# Patient Record
Sex: Male | Born: 1980
Health system: Southern US, Community
[De-identification: ages and names within clinical notes are randomized; demographics above are authoritative.]

## PROBLEM LIST (undated history)

## (undated) DIAGNOSIS — M1990 Unspecified osteoarthritis, unspecified site: Secondary | ICD-10-CM

## (undated) DIAGNOSIS — R918 Other nonspecific abnormal finding of lung field: Secondary | ICD-10-CM

## (undated) DIAGNOSIS — I776 Arteritis, unspecified: Secondary | ICD-10-CM

## (undated) DIAGNOSIS — J329 Chronic sinusitis, unspecified: Secondary | ICD-10-CM

## (undated) DIAGNOSIS — T7840XA Allergy, unspecified, initial encounter: Secondary | ICD-10-CM

## (undated) DIAGNOSIS — M791 Myalgia, unspecified site: Secondary | ICD-10-CM

## (undated) DIAGNOSIS — J36 Peritonsillar abscess: Secondary | ICD-10-CM

## (undated) DIAGNOSIS — N411 Chronic prostatitis: Secondary | ICD-10-CM

## (undated) HISTORY — DX: Allergy, unspecified, initial encounter: T78.40XA

## (undated) HISTORY — PX: OTHER SURGICAL HISTORY: SHX169

## (undated) HISTORY — DX: Arteritis, unspecified: I77.6

## (undated) HISTORY — DX: Chronic sinusitis, unspecified: J32.9

## (undated) HISTORY — DX: Unspecified osteoarthritis, unspecified site: M19.90

## (undated) HISTORY — DX: Chronic prostatitis: N41.1

## (undated) HISTORY — DX: Myalgia, unspecified site: M79.10

## (undated) HISTORY — DX: Peritonsillar abscess: J36

## (undated) HISTORY — DX: Other nonspecific abnormal finding of lung field: R91.8

## (undated) HISTORY — PX: TYMPANOPLASTY: SHX33

---

## 2009-09-25 ENCOUNTER — Ambulatory Visit: Payer: Self-pay | Admitting: Family Medicine

## 2009-09-25 DIAGNOSIS — J329 Chronic sinusitis, unspecified: Secondary | ICD-10-CM | POA: Insufficient documentation

## 2009-10-21 HISTORY — PX: BRONCHOSCOPY: SUR163

## 2009-12-18 ENCOUNTER — Ambulatory Visit: Payer: Self-pay | Admitting: Family Medicine

## 2009-12-18 DIAGNOSIS — J018 Other acute sinusitis: Secondary | ICD-10-CM

## 2009-12-27 ENCOUNTER — Telehealth: Payer: Self-pay | Admitting: Family Medicine

## 2010-01-05 ENCOUNTER — Encounter: Payer: Self-pay | Admitting: Internal Medicine

## 2010-01-22 ENCOUNTER — Ambulatory Visit: Payer: Self-pay | Admitting: Family Medicine

## 2010-01-25 LAB — CONVERTED CEMR LAB: IgE (Immunoglobulin E), Serum: 75.5 [IU]/mL

## 2010-02-12 ENCOUNTER — Telehealth: Payer: Self-pay | Admitting: Family Medicine

## 2010-02-15 ENCOUNTER — Telehealth: Payer: Self-pay | Admitting: Family Medicine

## 2010-03-01 ENCOUNTER — Ambulatory Visit: Payer: Self-pay | Admitting: Family Medicine

## 2010-03-01 DIAGNOSIS — J9801 Acute bronchospasm: Secondary | ICD-10-CM

## 2010-04-09 ENCOUNTER — Ambulatory Visit: Payer: Self-pay | Admitting: Internal Medicine

## 2010-04-09 ENCOUNTER — Telehealth (INDEPENDENT_AMBULATORY_CARE_PROVIDER_SITE_OTHER): Payer: Self-pay | Admitting: *Deleted

## 2010-04-09 DIAGNOSIS — J45909 Unspecified asthma, uncomplicated: Secondary | ICD-10-CM

## 2010-04-11 ENCOUNTER — Encounter: Payer: Self-pay | Admitting: Internal Medicine

## 2010-04-15 DIAGNOSIS — H6980 Other specified disorders of Eustachian tube, unspecified ear: Secondary | ICD-10-CM

## 2010-04-16 LAB — CONVERTED CEMR LAB
Basophils Absolute: 0 10*3/uL (ref 0.0–0.1)
Basophils Relative: 0.5 % (ref 0.0–3.0)
Eosinophils Absolute: 0.2 10*3/uL (ref 0.0–0.7)
Eosinophils Relative: 3.6 % (ref 0.0–5.0)
Hemoglobin: 16.1 g/dL (ref 13.0–17.0)
Lymphocytes Relative: 22.6 % (ref 12.0–46.0)
MCHC: 35.1 g/dL (ref 30.0–36.0)
Monocytes Relative: 7.4 % (ref 3.0–12.0)
Neutro Abs: 4.1 10*3/uL (ref 1.4–7.7)
Neutrophils Relative %: 65.9 % (ref 43.0–77.0)
RBC: 5.01 M/uL (ref 4.22–5.81)
WBC: 6.3 10*3/uL (ref 4.5–10.5)

## 2010-04-26 ENCOUNTER — Telehealth (INDEPENDENT_AMBULATORY_CARE_PROVIDER_SITE_OTHER): Payer: Self-pay | Admitting: *Deleted

## 2010-05-09 ENCOUNTER — Ambulatory Visit: Payer: Self-pay | Admitting: Internal Medicine

## 2010-05-25 ENCOUNTER — Telehealth (INDEPENDENT_AMBULATORY_CARE_PROVIDER_SITE_OTHER): Payer: Self-pay | Admitting: *Deleted

## 2010-05-25 ENCOUNTER — Ambulatory Visit: Payer: Self-pay | Admitting: Internal Medicine

## 2010-05-29 ENCOUNTER — Telehealth: Payer: Self-pay | Admitting: Internal Medicine

## 2010-05-29 ENCOUNTER — Ambulatory Visit: Payer: Self-pay | Admitting: Cardiology

## 2010-05-29 DIAGNOSIS — R930 Abnormal findings on diagnostic imaging of skull and head, not elsewhere classified: Secondary | ICD-10-CM

## 2010-05-29 LAB — CONVERTED CEMR LAB
Basophils Relative: 0.6 % (ref 0.0–3.0)
Calcium: 8.9 mg/dL (ref 8.4–10.5)
Creatinine, Ser: 0.9 mg/dL (ref 0.4–1.5)
Eosinophils Relative: 3.5 % (ref 0.0–5.0)
GFR calc non Af Amer: 101.27 mL/min (ref >60)
HCT: 44.4 % (ref 39.0–52.0)
Lymphocytes Relative: 15.6 % (ref 12.0–46.0)
Lymphs Abs: 1.2 10*3/uL (ref 0.7–4.0)
MCV: 90.5 fL (ref 78.0–100.0)
Monocytes Absolute: 0.5 10*3/uL (ref 0.1–1.0)
Neutrophils Relative %: 73.6 % (ref 43.0–77.0)
Potassium: 4.1 meq/L (ref 3.5–5.1)

## 2010-05-30 ENCOUNTER — Ambulatory Visit: Payer: Self-pay | Admitting: Internal Medicine

## 2010-05-31 ENCOUNTER — Telehealth: Payer: Self-pay | Admitting: Internal Medicine

## 2010-06-01 ENCOUNTER — Ambulatory Visit: Admission: RE | Admit: 2010-06-01 | Discharge: 2010-06-01 | Payer: Self-pay | Admitting: Internal Medicine

## 2010-06-01 ENCOUNTER — Ambulatory Visit: Payer: Self-pay | Admitting: Internal Medicine

## 2010-06-01 ENCOUNTER — Telehealth: Payer: Self-pay | Admitting: Internal Medicine

## 2010-06-08 ENCOUNTER — Telehealth: Payer: Self-pay | Admitting: Internal Medicine

## 2010-06-08 ENCOUNTER — Ambulatory Visit: Payer: Self-pay | Admitting: Internal Medicine

## 2010-06-08 DIAGNOSIS — R509 Fever, unspecified: Secondary | ICD-10-CM | POA: Insufficient documentation

## 2010-06-11 ENCOUNTER — Telehealth (INDEPENDENT_AMBULATORY_CARE_PROVIDER_SITE_OTHER): Payer: Self-pay | Admitting: *Deleted

## 2010-06-11 LAB — CONVERTED CEMR LAB
Basophils Absolute: 0 10*3/uL (ref 0.0–0.1)
Basophils Relative: 0.1 % (ref 0.0–3.0)
Bilirubin Urine: NEGATIVE
CO2: 36 meq/L — ABNORMAL HIGH (ref 19–32)
Chloride: 93 meq/L — ABNORMAL LOW (ref 96–112)
Creatinine, Ser: 0.9 mg/dL (ref 0.4–1.5)
Eosinophils Relative: 0.8 % (ref 0.0–5.0)
Glucose, Bld: 113 mg/dL — ABNORMAL HIGH (ref 70–99)
Hemoglobin: 16.4 g/dL (ref 13.0–17.0)
Lymphocytes Relative: 9.4 % — ABNORMAL LOW (ref 12.0–46.0)
Monocytes Relative: 3.1 % (ref 3.0–12.0)
Neutro Abs: 10.6 10*3/uL — ABNORMAL HIGH (ref 1.4–7.7)
Nitrite: NEGATIVE
RBC: 5.28 M/uL (ref 4.22–5.81)
RDW: 12.2 % (ref 11.5–14.6)
Total Protein, Urine: NEGATIVE mg/dL
WBC: 12.3 10*3/uL — ABNORMAL HIGH (ref 4.5–10.5)
pH: 7 (ref 5.0–8.0)

## 2010-06-21 ENCOUNTER — Telehealth (INDEPENDENT_AMBULATORY_CARE_PROVIDER_SITE_OTHER): Payer: Self-pay | Admitting: *Deleted

## 2010-06-21 ENCOUNTER — Encounter: Payer: Self-pay | Admitting: Family Medicine

## 2010-06-27 ENCOUNTER — Ambulatory Visit: Payer: Self-pay | Admitting: Internal Medicine

## 2010-06-27 DIAGNOSIS — K219 Gastro-esophageal reflux disease without esophagitis: Secondary | ICD-10-CM

## 2010-06-28 ENCOUNTER — Telehealth: Payer: Self-pay | Admitting: Internal Medicine

## 2010-06-28 DIAGNOSIS — IMO0001 Reserved for inherently not codable concepts without codable children: Secondary | ICD-10-CM | POA: Insufficient documentation

## 2010-06-28 LAB — CONVERTED CEMR LAB
ALT: 79 units/L — ABNORMAL HIGH (ref 0–53)
AST: 39 units/L — ABNORMAL HIGH (ref 0–37)
Albumin: 4 g/dL (ref 3.5–5.2)
Calcium: 9 mg/dL (ref 8.4–10.5)
Eosinophils Relative: 2.8 % (ref 0.0–5.0)
GFR calc non Af Amer: 150.35 mL/min (ref >60)
HCT: 44.2 % (ref 39.0–52.0)
Hemoglobin: 15.4 g/dL (ref 13.0–17.0)
Lymphs Abs: 1.3 10*3/uL (ref 0.7–4.0)
Monocytes Relative: 9.1 % (ref 3.0–12.0)
Neutro Abs: 6.6 10*3/uL (ref 1.4–7.7)
RDW: 14.2 % (ref 11.5–14.6)
Sodium: 140 meq/L (ref 135–145)
Total CK: 24 units/L (ref 7–232)
Total Protein: 7.7 g/dL (ref 6.0–8.3)
WBC: 8.9 10*3/uL (ref 4.5–10.5)

## 2010-07-02 ENCOUNTER — Telehealth (INDEPENDENT_AMBULATORY_CARE_PROVIDER_SITE_OTHER): Payer: Self-pay | Admitting: *Deleted

## 2010-07-03 LAB — CONVERTED CEMR LAB: Rhuematoid fact SerPl-aCnc: <20 intl units/mL (ref 0–20)

## 2010-07-05 ENCOUNTER — Encounter: Payer: Self-pay | Admitting: Internal Medicine

## 2010-07-06 ENCOUNTER — Telehealth: Payer: Self-pay | Admitting: Internal Medicine

## 2010-07-06 DIAGNOSIS — M3130 Wegener's granulomatosis without renal involvement: Secondary | ICD-10-CM | POA: Insufficient documentation

## 2010-07-06 DIAGNOSIS — M301 Polyarteritis with lung involvement [Churg-Strauss]: Secondary | ICD-10-CM | POA: Insufficient documentation

## 2010-07-24 ENCOUNTER — Ambulatory Visit: Payer: Self-pay | Admitting: Internal Medicine

## 2010-08-01 ENCOUNTER — Encounter: Payer: Self-pay | Admitting: Internal Medicine

## 2010-09-03 ENCOUNTER — Encounter: Payer: Self-pay | Admitting: Family Medicine

## 2010-10-01 ENCOUNTER — Encounter: Payer: Self-pay | Admitting: Family Medicine

## 2010-11-20 NOTE — Assessment & Plan Note (Signed)
Summary: Pulmonary/ acute ext ov with hfa teaching @ 75%   Copy to:  Nani Gasser Primary Provider/Referring Provider:  Nani Gasser MD  CC:  Dr. Maple Hudson patient. Here to discuss biopsy results. The patient c/o SOB with exertion and at rest and a dry cough..  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 sinus infection in February rx with prolonged abx thick green mucus no better except with prednsione but never took more than 5 days and variably stable for up to a month not consistent with any maint med x for allegra d.   Underwent FOB 8/12/11no dx, rx with avelox x 7 day and pred x 6 and minimally better while on it.  June 09, 2010 cc  SOB with exertion and at rest and a dry cough. Not taking any meds as directed by Dr Maple Hudson  only using allegra d  which he takes daily instead of as needed.  Low grade fever elminated on abx and pred, no fever now.   Pt denies any significant sore throat, dysphagia, itching, sneezing,  shaking  chills, sweats, pleuritic or exertional cp, hempoptysis  orthopnea pnd or leg swelling. Pt also denies any obvious fluctuation in symptoms with weather or environmental change or other alleviating or aggravating factors.   No rash, diarrhea, arthralgias.  LOST ABOUT 10 LBs since onset   Pt denies any use of  rescue therapy,  denies waking up needing it or having early am exacerbations of coughing/wheezing/ or dyspnea    Current Medications (verified): 1)  Proair Hfa 108 (90 Base) Mcg/act Aers (Albuterol Sulfate) .... Use With Aerochamber 2 Puffs Q 4 Hrs Prn 2)  Omnaris 50 Mcg/act Susp (Ciclesonide) .... 2 Sprays in Each Nostril Daily 3)  Allegra-D 24 Hour 180-240 Mg Xr24h-Tab (Fexofenadine-Pseudoephedrine) .... Take 1  Tablet By Mouth Once A Day 4)  Astepro 0.15 % Soln (Azelastine Hcl) .... One Spray in Each Nostril Once A Day in The Evening As Needed 5)  Symbicort 160-4.5 Mcg/act Aero (Budesonide-Formoterol Fumarate) .... 2 Puffs and Rinse Mouth, Twice Daily 6)  Singulair 10 Mg Tabs (Montelukast Sodium) .[name].. 1 Daily  Allergies (verified): 1)  Sulfa  Past History:  Past Medical History: Chronic prostatis chronic sinusitis       - Eval [facility] 6/20111 rx x 1 month clindamycin Allergic Rhinitis Asthma     - HFA 75% June 09, 2010   Pulmonary infiltrates     - FOB  06/01/10 nl airways, minimal inflammatory changes on tbbx, neg afb and fungal smears  Family History: Reviewed history from 04/09/2010 and no changes required. GF with MI Father with hi chol, Hashimoto's thyroiditis Heart disease-PGF  Social History: Reviewed history from 09/25/2009 and no changes required. 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 Married to [place].  Never Smoked Alcohol use-yes Drug use-no Regular exercise-no No caffeine.   Vital Signs:  Patient profile:   30 year old male Height:      79 inches (200.66 cm) Weight:      155.50 pounds (70.68 kg) BMI:     17.58 O2 Sat:      99 % on Room air Temp:     98.1 degrees F (36.72 degrees C) oral Pulse rate:   88 /  minute BP sitting:   130 / 84  (left arm) Cuff size:   regular  Vitals Entered By: Michel Bickers CMA (June 08, 2010 4:13 PM)  O2 Sat at Rest %:  99 O2 Flow:  Room air CC: Dr. Maple Hudson patient. Here to discuss biopsy results. The patient c/o SOB with exertion and at rest and a dry cough. Comments Medications reviewed. Daytime phone verified. Michel Bickers CMA  June 08, 2010 4:15 PM   Physical Exam  Additional Exam:  General: A/Ox3; pleasant and cooperative, NAD, tall thin habitus wt 157 > 155 June 08, 2010  SKIN: no rash, lesions NODES: no lymphadenopathy HEENT:  severe bilateral nonspecific turbinate edema with lots of dried  mucoid secretions, no ulcerations NECK: Supple w/ fair ROM, JVD- none, normal carotid impulses w/o bruits Thyroid- normal to palpation CHEST: Persistent dry cough, bilateral end-inspiratory wheeze, no rhonci  HEART: RRR, no m/g/r heard ABDOMEN: Soft and nl; No bruit UXL:KGMW, nl pulses, no edema  NEURO: Grossly intact to observation    White Cell Count     [H]  12.3 K/uL                   4.5-10.5     Manual smear review agrees with instrument differential.   Red Cell Count            5.28 Mil/uL                 4.22-5.81   Hemoglobin                16.4 g/dL                   10.2-72.5   Hematocrit                48.5 %                      39.0-52.0   MCV                       91.8 fl                     78.0-100.0   MCHC                      33.8 g/dL                   36.6-44.0   RDW                       12.2 %                      11.5-14.6   Platelet Count            361.0 K/uL                  150.0-400.0   Neutrophil %         [H]                              43.0-77.0       RESULT: 86.6 Repeated and verified X2. %   Lymphocyte %         [L]  9.4 %  12.0-46.0   Monocyte %                3.1 %                       3.0-12.0   Eosinophils%              0.8 %                       0.0-5.0   Basophils %               0.1 %                       0.0-3.0   Neutrophill Absolute [H]  10.6 K/uL                   1.4-7.7   Lymphocyte Absolute       1.2 K/uL                    0.7-4.0   Monocyte Absolute         0.4 K/uL                    0.1-1.0  Eosinophils, Absolute                             0.1 K/uL                    0.0-0.7   Basophils Absolute        0.0 K/uL                    0.0-0.1  Tests: (2) BMP (METABOL)   Sodium                    137 mEq/L                   135-145   Potassium                 4.5 mEq/L                   3.5-5.1   Chloride             [L]  93 mEq/L                    96-112   Carbon Dioxide       [H]  36 mEq/L                     19-32   Glucose              [H]  113 mg/dL                   16-10   BUN                       22 mg/dL                    9-60   Creatinine                0.9 mg/dL                   4.5-4.0   Calcium  9.2 mg/dL                   2.9-56.2   GFR                       105.15 mL/min               >60  Tests: (3) Sed Rate (ESR)   Sed Rate                  10 mm/hr                    0-22  Tests: (4) UDip Only (UDIP)   Color                     LT. YELLOW       RANGE:  Yellow;Lt. Yellow   Clarity                   CLEAR                       Clear   Specific Gravity          1.020                       1.000 - 1.030   Urine Ph                  7.0                         5.0-8.0   Protein                   NEGATIVE                    Negative   Urine Glucose             NEGATIVE                    Negative   Ketones                   NEGATIVE                    Negative   Urine Bilirubin           NEGATIVE                    Negative   Blood                     NEGATIVE                    Negative   Urobilinogen              0.2                         0.0 - 1.0   Leukocyte Esterace        NEGATIVE                    Negative   Nitrite                   NEGATIVE  Negative   Impression & Recommendations:  Problem # 1:  ASTHMA (ICD-493.90) Not clear at this point that he actually has any airflow obstruction though says in past inhalers have helped he just didn't understand how and when to [place] them  I spent extra time with the patient today explaining optimal mdi  technique.  This improved from  50-75%  Each maintenance medication was reviewed in detail including most importantly the difference between maintenance prns and under what circumstances the prns are to be used.  In addition, these two groups (for which the patient should keep up with refills) were distinguished from a third group :  meds that are used only short term with the intent to complete  a course of therapy and then not refill them.  The med list was then fully reconciled and reorganized to reflect this important distinction.   Problem # 2:  CT, CHEST, ABNORMAL (ICD-793.1)  Churg straus syndrome comes to mind but he does not have sign eosinphilia, sarcoid also but neg tbbx makes this much less likely. Lymphoma may need to be considered here.  HSP also.  both would probably need open lung bx for conclusive dx.  Whatever this is seems steroid responsive so will double the course of prednisone until Dr Maple Hudson has a chance to review his w/u and regroup with him in terms of whether open lung bx needed.  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  To help them reached the target tissue, the patient should use Afrin two puffs every 12 hours applied one min before using the nasal steroids.  Afrin should be stopped after no more than 5 days.  If the symptoms worsen, Afrin can be restarted after 5 days off of therapy to prevent rebound congestion from overuse of Afrin.  I also emphasized that in no way are nasal steroids a concern in terms of "addiction".    Medications Added to Medication List This Visit: 1)  Aciphex 20 Mg Tbec (Rabeprazole sodium) .... .take  one 30-60 min before first meal of the day 2)  Dulera 200-5 Mcg/act Aero (Mometasone furo-formoterol fum) .... 2 puffs first thing  in am and 2 puffs again in pm about 12 hours later 3)  Singulair 10 Mg Tabs (Montelukast sodium) .... One each evening 4)  Pepcid Ac Maximum Strength 20 Mg Tabs (Famotidine) .... One at bedtime 5)  Allegra-d 24 Hour 180-240 Mg Xr24h-tab (Fexofenadine-pseudoephedrine) .... Take 1 tablet by mouth once a day as needed 6)  Prednisone 10 Mg Tabs (Prednisone) .... 4 each am x 3 days,  3 x 3 days, 2x3 days, and 1x3 days  Other Orders: T- * Misc. Laboratory test [phone]) TLB-CBC Platelet - w/Differential  (85025-CBCD) TLB-BMP (Basic Metabolic Panel-BMET) (80048-METABOL) TLB-Sedimentation Rate (ESR) (85652-ESR) TLB-Udip ONLY (81003-UDIP) Est. Patient Level V (24235)  Patient Instructions: 1)  Dulera 2 puffs first thing  in am and 2 puffs again in pm about 12 hours later and out thru nose  2)  Work on perfecting  inhaler technique:  relax and blow all the way out then take a nice smooth deep breath back in, triggering the inhaler at same time you start breathing in and hold a few seconds 3)  Singulair 10 mg one each pm  4)  Omnaris one twice daily with aftrin x 5 days 5)  Prednisone 10mg  4 each am x 3 days,  3 x 3  days, 2x3 days, and 1x3 days  6)  Please schedule a follow-up appointment in 2  weeks, sooner if needed  with a cxr on return in return  7)  GERD (REFLUX)  is a common cause of respiratory symptoms. It commonly presents without heartburn and can be treated with medication, but also with lifestyle changes including avoidance of late meals, excessive alcohol, smoking cessation, and avoid fatty foods, chocolate, peppermint, colas, red wine, and acidic juices such as orange juice. NO MINT OR MENTHOL PRODUCTS SO NO COUGH DROPS  8)  USE SUGARLESS CANDY INSTEAD (jolley ranchers)  9)  NO OIL BASED VITAMINS  10)    Think of your medications in 3 separate categories and keep them separate:  11)  a  The ones you take no matter what daily on a scheduled basis 12)  b  The ones you only take if needed for specific problems 13)  c  The ones you take for a short course and stop, like antibiotics and prednisone. 14)     Prescriptions: PREDNISONE 10 MG TABS (PREDNISONE) 4 each am x 3 days,  3 x 3 days, 2x3 days, and 1x3 days  #30 x 0   Entered and Authorized by:   Nyoka Cowden MD   Signed by:   Nyoka Cowden MD on 06/08/2010   Method used:   Electronically to        [facility]* (retail)       [address] [place], [place]  [address]       Ph: [phone]       Fax: [phone]    RxID:   [id number]   Appended Document: Pulmonary/ acute ext ov with hfa teaching @ 75% Pt has f/u ov with Dr Maple Hudson scheduled first week in September with cxr then - I will contact him 8/22 to see if he's better and if not need f/u week of 8/22 with cxr

## 2010-11-20 NOTE — Progress Notes (Signed)
Summary: returned call  Phone Note Outgoing Call Call back at Work Phone [phone]   Summary of Call: [facility] @ work number. ACE level is pending. Need to discuss next steps. Initial call taken by: Waymon Budge MD,  May 31, 2010 8:58 AM  Follow-up for Phone Call        please call pt back at his work number.  He returned call to Dr. Maple Hudson. Follow-up by: Eugene Gavia,  May 31, 2010 11:56 AM  Additional Follow-up for Phone Call Additional follow up Details #1::        I told him ACE level normal- against active sarcoid. Shared the opinions that came from looking at his CT with the group at Thoracic Oncology conference this morning recommending bronchoscopy for tbbx as next step. i carefully discussed risks as usually presented, up to and including failure to diagnose, bleed, PTX/ chest tube, life support/ intubation. He is not on aspirin. We will see if possible to schedule tomorrow, otw after i get back from vacation next week. Additional Follow-up by: Waymon Budge MD,  May 31, 2010 12:19 PM

## 2010-11-20 NOTE — Assessment & Plan Note (Signed)
Summary: SINUS ISSUE   Vital Signs:  Patient profile:   30 year old male Height:      78.1 inches Weight:      161 pounds O2 Sat:      98 % on Room air Temp:     97.8 degrees F oral Pulse rate:   64 / minute BP sitting:   108 / 64  (left arm) Cuff size:   regular  Vitals Entered By: Kathlene November (January 22, 2010 10:49 AM)  O2 Flow:  Room air CC: nasal and sinus congestion-    Primary Care Provider:  Nani Gasser MD  CC:  nasal and sinus congestion- .  History of Present Illness: nasal and sinus congestion- while traveling. Was given a NEB tx and a steroid shot.  Given a rx for Levaquin, Allegra, patanase adn methylprenisolone.  Felt like a heaviness in his breathing.  Had a Sinus CT that showed sinusitis bilat.  no HA.  Still has thick dark mucous adn inflammation.  Stopped using the flonase and didn't really notice a difference. Seems worse in the evening.  Finishd hte levaquin about 7 days again. Took a 14 day course.   Current Medications (verified): 1)  Ventolin Hfa Inhaler 8 Gram .... 2-4 Puff Inhaled Every 4-6 Hours As Needed. 2)  Patanase 0.6 % Soln (Olopatadine Hcl) .... Use One Spray Twice A Day As Needed 3)  Allegra 180 Mg Tabs (Fexofenadine Hcl) .... Take One Tablet By Mouth Once A Day  Allergies (verified): 1)  Sulfa  Comments:  Nurse/Medical Assistant: The patient's medications and allergies were reviewed with the patient and were updated in the Medication and Allergy Lists. Kathlene November (January 22, 2010 10:52 AM)  Physical Exam  General:  Well-developed,well-nourished,in no acute distress; alert,appropriate and cooperative throughout examination Head:  Normocephalic and atraumatic without obvious abnormalities. No apparent alopecia or balding. Eyes:  No corneal or conjunctival inflammation noted. EOMI. Perrla. Ears:  External ear exam shows no significant lesions or deformities.  Otoscopic examination reveals clear canals, tympanic membranes are intact  bilaterally without bulging, retraction, inflammation or discharge. Hearing is grossly normal bilaterally. Nose:  External nasal examination shows no deformity or inflammation.  Mouth:  Oral mucosa and oropharynx without lesions or exudates.  Teeth in good repair. Neck:  No deformities, masses, or tenderness noted. Lungs:  Normal respiratory effort, chest expands symmetrically. Lungs are clear to auscultation, no crackles or wheezes. Heart:  Normal rate and regular rhythm. S1 and S2 normal without gallop, murmur, click, rub or other extra sounds. Skin:  no rashes.   Cervical Nodes:  No lymphadenopathy noted Psych:  Cognition and judgment appear intact. Alert and cooperative with normal attention span and concentration. No apparent delusions, illusions, hallucinations   Impression & Recommendations:  Problem # 1:  ALLERGIC RHINITIS (ICD-477.9) Discussed trial of omnaris since he feels the fluticasone rolls down his throat even with forward head tilt.  Continue the allera and will add astepro. Samples given. If not better in 1 week then let me know and will refer to ENT. He would alos like allergy testing.  Discussed serum vs patch testing. He woulodlike to start with the serum testing.  He would also like food allergy testing.  The following medications were removed from the medication list:    Fluticasone Propionate 50 Mcg/act Susp (Fluticasone propionate) .[name]... 2 sprays in each  nostril. His updated medication list for this problem includes:    Omnaris 50 Mcg/act Susp (Ciclesonide) .Marland KitchenMarland KitchenMarland KitchenMarland Kitchen  2 sprays in each nostril daily    Allegra 180 Mg Tabs (Fexofenadine hcl) .[name]... Take one tablet by mouth once a day    Astepro 0.15 % Soln (Azelastine hcl) ..... One spray in each nostril once a day in the evening.  Orders: T- * Misc. Laboratory test [id number]) T- * Misc. Laboratory test [phone])  Problem # 2:  OTHER ACUTE SINUSITIS (ICD-461.8) Seems to be resolved but i think he his having residual allergy  sxs.  The following medications were removed from the medication list:    Fluticasone Propionate 50 Mcg/act Susp (Fluticasone propionate) .[name]... 2 sprays in each  nostril.    Augmentin 875-125 Mg Tabs (Amoxicillin-pot clavulanate) .[name]... Take 1 tablet by mouth two times a day for 14 days. His updated medication list for this problem includes:    Omnaris 50 Mcg/act Susp (Ciclesonide) .[name]... 2 sprays in each nostril daily    Astepro 0.15 % Soln (Azelastine hcl) ..... One spray in each nostril once a day in the evening.  Orders: T- * Misc. Laboratory test [phone])  Complete Medication List: 1)  Ventolin Hfa Inhaler 8 Gram  .... 2-4 puff inhaled every 4-6 hours as needed. 2)  Omnaris 50 Mcg/act Susp (Ciclesonide) .... 2 sprays in each nostril daily 3)  Allegra 180 Mg Tabs (Fexofenadine hcl) .... Take one tablet by mouth once a day 4)  Astepro 0.15 % Soln (Azelastine hcl) .... One spray in each nostril once a day in the evening.

## 2010-11-20 NOTE — Assessment & Plan Note (Signed)
Summary: Sinusitis   Vital Signs:  Patient profile:   30 year old male Height:      78.1 inches Weight:      161 pounds O2 Sat:      96 % on Room air Temp:     97.4 degrees F oral Pulse rate:   71 / minute BP sitting:   109 / 60  (left arm) Cuff size:   regular  Vitals Entered By: Kathlene November (December 18, 2009 10:44 AM)  O2 Flow:  Room air CC: sinus dainage, some pressure off and on- feels like sinus swelling   Primary Care Provider:  Nani Gasser MD  CC:  sinus dainage and some pressure off and on- feels like sinus swelling.  History of Present Illness: sinus dainage, some pressure off and on- feels like sinus swelling.  Similar to sxs back in December. STarted teh the flonase.  Maybe helped for a few days.  Still feels verycongested. Also tried zyrtec made his secretions like cement.  Eyes have been watering. zyrtec does help. some. Still having sinus HA.  Has dark mucous.  Will be traveling alot over the next months.  Did have a cold in January which he feels set him back.  Did use Affrrin for about a week but off now.   Current Medications (verified): 1)  Fluticasone Propionate 50 Mcg/act Susp (Fluticasone Propionate) .... 2 Sprays in Each  Nostril. 2)  Ventolin Hfa Inhaler 8 Gram .... 2-4 Puff Inhaled Every 4-6 Hours As Needed.  Allergies (verified): 1)  Sulfa  Comments:  Nurse/Medical Assistant: The patient's medications and allergies were reviewed with the patient and were updated in the Medication and Allergy Lists. Kathlene November (December 18, 2009 10:45 AM)  Past History:  Past Surgical History: Last updated: 09/25/2009 Tympanoplasty 2003, left ear.    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  Never Smoked Alcohol use-yes Drug use-no Regular exercise-no No caffeine.   Physical Exam  General:  Well-developed,well-nourished,in no acute distress; alert,appropriate and  cooperative throughout examination Head:  Normocephalic and atraumatic without obvious abnormalities. No apparent alopecia or balding. Eyes:  No corneal or conjunctival inflammation noted. EOMI. Perrla.  Ears:  External ear exam shows no significant lesions or deformities.  Right TM with large scar.  Nose:  External nasal examination shows no deformity or inflammation. Nasal mucosa are pink and moist without lesions. Yellow drianage.  Mouth:  Oral mucosa and oropharynx without lesions or exudates.  Teeth in good repair. Neck:  No deformities, masses, or tenderness noted. Lungs:  "squeak" at the end fo inspiration diffusely.   Heart:  Normal rate and regular rhythm. S1 and S2 normal without gallop, murmur, click, rub or other extra sounds. Skin:  no rashes.   Cervical Nodes:  No lymphadenopathy noted Psych:  Cognition and judgment appear intact. Alert and cooperative with normal attention span and concentration. No apparent delusions, illusions, hallucinations   Impression & Recommendations:  Problem # 1:  OTHER ACUTE SINUSITIS (ICD-461.8) Since really not much better since December will treat with ABX. If not better in 2 weeks then let me know. Contiue the fluticasone for now.   His updated medication list for this problem includes:    Fluticasone Propionate 50 Mcg/act Susp (Fluticasone propionate) .[name]... 2 sprays in each  nostril.    Augmentin 875-125 Mg Tabs (Amoxicillin-pot clavulanate) .[name]... Take 1 tablet by mouth two times a day for 14 days.  Instructed on treatment. Call if symptoms persist or worsen.   Complete Medication List: 1)  Fluticasone Propionate 50 Mcg/act Susp (Fluticasone propionate) .... 2 sprays in each  nostril. 2)  Ventolin Hfa Inhaler 8 Gram  .... 2-4 puff inhaled every 4-6 hours as needed. 3)  Augmentin 875-125 Mg Tabs (Amoxicillin-pot clavulanate) .... Take 1 tablet by mouth two times a day for 14 days. Prescriptions: AUGMENTIN 875-125 MG TABS (AMOXICILLIN-POT  CLAVULANATE) Take 1 tablet by mouth two times a day for 14 days.  #28 x 0   Entered and Authorized by:   Nani Gasser MD   Signed by:   Nani Gasser MD on 12/18/2009   Method used:   Electronically to        [facility]* (retail)       [address] [place], [place]  [address]       Ph: [phone]       Fax: [phone]   RxID:   [id number]

## 2010-11-20 NOTE — Progress Notes (Signed)
Summary: muscle pain  Phone Note Call from Patient   Caller: Patient Call For: YOUNG Summary of Call: Pain in muscles . He wants  to no if coming off of prednisone would have anything to do with it. [facility] [address] [place]    Initial call taken by: [name],  June 21, 2010 8:59 AM  Follow-up for Phone Call        Pt c/o having pain in his legs that he describes as a cramping sensation x 1 day. Pt is taking prednisone 10mg  taper and today will be his last day of the taper. He states he does not want anymore steroids at this time. He wants to know what you think could be causing the cramps. Pt has appt on 06-27-10 with Dr. Maple Hudson. Carron Curie CMA  June 21, 2010 10:31 AM ALLERGIES: SULFA   Additional Follow-up for Phone Call Additional follow up Details #1::        This is almost certainly from steroids.  Heat, gentle stretching and tylenol or advil are usually sufficient. It should gradually clear.  I will be happy to speak to him directly if needed. How is his chest feeling?[name] Additional Follow-up by: Waymon Budge MD,  June 21, 2010 12:02 PM    Additional Follow-up for Phone Call Additional follow up Details #2::    pt aware of dr Roxy Cedar response and is fine with this will let us know if symptoms do not clear and pt was also advised to keep appt on 06/27/10, as far as his chest symptoms that is much better and feels like it has cleared. Follow-up by: Philipp Deputy CMA,  June 21, 2010 2:14 PM

## 2010-11-20 NOTE — Miscellaneous (Signed)
Summary: Orders Update pft charges  Clinical Lists Changes  Orders: Added new Service order of Carbon Monoxide diffusing w/capacity (94720) - Signed Added new Service order of Lung Volumes (94240) - Signed Added new Service order of Spirometry (Pre & Post) (94060) - Signed 

## 2010-11-20 NOTE — Progress Notes (Signed)
Summary: pt doing better- will f/u w/ Dr Maple Hudson in Sept  ---- Converted from flag ---- ---- 06/10/2010 10:11 AM, Nyoka Cowden MD wrote: call him and see if feeling better if not needs ov with cxr today ------------------------------  Called and spoke with pt.  Pt states doing much better.  SOB resolved.  I advised that he keep planned followup with Dr Maple Hudson for 06/27/10 and to call for sooner appt if starts to worsen again.  Pt verbalized understading. Vernie Murders  June 11, 2010 9:35 AM

## 2010-11-20 NOTE — Progress Notes (Signed)
Summary: tightness in chest > rec ov  Phone Note Call from Patient   Caller: Patient Call For: young Summary of Call: tightness in chest would like to no if someone could review biopsy. would like prednisone called to pharmacy. [name] Initial call taken by: [name],  June 08, 2010 2:40 PM  Follow-up for Phone Call        Spoke with pt-SOB, wheezing, cough, ? inflammatory issues. Pt doesnt want to go through the weekend not knowing the results of the biopsy. Will have MW or PW call pt and assess what should be done next as CDY is out of the office until Monday. Pt request prednisone if possible.[name] CMA  June 08, 2010 3:16 PM

## 2010-11-20 NOTE — Progress Notes (Signed)
Summary: rx / pharm calling  Phone Note From Pharmacy   Caller: emily w/ [facility] in [place] Call For: young  Summary of Call: caller states that pt is waiting for rx for an abx. [facility] [address] [phone] Initial call taken by: Tivis Ringer, CNA,  June 01, 2010 4:06 PM  Follow-up for Phone Call        Pls advise if abx was supposed to be sent for pt, thanks! Vernie Murders  June 01, 2010 4:12 PM   Additional Follow-up for Phone Call Additional follow up Details #1::        Per CDY-give pt Avelox 400mg  #7 take 1 by mouth daily no refills.[name] CMA  June 01, 2010 4:31 PM     Additional Follow-up for Phone Call Additional follow up Details #2::    called and spoke with pharmacy--[facility] and they are aware of avelox 400mg    #7  1 by mouth daily until gone per CY Randell Loop CMA  June 01, 2010 4:35 PM   New/Updated Medications: AVELOX 400 MG TABS (MOXIFLOXACIN HCL) take one tablet by mouth daily until gone Prescriptions: AVELOX 400 MG TABS (MOXIFLOXACIN HCL) take one tablet by mouth daily until gone  #7 x 0   Entered by:   Randell Loop CMA   Authorized by:   Waymon Budge MD   Signed by:   Randell Loop CMA on 06/01/2010   Method used:   Electronically to        [facility]* (retail)       [address] [place], [place]  [address]       Ph: [phone]       Fax: [phone]   RxID:   [id number]

## 2010-11-20 NOTE — Assessment & Plan Note (Signed)
Summary: 3 weeks/apc   Copy to:  Jesse Wong Primary Provider/Referring Provider:  Nani Gasser MD  CC:  3 week follow up visit- "feelong better"; still having nasal congestion/drainage.Jesse Wong  History of Present Illness: May 25, 2010- Asthma/ chronic cough, allergic rhinitis Inhalers help only transiently. Mild fever- 100 at night, only a few sweats, no chills and no purulence, nodes or rash. Dry cough or scant clear mucus. Chest and throat tired/ sore from cough. No environmental factors.  More aware of reflux because he has been paying attention to it.  June 27, 2010- Asthma/ Infiltrates/ cough, rhinosinusitis Bronchoscopy had shown only nonspecific inflammation with all cultures and smears for organisms negative to date. He took prednisone and avelox. Dr Sherene Sires saw him while I was away and reviewed results, repeated a prednisone taper, but had no different insight based on my verbal discussions with him. I have reviewed status twice with images available at [facility] conference. Dr Lazarus Salines saw him on 06/21/10 after a course of clindamycin with substantial improvement since the pansinusitis noited in March on outside study. [name] tapered off of prednisone finally as onf 06/21/10 and reported myalgias then- suspected to be due to steroid withdrawal, but raising question of a myositis.  Walking at conference in July, legs and feet hurt. Prednisone relieved this. If  not on high dose Tylenol or Aleve he "would be going to ER for pain 7/10". Feels shakey and weak. Legs and back of arms. Denies rash, hot joints, glands fever. Chest feels pretty good. Feels inflammation conming back in nose.   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 Dr Dareen Piano is treating as a vasculitis, but reportedly specific markers other than ANCA are not elevated. He still has some nasal congestion and wonders about peristent seasonal  allergy. We discussed the concepts of allergy, asthma and the vasculitis. Explained that this immunotherapy should cover usual environmental allergy pattern.  He is beginning agan to feel myalgias.    Asthma History    Asthma Control Assessment:    Age range: 12+ years    Symptoms: 0-2 days/week    Nighttime Awakenings: 0-2/month    Interferes w/ normal activity: no limitations    SABA use (not for EIB): 0-2 days/week    Asthma Control Assessment: Well Controlled   Preventive Screening-Counseling & Management  Alcohol-Tobacco     Alcohol drinks/day: <1     Smoking Status: never  Current Medications (verified): 1)  Aciphex 20 Mg Tbec (Rabeprazole Sodium) .... .take  One 30-60 Min Before First Meal of The Day 2)  Omnaris 50 Mcg/act Susp (Ciclesonide) .... 2 Sprays in Each Nostril Daily 3)  Dulera 200-5 Mcg/act Aero (Mometasone Furo-Formoterol Fum) .... 2 Puffs First Thing  in Am and 2 Puffs Again in Pm About 12 Hours Later 4)  Singulair 10 Mg Tabs (Montelukast Sodium) .... One Each Evening 5)  Pepcid Ac Maximum Strength 20 Mg Tabs (Famotidine) .... One At Bedtime 6)  Proair Hfa 108 (90 Base) Mcg/act Aers (Albuterol Sulfate) .... Use With Aerochamber 2 Puffs Q 4 Hrs Prn 7)  Allegra-D 24 Hour 180-240 Mg Xr24h-Tab (Fexofenadine-Pseudoephedrine) .... Take 1 Tablet By Mouth Once A Day As Needed 8)  Astepro 0.15 % Soln (Azelastine Hcl) .... One Spray in Each Nostril Once A Day in The Evening As Needed 9)  Tramadol Hcl 50 Mg Tabs (Tramadol Hcl) .Jesse Wong.. 1-2 Four Times A Day As Needed Pain 10)  Oxycodone-Acetaminophen 5-325  Mg Tabs (Oxycodone-Acetaminophen) .Jesse Wong.. 1 or 2 Every 6 Hours If Needed For Severe Pain 11)  Azathioprine 50 Mg Tabs (Azathioprine) .... Take 2 By Mouth Once Daily 12)  Prednisone 20 Mg Tabs (Prednisone) .... Take 3 By Mouth Once Daily 13)  Dapsone 100 Mg Tabs (Dapsone) .... 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   Peritonsilar abscess drained after antibiotics 2007 Bronchoscopy -TBBX 2011- inflammation  Social History: Land at [employer].  PH. D . Married to [place].  Never Smoked Alcohol use-yes Drug use-no Regular exercise-no No caffeine.   Review of Systems      See HPI       The patient complains of shortness of breath with activity.  The patient denies shortness of breath at rest, productive cough, non-productive cough, coughing up blood, chest pain, irregular heartbeats, acid heartburn, indigestion, loss of appetite, weight change, abdominal pain, difficulty swallowing, sore throat, tooth/dental problems, headaches, sneezing, itching, ear ache, rash, change in color of mucus, and fever.         myalgias  Vital Signs:  Patient profile:   30 year old male Height:      79 inches Weight:      161.50 pounds BMI:     18.26 O2 Sat:      92 % on Room air Pulse rate:   95 / minute BP sitting:   118 / 78  (left arm) Cuff size:   regular  Vitals Entered By: [name] CMA (July 24, 2010 3:24 PM)  O2 Flow:  Room air CC: 3 week follow up visit- "feelong better"; still having nasal congestion/drainage.   Physical Exam  Additional Exam:  General: A/Ox3; pleasant and cooperative, NAD, tall thin habitus wt 157 > 155 June 08, 2010 > 153.4 June 27, 2010  SKIN: no rash, lesions NODES: no  lymphadenopathy HEENT:  Mucosa looks better, no conjunctival injection  NECK: Supple w/ fair ROM, JVD- none, normal carotid impulses w/o bruits Thyroid-  CHEST: Clear to P&A HEART: RRR, no m/g/r heard ABDOMEN: Soft and nl; No bruit GUY:QIHK, nl pulses, no edema .  NEURO: Grossly intact to observation      Impression & Recommendations:  Problem # 1:  ASTHMA (ICD-493.90) How much is asthma? We discussed initial presentation of this disease in the sinuses, with progression to involve the chest. Nodular infiltrates on images are not typical of asthma, even ABPA. I said he could continue with inhaled bronchodilators but I doubt that inhaled steroids will ad much to his systemic steroids.   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 I recommended that he discuss this with Dr Dareen Piano. I will have him try a different nasal antiinflammatory. It won't hurt and might help, since nasal congestion, epistaxis and crusting continue at a lower level,  - Nasalcrom  My vote would be to complete the course he is on now. If it flares again, then consider VATs lung biopsy before going back on immunosuppression. His updated medication list for this problem includes:    Omnaris 50 Mcg/act Susp (Ciclesonide) .Jesse Wong... 2 sprays in each nostril daily    Astepro 0.15 % Soln (Azelastine hcl) ..... 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  Medications Added to Medication List This Visit: 1)  Azathioprine 50 Mg Tabs (Azathioprine) .... Take 2 by mouth once daily 2)  Prednisone 20 Mg Tabs (Prednisone) .... Take 3 by mouth once daily 3)  Dapsone 100 Mg Tabs (Dapsone) .... Take 1 by mouth once daily  Other Orders: Est. Patient  Level III (40347)  Patient Instructions: 1)  Please schedule a follow-up appointment as needed. 2)  Consider trying otc nasal spray Nasalcrom/ cromolyn

## 2010-11-20 NOTE — Letter (Signed)
Summary: [facility]   Imported By: Maryln Gottron 09/24/2010 15:53:28  _____________________________________________________________________  External Attachment:    Type:   Image     Comment:   External Document

## 2010-11-20 NOTE — Progress Notes (Signed)
Summary: re: CT  Phone Note Call from Patient Call back at Home Phone [phone]   Caller: Patient Call For: young Summary of Call: pt called to leave info for [facility]. pt says that his last CT was done at [facility] [place] [phone] Initial call taken by: Tivis Ringer, CNA,  April 09, 2010 4:17 PM  Follow-up for Phone Call        Marcello Fennel- please get [place] a copy of that report and also a disk if available. thanks Follow-up by: Waymon Budge MD,  April 15, 2010 12:44 PM  Additional Follow-up for Phone Call Additional follow up Details #1::        Copy has been requested and faxed to our office; in CDY's look at.[name] CMA  May 03, 2010 11:34 AM

## 2010-11-20 NOTE — Progress Notes (Signed)
Summary: Still sick and in [place]  Phone Note Call from Patient Call back at [phone]   Caller: Patient Call For: Nani Gasser MD Summary of Call: [place]of [place] and said you wanted to know how antibiotic was working. Pt states hasn't seen any changes while on the antibiotic. Difficult to take a deep breathe. Does not have the rescue inhaler with him. Nasal and sinus congestion, coughing alot. Would like to know what he should do go to ED or see an urgent care- he is [place]. Initial call taken by: Kathlene November,  December 27, 2009 8:10 AM  Follow-up for Phone Call        Rec go to [facility].  Teh ABX has really great coverage for sinusitis so needs to be re-eval.  Follow-up by: Nani Gasser MD,  December 27, 2009 8:12 AM  Additional Follow-up for Phone Call Additional follow up Details #1::        Pt notified of above instructions. Additional Follow-up by: Kathlene November,  December 27, 2009 9:45 AM

## 2010-11-20 NOTE — Progress Notes (Signed)
Summary: returned call  Phone Note Call from Patient Call back at Home Phone [phone]   Caller: Patient Call For: young Summary of Call: pt returned call from [place].  Initial call taken by: Tivis Ringer, CNA,  May 29, 2010 3:06 PM  Follow-up for Phone Call        Pt aware of results and aware of labs to be done; will come to our office on Wednesday to have ACE level drawn; order in IDX.[name] CMA  May 29, 2010 4:49 PM

## 2010-11-20 NOTE — Progress Notes (Signed)
Summary: Phone Dr Fawn Kirk Granulomatosis  Phone Note Other Incoming   Summary of Call: Call from Dr Azzie Roup. When i first spoke to him the P-ANCA hadn't returned. 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 Results sent to Dr Dareen Piano. 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

## 2010-11-20 NOTE — Progress Notes (Signed)
Summary: sob  Phone Note Call from Patient Call back at Home Phone [phone]   Caller: Patient Call For: young Reason for Call: Talk to Nurse Summary of Call: trouble breathing x 1 week, airway irritated, trouble catching breath pretty much all the time.  cough, persistant and intence.  Would like to see if you can suggest something. Initial call taken by: Eugene Gavia,  May 25, 2010 9:02 AM  Follow-up for Phone Call        Spoke with pt.  He states that he has had persistant dry cough- worsening over the past wk.  He states that he is SOB also- esp with coughing.  Appt sched with Dr Maple Hudson for 3:30 pm. Follow-up by: Vernie Murders,  May 25, 2010 9:11 AM

## 2010-11-20 NOTE — Letter (Signed)
Summary: [facility]   Imported By: Sherian Rein 08/16/2010 08:22:14  _____________________________________________________________________  External Attachment:    Type:   Image     Comment:   External Document

## 2010-11-20 NOTE — Assessment & Plan Note (Signed)
Summary: allergy testing/apc   Vital Signs:  Patient profile:   30 year old male Height:      79 inches Weight:      153.25 pounds BMI:     17.33 O2 Sat:      99 % on Room air Pulse rate:   83 / minute BP sitting:   132 / 76  (right arm) Cuff size:   regular  Vitals Entered By: [name] CMA (June 27, 2010 3:41 PM)  O2 Flow:  Room air CC: Allergy Testing   Copy to:  Nani Gasser Primary Provider/Referring Provider:  Nani Gasser MD  CC:  Allergy Testing.  History of Present Illness: May 09, 2010- Asthma, allergic rhinosinusitis CT sinus- images reviewed- looks clear- Need report. Has has 1 more week of clindamicin for Dr Lazarus Salines.Still has bothersome nasal congestion despite Allegra-D. PFT.- mild restriction reflecting his tall, slender body build. Symbicort seems to help- lungs feel more open and stable with less cough and throat tightness. Uses Proair 1-2x daily for sustained cough. Tried Singulair- no help for rhinitis, but he isn't sure if it helped his dyspnea.[name] He feels episodes of cough. Dyspnea at times lying in bed.  May 25, 2010- Asthma/ chronic cough, allergic rhinitis Inhalers help only transiently. Mild fever- 100 at night, only a few sweats, no chills and no purulence, nodes or rash. Dry cough or scant clear mucus. Chest and throat tired/ sore from cough. No environmental factors.  More aware of reflux because he has been paying attention to it.  June 27, 2010- Asthma/ Infiltrates/ cough, rhinosinusitis Bronchoscopy had shown only nonspecific inflammation with all cultures and smears for organisms negative to date. He took prednisone and avelox. Dr Sherene Sires saw him while I was away and reviewed results, repeated a prednisone taper, but had no different insight based on my verbal discussions with him. I have reviewed status twice with images available at [facility] conference. Dr Lazarus Salines saw him on 06/21/10 after a course of clindamycin with  substantial improvement since the pansinusitis noited in March on outside study. Mr Hagood tapered off of prednisone finally as onf 06/21/10 and reported myalgias then- suspected to be due to steroid withdrawal, but raising question of a myositis.  Walking at conference in July, legs and feet hurt. Prednisone relieved this. If  not on high dose Tylenol or Aleve he "would be going to ER for pain 7/10". Feels shakey and weak. Legs and back of arms. Denies rash, hot joints, glands fever. Chest feels pretty good. Feels inflammation conming back in nose.         Preventive Screening-Counseling & Management  Alcohol-Tobacco     Alcohol drinks/day: <1     Smoking Status: never  Current Medications (verified): 1)  Aciphex 20 Mg Tbec (Rabeprazole Sodium) .... .take  One 30-60 Min Before First Meal of The Day 2)  Omnaris 50 Mcg/act Susp (Ciclesonide) .... 2 Sprays in Each Nostril Daily 3)  Dulera 200-5 Mcg/act Aero (Mometasone Furo-Formoterol Fum) .... 2 Puffs First Thing  in Am and 2 Puffs Again in Pm About 12 Hours Later 4)  Singulair 10 Mg Tabs (Montelukast Sodium) .... One Each Evening 5)  Pepcid Ac Maximum Strength 20 Mg Tabs (Famotidine) .... One At Bedtime 6)  Proair Hfa 108 (90 Base) Mcg/act Aers (Albuterol Sulfate) .... Use With Aerochamber 2 Puffs Q 4 Hrs Prn 7)  Allegra-D 24 Hour 180-240 Mg Xr24h-Tab (Fexofenadine-Pseudoephedrine) .... Take 1 Tablet By Mouth Once A Day As  Needed 8)  Astepro 0.15 % Soln (Azelastine Hcl) .... One Spray in Each Nostril Once A Day in The Evening As Needed  Allergies (verified): 1)  Sulfa  Past History:  Past Medical History: Peritonsilar abscess, hx Chronic prostatis chronic sinusitis       - pansinusitis 12/2009 CT       - Eval [facility] 6/20111 rx x 1 month clindamycin       - CT sinus almost clear- Dr Lazarus Salines 06/21/10       Allergic Rhinitis Asthma     - HFA 75% June 09, 2010   Pulmonary infiltrates     - CXR 6/11 progressive on CT, incl infil/  nodule     - FOB  06/01/10 nl airways, minimal inflammatory changes on tbbx, neg afb and fungal smears Myalgias- 03/2010  Past Surgical History: Tympanoplasty 2003, left ear.   Peritonsilar abscess drained after antibiotics 2007  Review of Systems      See HPI       The patient complains of weight loss, dyspnea on exertion, and muscle weakness.  The patient denies anorexia, fever, weight gain, vision loss, decreased hearing, hoarseness, chest pain, syncope, peripheral edema, prolonged cough, headaches, hemoptysis, abdominal pain, melena, severe indigestion/heartburn, suspicious skin lesions, transient blindness, abnormal bleeding, enlarged lymph nodes, and angioedema.    Physical Exam  Additional Exam:  General: A/Ox3; pleasant and cooperative, NAD, tall thin habitus wt 157 > 155 June 08, 2010 > 153.4 June 27, 2010  SKIN: no rash, lesions NODES: no lymphadenopathy HEENT:  Mucosa looks better, no conjunctival injection  NECK: Supple w/ fair ROM, JVD- none, normal carotid impulses w/o bruits Thyroid-  CHEST: Few bibaslar wet rhonchi, no wheeze or rub HEART: RRR, no m/g/r heard ABDOMEN: Soft and nl; No bruit EVO:JJKK, nl pulses, no edema . Tender to pressure NEURO: Grossly intact to observation      Impression & Recommendations:  Problem # 1:  CT, CHEST, ABNORMAL (ICD-793.1)  He began noting progressively painful myalgias in June or July, which is about when his CXR picture began to  progress. Sinus symproms were better after prednisone and antibiotics- probably the steroids. We both want him to stay off steroids if possible. We discussed possibility of an immune mediated disorder and will refer freely as needed. Discussed possible VATS lung bx. For now we will treat pain initially with tramadol and send lab test batteries looking for inflammation, vasculitis, myositis, end organ damage. He has done white water rafting, gone to petting zoos in the [place]. He denies HIV risk  activites.  Problem # 2:  GERD (ICD-530.81) He noted heartburn while on prednisone and was put on Aciphex plus Pepcid. He is out of Aciphex so I suggested he run out and quit aciphex while continuing pepcid for now. His updated medication list for this problem includes:    Aciphex 20 Mg Tbec (Rabeprazole sodium) ..... Marland Kitchentake  one 30-60 min before first meal of the day    Pepcid Ac Maximum Strength 20 Mg Tabs (Famotidine) ..... One at bedtime  Problem # 3:  ASTHMA (ICD-493.90) Wheeze has not been prominent and use of SABA is not an active cponcern now. That is pertinent also to odds that this might be a Churg-Straus vasculitis where asthma is usually important early.  Medications Added to Medication List This Visit: 1)  Tramadol Hcl 50 Mg Tabs (Tramadol hcl) .[name].. 1-2 four times a day as needed pain  Other Orders: Est. Patient Level III (93818) TLB-CBC  Platelet - w/Differential (85025-CBCD) TLB-Hepatic/Liver Function Pnl (80076-HEPATIC) TLB-BMP (Basic Metabolic Panel-BMET) (80048-METABOL) TLB-Sedimentation Rate (ESR) (85652-ESR) T-Antinuclear Antib (ANA) (32440-10272) TLB-CK Total Only(Creatine Kinase/CPK) (82550-CK) T- * Misc. Laboratory test [phone]) T- * Misc. Laboratory test [phone]) T-2 View CXR (71020TC)  Patient Instructions: 1)  Please schedule a follow-up appointment in 3 weeks. 2)  Lab 3)  A chest x-ray has been recommended.  Your imaging study may require preauthorization.  4)  Script for tramadol 5)  ......................................................................... 6)  CHEST - 2 VIEW (One week off prednisone- CDY) 7)    8)  Comparison: 06/01/2010 and CT chest 05/29/2010 9)    10)  Findings: Trachea is midline.  Heart size normal.  There has been 11)  near-complete resolution of bilateral air space disease.  Some 12)  residual opacity is seen in the right mid lung zone.  No pleural 13)  fluid. 14)    15)  IMPRESSION: 16)  Near complete resolution of bilateral  air space disease, with some 17)  residual opacity in the right midlung zone. 18)    19)  Read By:  Reyes Ivan.,  M.D.     20)  Released By:  Reyes Ivan.,  M.D. Prescriptions: TRAMADOL HCL 50 MG TABS (TRAMADOL HCL) 1-2 four times a day as needed pain  #75 x 2   Entered and Authorized by:   Waymon Budge MD   Signed by:   Waymon Budge MD on 06/27/2010   Method used:   Print then Give to Patient   RxID:   [id number]

## 2010-11-20 NOTE — Assessment & Plan Note (Signed)
Summary: rov after pft ///kp   Copy to:  Nani Gasser Primary Provider/Referring Provider:  Nani Gasser MD  CC:  follow up visit-review PFT and CT. "things are still the same since last OV"; using inhaler 2-3 times a day and having squeaking/vibration sounds in lungs at the end of the day; can tell a difference using Symbicort.[name]  History of Present Illness: History of Present Illness: April 09, 2010- 29 yo M referred cortesy of Dr Linford Arnold complaining of "lung spasms". Never smoker, working as a Engineer, manufacturing systems at [employer]. Told "maybe asthma" in college and given an inhaler he never used. Eczema at age 48.  New onset late November, 2010 of progressive nasal congestion, itching, sneezing, watery eyes and nose. He used Administrator, sports. He was doing a lot of flying for job training. Did not clearly improve while in [facility] for a month. While in [place], [place] he was SOB, heavy feeling in chest. Went to an urgent care where an albuterol inhaler helped. Was then told by ENT that on CT "sinuses were full" and "probably allergy". Treated with amoxacillin and levaquin. He was given a nasal neb and injection then.   He was given Omnaris( needs 1 puff bid) and Astepro by his primary office, along with a rescue inhaler. Allegra-D is " a big help". His nose and chest were well after a prednisone burst, which was repeated in mid-May.  Did well again until recent weeks,  when he is again needing to use his rescue inhaler for chest tightness about 2x/ day. Now reports thick nasal mucus, nasal congestion and postnasal drip, sneeze. Nasal mucus sometimes dark with ? blood. Denies headache. Neti pot irritates his nose.  Allergy profile 01/22/10 IgE 75.5 without specific elevations on broad or food panels. Lives [place] new carpeted apartment, [place], no mold. Has had cat x 3 years  May 09, 2010- Asthma, allergic rhinosinusitis CT sinus- images reviewed- looks clear- Need report. Has has 1 more week of  clindamicin for Dr Lazarus Salines.Still has bothersome nasal congestion despite Allegra-D. PFT.- mild restriction reflecting his tall, slender body build. Symbicort seems to help- lungs feel more open and stable with less cough and throat tightness. Uses Proair 1-2x daily for sustained cough. Tried Singulair- no help for rhinitis, but he isn't sure if it helped his dyspnea.[name] He feels episodes of cough. Dyspnea at times lying in bed.   Asthma History    Initial Asthma Severity Rating:    Age range: 12+ years    Symptoms: 0-2 days/week    Nighttime Awakenings: 0-2/month    Interferes w/ normal activity: no limitations    SABA use (not for EIB): daily    Asthma Severity Assessment: Moderate Persistent   Preventive Screening-Counseling & Management  Alcohol-Tobacco     Smoking Status: never  Current Medications (verified): 1)  Proair Hfa 108 (90 Base) Mcg/act Aers (Albuterol Sulfate) .... Use With Aerochamber 2 Puffs Q 4 Hrs Prn 2)  Omnaris 50 Mcg/act Susp (Ciclesonide) .... 2 Sprays in Each Nostril Daily 3)  Allegra-D 24 Hour 180-240 Mg Xr24h-Tab (Fexofenadine-Pseudoephedrine) .... Take 1 Tablet By Mouth Once A Day 4)  Astepro 0.15 % Soln (Azelastine Hcl) .... One Spray in Each Nostril Once A Day in The Evening As Needed 5)  Symbicort 160-4.5 Mcg/act Aero (Budesonide-Formoterol Fumarate) .... 2 Puffs and Rinse Mouth, Twice Daily  Allergies (verified): 1)  Sulfa  Past History:  Past Medical History: Last updated: 04/09/2010 Chronic prostatis chronic sinusitis Allergic Rhinitis Asthma  Past  Surgical History: Last updated: 09/25/2009 Tympanoplasty 2003, left ear.    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 Married to [place].  Never Smoked Alcohol use-yes Drug use-no Regular exercise-no No caffeine.    Risk Factors: Alcohol Use: <1 (09/25/2009) Exercise: no (09/25/2009)  Risk Factors: Smoking Status: never (05/09/2010)  Review of Systems      See HPI       The patient complains of shortness of breath with activity, shortness of breath at rest, non-productive cough, and nasal congestion/difficulty breathing through nose.  The patient denies productive cough, coughing up blood, chest pain, irregular heartbeats, acid heartburn, indigestion, loss of appetite, weight change, abdominal pain, difficulty swallowing, sore throat, tooth/dental problems, headaches, and sneezing.    Vital Signs:  Patient profile:   30 year old male Height:      79 inches Weight:      157 pounds BMI:     17.75 O2 Sat:      96 % on Room air Pulse rate:   92 / minute BP sitting:   120 / 80  (right arm) Cuff size:   regular  Vitals Entered By: [name] CMA (May 09, 2010 10:04 AM)  O2 Flow:  Room air CC: follow up visit-review PFT and CT. "things are still the same since last OV"; using inhaler 2-3 times a day and having squeaking/vibration sounds in lungs at the end of the day; can tell a difference using Symbicort.   Physical Exam  Additional Exam:  General: A/Ox3; pleasant and cooperative, NAD, tall thin habitus SKIN: no rash, lesions NODES: no lymphadenopathy HEENT: [place]/AT, EOM- WNL, Conjuctivae- clear, PERRLA, dark circles, TM- right TM scarred and retracted, Nose- mucus right nare, no polyps, sounds obviously stuffy. Throat- clear and wnl NECK: Supple w/ fair ROM, JVD- none, normal carotid impulses w/o bruits Thyroid- normal to palpation CHEST: no wheeze or acive cough today HEART: RRR, no m/g/r heard ABDOMEN: Soft and nl; ZOX:WRUE, nl pulses, no edema  NEURO: Grossly intact to observation      Impression & Recommendations:  Problem # 1:  ASTHMA (ICD-493.90) Although not apparent on PFT today, I think his symptomatic response to bronchodilators supports a dx of mild asthma. We will  continue the Symbvicort. I asked him to try Singulair again, this time specifically for his lungs.  Problem # 2:  ALLERGIC RHINITIS (ICD-477.9)  Chronic rhinitis. He will f/u with Dr Lazarus Salines, but nmeanwhile we will  bring him back for allergy skin test. His updated medication list for this problem includes:    Omnaris 50 Mcg/act Susp (Ciclesonide) .[name]... 2 sprays in each nostril daily    Astepro 0.15 % Soln (Azelastine hcl) ..... One spray in each nostril once a day in the evening as needed  Medications Added to Medication List This Visit: 1)  Singulair 10 Mg Tabs (Montelukast sodium) .[name].. 1 daily  Other Orders: Est. Patient Level IV (45409)  Patient Instructions: 1)  Schedule return as able for allergy skin tests.   Stop all antihistamines 3 days before skin testing, including cold and allergy meds, otc sleep and cough meds. This includes Astepro and Allegra-D. 2)  Try sample/ script Singulair 10 mg, 1 daily Prescriptions: SINGULAIR 10 MG TABS (MONTELUKAST SODIUM) 1 daily  #30 x prn   Entered and Authorized by:   Waymon Budge MD  Signed by:   Waymon Budge MD on 05/09/2010   Method used:   Print then Give to Patient   RxID:   [id number]

## 2010-11-20 NOTE — Progress Notes (Signed)
Summary: prescript  Phone Note Call from Patient   Caller: Patient Call For: young Summary of Call: pt had samples of symbicort would like prescript called to pharmacy  [facility] [name] Initial call taken by: [name],  April 26, 2010 8:28 AM  Follow-up for Phone Call        [facility] to give Symbicort 160/4.5 # 1 2 puffs two times a day and RINSE MOUTH AFTER USE prn  refills.[name] CMA  April 26, 2010 9:12 AM   RX sent. Left message that RX was sent on VM at work; if any questions or concerns call our office.[name] CMA  April 26, 2010 9:14 AM     Prescriptions: SYMBICORT 160-4.5 MCG/ACT AERO (BUDESONIDE-FORMOTEROL FUMARATE) 2 puffs and rinse mouth, twice daily  #1 x 11   Entered by:   [name] CMA   Authorized by:   Waymon Budge MD   Signed by:   [name] CMA on 04/26/2010   Method used:   Electronically to        [facility]* (retail)       [address] [place], [place]  [address]       Ph: [phone]       Fax: [phone]   RxID:   [id number]

## 2010-11-20 NOTE — Consult Note (Signed)
Summary: [facility]   Imported By: Sherian Rein 07/24/2010 15:20:21  _____________________________________________________________________  External Attachment:    Type:   Image     Comment:   External Document

## 2010-11-20 NOTE — Progress Notes (Signed)
Summary: Rx for nasal decongestant  Phone Note Call from Patient Call back at Home Phone [phone]   Summary of Call: Pt calls and says the samples of med you gave him nasal ddecongestant is working and would like a rx for them sent to his pharmacy Initial call taken by: Kathlene November,  February 15, 2010 2:21 PM    Prescriptions: OMNARIS 50 MCG/ACT SUSP (CICLESONIDE) 2 sprays in each nostril daily  #1 x 4   Entered and Authorized by:   Nani Gasser MD   Signed by:   Nani Gasser MD on 02/15/2010   Method used:   Electronically to        [facility]* (retail)       [address] [place], [place]  [address]       Ph: [phone]       Fax: [phone]   RxID:   [id number] ASTEPRO 0.15 % SOLN (AZELASTINE HCL) one spray in each nostril once a day in the evening.  #1 x 4   Entered and Authorized by:   Nani Gasser MD   Signed by:   Nani Gasser MD on 02/15/2010   Method used:   Electronically to        [facility]* (retail)       [address] [place], [place]  [address]       Ph: [phone]       Fax: [phone]   RxID:   [id number]

## 2010-11-20 NOTE — Assessment & Plan Note (Signed)
Summary: asthma    Vital Signs:  Patient profile:   30 year old male Height:      78.1 inches Weight:      160 pounds BMI:     18.51 O2 Sat:      98 % on Room air Temp:     98.5 degrees F oral Pulse rate:   64 / minute BP sitting:   125 / 78  (left arm)  Vitals Entered By: Payton Spark CMA (Mar 01, 2010 11:21 AM)  O2 Flow:  Room air  Serial Vital Signs/Assessments:  Comments: 11:26 AM Peak Flow [address] By: Payton Spark CMA   CC: Bad cold and cough. Feels like he struggles to breath at times.   Primary Care Provider:  Nani Gasser MD  CC:  Bad cold and cough. Feels like he struggles to breath at times..  History of Present Illness: 30  yo WM presents for some recurring 'sinus issues' on and off since Dec.  He tried nasal steroids and they have not helped.  He has had a cough also and was prescribed an inhaler that he is using 2 x a day.  He has a try cough.  He has chest tightness and SOB.  He denies a hx of asthma or allergies.  He has an appt with ENT next wk.  Reports having negative allergy testing.  Gets short term relief from Ventolin.  He has not had a fever or chills.  He has nasal congestion with bloody mucous.  He has been on and off antibiotics over the past 4 mos.    Current Medications (verified): 1)  Ventolin Hfa Inhaler 8 Gram .... 2-4 Puff Inhaled Every 4-6 Hours As Needed. 2)  Omnaris 50 Mcg/act Susp (Ciclesonide) .... 2 Sprays in Each Nostril Daily 3)  Allegra 180 Mg Tabs (Fexofenadine Hcl) .... Take One Tablet By Mouth Once A Day 4)  Astepro 0.15 % Soln (Azelastine Hcl) .... One Spray in Each Nostril Once A Day in The Evening.  Allergies (verified): 1)  Sulfa  Past History:  Past Medical History: Chronic prostatis chronic sinusitis  Past Surgical History: Reviewed history from 09/25/2009 and no changes required. Tympanoplasty 2003, left ear.    Social History: Reviewed history from 09/25/2009 and no changes  required. 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 Married to [place].  Never Smoked Alcohol use-yes Drug use-no Regular exercise-no No caffeine.   Review of Systems      See HPI  Physical Exam  General:  alert, well-developed, well-nourished, and well-hydrated.  thin, lanky male Head:  normocephalic and atraumatic.   Eyes:  conjunctiva clear Nose:  nasal congestion present Mouth:  good dentition and pharynx pink and moist.   Neck:  no masses.   Chest Wall:  no tenderness.   Lungs:  bibasilar exp wheezing with prolonged exp phase.  nonlabored. no rhonchi or crackles.   Heart:  Normal rate and regular rhythm. S1 and S2 normal without gallop, murmur, click, rub or other extra sounds. Pulses:  <2 sec cap RF 2+ radial pulses Extremities:  no E/C/C Skin:  color normal.   Cervical Nodes:  No lymphadenopathy noted   Impression & Recommendations:  Problem # 1:  ACUTE BRONCHOSPASM (ICD-519.11) Pt appears to be having a flare of asthma but has no documented hx of asthma or allergies.  Will treat him with Solumedrol 125 mg IM today and then start oral steroid tomorrow x 5 days.  he  is to stay on oral anti histamines + Omnaris and keep f/u with ENT next wk to discuss the chronic sinusitis issue.  I will set him up for PFTs with Dr Delford Field.  He is not a smoker and has not acute environmental changes as to reason why this has started.  I did change his rescue inhaler to a sample of ProAir with an aeochamber to use 4 x a day.   Orders: Pulmonary Referral (Pulmonary) Solumedrol up to 125mg  (V7846) Admin of Therapeutic Inj  intramuscular or subcutaneous (96372) Peak Flow Rate (94150)  Complete Medication List: 1)  Proair Hfa 108 (90 Base) Mcg/act Aers (Albuterol sulfate) .... Use with aerochamber 2 puffs q 4 hrs prn 2)  Omnaris 50 Mcg/act Susp (Ciclesonide) .... 2 sprays in each nostril daily 3)  Allegra 180 Mg Tabs (Fexofenadine hcl) .... Take one tablet by mouth once a day 4)   Astepro 0.15 % Soln (Azelastine hcl) .... One spray in each nostril once a day in the evening. 5)  Prednisone 20 Mg Tabs (Prednisone) .... 3 tabs by mouth once a day x 5 days  Patient Instructions: 1)  Steroid injection today. 2)  Start Prednisone tomorrow morning -- take 3 tabs once a day x 5 days. 3)  F/U with ENT next wk. 4)  Will set you up for PFTs with Dr Delford Field in the next 2-3 wks. 5)  Change rescue inhaler to ProAir.  Use with spacer 2 puffs 4 x a day, more if needed. 6)  Call if getting any worse, esp after finishing prednisone. Prescriptions: PREDNISONE 20 MG TABS (PREDNISONE) 3 tabs by mouth once a day x 5 days  #15 x 0   Entered and Authorized by:   Seymour Bars DO   Signed by:   Seymour Bars DO on 03/01/2010   Method used:   Electronically to        [facility]* (retail)       [address] [place], [place]  [address]       Ph: [phone]       Fax: [phone]   RxID:   [id number]    Medication Administration  Injection # 1:    Medication: Solumedrol up to 125mg     Diagnosis: ACUTE BRONCHOSPASM (ICD-519.11)    Route: IM    Site: RUOQ gluteus    Exp Date: 06/2012    Lot #: [id number]    Patient tolerated injection without complications    Given by: Payton Spark CMA (Mar 01, 2010 11:54 AM)  Orders Added: 1)  Pulmonary Referral [Pulmonary] 2)  Solumedrol up to 125mg  [J2930] 3)  Admin of Therapeutic Inj  intramuscular or subcutaneous [96372] 4)  Peak Flow Rate [94150] 5)  Est. Patient Level IV [33295]

## 2010-11-20 NOTE — Progress Notes (Signed)
Summary: need referral  Phone Note Call from Patient   Caller: Patient Call For: young Summary of Call: pain medication have not helped . he would like to be referred to dr Jimmy Footman ( rhuematologist) Initial call taken by: [name],  June 28, 2010 4:27 PM  Follow-up for Phone Call        Called and spoke with pt.  He states that the tramadol has only helped slightly with pain.  He states that he has discussed getting a rheum dr with [facility] before, and would like to go ahead and be referred to see Dr. Isac Caddy. Pls advise, thanks! Follow-up by: Vernie Murders,  June 28, 2010 4:36 PM  Additional Follow-up for Phone Call Additional follow up Details #1::        I called him. We had discussed Rheumatology referral and I will arrange it. Reviewed labs back so far. Muscles very painful, but CK not elevated, LFT's and sed rate only minimally up. Will script some percocet to try since he doesn't like the way tramadol is making him feel.  Additional Follow-up by: Waymon Budge MD,  June 28, 2010 5:52 PM  New Problems: MYALGIA (ICD-729.1)   New Problems: MYALGIA (ICD-729.1) New/Updated Medications: OXYCODONE-ACETAMINOPHEN 10-650 MG TABS (OXYCODONE-ACETAMINOPHEN) 1 every 4 hours if needed for pain Prescriptions: OXYCODONE-ACETAMINOPHEN 10-650 MG TABS (OXYCODONE-ACETAMINOPHEN) 1 every 4 hours if needed for pain  #20 x 0   Entered and Authorized by:   Waymon Budge MD   Signed by:   Waymon Budge MD on 06/28/2010   Method used:   Print then Give to Patient   RxID:   [id number]   Appended Document: Pain med Reconsidered pain Rx- He felt tramadol too strong. I will suggest Advil and give back-up Rx changing original script for Percocet to lowest strength and will not give him the 10-650 script.   Clinical Lists Changes  Medications: Changed medication from OXYCODONE-ACETAMINOPHEN 10-650 MG TABS (OXYCODONE-ACETAMINOPHEN) 1 every 4 hours if needed for pain to  OXYCODONE-ACETAMINOPHEN 5-325 MG TABS (OXYCODONE-ACETAMINOPHEN) 1 or 2 every 6 hours if needed for severe pain - Signed Rx of OXYCODONE-ACETAMINOPHEN 5-325 MG TABS (OXYCODONE-ACETAMINOPHEN) 1 or 2 every 6 hours if needed for severe pain;  #15 x 0;  Signed;  Entered by: Waymon Budge MD;  Authorized by: Waymon Budge MD;  Method used: Print then Give to Patient    Prescriptions: OXYCODONE-ACETAMINOPHEN 5-325 MG TABS (OXYCODONE-ACETAMINOPHEN) 1 or 2 every 6 hours if needed for severe pain  #15 x 0   Entered and Authorized by:   Waymon Budge MD   Signed by:   Waymon Budge MD on 06/29/2010   Method used:   Print then Give to Patient   RxID:   [id number]

## 2010-11-20 NOTE — Progress Notes (Signed)
Summary: ENT referral  Phone Note Call from Patient Call back at Home Phone [phone]   Caller: Patient Call For: Nani Gasser MD Summary of Call: Pt calls and states that the Astepro and Omnaris help some with sinuses but would like to go ahead and be referred to an ENT if possible Initial call taken by: Kathlene November,  February 12, 2010 10:07 AM  Follow-up for Phone Call        Will refer.  Follow-up by: Nani Gasser MD,  February 12, 2010 10:22 AM

## 2010-11-20 NOTE — Assessment & Plan Note (Signed)
Summary: BRONCHOSPASM/APC   Copy to:  Nani Gasser Primary Provider/Referring Provider:  Nani Gasser MD  CC:  Pulmonary Consult.  [name]  History of Present Illness: April 09, 2010- 29 yo M referred cortesy of Dr Linford Arnold complaining of "lung spasms". Never smoker, working as a Engineer, manufacturing systems at [employer]. Told "maybe asthma" in college and given an inhaler he never used. Eczema at age 30.  New onset late November, 2010 of progressive nasal congestion, itching, sneezing, watery eyes and nose. He used Administrator, sports. He was doing a lot of flying for job training. Did not clearly improve while in [facility] for a month. While in [place], [place] he was SOB, heavy feeling in chest. Went to an urgent care where an albuterol inhaler helped. Was then told by ENT that on CT "sinuses were full" and "probably allergy". Treated with amoxacillin and levaquin. He was given a nasal neb and injection then.   He was given Omnaris( needs 1 puff bid) and Astepro by his primary office, along with a rescue inhaler. Allegra-D is " a big help". His nose and chest were well after a prednisone burst, which was repeated in mid-May.  Did well again until recent weeks,  when he is again needing to use his rescue inhaler for chest tightness about 2x/ day. Now reports thick nasal mucus, nasal congestion and postnasal drip, sneeze. Nasal mucus sometimes dark with ? blood. Denies headache. Neti pot irritates his nose.  Allergy profile 01/22/10 IgE 75.5 without specific elevations on broad or food panels. Lives in new carpeted apartment, [place], no mold. Has had cat x 3 years   Asthma History    Asthma Control Assessment:    Age range: 31+ years    Symptoms: 0-2 days/week    Nighttime Awakenings: 0-2/month    Interferes w/ normal activity: no limitations    SABA use (not for EIB): >2 days/week    Asthma Control Assessment: Not Well Controlled   Preventive Screening-Counseling & Management  Alcohol-Tobacco  Smoking Status: never  Current Medications (verified): 1)  Proair Hfa 108 (90 Base) Mcg/act Aers (Albuterol Sulfate) .... Use With Aerochamber 2 Puffs Q 4 Hrs Prn 2)  Omnaris 50 Mcg/act Susp (Ciclesonide) .... 2 Sprays in Each Nostril Daily 3)  Allegra-D 24 Hour 180-240 Mg Xr24h-Tab (Fexofenadine-Pseudoephedrine) .... Take 1 Tablet By Mouth Once A Day 4)  Astepro 0.15 % Soln (Azelastine Hcl) .... One Spray in Each Nostril Once A Day in The Evening As Needed  Allergies (verified): 1)  Sulfa  Past History:  Past Surgical History: Last updated: 09/25/2009 Tympanoplasty 2003, left ear.    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 Married to [place].  Never Smoked Alcohol use-yes Drug use-no Regular exercise-no No caffeine.   Risk Factors: Alcohol Use: <1 (09/25/2009) Exercise: no (09/25/2009)  Risk Factors: Smoking Status: never (04/09/2010)  Past Medical History: Chronic prostatis chronic sinusitis Allergic Rhinitis Asthma  Family History: GF with MI Father with hi chol, Hashimoto's thyroiditis Heart disease-PGF  Review of Systems      See HPI       The patient complains of shortness of breath at rest, productive cough, non-productive cough, acid heartburn, weight change, nasal congestion/difficulty breathing through nose, sneezing, and change in color of mucus.  The patient denies shortness of breath with activity, coughing up blood, chest pain, irregular  heartbeats, indigestion, loss of appetite, abdominal pain, difficulty swallowing, sore throat, tooth/dental problems, headaches, itching, ear ache, anxiety, depression, hand/feet swelling, joint stiffness or pain, rash, and fever.    Vital Signs:  Patient profile:   30 year old male Height:      79 inches Weight:      160 pounds O2 Sat:       98 % on Room air Pulse rate:   71 / minute BP sitting:   102 / 70  (left arm) Cuff size:   regular  Vitals Entered By: Gweneth Dimitri RN (April 09, 2010 1:43 PM)  O2 Flow:  Room air CC: Pulmonary Consult.   Comments Medications reviewed with patient Daytime contact number verified with patient. Gweneth Dimitri RN  April 09, 2010 1:43 PM    Physical Exam  Additional Exam:  General: A/Ox3; pleasant and cooperative, NAD, tall thin habitus SKIN: no rash, lesions NODES: no lymphadenopathy HEENT: [place]/AT, EOM- WNL, Conjuctivae- clear, PERRLA, dark circles, TM- right TM scarred and retracted, Nose- mucus right nare, no polyps , Throat- clear and wnl NECK: Supple w/ fair ROM, JVD- none, normal carotid impulses w/o bruits Thyroid- normal to palpation CHEST: wheeze I&E onb right> left HEART: RRR, no m/g/r heard ABDOMEN: Soft and nl; nml bowel sounds; no organomegaly or masses noted ZOX:WRUE, nl pulses, no edema  NEURO: Grossly intact to observation      Impression & Recommendations:  Problem # 1:  RHINOSINUSITIS, ALLERGIC, 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 Hx suggests relation to barotrauma from air plane trips. Contributory role of allergy is not obvious from blood tests, which are not as sensitive as skin testing. We will see if we can get the CT sinus report done in May in [place], but may need to repeat, and [facility].He is instructed to keep cat out of bedroom, though that may not make much difference in an apartment. Continue Omnaris and as needed Astepro. He may need a more dilute solution for comfortable use of Neti pot. His updated medication list for this problem includes:    Omnaris 50 Mcg/act Susp (Ciclesonide) .[name]... 2 sprays in each nostril daily    Astepro 0.15 % Soln (Azelastine hcl) ..... One spray in each nostril once a day in the evening as needed  Problem # 2:  ASTHMA (ICD-493.90) There is a mild asthma pattern. 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 Other employees there have seemed to relate respiratory symptoms to that building in an inconsistent pattern. We will get PFT and CXR, also CBC for eosinophil and total WBC counts. Try regular use of Symbicort.  Problem # 3:  EUSTACHIAN TUBE DYSFUNCTION (ICD-381.81) Need for tympanoplasty as a child and discomfort after plane flights suggest some impaired pressure stabilization that may respond to the nasal steroid.  Medications Added to Medication List This Visit: 1)  Allegra-d 24 Hour 180-240 Mg Xr24h-tab (Fexofenadine-pseudoephedrine) .... Take 1 tablet by mouth once a day 2)  Astepro 0.15 % Soln (Azelastine hcl) .... One spray in each nostril once a day in the evening as needed 3)  Symbicort 160-4.5 Mcg/act Aero (Budesonide-formoterol fumarate) .... 2 puffs and rinse mouth, twice daily  Other Orders: Consultation Level IV (99244) TLB-CBC Platelet - w/Differential (85025-CBCD) T-2 View CXR (71020TC)  Patient Instructions: 1)  Please schedule a follow-up appointment in 1 month. 2)  If you can get [place] a copy of the radiology report of your CT sinuses, that would help.  3)  sample/ script Symbicort 160/4.5: 4)    2 puffs and rinse mouth, twice every day 5)  Suggest you at least use up your remaining Omnaris 6)      1-2 puffs each nostril once daily at bedtime. 7)  A chest x-ray has been recommended.  Your imaging study may require preauthorization.  8)  Schedule PFT 9)  Lab Prescriptions: SYMBICORT 160-4.5 MCG/ACT AERO (BUDESONIDE-FORMOTEROL FUMARATE) 2 puffs and rinse mouth, twice daily  #1 x prn   Entered and Authorized by:   Waymon Budge MD   Signed by:   Waymon Budge MD on 04/09/2010   Method used:   Historical   RxID:   [id number]

## 2010-11-20 NOTE — Consult Note (Signed)
Summary: [facility]  [facility]   Imported By: Lanelle Bal 07/03/2010 11:32:35  _____________________________________________________________________  External Attachment:    Type:   Image     Comment:   External Document

## 2010-11-20 NOTE — Assessment & Plan Note (Signed)
Summary: sob and cough//lmr   Copy to:  Nani Gasser Primary Teka Chanda/Referring Alaina Donati:  Nani Gasser MD  CC:  Accute visit-low grade fever in pm; tightness in airways; cough-mostly non productive; squeaking sounds in chest(no relief with ProAir HFA inhaler) x 1week and getting worse. ? Infection.  History of Present Illness: May 09, 2010- Asthma, allergic rhinosinusitis CT sinus- images reviewed- looks clear- Need report. Has has 1 more week of clindamicin for Dr Lazarus Salines.Still has bothersome nasal congestion despite Allegra-D. PFT.- mild restriction reflecting his tall, slender body build. Symbicort seems to help- lungs feel more open and stable with less cough and throat tightness. Uses Proair 1-2x daily for sustained cough. Tried Singulair- no help for rhinitis, but he isn't sure if it helped his dyspnea.[name] He feels episodes of cough. Dyspnea at times lying in bed.  May 25, 2010- Asthma/ chronic cough, allergic rhinitis Inhalers help only transiently. Mild fever- 100 at night, only a few sweats, no chills and no purulence, nodes or rash. Dry cough or scant clear mucus. Chest and throat tired/ sore from cough. No environmental factors.  More aware of reflux because he has been paying attention to it.    Asthma History    Asthma Control Assessment:    Age range: 12+ years    Symptoms: >2 days/week    Nighttime Awakenings: 0-2/month    Interferes w/ normal activity: no limitations    SABA use (not for EIB): 0-2 days/week    Asthma Control Assessment: Not Well Controlled   Preventive Screening-Counseling & Management  Alcohol-Tobacco     Alcohol drinks/day: <1     Smoking Status: never  Current Medications (verified): 1)  Proair Hfa 108 (90 Base) Mcg/act Aers (Albuterol Sulfate) .... Use With Aerochamber 2 Puffs Q 4 Hrs Prn 2)  Omnaris 50 Mcg/act Susp (Ciclesonide) .... 2 Sprays in Each Nostril Daily 3)  Allegra-D 24 Hour 180-240 Mg Xr24h-Tab  (Fexofenadine-Pseudoephedrine) .... Take 1 Tablet By Mouth Once A Day 4)  Astepro 0.15 % Soln (Azelastine Hcl) .... One Spray in Each Nostril Once A Day in The Evening As Needed 5)  Symbicort 160-4.5 Mcg/act Aero (Budesonide-Formoterol Fumarate) .... 2 Puffs and Rinse Mouth, Twice Daily 6)  Singulair 10 Mg Tabs (Montelukast Sodium) .[name].. 1 Daily  Allergies (verified): 1)  Sulfa  Past History:  Past Medical History: Last updated: 04/09/2010 Chronic prostatis chronic sinusitis Allergic Rhinitis Asthma  Past Surgical History: Last updated: 09/25/2009 Tympanoplasty 2003, left ear.    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  Never Smoked Alcohol use-yes Drug use-no Regular exercise-no No caffeine.   Risk Factors: Alcohol Use: <1 (05/25/2010) Exercise: no (09/25/2009)  Risk Factors: Smoking Status: never (05/25/2010)  Review of Systems      See HPI       The patient complains of shortness of breath with activity, non-productive cough, acid heartburn, and sore throat.  The patient denies productive cough, coughing up blood, chest pain, irregular heartbeats, indigestion, loss of appetite, weight change, abdominal pain, difficulty swallowing, headaches, nasal congestion/difficulty breathing through nose, sneezing, shortness of breath at rest, tooth/dental problems, and itching.    Vital Signs:  Patient profile:   30 year old male Height:      79 inches Weight:      157.25 pounds BMI:     17.78 O2  Sat:      98 % on Room air Temp:     98.2 degrees F oral Pulse rate:   87 / minute BP sitting:   120 / 80  (left arm) Cuff size:   regular  Vitals Entered By: [name] CMA (May 25, 2010 3:55 PM)  O2 Flow:  Room air CC: Accute visit-low grade fever in pm; tightness in airways;  cough-mostly non productive; squeaking sounds in chest(no relief with ProAir HFA inhaler) x 1week and getting worse. ? Infection   Physical Exam  Additional Exam:  General: A/Ox3; pleasant and cooperative, NAD, tall thin habitus SKIN: no rash, lesions NODES: no lymphadenopathy HEENT: [place]/AT, EOM- WNL, Conjuctivae- clear, PERRLA, dark circles, TM- right TM scarred and retracted, Nose- mucus right nare, no polyps, sounds obviously stuffy. Throat- clear and wnl NECK: Supple w/ fair ROM, JVD- none, normal carotid impulses w/o bruits Thyroid- normal to palpation CHEST: Persistent dry cough, bilateral end-inspiratory wheeze, no rhonci  HEART: RRR, no m/g/r heard ABDOMEN: Soft and nl; No bruit EAV:WUJW, nl pulses, no edema  NEURO: Grossly intact to observation      Impression & Recommendations:  Problem # 1:  ASTHMA (ICD-493.90) He says he feels well except for his airway, and exam favors asthma. I don't understand reported fever, unless he has a supeirimposed summer viral cold. I don't a murmur or bruit. Note past hx of sinusitis and of prostatitis. Not clear why asthma is worse unless it is an air quality problem otherwise. we will try prednisone if needed, and I want to check labs and CXR.  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 His updated medication list for this problem includes:    Omnaris 50 Mcg/act Susp (Ciclesonide) .[name]... 2 sprays in each nostril daily    Astepro 0.15 % Soln (Azelastine hcl) ..... One spray in each nostril once a day in the evening as needed  Medications Added to Medication List This Visit: 1)  Prednisone 10 Mg Tabs (Prednisone) .[name].. 1 tab four times daily x 2 days, 3 times daily x 2 days, 2 times daily x 2 days, 1 time daily x 2 days  Other Orders: Est. Patient Level III (11914) TLB-BMP (Basic Metabolic Panel-BMET) (80048-METABOL) TLB-CBC Platelet - w/Differential (85025-CBCD) T-D-Dimer  Fibrin Derivatives Quantitive (78295-62130) TLB-Sedimentation Rate (ESR) (85652-ESR) Radiology Referral (Radiology)  Patient Instructions: 1)  Please schedule a follow-up appointment in 1 month. 2)  Lab 3)  A Chest CT with Contrast has been recommended.  Your imaging study may require preauthorization.  4)  Sample Dulera 200-5, 2 puffs and rinse , twice daily 5)  Script for prednisone to use if [facility] doesn't clear you up in 4-5 days. 6)  script for Proair Prescriptions: PROAIR HFA 108 (90 BASE) MCG/ACT AERS (ALBUTEROL SULFATE) use with aerochamber 2 puffs q 4 hrs prn  #1 x prn   Entered and Authorized by:   Waymon Budge MD   Signed by:   Waymon Budge MD on 05/25/2010   Method used:   Print then Give to Patient   RxID:   [id number] PREDNISONE 10 MG TABS (PREDNISONE) 1 tab four times daily x 2 days, 3 times daily x 2 days, 2 times daily x 2 days, 1 time daily x 2 days  #20 x 0   Entered and Authorized by:   Waymon Budge MD   Signed by:   Waymon Budge MD on 05/25/2010  Method used:   Print then Give to Patient   RxID:   [id number]

## 2010-11-20 NOTE — Progress Notes (Signed)
Summary: referral  Phone Note Call from Patient   Caller: Patient Call For: young Summary of Call: calling to see if referral for him to see rhuematolagist have been done. Initial call taken by: [name],  July 02, 2010 4:17 PM  Follow-up for Phone Call        Called and spoke with pt.  He is wondering if referal was made to rheum.  I advised that Dr Maple Hudson sent order to [facility] and that [facility] sent order to for this on 06/29/10 and that the process is that there docs review pt records and call the pt for appt.  Pt verbalized understanding.  I gave him the number to call Dr Oleta Mouse in case he wants to. Follow-up by: Vernie Murders,  July 02, 2010 4:29 PM

## 2010-11-20 NOTE — Consult Note (Signed)
Summary: [facility]  [facility]   Imported By: Sherian Rein 04/19/2010 07:53:09  _____________________________________________________________________  External Attachment:    Type:   Image     Comment:   External Document

## 2010-11-22 NOTE — Letter (Signed)
Summary: [facility]   Imported By: Lanelle Bal 10/29/2010 12:32:36  _____________________________________________________________________  External Attachment:    Type:   Image     Comment:   External Document

## 2011-01-04 LAB — FUNGUS CULTURE W SMEAR

## 2011-01-04 LAB — CULTURE, RESPIRATORY W GRAM STAIN: Special Requests: ABNORMAL

## 2011-01-04 LAB — LEGIONELLA PROFILE(CULTURE+DFA/SMEAR): Legionella Antigen (DFA): NEGATIVE

## 2011-01-04 LAB — AFB CULTURE WITH SMEAR (NOT AT ARMC): Special Requests: ABNORMAL

## 2011-02-06 ENCOUNTER — Encounter (HOSPITAL_COMMUNITY): Payer: Self-pay

## 2011-02-08 ENCOUNTER — Encounter (HOSPITAL_COMMUNITY): Payer: 59 | Attending: Rheumatology

## 2011-02-08 DIAGNOSIS — M300 Polyarteritis nodosa: Secondary | ICD-10-CM | POA: Insufficient documentation

## 2011-02-15 ENCOUNTER — Encounter (HOSPITAL_COMMUNITY)
Admission: RE | Admit: 2011-02-15 | Discharge: 2011-02-15 | Disposition: A | Discharge status: Home / Self Care | Payer: 59 | Source: Ambulatory Visit | Attending: Rheumatology | Admitting: Rheumatology

## 2011-02-22 ENCOUNTER — Encounter (HOSPITAL_COMMUNITY): Payer: 59 | Attending: Rheumatology

## 2011-02-22 DIAGNOSIS — M300 Polyarteritis nodosa: Secondary | ICD-10-CM | POA: Insufficient documentation

## 2011-03-01 ENCOUNTER — Encounter (HOSPITAL_COMMUNITY): Payer: 59

## 2011-07-28 ENCOUNTER — Encounter: Payer: Self-pay | Admitting: Family Medicine

## 2011-07-31 ENCOUNTER — Ambulatory Visit (INDEPENDENT_AMBULATORY_CARE_PROVIDER_SITE_OTHER): Payer: 59 | Admitting: Family Medicine

## 2011-07-31 ENCOUNTER — Encounter: Payer: Self-pay | Admitting: Family Medicine

## 2011-07-31 VITALS — BP 124/75 | HR 60 | Wt 163.0 lb

## 2011-07-31 DIAGNOSIS — R768 Other specified abnormal immunological findings in serum: Secondary | ICD-10-CM

## 2011-07-31 DIAGNOSIS — L409 Psoriasis, unspecified: Secondary | ICD-10-CM

## 2011-07-31 DIAGNOSIS — L408 Other psoriasis: Secondary | ICD-10-CM

## 2011-07-31 DIAGNOSIS — J309 Allergic rhinitis, unspecified: Secondary | ICD-10-CM

## 2011-07-31 DIAGNOSIS — R894 Abnormal immunological findings in specimens from other organs, systems and tissues: Secondary | ICD-10-CM

## 2011-07-31 MED ORDER — FEXOFENADINE-PSEUDOEPHED ER 180-240 MG PO TB24: 1.0000 | ORAL_TABLET | Freq: Every day | ORAL | Status: DC

## 2011-07-31 NOTE — Progress Notes (Signed)
  Subjective:    Patient ID: Jesse Wong, male    DOB: 11-19-1980, 30 y.o.   MRN: [id number]  HPI After recurrent sinus infection.  Went to ENT who thought that it was just allergies. He then went to see her one week he is also an ENT in [place] he felt that he may have Wegener's granulomatosis.  Then sent to Pulmonology. Saw pulm nodules and then referred to rheum.  Hx of psoriasis but very mild. Now they think by have psoriatic arthritis.  Also has eczema on hand and feet.  He feels better on Allegra-D.  Also sys he feels better on steroids.  Has morning stiffness in his joint that last about an hour. Has very high + ANCA antibody. I also treated him with infliximab. He has been able to taper off of his steroids. Has been off steroids for 3 months.  He now would like to see her dermatologist. He is also considering allergy testing which was recommended initially. He in part is interested in allergy testing because he does still 40-50% better when he does take his Allegra-D daily. When he does not take it he has significant congestion cough chest pressure and tightness and extreme fatigue.  Review of Systems     Objective:   Physical Exam  Constitutional: He appears well-developed and well-nourished.       Very thin tall male  HENT:  Head: Normocephalic.  Cardiovascular: Normal rate, regular rhythm and normal heart sounds.   Pulmonary/Chest: Effort normal and breath sounds normal.  Skin: Skin is warm and dry.  Psychiatric: He has a normal mood and affect. His behavior is normal.          Assessment & Plan:  I explained to him that I really think he certainly could have a rheumatologic disorder in addition to allergic rhinitis. Therefore think it's not a bad idea to pursue the allergy testing. Since he has been off steroids for 3 months now be a good time to do that. But he would have to come off his Allegra-D for short period time before testing. We will also try to send a  prescription to the pharmacy for Allegra-D to see if his insurance will cover it so he doesn't have to pay so much out of pocket. I also will schedule him with reports dermatology. Will refer to [facility].   25 minutes spent face-to-face and discussion and counseling.

## 2011-07-31 NOTE — Patient Instructions (Signed)
We will schedule your derm referral and allergy referral.

## 2011-08-14 ENCOUNTER — Encounter: Payer: Self-pay | Admitting: Family Medicine

## 2011-12-21 ENCOUNTER — Emergency Department
Admission: EM | Admit: 2011-12-21 | Discharge: 2011-12-21 | Disposition: A | Discharge status: Home / Self Care | Payer: 59 | Source: Home / Self Care | Attending: Family Medicine | Admitting: Family Medicine

## 2011-12-21 DIAGNOSIS — J069 Acute upper respiratory infection, unspecified: Secondary | ICD-10-CM

## 2011-12-21 MED ORDER — BENZONATATE 200 MG PO CAPS: 200.0000 mg | ORAL_CAPSULE | Freq: Every day | ORAL | Status: AC

## 2011-12-21 MED ORDER — AZITHROMYCIN 250 MG PO TABS: ORAL_TABLET | ORAL | Status: AC

## 2011-12-21 NOTE — ED Notes (Signed)
Patient complains of moderate productive cough with yellow sputum x 3 days. He has also had fever, chills and night sweats. Patient had 2 injections of Humira 1 month ago for his joints.

## 2011-12-21 NOTE — Discharge Instructions (Signed)
Take plain Mucinex (guaifenesin) twice daily for cough and congestion.  May use Sudafed as needed.  Increase fluid intake, rest. May use Afrin nasal spray (or generic oxymetazoline) twice daily for about 5 days.  Also recommend using saline nasal spray several times daily and saline nasal irrigation (AYR is a common brand) Stop all antihistamines for now, and other non-prescription cough/cold preparations. Follow-up with family doctor if not improving 7 to 10 days.

## 2011-12-21 NOTE — ED Provider Notes (Signed)
History     CSN: [id number]  Arrival date & time 12/21/11  6962   First MD Initiated Contact with Patient 12/21/11 [phone]      Chief Complaint  Patient presents with  . Cough    x 3 days      HPI Comments: Patient complains of approximately 4 day history of gradually progressive URI symptoms beginning with a mild sore throat (now improved), followed by progressive nasal congestion.  A cough started about 3 days ago.  Complains of fatigue and initial myalgias.  Cough is now worse at night and generally non-productive during the day.  There has been no pleuritic pain, shortness of breath, or wheezes.  He has a past history of pneumonia and bronchitis, and chronic sinus congestion.  The history is provided by the patient.    Past Medical History  Diagnosis Date  . Tonsillar abscess   . Chronic prostatitis   . Sinusitis, chronic   . Allergy   . Asthma   . Pulmonary infiltrates   . Myalgia   . Vasculitis     Past Surgical History  Procedure Date  . Tympanoplasty   . Peritonsilar abscess   . Bronchoscopy 2011    TBBX ----inflammation    Family History  Problem Relation Age of Onset  . Heart attack      grandmother  . Hyperlipidemia Father   . Thyroid disease Father     hashimoto's thyroidistis  . Heart disease Paternal Grandfather     History  Substance Use Topics  . Smoking status: Never Smoker   . Smokeless tobacco: Not on file  . Alcohol Use: Yes      Review of Systems + sore throat + cough No pleuritic pain No wheezing + nasal congestion + post-nasal drainage No sinus pain/pressure No itchy/red eyes ? earache No hemoptysis No SOB + low grade fever, + chills No nausea No vomiting No abdominal pain No diarrhea No urinary symptoms No skin rashes + fatigue + myalgias + headache Used OTC meds without relief  Allergies  Sulfonamide derivatives  Home Medications   Current Outpatient Rx  Name Route Sig Dispense Refill  . ACETAMINOPHEN 325  MG PO TABS Oral Take 650 mg by mouth every 6 (six) hours as needed.    . ADALIMUMAB 40 MG/0.8ML [place] KIT Subcutaneous Inject 40 mg into the skin once. 2 injections last month    . OXYMETAZOLINE HCL 0.05 % NA SOLN Nasal Place 2 sprays into the nose 2 (two) times daily.    [name] PSEUDOEPHEDRINE HCL ER 120 MG PO TB12 Oral Take 120 mg by mouth daily as needed.    . AZITHROMYCIN 250 MG PO TABS  Take 2 tabs today; then begin one tab once daily for 4 more days. 6 each 0  . BENZONATATE 200 MG PO CAPS Oral Take 1 capsule (200 mg total) by mouth at bedtime. Take as needed for cough 12 capsule 0  . FEXOFENADINE-PSEUDOEPHED ER 180-240 MG PO TB24 Oral Take 1 tablet by mouth daily. 90 tablet 3  . NAPROXEN SODIUM 220 MG PO CAPS Oral Take by mouth.        BP 113/77  Pulse 82  Temp(Src) 99.1 F (37.3 C) (Oral)  Resp 17  Ht 6\' 7"  (2.007 m)  Wt 160 lb (72.576 kg)  BMI 18.02 kg/m2  SpO2 95%  Physical Exam Nursing notes and Vital Signs reviewed. Appearance:  Patient appears healthy, stated age, and in no acute distress Eyes:  Pupils are  equal, round, and reactive to light and accomodation.  Extraocular movement is intact.  Conjunctivae are not inflamed  Ears:  Canals normal.  Tympanic membranes normal.  Nose:  Mildly congested turbinates.  No sinus tenderness.   Pharynx:  Normal Neck:  Supple.  Slightly tender shotty anterior/posterior nodes are palpated bilaterally  Lungs:  Clear to auscultation.  Breath sounds are equal.  Heart:  Regular rate and rhythm without murmurs, rubs, or gallops.  Abdomen:  Nontender without masses or hepatosplenomegaly.  Bowel sounds are present.  No CVA or flank tenderness.  Extremities:  No edema.  No calf tenderness Skin:  No rash present.   ED Course  Procedures none      1. Acute upper respiratory infections of unspecified site       MDM  With history of pneumonia/bronchitis, will begin Z-pack, Tessalon at bedtime. Take plain Mucinex (guaifenesin) twice daily for  cough and congestion.  May use Sudafed as needed.  Increase fluid intake, rest. May use Afrin nasal spray (or generic oxymetazoline) twice daily for about 5 days.  Also recommend using saline nasal spray several times daily and saline nasal irrigation (AYR is a common brand) Stop all antihistamines for now, and other non-prescription cough/cold preparations. Follow-up with family doctor if not improving 7 to 10 days.         Donna Christen, MD 12/21/11 [phone]

## 2012-02-25 ENCOUNTER — Encounter: Payer: Self-pay | Admitting: Family Medicine

## 2012-02-25 ENCOUNTER — Ambulatory Visit (INDEPENDENT_AMBULATORY_CARE_PROVIDER_SITE_OTHER): Payer: 59 | Admitting: Family Medicine

## 2012-02-25 VITALS — BP 107/59 | HR 63 | Wt 164.0 lb

## 2012-02-25 DIAGNOSIS — R110 Nausea: Secondary | ICD-10-CM

## 2012-02-25 LAB — COMPLETE METABOLIC PANEL WITH GFR
CO2: 29 mEq/L (ref 19–32)
Calcium: 10.3 mg/dL (ref 8.4–10.5)
Creat: 0.93 mg/dL (ref 0.50–1.35)
GFR, Est African American: >89 mL/min
GFR, Est Non African American: >89 mL/min
Glucose, Bld: 77 mg/dL (ref 70–99)
Sodium: 140 mEq/L (ref 135–145)
Total Bilirubin: 0.7 mg/dL (ref 0.3–1.2)
Total Protein: 7.3 g/dL (ref 6.0–8.3)

## 2012-02-25 LAB — CBC
Hemoglobin: 16 g/dL (ref 13.0–17.0)
MCH: 31.6 pg (ref 26.0–34.0)
MCV: 91.3 fL (ref 78.0–100.0)
RBC: 5.07 MIL/uL (ref 4.22–5.81)

## 2012-02-25 MED ORDER — OMEPRAZOLE 40 MG PO CPDR: 40.0000 mg | DELAYED_RELEASE_CAPSULE | Freq: Every day | ORAL | Status: DC

## 2012-02-25 NOTE — Patient Instructions (Signed)
Diet for GERD or PUD Nutrition therapy can help ease the discomfort of gastroesophageal reflux disease (GERD) and peptic ulcer disease (PUD).  HOME CARE INSTRUCTIONS   Eat your meals slowly, in a relaxed setting.   Eat 5 to 6 small meals per day.   If a food causes distress, stop eating it for a period of time.  FOODS TO AVOID  Coffee, regular or decaffeinated.   Cola beverages, regular or low calorie.   Tea, regular or decaffeinated.   Pepper.   Cocoa.   High fat foods, including meats.   Butter, margarine, hydrogenated oil (trans fats).   Peppermint or spearmint (if you have GERD).   Fruits and vegetables if not tolerated.   Alcohol.   Nicotine (smoking or chewing). This is one of the most potent stimulants to acid production in the gastrointestinal tract.   Any food that seems to aggravate your condition.  If you have questions regarding your diet, ask your caregiver or a registered dietitian. TIPS  Lying flat may make symptoms worse. Keep the head of your bed raised 6 to 9 inches (15 to 23 cm) by using a foam wedge or blocks under the legs of the bed.   Do not lay down until 3 hours after eating a meal.   Daily physical activity may help reduce symptoms.  MAKE SURE YOU:   Understand these instructions.   Will watch your condition.   Will get help right away if you are not doing well or get worse.  Document Released: 10/07/2005 Document Revised: 09/26/2011 Document Reviewed: 08/23/2011 ExitCare Patient Information 2012 [employer]. 

## 2012-02-25 NOTE — Progress Notes (Signed)
  Subjective:    Patient ID: Jesse Wong, male    DOB: [birth year], 31 y.o.   MRN: [id number]  HPI 1 mo hx of nausea after eating.  No vomiting. No stomach pain.  Stool is darker but not blood in the stool.  Says feels disoriented when feels nauseated.  Hx of chronic steroids.  Very stressed at job s/p promotion. Take Aleve every day every day.  His wife is pregnant.  Nausea can last hours. Says it feels better once his stomach empties. Worse with sweet foods. No fever.  No vomiting. Still has GB.     Review of Systems     Objective:   Physical Exam  Constitutional: He is oriented to person, place, and time. He appears well-developed and well-nourished.  HENT:  Head: Normocephalic and atraumatic.  Mouth/Throat: Oropharynx is clear and moist.  Neck: Neck supple. No thyromegaly present.  Cardiovascular: Normal rate, regular rhythm and normal heart sounds.   Pulmonary/Chest: Effort normal and breath sounds normal.  Abdominal: Soft. Bowel sounds are normal. He exhibits no distension and no mass. There is no tenderness. There is no rebound and no guarding.  Lymphadenopathy:    He has no cervical adenopathy.  Neurological: He is alert and oriented to person, place, and time.  Skin: Skin is warm and dry.  Psychiatric: He has a normal mood and affect. His behavior is normal.          Assessment & Plan:  Nausea - most likely secondary to gastritis or possibly a gastric ulcer-because he has also had dark stools I would like to check a CBC today to make sure there is no sign of iron deficiency. We discussed backing off the Aleve for a while and trying Tylenol arthritis which she has never tried. We will go ahead and start a PPI. I gave him 10 days worth to start. One has a prescription to his pharmacy for omeprazole 40 mg. I discussed the typically treatment for 6-8 weeks and if at that point he is doing very well but we will decrease his dose to every other day. Though if he needs to  continue the Aleve daily for chronic pain relief from his joint pain and we may need to consider putting him on a PPI prophylactically. I also gave him a handout and reviewed foods to avoid that worsen peptic ulcer disease and reflux. It is not significantly better in one week to please call the office or refer him to gastroenterology for further evaluation. We'll also check his liver enzymes. Also consider checking H. pylori if he is not improved but right now I think I have a very good explanation for his symptoms, daily chronic NSAIDs.

## 2012-11-10 ENCOUNTER — Other Ambulatory Visit (HOSPITAL_COMMUNITY): Payer: Self-pay | Admitting: Rheumatology

## 2012-11-10 DIAGNOSIS — R911 Solitary pulmonary nodule: Secondary | ICD-10-CM

## 2012-11-12 ENCOUNTER — Ambulatory Visit (HOSPITAL_COMMUNITY)
Admission: RE | Admit: 2012-11-12 | Discharge: 2012-11-12 | Disposition: A | Discharge status: Home / Self Care | Payer: 59 | Source: Ambulatory Visit | Attending: Rheumatology | Admitting: Rheumatology

## 2012-11-12 DIAGNOSIS — R911 Solitary pulmonary nodule: Secondary | ICD-10-CM | POA: Insufficient documentation

## 2013-04-15 ENCOUNTER — Emergency Department (INDEPENDENT_AMBULATORY_CARE_PROVIDER_SITE_OTHER): Payer: 59

## 2013-04-15 ENCOUNTER — Encounter: Payer: Self-pay | Admitting: Emergency Medicine

## 2013-04-15 ENCOUNTER — Emergency Department (INDEPENDENT_AMBULATORY_CARE_PROVIDER_SITE_OTHER)
Admission: EM | Admit: 2013-04-15 | Discharge: 2013-04-15 | Disposition: A | Discharge status: Home / Self Care | Payer: 59 | Source: Home / Self Care | Attending: Family Medicine | Admitting: Family Medicine

## 2013-04-15 DIAGNOSIS — R059 Cough, unspecified: Secondary | ICD-10-CM

## 2013-04-15 DIAGNOSIS — R062 Wheezing: Secondary | ICD-10-CM

## 2013-04-15 DIAGNOSIS — J069 Acute upper respiratory infection, unspecified: Secondary | ICD-10-CM

## 2013-04-15 DIAGNOSIS — R05 Cough: Secondary | ICD-10-CM

## 2013-04-15 MED ORDER — DOXYCYCLINE HYCLATE 100 MG PO CAPS: 100.0000 mg | ORAL_CAPSULE | Freq: Two times a day (BID) | ORAL | Status: DC

## 2013-04-15 MED ORDER — BENZONATATE 200 MG PO CAPS: 200.0000 mg | ORAL_CAPSULE | Freq: Every day | ORAL | Status: DC

## 2013-04-15 NOTE — ED Provider Notes (Signed)
History    CSN: [id number] Arrival date & time 04/15/13  4098  None    Chief Complaint  Patient presents with  . Cough      HPI Comments: Patient complains of productive cough, SOB, fatigue, congestion, fever, and chills for 5 days, worse for 4 days.  Pt has a history of vasculitis and asthma.  He has felt worse over the past two days.    The history is provided by the patient.   Past Medical History  Diagnosis Date  . Tonsillar abscess   . Chronic prostatitis   . Sinusitis, chronic   . Allergy   . Asthma   . Pulmonary infiltrates   . Myalgia   . Vasculitis    Past Surgical History  Procedure Laterality Date  . Tympanoplasty    . Peritonsilar abscess    . Bronchoscopy  2011    TBBX ----inflammation   Family History  Problem Relation Age of Onset  . Heart attack      grandmother  . Hyperlipidemia Father   . Thyroid disease Father     hashimoto's thyroidistis  . Heart disease Paternal Grandfather    History  Substance Use Topics  . Smoking status: Never Smoker   . Smokeless tobacco: Not on file  . Alcohol Use: Yes    Review of Systems + sore throat + cough No pleuritic pain + wheezing + nasal congestion + post-nasal drainage ? sinus pain/pressure No itchy/red eyes No earache No hemoptysis + SOB + fever, + chills No nausea No vomiting No abdominal pain No diarrhea No urinary symptoms No skin rashes + fatigue + myalgias + headache Used OTC meds without relief  Allergies  Sulfonamide derivatives  Home Medications   Current Outpatient Rx  Name  Route  Sig  Dispense  Refill  . guaiFENesin (MUCINEX) 600 MG 12 hr tablet   Oral   Take 1,200 mg by mouth 2 (two) times daily.         . mometasone-formoterol (DULERA) 100-5 MCG/ACT AERO   Inhalation   Inhale 2 puffs into the lungs.         . benzonatate (TESSALON) 200 MG capsule   Oral   Take 1 capsule (200 mg total) by mouth at bedtime.   12 capsule   0   . doxycycline (VIBRAMYCIN)  100 MG capsule   Oral   Take 1 capsule (100 mg total) by mouth 2 (two) times daily.   20 capsule   0   . naproxen sodium (ANAPROX) 220 MG tablet   Oral   Take 220 mg by mouth daily.         [name] EXPIRED: omeprazole (PRILOSEC) 40 MG capsule   Oral   Take 1 capsule (40 mg total) by mouth daily.   30 capsule   2    BP 133/86  Pulse 80  Temp(Src) 98.4 F (36.9 C) (Oral)  Ht 6\' 3"  (1.905 m)  Wt 160 lb (72.576 kg)  BMI 20 kg/m2  SpO2 96% Physical Exam Nursing notes and Vital Signs reviewed. Appearance:  Patient appears healthy, stated age, and in no acute distress Eyes:  Pupils are equal, round, and reactive to light and accomodation.  Extraocular movement is intact.  Conjunctivae are not inflamed  Ears:  Canals normal.  Tympanic membranes normal.  Nose:  Mildly congested turbinates.  No sinus tenderness.   Pharynx:  Normal Neck:  Supple.  Slightly tender shotty posterior nodes are palpated bilaterally  Lungs:  Faint wheezes left base.   Breath sounds are equal.  Heart:  Regular rate and rhythm without murmurs, rubs, or gallops.  Abdomen:  Nontender without masses or hepatosplenomegaly.  Bowel sounds are present.  No CVA or flank tenderness.  Extremities:  No edema.  No calf tenderness Skin:  No rash present.   ED Course  Procedures    Dg Chest 2 View  04/15/2013   *RADIOLOGY REPORT*  Clinical Data: Upper respiratory infection.  Cough.  CHEST - 2 VIEW  Comparison: Chest x-ray 06/27/2010 and chest CT 11/12/2012.  Findings: The cardiac silhouette, mediastinal and hilar contours are within normal limits and stable.  The lungs demonstrate persistent hyperinflation.  Bronchitic type interstitial changes persist which could suggest reactive airways disease or bronchitis. Persistent apical scarring changes, unusual for age.  No pleural effusion or focal infiltrate.  The bony thorax is intact.  IMPRESSION:  1.  Hyperinflation and bronchitic changes. 2.  No focal infiltrates or effusions.  3.  Stable apical scarring changes.   Original Report Authenticated By: Rudie Meyer, M.D.   1. Acute upper respiratory infections of unspecified site; ? bronchitis     MDM  Begin doxycycline.  Prescription written for Benzonatate [facility]) to take at bedtime for night-time cough.  Take plain Mucinex (guaifenesin) twice daily for cough and congestion.  Increase fluid intake, rest. Continue Dulera, 2 inhalations twice daily. Stop all antihistamines for now, and other non-prescription cough/cold preparations.   Follow-up with family doctor if not improving 7 to 10 days.   Lattie Haw, MD 04/19/13 1247

## 2013-04-15 NOTE — ED Notes (Signed)
Productive cough, SOB, fatigue, congestion, fever, chills x 5 days, worse 4 days.  Pt has vasculitis and asthma and is use to cough, but not feeling bad

## 2013-08-13 ENCOUNTER — Ambulatory Visit (INDEPENDENT_AMBULATORY_CARE_PROVIDER_SITE_OTHER): Payer: 59 | Admitting: Family Medicine

## 2013-08-13 ENCOUNTER — Encounter: Payer: Self-pay | Admitting: Family Medicine

## 2013-08-13 VITALS — BP 121/77 | HR 101 | Wt 157.0 lb

## 2013-08-13 DIAGNOSIS — R110 Nausea: Secondary | ICD-10-CM

## 2013-08-13 DIAGNOSIS — M3130 Wegener's granulomatosis without renal involvement: Secondary | ICD-10-CM

## 2013-08-13 DIAGNOSIS — R1013 Epigastric pain: Secondary | ICD-10-CM

## 2013-08-13 DIAGNOSIS — H6980 Other specified disorders of Eustachian tube, unspecified ear: Secondary | ICD-10-CM

## 2013-08-13 DIAGNOSIS — H6983 Other specified disorders of Eustachian tube, bilateral: Secondary | ICD-10-CM

## 2013-08-13 MED ORDER — ALBUTEROL SULFATE HFA 108 (90 BASE) MCG/ACT IN AERS: 2.0000 | INHALATION_SPRAY | RESPIRATORY_TRACT | Status: DC | PRN

## 2013-08-13 NOTE — Progress Notes (Addendum)
Subjective:    Patient ID: Jesse Wong, male    DOB: [birth year], 32 y.o.   MRN: [id number]  HPI Has had more episodes of nausea and reflux over the last 2 months.  Says will occ wake up with pain and nausea in the epigastric area. Hasn't almost gone to the ED bc of it.  Says pain is like a cramping in the bowels.  No change in bowels. Says would feel better after an hour. Says has had nausea with eating, about 30-60 min.  Using pepcid [facility] for about a week-not sure if it's helping or not. He has not tried any other over-the-counter medications..  No fever, chills or sweats. No dysphagia . No blood in the stool or urine. No vomiting.  Eustachian tube dysfunction-he still having a lot of problems with eustachian tube dysfunction. He says even driving around town is usual for blood that he has a hard time getting in to pop. It's much worse when he flies on airplanes. He says he will try to get them to pop but cannot. He is not currently use any type of decongestant or nasal steroid spray. He does use a letter for his lungs. He does have a history of having 3 sets of tubes as a child.  He also has a history of Wegener's granulomatosis. Which has affected his lungs and his sinus cavities. He wonders if seeing an ear nose and throat specialist would be helpful.  Review of Systems  BP 121/77  Pulse 101  Wt 157 lb (71.215 kg)  BMI 19.62 kg/m2    No Active Allergies  Past Medical History  Diagnosis Date  . Tonsillar abscess   . Chronic prostatitis   . Sinusitis, chronic   . Allergy   . Asthma   . Pulmonary infiltrates   . Myalgia   . Vasculitis     Past Surgical History  Procedure Laterality Date  . Tympanoplasty    . Peritonsilar abscess    . Bronchoscopy  2011    TBBX ----inflammation    History   Social History  . Marital Status: Married    Spouse Name: N/A    Number of Children: N/A  . Years of Education: N/A   Occupational History  . [employer] [place]    Social History Main Topics  . Smoking status: Never Smoker   . Smokeless tobacco: Not on file  . Alcohol Use: Yes  . Drug Use: No  . 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   Other Topics Concern  . Not on file   Social History Narrative  . No narrative on file    Family History  Problem Relation Age of Onset  . Heart attack      grandmother  . Hyperlipidemia Father   . Thyroid disease Father     hashimoto's thyroidistis  . Heart disease Paternal Grandfather     Outpatient Encounter Prescriptions as of 08/13/2013  Medication Sig Dispense Refill  . albuterol (PROVENTIL HFA;VENTOLIN HFA) 108 (90 BASE) MCG/ACT inhaler Inhale 2 puffs into the lungs every 4 (four) hours as needed for wheezing or shortness of breath.  18 g  3  . mometasone-formoterol (DULERA) 100-5 MCG/ACT AERO Inhale 2 puffs into the lungs.      . [DISCONTINUED] albuterol (PROVENTIL HFA;VENTOLIN HFA) 108 (90 BASE) MCG/ACT inhaler Inhale 2 puffs into the lungs every 4 (four) hours as needed.  18 g  3  . [  DISCONTINUED] benzonatate (TESSALON) 200 MG capsule Take 1 capsule (200 mg total) by mouth at bedtime.  12 capsule  0  . [DISCONTINUED] doxycycline (VIBRAMYCIN) 100 MG capsule Take 1 capsule (100 mg total) by mouth 2 (two) times daily.  20 capsule  0  . [DISCONTINUED] guaiFENesin (MUCINEX) 600 MG 12 hr tablet Take 1,200 mg by mouth 2 (two) times daily.      . [DISCONTINUED] naproxen sodium (ANAPROX) 220 MG tablet Take 220 mg by mouth daily.      . [DISCONTINUED] omeprazole (PRILOSEC) 40 MG capsule Take 1 capsule (40 mg total) by mouth daily.  30 capsule  2   No facility-administered encounter medications on file as of 08/13/2013.          Objective:   Physical Exam  Constitutional: He is oriented to person, place, and time. He appears well-developed and well-nourished.  HENT:  Head: Normocephalic and atraumatic.  Right Ear: External ear normal.  Left  Ear: External ear normal.  Nose: Nose normal.  Mouth/Throat: Oropharynx is clear and moist.  TMs and canals are clear.   Eyes: Conjunctivae and EOM are normal. Pupils are equal, round, and reactive to light.  Neck: Neck supple. No thyromegaly present.  Cardiovascular: Normal rate and normal heart sounds.   Pulmonary/Chest: Effort normal and breath sounds normal.  Abdominal: Soft. Bowel sounds are normal. He exhibits no distension and no mass. There is no tenderness. There is no rebound and no guarding.  Lymphadenopathy:    He has no cervical adenopathy.  Neurological: He is alert and oriented to person, place, and time.  Skin: Skin is warm and dry.  Psychiatric: He has a normal mood and affect. His behavior is normal.          Assessment & Plan:  Epigastric pain, nausea-I would like to check liver enzymes as well as pancreatic enzymes. Consider gastritis versus a gastric ulcer. He has recently been taking an H2 blocker so I did not test for H. pylori today certainly this is a consideration. I did give him samples of dexilant to take over the weekend as weight are waiting to get blood work back. Also consider cholecystitis. We'll order an ultrasound to be done at [facility] per patient preference.  Eustachian tube dysfunction-recommend a trial of a nasal steroid spray. This certainly could be helpful but there no studies showing that any treatment is completely effective. Recommend referral to ENT. Ear exam is normal today which is reassuring.

## 2013-08-13 NOTE — Patient Instructions (Signed)
Bland diet.  Dexilant daily before breakfast.   We will call you with lab results.

## 2013-08-14 LAB — CBC WITH DIFFERENTIAL/PLATELET
Basophils Relative: 1 % (ref 0–1)
HCT: 47.7 % (ref 39.0–52.0)
Hemoglobin: 16.5 g/dL (ref 13.0–17.0)
Lymphs Abs: 1.6 10*3/uL (ref 0.7–4.0)
MCH: 30.8 pg (ref 26.0–34.0)
MCHC: 34.6 g/dL (ref 30.0–36.0)
Monocytes Absolute: 0.5 10*3/uL (ref 0.1–1.0)
Monocytes Relative: 8 % (ref 3–12)
Neutro Abs: 4.3 10*3/uL (ref 1.7–7.7)
Neutrophils Relative %: 65 % (ref 43–77)
RBC: 5.35 MIL/uL (ref 4.22–5.81)

## 2013-08-14 LAB — LIPASE: Lipase: 24 U/L (ref 0–75)

## 2013-08-14 LAB — COMPLETE METABOLIC PANEL WITH GFR
ALT: <8 U/L (ref 0–53)
AST: 7 U/L (ref 0–37)
Albumin: 4.6 g/dL (ref 3.5–5.2)
Alkaline Phosphatase: 64 U/L (ref 39–117)
Calcium: 9.4 mg/dL (ref 8.4–10.5)
Chloride: 100 mEq/L (ref 96–112)
Potassium: 3.7 mEq/L (ref 3.5–5.3)

## 2013-08-14 LAB — AMYLASE: Amylase: 36 U/L (ref 0–105)

## 2013-08-16 ENCOUNTER — Ambulatory Visit: Payer: Self-pay

## 2013-08-24 ENCOUNTER — Telehealth: Payer: Self-pay | Admitting: *Deleted

## 2013-08-24 MED ORDER — FLUTICASONE PROPIONATE 50 MCG/ACT NA SUSP: 2.0000 | Freq: Every day | NASAL | Status: DC

## 2013-08-24 MED ORDER — OMEPRAZOLE 40 MG PO CPDR: 40.0000 mg | DELAYED_RELEASE_CAPSULE | Freq: Every day | ORAL | Status: DC

## 2013-08-24 NOTE — Telephone Encounter (Signed)
I will send over a prescription for omeprazole. He has been on that before. It is now over-the-counter but we can certainly run it through the insurance to see if it might be cheaper than buying over-the-counter. I will also send over a prescription for generic Flonase. Time I've heart size for not sending that. I know he has been on on  Omnaris in the past but the generic Flonase should be a whole lot cheaper.

## 2013-08-24 NOTE — Telephone Encounter (Signed)
Pt states you were going to give him medications for GERD and a nasal spray. Please advise what I need to send to pharmacy.  Meyer Cory, LPN

## 2013-08-25 NOTE — Telephone Encounter (Signed)
Pt informed and is asking about the results for the [place]. States he had it done on Monday.  Meyer Cory, LPN

## 2013-08-25 NOTE — Telephone Encounter (Addendum)
Call patient: Ultrasound shows normal gallbladder wall thickness. No sign of inflammation. There is a area in the gallbladder that looks like it may be a polyp. Though it could be a stone. Please tell him start to do a lay on the report we had to actually call and get it,  they did not fax it to [place]. The polyp is fairly small so we have 2 options. One, if we feel that this is causing some of his discomfort with the gallbladder then we can opt to have the gallbladder removed. #2 if he is actually feeling a little bit better on the dexilant samples that I gave him then we can just repeat the ultrasound in one year to follow the polyp and make sure it is not growing.

## 2013-08-25 NOTE — Telephone Encounter (Signed)
LMOM for pt to return call for results. Meyer Cory, LPN

## 2013-08-25 NOTE — Telephone Encounter (Signed)
I have not seen results, over the Fax. They are not fully on EPIC so we will have to call to get the report.

## 2013-08-31 NOTE — Telephone Encounter (Signed)
LMOM for pt to return call.  Yasha Tibbett, LPN  

## 2013-09-02 MED ORDER — DEXLANSOPRAZOLE 60 MG PO CPDR: 60.0000 mg | DELAYED_RELEASE_CAPSULE | Freq: Every day | ORAL | Status: DC

## 2013-09-02 NOTE — Addendum Note (Signed)
Addended by: Deno Etienne on: 09/02/2013 03:07 PM   Modules accepted: Orders

## 2013-09-02 NOTE — Telephone Encounter (Signed)
Pt called back result and recommendations given he would like to continue on dexilant for now. rx sent to [facility] outpatient pharmacy.Loralee Pacas [place]

## 2013-12-31 ENCOUNTER — Other Ambulatory Visit: Payer: Self-pay | Admitting: Hematology and Oncology

## 2013-12-31 ENCOUNTER — Encounter (HOSPITAL_COMMUNITY)
Admission: RE | Admit: 2013-12-31 | Discharge: 2013-12-31 | Disposition: A | Discharge status: Home / Self Care | Payer: 59 | Source: Ambulatory Visit | Attending: Rheumatology | Admitting: Rheumatology

## 2013-12-31 ENCOUNTER — Ambulatory Visit (HOSPITAL_BASED_OUTPATIENT_CLINIC_OR_DEPARTMENT_OTHER): Payer: 59

## 2013-12-31 VITALS — BP 140/83 | HR 74 | Temp 97.0°F | Resp 18

## 2013-12-31 DIAGNOSIS — M3130 Wegener's granulomatosis without renal involvement: Secondary | ICD-10-CM

## 2013-12-31 MED ORDER — SODIUM CHLORIDE 0.9 % IV SOLN
Freq: Once | INTRAVENOUS | Status: AC
  Administered 2013-12-31: 16:00:00 via INTRAVENOUS

## 2013-12-31 MED ORDER — SODIUM CHLORIDE 0.9 % IV SOLN
1000.0000 mg | Freq: Once | INTRAVENOUS | Status: DC
  Administered 2013-12-31: 1000 mg via INTRAVENOUS
  Filled 2013-12-31: qty 8

## 2013-12-31 NOTE — Patient Instructions (Signed)
Methylprednisolone Solution for Injection What is this medicine? METHYLPREDNISOLONE (meth ill pred NISS oh lone) is a corticosteroid. It is commonly used to treat inflammation of the skin, joints, lungs, and other organs. Common conditions treated include asthma, allergies, and arthritis. It is also used for other conditions, such as blood disorders and diseases of the adrenal glands. This medicine may be used for other purposes; ask your health care provider or pharmacist if you have questions. COMMON BRAND NAME(S): A-Methapred, Solu-Medrol What should I tell my health care provider before I take this medicine? They need to know if you have any of these conditions: -cataracts or glaucoma -Cushing's syndrome -heart disease -high blood pressure -infection including tuberculosis -low calcium or potassium levels in the blood -recent surgery -seizures -stomach or intestinal disease, including colitis -threadworms -thyroid problems -an unusual or allergic reaction to methylprednisolone, corticosteroids, benzyl alcohol, other medicines, foods, dyes, or preservatives -pregnant or trying to get pregnant -breast-feeding How should I use this medicine? This medicine is for injection or infusion into a vein. It is also for injection into a muscle. It is given by a health care professional in a hospital or clinic setting. Talk to your pediatrician regarding the use of this medicine in children. While this drug may be prescribed for selected conditions, precautions do apply. Overdosage: If you think you have taken too much of this medicine contact a poison control center or emergency room at once. NOTE: This medicine is only for you. Do not share this medicine with others. What if I miss a dose? This does not apply. What may interact with this medicine? Do not take this medicine with any of the following medications: -mifepristone This medicine may also interact with the following  medications: -aspirin and aspirin-like medicines -cyclosporin -ketoconazole -phenobarbital -phenytoin -rifampin -tacrolimus -troleandomycin -vaccines -warfarin This list may not describe all possible interactions. Give your health care provider a list of all the medicines, herbs, non-prescription drugs, or dietary supplements you use. Also tell them if you smoke, drink alcohol, or use illegal drugs. Some items may interact with your medicine. What should I watch for while using this medicine? Visit your doctor or health care professional for regular checks on your progress. If you are taking this medicine for a long time, carry an identification card with your name and address, the type and dose of your medicine, and your doctor's name and address. The medicine may increase your risk of getting an infection. Stay away from people who are sick. Tell your doctor or health care professional if you are around anyone with measles or chickenpox. You may need to avoid some vaccines. Talk to your health care provider for more information. If you are going to have surgery, tell your doctor or health care professional that you have taken this medicine within the last twelve months. Ask your doctor or health care professional about your diet. You may need to lower the amount of salt you eat. The medicine can increase your blood sugar. If you are a diabetic check with your doctor if you need help adjusting the dose of your diabetic medicine. What side effects may I notice from receiving this medicine? Side effects that you should report to your doctor or health care professional as soon as possible: -allergic reactions like skin rash, itching or hives, swelling of the face, lips, or tongue -bloody or tarry stools -changes in vision -eye pain or bulging eyes -fever, sore throat, sneezing, cough, or other signs of infection, wounds that will   not heal -increased thirst -irregular heartbeat -muscle  cramps -pain in hips, back, ribs, arms, shoulders, or legs -swelling of the ankles, feet, hands -trouble passing urine or change in the amount of urine -unusual bleeding or bruising -unusually weak or tired -weight gain or weight loss Side effects that usually do not require medical attention (report to your doctor or health care professional if they continue or are bothersome): -changes in emotions or moods -constipation or diarrhea -headache -irritation at site where injected -nausea, vomiting -skin problems, acne, thin and shiny skin -trouble sleeping -unusual hair growth on the face or body This list may not describe all possible side effects. Call your doctor for medical advice about side effects. You may report side effects to FDA at 1-800-FDA-1088. Where should I keep my medicine? This drug is given in a hospital or clinic and will not be stored at home. NOTE: This sheet is a summary. It may not cover all possible information. If you have questions about this medicine, talk to your doctor, pharmacist, or health care provider.  2014, [employer]vier/[employer] Standard. (2012-07-07 11:37:16)  

## 2014-02-23 ENCOUNTER — Other Ambulatory Visit: Payer: Self-pay | Admitting: Internal Medicine

## 2014-02-23 NOTE — Telephone Encounter (Signed)
I called patient at [phone] and left a message for him to call me back; patient needs to make and keep OV with CY. [facility] last until he is seen.

## 2014-03-01 ENCOUNTER — Telehealth: Payer: Self-pay | Admitting: Internal Medicine

## 2014-03-01 MED ORDER — MOMETASONE FURO-FORMOTEROL FUM 100-5 MCG/ACT IN AERO: 2.0000 | INHALATION_SPRAY | Freq: Two times a day (BID) | RESPIRATORY_TRACT | Status: DC

## 2014-03-01 NOTE — Telephone Encounter (Signed)
Jesse BaconKatie C Roy Snuffer, CMA at 02/23/2014 3:42 PM    Status: Signed        I called patient at [phone] and left a message for him to call me back; patient needs to make and keep OV with CY. [facility] last until he is seen.

## 2014-03-01 NOTE — Telephone Encounter (Signed)
Called spoke with pt. Appt scheduled and refill sent

## 2014-03-01 NOTE — Telephone Encounter (Signed)
Pt states Katie called him regarding a prescription.  Katie, Can you advise on this?

## 2014-03-04 ENCOUNTER — Encounter: Payer: Self-pay | Admitting: Family Medicine

## 2014-03-04 ENCOUNTER — Ambulatory Visit (INDEPENDENT_AMBULATORY_CARE_PROVIDER_SITE_OTHER): Payer: 59 | Admitting: Family Medicine

## 2014-03-04 VITALS — BP 122/74 | HR 77 | Wt 164.0 lb

## 2014-03-04 DIAGNOSIS — L821 Other seborrheic keratosis: Secondary | ICD-10-CM

## 2014-03-04 DIAGNOSIS — H61899 Other specified disorders of external ear, unspecified ear: Secondary | ICD-10-CM

## 2014-03-04 DIAGNOSIS — M3130 Wegener's granulomatosis without renal involvement: Secondary | ICD-10-CM

## 2014-03-04 DIAGNOSIS — M25569 Pain in unspecified knee: Secondary | ICD-10-CM

## 2014-03-04 NOTE — Progress Notes (Signed)
Subjective:    Patient ID: Jesse Wong, male    DOB: Dec 23, 1980, 33 y.o.   MRN: [id number]  HPI Bump on the right ear x 2-3 months. Occ painful and tender. Never comes ot a head. He also had another bout on the same year that her on and off. He says sometimes it gets tender and will come off and then seems to come back. 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  He's also been having some pain from his knees down to the top of his feet. He says it almost feels like a tightness or pressure sensation. There is a spot that he can press on his right lower legs and actually since a shooting pain into the top of his foot in a similar location of the sensation that he experiences. No actual numbness. No injury to the back or trauma or fall et Karie Sodacetera. It is bilateral. No worsening or alleviating factors. He does work on his feet most of the day. He has spoken with his rheumatologist about this and they have put him on steroids recently. Has seemed to help his symptoms.   Review of Systems     Objective:   Physical Exam  Constitutional: He is oriented to person, place, and time. He appears well-developed and well-nourished.  HENT:  Head: Normocephalic and atraumatic.    Neurological: He is alert and oriented to person, place, and time.  Skin: Skin is warm and dry.  Psychiatric: He has a normal mood and affect. His behavior is normal.          Assessment & Plan:  Seborrheic keratosis-gave reassurance of the benign lesion. But since it's causing some irritation from time to time recommended cryotherapy for full treatment.  Nodule on the cartilage- gave reassurance. Unsure if this is related to his Wegener's granulomatosis. Surly could be a granuloma. It doesn't appear to be a sebaceous cyst. Recommend referral to dermatology for further evaluation.  Bilateral lower extremity leg pain-unclear etiology. It is interesting that he did respond to steroids as  far as his pain is concerned. This could be related to his Wegener's though it could also be muscle still are related as it tends to respond to start as well. Discussed importance of wearing good supportive shoes and maybe even considering compression stockings to see if this helps with his daily leg pain that he's been experiencing. Reassured her that this does not feel like is coming from his back because it started abruptly and is bilateral. It is also not consistent with typical progression of peripheral neuropathy.  Cryotherapy Procedure Note  Pre-operative Diagnosis: Seborrheic keratosis  Post-operative Diagnosis: Seborrheic keratosis  Locations: right ear    Indications: pain, irritation  Anesthesia: not required    Procedure Details  Patient informed of risks (permanent scarring, infection, light or dark discoloration, bleeding, infection, weakness, numbness and recurrence of the lesion) and benefits of the procedure and verbal informed consent obtained.  The areas are treated with liquid nitrogen therapy, frozen until ice ball extended 2 mm beyond lesion, allowed to thaw, and treated again. The patient tolerated procedure well.  The patient was instructed on post-op care, warned that there may be blister formation, redness and pain. Recommend OTC analgesia as needed for pain.  Condition: Stable  Complications: none.  Plan: 1. Instructed to keep the area dry and covered for 24-48h and clean thereafter. 2. Warning signs of infection were reviewed.   3. Recommended that the  patient use OTC acetaminophen as needed for pain.  4. Return prn

## 2014-03-22 ENCOUNTER — Telehealth: Payer: Self-pay | Admitting: *Deleted

## 2014-03-22 DIAGNOSIS — M3130 Wegener's granulomatosis without renal involvement: Secondary | ICD-10-CM

## 2014-03-22 NOTE — Telephone Encounter (Signed)
Pt transferred to scheduling to make an appt. Order placed for dermatology referral for Wegener's granulomatosis.Jesse Wong

## 2014-03-28 ENCOUNTER — Ambulatory Visit: Payer: 59 | Admitting: Family Medicine

## 2014-04-08 ENCOUNTER — Ambulatory Visit (INDEPENDENT_AMBULATORY_CARE_PROVIDER_SITE_OTHER): Payer: 59 | Admitting: Internal Medicine

## 2014-04-08 ENCOUNTER — Encounter: Payer: Self-pay | Admitting: Internal Medicine

## 2014-04-08 VITALS — BP 118/72 | HR 78 | Ht 79.0 in | Wt 166.6 lb

## 2014-04-08 DIAGNOSIS — I776 Arteritis, unspecified: Secondary | ICD-10-CM

## 2014-04-08 DIAGNOSIS — L301 Dyshidrosis [pompholyx]: Secondary | ICD-10-CM

## 2014-04-08 DIAGNOSIS — J309 Allergic rhinitis, unspecified: Secondary | ICD-10-CM

## 2014-04-08 DIAGNOSIS — M3130 Wegener's granulomatosis without renal involvement: Secondary | ICD-10-CM

## 2014-04-08 DIAGNOSIS — J45909 Unspecified asthma, uncomplicated: Secondary | ICD-10-CM

## 2014-04-08 DIAGNOSIS — J4530 Mild persistent asthma, uncomplicated: Secondary | ICD-10-CM

## 2014-04-08 DIAGNOSIS — M810 Age-related osteoporosis without current pathological fracture: Secondary | ICD-10-CM

## 2014-04-08 MED ORDER — MOMETASONE FURO-FORMOTEROL FUM 100-5 MCG/ACT IN AERO: INHALATION_SPRAY | RESPIRATORY_TRACT | Status: DC

## 2014-04-08 MED ORDER — ALBUTEROL SULFATE HFA 108 (90 BASE) MCG/ACT IN AERS: 2.0000 | INHALATION_SPRAY | RESPIRATORY_TRACT | Status: DC | PRN

## 2014-04-08 NOTE — Assessment & Plan Note (Signed)
Watching for evidence of pulmonary vasculitis. Plan-pulmonary function test for comparison with prior, especially watching lung volumes and diffusion capacity. Refill Dulera inviting use twice daily or perhaps one puff twice daily with instructions. Rescue inhaler if needed. Update PFT for comparison.

## 2014-04-08 NOTE — Assessment & Plan Note (Signed)
Neti pot can be effective. Flonase may help some. He reports decision to avoid surgery which might make it worse

## 2014-04-08 NOTE — Patient Instructions (Addendum)
Order- bone densitometry   Dx corticosteroid therapy, osteoporosis  Order schedule PFT  Dx vasculitis  [facility] and albuterol HFA rescue inhaler sent. You can experiment with using the [facility] one or two puffs, once or twice daily.

## 2014-04-08 NOTE — Progress Notes (Signed)
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 Presented 2011 with nodular infiltrates and progressive weakness/neuropathy. Bronchoscoped 2011- Neg.  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 Has been treated with Rituxan anti-B Lymphocyte immune modulator and intermittent solumedrol/ prednisone. He had originally presented with nodular infiltrates and rapidly progressive profound weakness which have been much improved. Last steroid therapy was in March.  Perennial sinus stuffiness treated with Flonase. Sudafed for plan trips . Neti pot productive of thick mucopurulent secretions and occasional blood. He had cleared with 1 month cleocin/ Dr Lazarus SalinesWolicki 2011. Morning cough productive yellow sputum. He is using Dulera 200, 2 puffs each morning and occasional rescue inhaler. Notes occasional wheezes he first lies down. Any viral illness goes to his chest and makes him rapidly quite congested with hacking cough. Occasional skin rash including scaling and small vesicles on his hands. He returns now seeking clarification of his pulmonary status, concerned about possibility of progressive vasculitis in his lung. PFT 05/09/10- Mild restriction, normal flows, normal DLCO, insignificant response to bronchodilator. FVC 4.91/73%, FEV1 4.19/83%, FEV1/FVC 0.85, FEF 25-75% 0.85, TLC 78%, DLCO 113%  Prior to Admission medications   Medication Sig Start Date End Date Taking? Authorizing Nachmen Mansel  albuterol (PROVENTIL HFA;VENTOLIN HFA) 108 (90 BASE) MCG/ACT inhaler Inhale 2 puffs into the lungs every 4 (four) hours as needed for wheezing or shortness of breath. 04/08/14  Yes Waymon Budgelinton D Young, MD  fluticasone (FLONASE) 50 MCG/ACT nasal spray Place 2 sprays into both nostrils daily. 08/24/13  Yes Agapito Gamesatherine D Metheney, MD  mometasone-formoterol [facility](DULERA) 100-5 MCG/ACT AERO 2 puffs then rinse mouth, twice daily 04/08/14  Yes Waymon Budgelinton D Young, MD   Naproxen Sodium (ALEVE PO) Take 1 tablet by mouth daily. Every other day   Yes Historical Neya Creegan, MD   Past Medical History  Diagnosis Date  . Tonsillar abscess   . Chronic prostatitis   . Sinusitis, chronic   . Allergy   . Asthma   . Pulmonary infiltrates   . Myalgia   . Vasculitis    Past Surgical History  Procedure Laterality Date  . Tympanoplasty    . Peritonsilar abscess    . Bronchoscopy  2011    TBBX ----inflammation   Family History  Problem Relation Age of Onset  . Heart attack      grandmother  . Hyperlipidemia Father   . Thyroid disease Father     hashimoto's thyroidistis  . Heart disease Paternal Grandfather    History   Social History  . Marital Status: Married    Spouse Name: N/A    Number of Children: N/A  . Years of Education: N/A   Occupational History  . [employer] Medicine [employer]   Social History Main Topics  . Smoking status: Never Smoker   . Smokeless tobacco: Not on file  . Alcohol Use: Yes  . Drug Use: No  . 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   Other Topics Concern  . Not on file   Social History Narrative  . No narrative on file   ROS-see HPI Constitutional:   No-   weight loss, night sweats, fevers, chills, fatigue, lassitude. HEENT:   No-  headaches, difficulty swallowing, tooth/dental problems, sore throat,       No-  sneezing, itching, ear ache, nasal congestion, post nasal drip,  CV:  No-   chest pain, orthopnea, PND,  swelling in lower extremities, anasarca,                                  dizziness, palpitations Resp: No-   shortness of breath with exertion or at rest.              No-   productive cough,  No non-productive cough,  No- coughing up of blood.              No-   change in color of mucus.  No- wheezing.   Skin: No-   rash or lesions. GI:  No-   heartburn, indigestion, abdominal pain, nausea, vomiting, diarrhea,                 change in bowel  habits, loss of appetite GU: No-   dysuria, change in color of urine, no urgency or frequency.  No- flank pain. MS:  No-   joint pain or swelling.  No- decreased range of motion.  No- back pain. Neuro-     nothing unusual Psych:  No- change in mood or affect. No depression or anxiety.  No memory loss.  OBJ- Physical Exam General- Alert, Oriented, Affect-appropriate, Distress- none acute, tall, thin Skin- +dyshydrotic eczema hands Lymphadenopathy- none Head- atraumatic            Eyes- Gross vision intact, PERRLA, conjunctivae and secretions clear            Ears- Hearing, canals-normal            Nose- +red/inflamed mucosa, no-Septal dev, mucus, polyps, erosion, perforation             Throat- Mallampati II , mucosa clear , drainage- none, tonsils- atrophic Neck- flexible , trachea midline, no stridor , thyroid nl, carotid no bruit Chest - symmetrical excursion , unlabored           Heart/CV- RRR , no murmur , no gallop  , no rub, nl s1 s2                           - JVD- none , edema- none, stasis changes- none, varices- none           Lung- clear to P&A, wheeze- none, cough- none , dullness-none, rub- none           Chest wall-  Abd- tender-no, distended-no, bowel sounds-present, HSM- no Br/ Gen/ Rectal- Not done, not indicated Extrem- cyanosis- none, clubbing, none, atrophy- none, strength- nl Neuro- grossly intact to observation

## 2014-04-08 NOTE — Assessment & Plan Note (Addendum)
Managed by Dr Dareen PianoAnderson/ Rheum- Rituxan, steroids Because of chronic cumulative steroid therapy I am ordering DEXA scan bone densitometry

## 2014-04-08 NOTE — Assessment & Plan Note (Signed)
Hands. Expect improvement when he is on steroids

## 2014-04-26 ENCOUNTER — Ambulatory Visit (INDEPENDENT_AMBULATORY_CARE_PROVIDER_SITE_OTHER)
Admission: RE | Admit: 2014-04-26 | Discharge: 2014-04-26 | Disposition: A | Discharge status: Home / Self Care | Payer: 59 | Source: Ambulatory Visit | Attending: Family Medicine | Admitting: Family Medicine

## 2014-04-26 DIAGNOSIS — M810 Age-related osteoporosis without current pathological fracture: Secondary | ICD-10-CM

## 2014-05-03 ENCOUNTER — Telehealth: Payer: Self-pay | Admitting: Family Medicine

## 2014-05-03 DIAGNOSIS — M810 Age-related osteoporosis without current pathological fracture: Secondary | ICD-10-CM

## 2014-05-03 NOTE — Telephone Encounter (Signed)
Please call patient. I was forwarded a copy of his bone density test results. We will need to do some additional blood work to look at vitamin D levels as well as testosterone levels. I will fax a lab slip downstairs. Please go at your convenience. I would then like to schedule a followup visit so that we can discuss treatment options.

## 2014-05-04 NOTE — Telephone Encounter (Signed)
LM on voicemail regarding bone density test and the labs that were ordered. I told him t hat he could call and speak with any one here regarding his tests but Dr. Linford ArnoldMetheney would like him to schedule an office follow up. Corliss SkainsJamie Keishawn Darsey, CMA

## 2014-05-05 ENCOUNTER — Telehealth: Payer: Self-pay | Admitting: Internal Medicine

## 2014-05-05 NOTE — Telephone Encounter (Signed)
LM x 1 to return call for results.   Notes Recorded by Waymon Budgelinton D Young, MD on 05/02/2014 at 8:46 AM Bone Density Test- The DEXA scan shows loss of bone calcium in the osteoporosis range. This means there is increased risk of fracture, and treatment is advised. This treatment should be managed by his primary physician. I will send Dr Linford ArnoldMetheney a copy of this message for her information. Please ask Mr Stacie GlazeSintay to contact her for recommendation.

## 2014-05-05 NOTE — Telephone Encounter (Signed)
Pt advised. Loi Rennaker, CMA  

## 2014-05-05 NOTE — Telephone Encounter (Signed)
Return call.Jesse Wong °

## 2014-05-12 NOTE — Progress Notes (Signed)
Quick Note:  Called and spoke to pt. Informed pt of results and recs per CY. Pt verbalized understanding and denied any other questions or concerns at this time. ______

## 2014-05-13 ENCOUNTER — Encounter: Payer: Self-pay | Admitting: Family Medicine

## 2014-05-13 ENCOUNTER — Ambulatory Visit (INDEPENDENT_AMBULATORY_CARE_PROVIDER_SITE_OTHER): Payer: 59 | Admitting: Family Medicine

## 2014-05-13 VITALS — BP 116/70 | HR 57 | Ht 79.0 in | Wt 160.0 lb

## 2014-05-13 DIAGNOSIS — M810 Age-related osteoporosis without current pathological fracture: Secondary | ICD-10-CM

## 2014-05-13 NOTE — Patient Instructions (Addendum)
Calcium 1200mg  per day for calcium.   Maintenance dose for vitamin 800 IU daily  We will call you if we need to up your vitamin D. Ibandronate monthly tablets What is this medicine? IBANDRONATE (i BAN droh nate) slows calcium loss from bones. It is used to treat osteoporosis in women past the age of menopause. This medicine may be used for other purposes; ask your health care provider or pharmacist if you have questions. COMMON BRAND NAME(S): Boniva What should I tell my health care provider before I take this medicine? They need to know if you have any of these conditions: -dental disease -esophageal, stomach, or intestine problems, like acid reflux or GERD -kidney disease -low blood calcium -low vitamin D -problems sitting or standing for 60 minutes -trouble swallowing -an unusual or allergic reaction to ibandronate, other medicines, foods, dyes, or preservatives -pregnant or trying to get pregnant -breast-feeding How should I use this medicine? You must take this medicine exactly as directed or you will lower the amount of medicine you absorb into your body or you may cause yourself harm. Take your dose by mouth first thing in the morning, after you are up for the day. Do not eat or drink anything before you take this medicine. Swallow the tablet with a full glass (6 to 8 ounces) of plain water. Do not take this medicine with any other drink. Do not chew or crush the tablet. After taking this medicine, do not eat breakfast, drink, or take any other medicines or vitamins for at least 1 hour. Stand or sit up for at least 1 hour after taking this medicine. Do not lie down. Take this medicine on the same day every month. Do not take your medicine more often than directed. Talk to your pediatrician regarding the use of this medicine in children. Special care may be needed. Overdosage: If you think you have taken too much of this medicine contact a poison control center or emergency room at  once. NOTE: This medicine is only for you. Do not share this medicine with others. What if I miss a dose? If you miss a dose and your next dose is more than 7 days away then take the missed dose on the next morning you remember. Then take your next dose on your regular day of the month. If your next dose is due within the next 7 days then skip the missed dose. Do not take 2 tablets within 1 week of each other. Do not take double or extra doses. What may interact with this medicine? -aluminum hydroxide -antacids -aspirin -calcium supplements -drugs for inflammation like ibuprofen, naproxen, and others -iron supplements -magnesium supplements -vitamins with minerals This list may not describe all possible interactions. Give your health care provider a list of all the medicines, herbs, non-prescription drugs, or dietary supplements you use. Also tell them if you smoke, drink alcohol, or use illegal drugs. Some items may interact with your medicine. What should I watch for while using this medicine? Visit your doctor or health care professional for regular check ups. It may be some time before you see the benefit from this medicine. Do not stop taking your medicine unless your doctor tells you to. Your doctor may order blood tests and other tests to see how you are doing. You should make sure that you get enough calcium and vitamin D while you are taking this medicine. Discuss the foods you eat and the vitamins you take with your health care professional. Some  people who take this medicine have severe bone, joint, and/or muscle pain. This medicine may also increase your risk for a broken thigh bone. Tell your doctor right away if you have pain in your upper leg or groin. Tell your doctor if you have any pain that does not go away or that gets worse. What side effects may I notice from receiving this medicine? Side effects that you should report to your doctor or health care professional as soon as  possible: -allergic reactions such as skin rash or itching, hives, swelling of the face, lips, throat or tongue -black or tarry stools -change in vision -chest pain -heartburn or stomach pain -jaw pain, especially after dental work -redness, blistering, peeling, or loosening of the skin, including inside the mouth -trouble or pain when swallowing Side effects that usually do not require medical attention (report to your doctor or health care professional if they continue or are bothersome): -bone, muscle or joint pain -changes in taste -diarrhea or constipation -headache -nausea or vomiting -stomach gas or fullness This list may not describe all possible side effects. Call your doctor for medical advice about side effects. You may report side effects to FDA at 1-800-FDA-1088. Where should I keep my medicine? Keep out of the reach of children. Store at room temperature between 15 and 30 degrees C (59 and 86 degrees F). Throw away any unused medication after the expiration date. NOTE: This sheet is a summary. It may not cover all possible information. If you have questions about this medicine, talk to your doctor, pharmacist, or health care provider.  2015, Elsevier/Gold Standard. (2011-04-05 09:03:09)

## 2014-05-13 NOTE — Progress Notes (Signed)
   Subjective:    Patient ID: Jesse KyleBenjamin J Wong, male    DOB: 19-Nov-1980, 33 y.o.   MRN: [id number]  HPI Here to discuss osteoporosis diagnosis. He recently had a bone density test ordered through his his pulmonologist. Unfortunately, his T score was -2.6 in the femur. He has been on chronic steroids oral and inhaled. He is also a thin white male. He really doesn't eat or drink much dairy products   Review of Systems     Objective:   Physical Exam  Constitutional: He is oriented to person, place, and time. He appears well-developed and well-nourished.  HENT:  Head: Normocephalic and atraumatic.  Neurological: He is alert and oriented to person, place, and time.  Skin: Skin is warm and dry.  Psychiatric: He has a normal mood and affect. His behavior is normal.          Assessment & Plan:  Osteoporosis, male-discussed that we need to evaluate for vitamin D deficiency as well as testosterone deficiency. The most likely his osteoporosis is secondary to chronic steroid use. I want to make sure we corrected the underlying deficiencies as well. In the meantime go ahead and start calcium 1200 mg daily with vitamin D 800 international units daily for maintenance. He really does not get adequate calcium in his daily diet. We also discussed bisphosphonates as treatment option. We discussed the risks and benefits an additional handout provided. I would like his blood work back before we start something.

## 2014-05-14 LAB — VITAMIN D 25 HYDROXY (VIT D DEFICIENCY, FRACTURES): Vit D, 25-Hydroxy: 30 ng/mL (ref 30–89)

## 2014-05-16 LAB — TESTOSTERONE, FREE, TOTAL, SHBG
SEX HORMONE BINDING: 53 nmol/L (ref 13–71)
Testosterone, Free: 130.4 pg/mL (ref 47.0–244.0)
Testosterone-% Free: 1.7 % (ref 1.6–2.9)
Testosterone: 782 ng/dL (ref 300–890)

## 2014-05-18 ENCOUNTER — Other Ambulatory Visit: Payer: Self-pay | Admitting: Family Medicine

## 2014-05-18 MED ORDER — VITAMIN D 1000 UNITS PO TABS: 1000.0000 [IU] | ORAL_TABLET | Freq: Every day | ORAL | Status: AC

## 2014-05-18 MED ORDER — ALENDRONATE SODIUM 70 MG PO TABS: 70.0000 mg | ORAL_TABLET | ORAL | Status: DC

## 2014-06-07 ENCOUNTER — Other Ambulatory Visit: Payer: Self-pay | Admitting: Family Medicine

## 2014-06-08 ENCOUNTER — Ambulatory Visit (INDEPENDENT_AMBULATORY_CARE_PROVIDER_SITE_OTHER): Payer: 59 | Admitting: Internal Medicine

## 2014-06-08 ENCOUNTER — Encounter: Payer: Self-pay | Admitting: Internal Medicine

## 2014-06-08 VITALS — BP 124/68 | HR 61 | Ht 77.0 in | Wt 162.0 lb

## 2014-06-08 DIAGNOSIS — J328 Other chronic sinusitis: Secondary | ICD-10-CM

## 2014-06-08 DIAGNOSIS — J4530 Mild persistent asthma, uncomplicated: Secondary | ICD-10-CM

## 2014-06-08 DIAGNOSIS — J45909 Unspecified asthma, uncomplicated: Secondary | ICD-10-CM

## 2014-06-08 DIAGNOSIS — J324 Chronic pansinusitis: Secondary | ICD-10-CM

## 2014-06-08 DIAGNOSIS — M3130 Wegener's granulomatosis without renal involvement: Secondary | ICD-10-CM

## 2014-06-08 DIAGNOSIS — I776 Arteritis, unspecified: Secondary | ICD-10-CM

## 2014-06-08 LAB — PULMONARY FUNCTION TEST
DL/VA % pred: 113 %
DL/VA: 5.7 ml/min/mmHg/L
DLCO UNC % PRED: 94 %
DLCO UNC: 39.31 ml/min/mmHg
FEF 25-75 POST: 4.54 L/s
FEF 25-75 PRE: 3.47 L/s
FEF2575-%Change-Post: 30 %
FEF2575-%PRED-POST: 91 %
FEF2575-%Pred-Pre: 70 %
FEV1-%Change-Post: 7 %
FEV1-%Pred-Post: 82 %
FEV1-%Pred-Pre: 77 %
FEV1-PRE: 4.1 L
FEV1-Post: 4.4 L
FEV1FVC-%Change-Post: 4 %
FEV1FVC-%PRED-PRE: 96 %
FEV6-%CHANGE-POST: 2 %
FEV6-%PRED-POST: 82 %
FEV6-%Pred-Pre: 80 %
FEV6-POST: 5.39 L
FEV6-Pre: 5.26 L
FEV6FVC-%CHANGE-POST: 0 %
FEV6FVC-%Pred-Post: 101 %
FEV6FVC-%Pred-Pre: 102 %
FVC-%CHANGE-POST: 2 %
FVC-%PRED-POST: 81 %
FVC-%PRED-PRE: 79 %
FVC-POST: 5.4 L
FVC-Pre: 5.26 L
PRE FEV1/FVC RATIO: 78 %
Post FEV1/FVC ratio: 81 %
Post FEV6/FVC ratio: 100 %
Pre FEV6/FVC Ratio: 100 %
RV % pred: 86 %
RV: 1.81 L
TLC % pred: 83 %
TLC: 6.94 L

## 2014-06-08 MED ORDER — BENZONATATE 200 MG PO CAPS: 200.0000 mg | ORAL_CAPSULE | Freq: Three times a day (TID) | ORAL | Status: DC | PRN

## 2014-06-08 NOTE — Progress Notes (Signed)
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 Presented 2011 with nodular infiltrates and progressive weakness/neuropathy. Bronchoscoped 2011- Neg.  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 Has been treated with Rituxan anti-B Lymphocyte immune modulator and intermittent solumedrol/ prednisone. He had originally presented with nodular infiltrates and rapidly progressive profound weakness which have been much improved. Last steroid therapy was in March.  Perennial sinus stuffiness treated with Flonase. Sudafed for plan trips . Neti pot productive of thick mucopurulent secretions and occasional blood. He had cleared with 1 month cleocin/ Dr Lazarus SalinesWolicki 2011. Morning cough productive yellow sputum. He is using Dulera 200, 2 puffs each morning and occasional rescue inhaler. Notes occasional wheezes he first lies down. Any viral illness goes to his chest and makes him rapidly quite congested with hacking cough. Occasional skin rash including scaling and small vesicles on his hands. He returns now seeking clarification of his pulmonary status, concerned about possibility of progressive vasculitis in his lung. PFT 05/09/10- Mild restriction, normal flows, normal DLCO, insignificant response to bronchodilator. 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 Presented 2011 with nodular infiltrates and progressive weakness/neuropathy. FOLLOWS FOR: review PFT.  Pt c/o increased throat irritation, prod cough with yellow/brown/white mucus, worse in mornings.   PFT 06/08/2014-normal lung volumes, minimal slowing and small airway flows with response to bronchodilator, normal diffusion Throat irritated from frequent professional lectures. Mild cough. Occasional wheeze. Dulera helps but does not  last 12 hours.rarely uses rescue inhaler. Postnasal drip.  ROS-see HPI Constitutional:   No-   weight loss, night sweats, fevers, chills, fatigue, lassitude. HEENT:   No-  headaches, difficulty swallowing, tooth/dental problems, sore throat,       No-  sneezing, itching, ear ache, nasal congestion, post nasal drip,  CV:  No-   chest pain, orthopnea, PND, swelling in lower extremities, anasarca,                                  dizziness, palpitations Resp: No-   shortness of breath with exertion or at rest.              No-   productive cough,  + non-productive cough,  No- coughing up of blood.              No-   change in color of mucus.  + wheezing.   Skin: No-   rash or lesions. GI:  No-   heartburn, indigestion, abdominal pain, nausea, vomiting,  GU:  MS:  No-   joint pain or swelling.   Neuro-     nothing unusual Psych:  No- change in mood or affect. No depression or anxiety.  No memory loss.  OBJ- Physical Exam General- Alert, Oriented, Affect-appropriate, Distress- none acute, tall, thin Skin- +dyshydrotic eczema hands Lymphadenopathy- none Head- atraumatic            Eyes- Gross vision intact, PERRLA, conjunctivae and secretions clear            Ears- Hearing, canals-normal            Nose- +red/inflamed mucosa, no-Septal dev, mucus, polyps, erosion, perforation             Throat- Mallampati II , mucosa clear , drainage- none, tonsils-  atrophic Neck- flexible , trachea midline, no stridor , thyroid nl, carotid no bruit Chest - symmetrical excursion , unlabored           Heart/CV- RRR , no murmur , no gallop  , no rub, nl s1 s2                           - JVD- none , edema- none, stasis changes- none, varices- none           Lung-  wheeze+mild, coughsign dry , dullness-none, rub- none           Chest wall-  Abd-  Br/ Gen/ Rectal- Not done, not indicated Extrem- cyanosis- none, clubbing, none, atrophy- none, strength- nl Neuro- grossly intact to observation

## 2014-06-08 NOTE — Progress Notes (Signed)
PFT done today. 

## 2014-06-08 NOTE — Patient Instructions (Signed)
Sample Dulera 200    2 puffs then rinse mouth, twice daily  Next try sample Breo Ellipta   1 puff, then rinse mouth, once daily  Compare these with you current Dulera 100  Script for benzonatate perles/ Tessalon for cough as needed  Sample Dymista nasal spray   1-2 puffs each nostril once daily at bedtime      Can later increase to twice daily if needed

## 2014-06-11 NOTE — Assessment & Plan Note (Signed)
Ongoing process requiring anti-inflammatory therapy

## 2014-06-11 NOTE — Assessment & Plan Note (Signed)
Aware of postnasal drip but is not having significant sinus inflammation symptoms Plan-try changing Flonase to Dymista nasal spray

## 2014-06-11 NOTE — Assessment & Plan Note (Signed)
On exam breath sounds are coarse and somewhat wheezy. I am pleased pulmonary function scores are as good as they are. Plan-he was using Dulera once a day and will increase to twice a day with 200 strength. Refill benzonatate. [facility]

## 2014-07-11 ENCOUNTER — Telehealth: Payer: Self-pay | Admitting: Internal Medicine

## 2014-07-11 MED ORDER — MOMETASONE FURO-FORMOTEROL FUM 200-5 MCG/ACT IN AERO: 2.0000 | INHALATION_SPRAY | Freq: Two times a day (BID) | RESPIRATORY_TRACT | Status: DC

## 2014-07-11 NOTE — Telephone Encounter (Signed)
Per 06/08/14 OV; Patient Instructions      Sample Dulera 200    2 puffs then rinse mouth, twice daily Next try sample Breo Ellipta   1 puff, then rinse mouth, once daily Compare these with you current Dulera 100  --   Called spoke with pt. He reports the dulera 200 mcg works better for him and would like this to be called in. Please advise CDY thanks

## 2014-07-11 NOTE — Telephone Encounter (Signed)
Ok to send Rx Dulera 200, 2 puffs then rinse mouth, twice daily

## 2014-07-11 NOTE — Telephone Encounter (Signed)
Called pt. Aware RX has been called in. Nothing further needed 

## 2014-08-30 ENCOUNTER — Ambulatory Visit (INDEPENDENT_AMBULATORY_CARE_PROVIDER_SITE_OTHER)
Admission: RE | Admit: 2014-08-30 | Discharge: 2014-08-30 | Disposition: A | Discharge status: Home / Self Care | Payer: 59 | Source: Ambulatory Visit | Attending: Internal Medicine | Admitting: Internal Medicine

## 2014-08-30 ENCOUNTER — Encounter: Payer: Self-pay | Admitting: Internal Medicine

## 2014-08-30 ENCOUNTER — Ambulatory Visit (INDEPENDENT_AMBULATORY_CARE_PROVIDER_SITE_OTHER): Payer: 59 | Admitting: Internal Medicine

## 2014-08-30 VITALS — BP 126/86 | HR 65 | Ht 79.0 in | Wt 162.5 lb

## 2014-08-30 DIAGNOSIS — J324 Chronic pansinusitis: Secondary | ICD-10-CM

## 2014-08-30 DIAGNOSIS — M3130 Wegener's granulomatosis without renal involvement: Secondary | ICD-10-CM

## 2014-08-30 DIAGNOSIS — J4540 Moderate persistent asthma, uncomplicated: Secondary | ICD-10-CM

## 2014-08-30 MED ORDER — ALBUTEROL SULFATE (2.5 MG/3ML) 0.083% IN NEBU: 2.5000 mg | INHALATION_SOLUTION | Freq: Four times a day (QID) | RESPIRATORY_TRACT | Status: DC | PRN

## 2014-08-30 MED ORDER — FLUTICASONE-SALMETEROL 500-50 MCG/DOSE IN AEPB: 1.0000 | INHALATION_SPRAY | Freq: Two times a day (BID) | RESPIRATORY_TRACT | Status: DC

## 2014-08-30 MED ORDER — COMPRESSOR/NEBULIZER MISC: Status: AC

## 2014-08-30 MED ORDER — FLUTICASONE FUROATE-VILANTEROL 200-25 MCG/INH IN AEPB: 1.0000 | INHALATION_SPRAY | Freq: Every day | RESPIRATORY_TRACT | Status: DC

## 2014-08-30 NOTE — Progress Notes (Signed)
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 Presented 2011 with nodular infiltrates and progressive weakness/neuropathy. Bronchoscoped 2011- Neg.  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 Has been treated with Rituxan anti-B Lymphocyte immune modulator and intermittent solumedrol/ prednisone. He had originally presented with nodular infiltrates and rapidly progressive profound weakness which have been much improved. Last steroid therapy was in March.  Perennial sinus stuffiness treated with Flonase. Sudafed for plan trips . Neti pot productive of thick mucopurulent secretions and occasional blood. He had cleared with 1 month cleocin/ Dr Lazarus SalinesWolicki 2011. Morning cough productive yellow sputum. He is using Dulera 200, 2 puffs each morning and occasional rescue inhaler. Notes occasional wheezes he first lies down. Any viral illness goes to his chest and makes him rapidly quite congested with hacking cough. Occasional skin rash including scaling and small vesicles on his hands. He returns now seeking clarification of his pulmonary status, concerned about possibility of progressive vasculitis in his lung. PFT 05/09/10- Mild restriction, normal flows, normal DLCO, insignificant response to bronchodilator. 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 Presented 2011 with nodular infiltrates and progressive weakness/neuropathy. FOLLOWS FOR: review PFT.  Pt c/o increased throat irritation, prod cough with yellow/brown/white mucus, worse in mornings.   PFT 06/08/2014-normal lung volumes, minimal slowing and small airway flows with response to bronchodilator, normal diffusion Throat irritated from frequent professional lectures. Mild cough. Occasional wheeze. Dulera helps but does not  last 12 hours.rarely uses rescue inhaler. Postnasal drip.  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 Presented 2011 with nodular infiltrates and progressive weakness/neuropathy. 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 Patient still has sob at times, he still has cough with mucus (yellowish), minor wheeze and no chest tightness.he is now slowly resolving a cold. Managed by rheumatology taking Humira/Dr. Caralee AtesAndrews instead of prednisone. Also continues Dulera 200 which works well. He feels he needs both.  Elwin SleightDulera is not covered by insurance .He thinks Elwin SleightDulera has worked better [employer] Symbicort. Denies need for antibiotic. Chronic nasal stuffiness without blood.  Has had flu vaccine.  ROS-see HPI Constitutional:   No-   weight loss, night sweats, fevers, chills, fatigue, lassitude. HEENT:   No-  headaches, difficulty swallowing, tooth/dental problems, sore throat,       No-  sneezing, itching, ear ache,+ nasal congestion, post nasal drip,  CV:  No-   chest pain, orthopnea, PND, swelling in lower extremities, anasarca,                                  dizziness, palpitations Resp: No-   shortness of breath with exertion or at rest.              No-   productive cough,  + non-productive cough,  No- coughing up of blood.              No-   change in color of mucus.  + wheezing.   Skin: No-   rash or lesions. GI:  No-   heartburn, indigestion, abdominal pain, nausea, vomiting,  GU:  MS:  No-   joint pain or swelling.   Neuro-     nothing unusual  Psych:  No- change in mood or affect. No depression or anxiety.  No memory loss.  OBJ- Physical Exam General- Alert, Oriented, Affect-appropriate, Distress- none acute, +all, thin Skin- +dyshydrotic eczema hands Lymphadenopathy- none Head- atraumatic            Eyes- Gross vision intact, PERRLA, conjunctivae and  secretions clear            Ears- Hearing, canals-normal            Nose- +turbinate edema, no-Septal dev, mucus, polyps, erosion, perforation             Throat- Mallampati II , mucosa clear , drainage- none, tonsils- atrophic Neck- flexible , trachea midline, no stridor , thyroid nl, carotid no bruit Chest - symmetrical excursion , unlabored           Heart/CV- RRR , no murmur , no gallop  , no rub, nl s1 s2                           - JVD- none , edema- none, stasis changes- none, varices- none           Lung-  wheeze+mild, coughsign dry , dullness-none, rub- none           Chest wall-  Abd-  Br/ Gen/ Rectal- Not done, not indicated Extrem- cyanosis- none, clubbing, none, atrophy- none, strength- nl Neuro- grossly intact to observation

## 2014-08-30 NOTE — Patient Instructions (Signed)
Order- CXR   Dx Wegener's Granulomatosis  Sample x 2 Breo Ellipta 200     1 puff then rinse mouth well, once daily  Sample x 2 Advair 500    1 puff then  Rinse mouth well, twice daily  Script for nebulizer compressor and for albuterol nebulizer solution. If your drug store can't provide these, please let us know

## 2014-08-31 NOTE — Assessment & Plan Note (Signed)
He feels he has significantly benefited by using Dymista. We discussed trying and it albuterol nebulizer. Because insurance isn't covering Dulera 200, and Symbicort didn't work, we are giving sample of debris of 200 and sample of Advair 500 to use up one and then the other. He will work with his formulary to determine best cost effectiveness for him

## 2014-08-31 NOTE — Assessment & Plan Note (Signed)
Persistent rhinitis now especially after a head cold, slowly improving. No visible polyps and noted purulent discharge.

## 2014-08-31 NOTE — Assessment & Plan Note (Signed)
Now on Humira. We talked about steroid side effects

## 2014-09-08 ENCOUNTER — Encounter: Payer: Self-pay | Admitting: Family Medicine

## 2014-09-08 DIAGNOSIS — L4059 Other psoriatic arthropathy: Secondary | ICD-10-CM | POA: Insufficient documentation

## 2014-11-14 ENCOUNTER — Telehealth: Payer: Self-pay | Admitting: Internal Medicine

## 2014-11-14 ENCOUNTER — Other Ambulatory Visit: Payer: Self-pay | Admitting: Family Medicine

## 2014-11-14 MED ORDER — MOMETASONE FURO-FORMOTEROL FUM 200-5 MCG/ACT IN AERO: 2.0000 | INHALATION_SPRAY | Freq: Two times a day (BID) | RESPIRATORY_TRACT | Status: DC

## 2014-11-14 NOTE — Telephone Encounter (Signed)
Ok Dulera 200   # 1, 2 puffs then rinse mouth, twice daily  Ref prn

## 2014-11-14 NOTE — Telephone Encounter (Signed)
Spoke with pt, he is aware of recs.  Med sent to pharmacy.  Nothing further needed.

## 2014-11-14 NOTE — Telephone Encounter (Signed)
Spoke with pt. At his last OV, CY gave him samples of Breo and Advair to try. Neither inhaler helped, they actually made his symptoms worse. He would like to go back on Dulera 200.  No Known Allergies  CY - please advise. Thanks.

## 2014-11-25 ENCOUNTER — Telehealth: Payer: Self-pay | Admitting: Internal Medicine

## 2014-11-25 NOTE — Telephone Encounter (Signed)
Received a fax from pharmacy that the dulera 200 will need PA done. Pt stated that he has tried breo and advair and these medications made his symptoms worse.  Waiting on fax to come to fax in triage.

## 2014-11-25 NOTE — Telephone Encounter (Signed)
Form have been filled out and placed on CY cart.  [facility] to follow up on forms being faxed.

## 2014-11-30 ENCOUNTER — Ambulatory Visit (INDEPENDENT_AMBULATORY_CARE_PROVIDER_SITE_OTHER): Payer: 59 | Admitting: Internal Medicine

## 2014-11-30 ENCOUNTER — Encounter: Payer: Self-pay | Admitting: Internal Medicine

## 2014-11-30 VITALS — BP 96/70 | HR 68 | Ht 79.0 in | Wt 160.4 lb

## 2014-11-30 DIAGNOSIS — J324 Chronic pansinusitis: Secondary | ICD-10-CM

## 2014-11-30 DIAGNOSIS — M3130 Wegener's granulomatosis without renal involvement: Secondary | ICD-10-CM

## 2014-11-30 NOTE — Assessment & Plan Note (Signed)
Nasal congestion evident today may be residual from his recent cold or more of a baseline pattern. If he inhales his steroid aerosols through his mouth but then exhales them through his nose, there might be some benefit to his nasal airway as well.

## 2014-11-30 NOTE — Assessment & Plan Note (Signed)
He feels steroids are the most active treatment. We discussed adding a separate steroid inhaler to his Dulera, hopefully to minimize systemic prednisone. Plan sample continue Dulera 200. Add Qvar 80 2 puffs twice daily with extra attention to mouth care.

## 2014-11-30 NOTE — Telephone Encounter (Signed)
lmtcb X1 to make pt aware.  Need to verify pharmacy that this needs to be sent to as there are 2 in his chart.

## 2014-11-30 NOTE — Telephone Encounter (Signed)
PA has been approved from 11-25-14 through 11-26-15. Thanks.

## 2014-11-30 NOTE — Progress Notes (Signed)
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 Presented 2011 with nodular infiltrates and progressive weakness/neuropathy. Bronchoscoped 2011- Neg.  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 Has been treated with Rituxan anti-B Lymphocyte immune modulator and intermittent solumedrol/ prednisone. He had originally presented with nodular infiltrates and rapidly progressive profound weakness which have been much improved. Last steroid therapy was in March.  Perennial sinus stuffiness treated with Flonase. Sudafed for plan trips . Neti pot productive of thick mucopurulent secretions and occasional blood. He had cleared with 1 month cleocin/ Dr Lazarus Salines 2011. Morning cough productive yellow sputum. He is using Dulera 200, 2 puffs each morning and occasional rescue inhaler. Notes occasional wheezes he first lies down. Any viral illness goes to his chest and makes him rapidly quite congested with hacking cough. Occasional skin rash including scaling and small vesicles on his hands. He returns now seeking clarification of his pulmonary status, concerned about possibility of progressive vasculitis in his lung. PFT 05/09/10- Mild restriction, normal flows, normal DLCO, insignificant response to bronchodilator. 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 Presented 2011 with nodular infiltrates and progressive weakness/neuropathy. FOLLOWS FOR: review PFT.  Pt c/o increased throat irritation, prod cough with yellow/brown/white mucus, worse in mornings.   PFT 06/08/2014-normal lung volumes, minimal slowing and small airway flows with response to bronchodilator, normal diffusion Throat irritated from frequent professional lectures. Mild cough. Occasional wheeze. Dulera helps but does not  last 12 hours.rarely uses rescue inhaler. Postnasal drip.  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 Presented 2011 with nodular infiltrates and progressive weakness/neuropathy. 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 Patient still has sob at times, he still has cough with mucus (yellowish), minor wheeze and no chest tightness.he is now slowly resolving a cold. Managed by rheumatology taking Humira/Dr. Caralee Ates instead of prednisone. Also continues Dulera 200 which works well. He feels he needs both.  [name] is not covered by insurance .He thinks [name] has worked better [employer]. Denies need for antibiotic. Chronic nasal stuffiness without blood.  Has had flu vaccine.  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 Presented 2011 with nodular infiltrates and progressive weakness/neuropathy. FOLLOWS FOR:  Pts breathing unchanged, still has sob and cough with clear to dark mucus., occasional wheezing. His rheumatologist/Dr. Dareen Piano gave burst of prednisone for chest cold to reduce inflammation. That is now completed. [name] works much better than others because it is not a powder. The powders irritate his airway. 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 He is having trouble with that. He thinks steroids are the most important part of his treatment. Or aware of increased dyspnea on exertion in the past 6 months. CXR 08/30/14-images reviewed with him IMPRESSION: Emphysematous and bronchitic changes consistent with COPD. Biapical scarring. No acute abnormalities. Electronically Signed  By: Ulyses Southward M.D.  On: 08/30/2014 13:37  ROS-see HPI Constitutional:   No-   weight loss, night sweats, fevers, chills, fatigue, lassitude. HEENT:   No-   headaches, difficulty swallowing, tooth/dental problems, sore throat,       No-  sneezing, itching, ear ache,+ nasal congestion, post nasal drip,  CV:  No-   chest pain,  orthopnea, PND, swelling in lower extremities, anasarca,                                  dizziness, palpitations Resp: +shortness of breath with exertion or at rest.              No-   productive cough,  + non-productive cough,  No- coughing up of blood.              No-   change in color of mucus.  + wheezing.   Skin: No-   rash or lesions. GI:  No-   heartburn, indigestion, abdominal pain, nausea, vomiting,  GU:  MS:  No-   joint pain or swelling.   Neuro-     nothing unusual Psych:  No- change in mood or affect. No depression or anxiety.  No memory loss.  OBJ- Physical Exam General- Alert, Oriented, Affect-appropriate, Distress- none acute, +all, thin Skin- no rash Lymphadenopathy- none Head- atraumatic            Eyes- Gross vision intact, PERRLA, conjunctivae and secretions clear            Ears- Hearing, canals-normal            Nose- +turbinate edema, no-Septal dev, mucus, polyps, erosion, perforation             Throat- Mallampati II , mucosa clear , drainage- none, tonsils- atrophic Neck- flexible , trachea midline, no stridor , thyroid nl, carotid no bruit Chest - symmetrical excursion , unlabored           Heart/CV- RRR , no murmur , no gallop  , no rub, nl s1 s2                           - JVD- none , edema- none, stasis changes- none, varices- none           Lung-  wheeze-none, cough-none , dullness-none, rub- none           Chest wall-  Abd-  Br/ Gen/ Rectal- Not done, not indicated Extrem- cyanosis- none, clubbing, none, atrophy- none, strength- nl Neuro- grossly intact to observation

## 2014-11-30 NOTE — Patient Instructions (Signed)
Continue Dulera 200  Add steroid inhaler samples Qvar 80   2 puffs, then rinse mouth, twice daily. You can use this right on top of the [facility], with your spacer tube

## 2015-02-13 ENCOUNTER — Telehealth: Payer: Self-pay | Admitting: Internal Medicine

## 2015-02-13 MED ORDER — BECLOMETHASONE DIPROPIONATE 80 MCG/ACT IN AERS: 2.0000 | INHALATION_SPRAY | Freq: Two times a day (BID) | RESPIRATORY_TRACT | Status: DC

## 2015-02-13 NOTE — Telephone Encounter (Signed)
Rx has been sent in. Pt is aware. Nothing further was needed. 

## 2015-04-12 ENCOUNTER — Telehealth: Payer: Self-pay | Admitting: Family

## 2015-04-12 DIAGNOSIS — R197 Diarrhea, unspecified: Secondary | ICD-10-CM

## 2015-04-12 NOTE — Progress Notes (Signed)
Based on what you shared with me it looks like you have a serious condition that should be evaluated in a face to face office visit.  You need to have stool samples tested for different causes of your diarrhea. Please follow up with your primary care provider.  If you are having a true medical emergency please call 911.  If you need an urgent face to face visit, [facility] has four urgent care centers for your convenience.  [facility]  [phone] Get Driving Directions Find a Provider at this Location  [address] [place], [place] [address] . 8 am to 8 pm Monday-Friday . 9 am to 7 pm Saturday-Sunday  . [facility] at [facility]  [phone] Get Driving Directions Find a Provider at this Location  [address], [address] [place], [place] [address] . 8 am to 8 pm Monday-Friday . 9 am to 6 pm Saturday . 11 am to 6 pm Sunday   . [facility] at [facility]  [phone] Get Driving Directions  [address] [place], [place] [address] . 8 am to 8 pm Monday-Friday . 9 am to 4 pm Saturday-Sunday   . [facility] (a walk in primary care provider)  [phone]  Get Driving Directions Find a Provider at this Location  [address] [place], [place] [address] . 8 am to 8:30 pm Monday-Thursday . 8 am to 6 pm Friday . 8 am to 4 pm Saturday-Sunday   Your e-visit answers were reviewed by a board certified advanced clinical practitioner to complete your personal care plan.  Depending on the condition, your plan could have included both over the counter or prescription medications.  You will get an e-mail in the next two days asking about your experience.  I hope that your e-visit has been valuable and will speed your recovery . Thank you for choosing an e-visit.

## 2015-04-27 ENCOUNTER — Ambulatory Visit: Payer: 59 | Admitting: Family Medicine

## 2015-05-15 ENCOUNTER — Other Ambulatory Visit: Payer: Self-pay | Admitting: Family Medicine

## 2015-05-23 ENCOUNTER — Ambulatory Visit: Payer: 59 | Admitting: Internal Medicine

## 2015-05-30 ENCOUNTER — Encounter: Payer: Self-pay | Admitting: Internal Medicine

## 2015-05-30 ENCOUNTER — Ambulatory Visit (INDEPENDENT_AMBULATORY_CARE_PROVIDER_SITE_OTHER): Payer: 59 | Admitting: Internal Medicine

## 2015-05-30 VITALS — BP 110/70 | HR 66 | Ht 79.0 in | Wt 165.2 lb

## 2015-05-30 DIAGNOSIS — J4530 Mild persistent asthma, uncomplicated: Secondary | ICD-10-CM

## 2015-05-30 DIAGNOSIS — M3130 Wegener's granulomatosis without renal involvement: Secondary | ICD-10-CM

## 2015-05-30 DIAGNOSIS — L4050 Arthropathic psoriasis, unspecified: Secondary | ICD-10-CM | POA: Diagnosis not present

## 2015-05-30 NOTE — Progress Notes (Signed)
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 Presented 2011 with nodular infiltrates and progressive weakness/neuropathy. Bronchoscoped 2011- Neg.  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 Has been treated with Rituxan anti-B Lymphocyte immune modulator and intermittent solumedrol/ prednisone. He had originally presented with nodular infiltrates and rapidly progressive profound weakness which have been much improved. Last steroid therapy was in March.  Perennial sinus stuffiness treated with Flonase. Sudafed for plan trips . Neti pot productive of thick mucopurulent secretions and occasional blood. He had cleared with 1 month cleocin/ Dr Lazarus Salines 2011. Morning cough productive yellow sputum. He is using Dulera 200, 2 puffs each morning and occasional rescue inhaler. Notes occasional wheezes he first lies down. Any viral illness goes to his chest and makes him rapidly quite congested with hacking cough. Occasional skin rash including scaling and small vesicles on his hands. He returns now seeking clarification of his pulmonary status, concerned about possibility of progressive vasculitis in his lung. PFT 05/09/10- Mild restriction, normal flows, normal DLCO, insignificant response to bronchodilator. 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 Presented 2011 with nodular infiltrates and progressive weakness/neuropathy. FOLLOWS FOR: review PFT.  Pt c/o increased throat irritation, prod cough with yellow/brown/white mucus, worse in mornings.   PFT 06/08/2014-normal lung volumes, minimal slowing and small airway flows with response to bronchodilator, normal diffusion Throat irritated from frequent professional lectures. Mild cough. Occasional wheeze. Dulera helps but does not  last 12 hours.rarely uses rescue inhaler. Postnasal drip.  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 Presented 2011 with nodular infiltrates and progressive weakness/neuropathy. 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 Patient still has sob at times, he still has cough with mucus (yellowish), minor wheeze and no chest tightness.he is now slowly resolving a cold. Managed by rheumatology taking Humira/Dr. Caralee Ates instead of prednisone. Also continues Dulera 200 which works well. He feels he needs both.  [name] is not covered by insurance .He thinks [name] has worked better [employer]. Denies need for antibiotic. Chronic nasal stuffiness without blood.  Has had flu vaccine.  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 Presented 2011 with nodular infiltrates and progressive weakness/neuropathy. FOLLOWS FOR:  Pts breathing unchanged, still has sob and cough with clear to dark mucus., occasional wheezing. His rheumatologist/Dr. Dareen Piano gave burst of prednisone for chest cold to reduce inflammation. That is now completed. [name] works much better than others because it is not a powder. The powders irritate his airway. 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 He is having trouble with that. He thinks steroids are the most important part of his treatment. Or aware of increased dyspnea on exertion in the past 6 months. CXR 08/30/14-images reviewed with him IMPRESSION: Emphysematous and bronchitic changes consistent with COPD. Biapical scarring. No acute abnormalities. Electronically Signed  By: Ulyses Southward M.D.  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 Presented 2011 with nodular infiltrates and progressive weakness/neuropathy. FOLLOWS FOR: Pt states he was started on Pred 10 mg QD by Dr Elmer Ramp /Rheumatologist to help with breathing;seems to be "holding" at this time. Continues  to use [facility]  We reviewed his trial of rituxan with Solu-Medrol and then Humira. 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 Has considered second [facility]ology opinion at [facility]. Prednisone has made a significant improvement in his ability to talk on a sustained basis without need to cough. Tessalon some help. Has continued Dulera. Feels well currently on 10 mg daily for the past 2 weeks. He tried Qvar off and on for a month with no benefit. Nebulized albuterol has helped when needed. Continues rescue inhaler twice daily which we discussed.  ROS-see HPI  Constitutional:   No-   weight loss, night sweats, fevers, chills, fatigue, lassitude. HEENT:   No-  headaches, difficulty swallowing, tooth/dental problems, sore throat,       No-  sneezing, itching, ear ache,+ nasal congestion, post nasal drip,  CV:  No-   chest pain, orthopnea, PND, swelling in lower extremities, anasarca,                                                     izziness, palpitations Resp: +shortness of breath with exertion or at rest.              No-   productive cough,  + non-productive cough,  No- coughing up of blood.              No-   change in color of mucus.  + wheezing.   Skin: No-   rash or lesions. GI:  No-   heartburn, indigestion, abdominal pain, nausea, vomiting,  GU:  MS:  No-   joint pain or swelling.   Neuro-     nothing unusual Psych:  No- change in mood or affect. No depression or anxiety.  No memory loss.  OBJ- Physical Exam General- Alert, Oriented, Affect-appropriate, Distress- none acute, tall, thin Skin- no rash Lymphadenopathy- none Head- atraumatic            Eyes- Gross vision intact, PERRLA, conjunctivae and secretions clear             Ears- Hearing, canals-normal            Nose- +turbinate edema, no-Septal dev, mucus, polyps, erosion, perforation             Throat- Mallampati II , mucosa clear , drainage- none, tonsils- atrophic Neck- flexible , trachea midline, no stridor , thyroid nl, carotid no bruit Chest - symmetrical excursion , unlabored           Heart/CV- RRR , no murmur , no gallop  , no rub, nl s1 s2                           - JVD- none , edema- none, stasis changes- none, varices- none           Lung-  wheeze-none, cough-none , dullness-none, rub- none           Chest wall-  Abd-  Br/ Gen/ Rectal- Not done, not indicated Extrem- cyanosis- none, clubbing, none, atrophy- none, strength- nl Neuro- grossly intact to observation

## 2015-05-30 NOTE — Patient Instructions (Signed)
Ok to try off Dulera to see what happens  Sample Anoro Ellipta   1 puff, once daily  To try if you feel you should be back on something

## 2015-05-31 DIAGNOSIS — L4050 Arthropathic psoriasis, unspecified: Secondary | ICD-10-CM | POA: Insufficient documentation

## 2015-05-31 NOTE — Assessment & Plan Note (Signed)
We compared side effects of prednisone with other immunosuppressive therapies being considered. 10 mg prednisone is relatively benign for short midterm use while options are assessed. Treatment decisions will be made between him and his rheumatologist.

## 2015-05-31 NOTE — Assessment & Plan Note (Signed)
Not clear how much benefit Dulera is adding while he is on prednisone. He is not coughing or wheezing much on 10 mg daily. We did talk about steroid sparing strategies and the use of inhalers without steroid while he is on systemic prednisone. Plan-he may not need any additional therapy now but we will give him sample of Anoro Ellipta which he can try if he finds he is needing something, before going back to [facility].

## 2015-06-16 ENCOUNTER — Ambulatory Visit (INDEPENDENT_AMBULATORY_CARE_PROVIDER_SITE_OTHER): Payer: 59 | Admitting: Family Medicine

## 2015-06-16 ENCOUNTER — Encounter: Payer: Self-pay | Admitting: Family Medicine

## 2015-06-16 VITALS — BP 119/71 | HR 54 | Temp 97.6°F | Wt 164.0 lb

## 2015-06-16 DIAGNOSIS — R197 Diarrhea, unspecified: Secondary | ICD-10-CM | POA: Diagnosis not present

## 2015-06-16 LAB — CBC WITH DIFFERENTIAL/PLATELET
Basophils Absolute: 0 10*3/uL (ref 0.0–0.1)
Basophils Relative: 0 % (ref 0–1)
EOS ABS: 0.2 10*3/uL (ref 0.0–0.7)
EOS PCT: 3 % (ref 0–5)
HCT: 45.5 % (ref 39.0–52.0)
Hemoglobin: 15.4 g/dL (ref 13.0–17.0)
Lymphocytes Relative: 13 % (ref 12–46)
Lymphs Abs: 1 10*3/uL (ref 0.7–4.0)
MCH: 31.6 pg (ref 26.0–34.0)
MCHC: 33.8 g/dL (ref 30.0–36.0)
MCV: 93.4 fL (ref 78.0–100.0)
MONO ABS: 0.5 10*3/uL (ref 0.1–1.0)
MONOS PCT: 6 % (ref 3–12)
MPV: 9.6 fL (ref 8.6–12.4)
Neutro Abs: 6.2 10*3/uL (ref 1.7–7.7)
Neutrophils Relative %: 78 % — ABNORMAL HIGH (ref 43–77)
Platelets: 273 10*3/uL (ref 150–400)
RBC: 4.87 MIL/uL (ref 4.22–5.81)
RDW: 13.5 % (ref 11.5–15.5)
WBC: 7.9 10*3/uL (ref 4.0–10.5)

## 2015-06-16 LAB — COMPLETE METABOLIC PANEL WITH GFR
ALT: 14 U/L (ref 9–46)
AST: 12 U/L (ref 10–40)
Albumin: 4 g/dL (ref 3.6–5.1)
Alkaline Phosphatase: 61 U/L (ref 40–115)
BILIRUBIN TOTAL: 0.9 mg/dL (ref 0.2–1.2)
BUN: 12 mg/dL (ref 7–25)
CO2: 30 mmol/L (ref 20–31)
CREATININE: 0.93 mg/dL (ref 0.60–1.35)
Calcium: 9.1 mg/dL (ref 8.6–10.3)
Chloride: 100 mmol/L (ref 98–110)
GLUCOSE: 82 mg/dL (ref 65–99)
Potassium: 4.1 mmol/L (ref 3.5–5.3)
Sodium: 140 mmol/L (ref 135–146)
TOTAL PROTEIN: 7.3 g/dL (ref 6.1–8.1)

## 2015-06-16 LAB — SEDIMENTATION RATE: Sed Rate: 4 mm/hr (ref 0–15)

## 2015-06-16 MED ORDER — DIPHENOXYLATE-ATROPINE 2.5-0.025 MG PO TABS: 1.0000 | ORAL_TABLET | Freq: Four times a day (QID) | ORAL | Status: DC | PRN

## 2015-06-16 NOTE — Progress Notes (Signed)
Subjective:    Patient ID: Jesse Wong, male    DOB: 1981/07/05, 34 y.o.   MRN: [id number]  HPI  34 year old male with a history of Wegener's granulomatosis  complains of watery diarrhea that occurs every few days for the last 3 months. His lapsed episode was last night. He denies any significant abdominal pain or nausea with it. No fevers or chills. He has not traveled outside of the country only regionally. He's been mostly using Imodium to control his symptoms. He denies any blood in the stool. He denies any dietary triggers.  No camping or fresh water exposure. No major odor to stool.   Went to UC and was given Xifaxin.  Didn't help much. Given Suprax. Helped temporarily and then returned. He denies any upper GI symptoms such as sore throat or problems swallowing.  Review of Systems  BP 119/71 mmHg  Pulse 54  Temp(Src) 97.6 F (36.4 C)  Wt 164 lb (74.39 kg)  SpO2 98%    No Known Allergies  Past Medical History  Diagnosis Date  . Tonsillar abscess   . Chronic prostatitis   . Sinusitis, chronic   . Allergy   . Asthma   . Pulmonary infiltrates   . Myalgia   . Vasculitis     Past Surgical History  Procedure Laterality Date  . Tympanoplasty    . Peritonsilar abscess    . Bronchoscopy  2011    TBBX ----inflammation    Social History   Social History  . Marital Status: Married    Spouse Name: N/A  . Number of Children: N/A  . Years of Education: N/A   Occupational History  . Nuclear Medicine    Social History Main Topics  . Smoking status: Never Smoker   . Smokeless tobacco: Never Used  . Alcohol Use: Yes  . Drug Use: No  . 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   Other Topics Concern  . Not on file   Social History Narrative    Family History  Problem Relation Age of Onset  . Heart attack      grandmother  . Hyperlipidemia Father   . Thyroid disease Father    hashimoto's thyroidistis  . Heart disease Paternal Grandfather     Outpatient Encounter Prescriptions as of 06/16/2015  Medication Sig  . albuterol (PROVENTIL HFA;VENTOLIN HFA) 108 (90 BASE) MCG/ACT inhaler Inhale 2 puffs into the lungs every 4 (four) hours as needed for wheezing or shortness of breath.  [name] albuterol (PROVENTIL) (2.5 MG/3ML) 0.083% nebulizer solution Take 3 mLs (2.5 mg total) by nebulization every 6 (six) hours as needed for wheezing or shortness of breath.  [name] alendronate (FOSAMAX) 70 MG tablet TAKE 1 TABLET BY MOUTH EVERY 7 DAYS. TAKE WITH A FULL GLASS OF WATER ON AN EMPTY STOMACH.  . beclomethasone (QVAR) 80 MCG/ACT inhaler Inhale 2 puffs into the lungs 2 (two) times daily.  . cholecalciferol (VITAMIN D) 1000 UNITS tablet Take 1 tablet (1,000 Units total) by mouth daily.  . diphenoxylate-atropine (LOMOTIL) 2.5-0.025 MG per tablet Take 1-2 tablets by mouth 4 (four) times daily as needed for diarrhea or loose stools.  . fluticasone (FLONASE) 50 MCG/ACT nasal spray INSTILL 2 SPRAY INTO EACH NOSTRIL DAILY.  . mometasone-formoterol (DULERA) 200-5 MCG/ACT AERO Inhale 2 puffs into the lungs 2 (two) times daily.  . Nebulizers (COMPRESSOR/NEBULIZER) MISC Use as direccted  . predniSONE (DELTASONE) 10 MG  tablet Take 10 mg by mouth daily with breakfast.  . [DISCONTINUED] benzonatate (TESSALON) 200 MG capsule Take 1 capsule (200 mg total) by mouth 3 (three) times daily as needed for cough.   No facility-administered encounter medications on file as of 06/16/2015.          Objective:   Physical Exam  Constitutional: He is oriented to person, place, and time. He appears well-developed and well-nourished.  HENT:  Head: Normocephalic and atraumatic.  Neck: Neck supple. No thyromegaly present.  Cardiovascular: Normal rate, regular rhythm and normal heart sounds.   Pulmonary/Chest: Effort normal and breath sounds normal.  Abdominal: Soft. Bowel sounds are normal. He exhibits no  distension and no mass. There is no tenderness. There is no rebound and no guarding.  Musculoskeletal: He exhibits no edema.  Lymphadenopathy:    He has no cervical adenopathy.  Neurological: He is alert and oriented to person, place, and time.  Skin: Skin is warm and dry.  Psychiatric: He has a normal mood and affect. His behavior is normal.        Assessment & Plan:  Diarrhea-unclear etiology. No fevers chills etc. to suggest a viral illness per se. At this point is been going on for 3 months. Consider evaluation for Salmonella Shigella etc. 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 difficile. I also like to check liver enzymes and a CBC. He has not had any blood in the stool which is reassuring. And he has been on prednisone recently. He is now down to 5 mg. If this were some type of autoimmune process somehow related to his vasculitis I would suspect that the prednisone would actually be helping him. For the short-arm I'll give him a prescription of Lomotil since the Imodium does not seem to be effective.

## 2015-06-17 LAB — ACUTE HEP PANEL AND HEP B SURFACE AB
HCV Ab: NEGATIVE
HEP B C IGM: NONREACTIVE
HEP B S AG: NEGATIVE
Hep A IgM: NONREACTIVE
Hep B S Ab: NEGATIVE

## 2015-06-23 LAB — CLOSTRIDIUM DIFFICILE BY PCR: Toxigenic C. Difficile by PCR: DETECTED — CR

## 2015-06-23 MED ORDER — METRONIDAZOLE 500 MG PO TABS: 500.0000 mg | ORAL_TABLET | Freq: Three times a day (TID) | ORAL | Status: DC

## 2015-06-23 NOTE — Addendum Note (Signed)
Addended by: Nani Gasser D on: 06/23/2015 03:42 PM   Modules accepted: Orders

## 2015-06-26 LAB — STOOL CULTURE

## 2015-07-03 ENCOUNTER — Telehealth: Payer: Self-pay | Admitting: *Deleted

## 2015-07-03 MED ORDER — VANCOMYCIN HCL 125 MG PO CAPS: 125.0000 mg | ORAL_CAPSULE | Freq: Four times a day (QID) | ORAL | Status: DC

## 2015-07-03 NOTE — Telephone Encounter (Signed)
We should change to vancomycin. New Rx sent to [facility].  Make sure to complete full 10 days.

## 2015-07-03 NOTE — Telephone Encounter (Signed)
Pt called and stated that the abx were helping up until day 7. He is still experiencing diarrhea.  Would like to know what he should do. Will fwd to pcp for advice.Loralee Pacas [place]

## 2015-07-05 NOTE — Telephone Encounter (Signed)
Pt advised.[name]  

## 2015-07-18 ENCOUNTER — Telehealth: Payer: Self-pay | Admitting: *Deleted

## 2015-07-18 DIAGNOSIS — R197 Diarrhea, unspecified: Secondary | ICD-10-CM

## 2015-07-18 NOTE — Telephone Encounter (Signed)
Pt called and lvm stating that he is still having issues with diarrhea. Spoke w/Dr. Linford Arnold and she wants him to be retested. Will order lab for stool cx and c-diff.Loralee Pacas Lynetta]

## 2015-07-19 ENCOUNTER — Other Ambulatory Visit: Payer: Self-pay | Admitting: Family Medicine

## 2015-07-19 ENCOUNTER — Other Ambulatory Visit: Payer: Self-pay

## 2015-07-19 DIAGNOSIS — R197 Diarrhea, unspecified: Secondary | ICD-10-CM

## 2015-07-20 LAB — CLOSTRIDIUM DIFFICILE BY PCR

## 2015-07-23 LAB — STOOL CULTURE

## 2015-09-29 ENCOUNTER — Encounter: Payer: Self-pay | Admitting: Family Medicine

## 2015-09-29 ENCOUNTER — Ambulatory Visit (INDEPENDENT_AMBULATORY_CARE_PROVIDER_SITE_OTHER): Payer: 59 | Admitting: Family Medicine

## 2015-09-29 VITALS — BP 124/67 | HR 67 | Wt 165.0 lb

## 2015-09-29 DIAGNOSIS — R197 Diarrhea, unspecified: Secondary | ICD-10-CM | POA: Diagnosis not present

## 2015-09-29 DIAGNOSIS — M3130 Wegener's granulomatosis without renal involvement: Secondary | ICD-10-CM

## 2015-09-29 NOTE — Progress Notes (Signed)
Subjective:    Patient ID: Jesse Wong, male    DOB: 22-Nov-1980, 34 y.o.   MRN: [id number]  HPI   Had diarrhea 3 months ago and had a positive c diff test. He was treated and got better quickly but as soon as he comes off antibiotics his sxs returns.     Sometimes is is watery mucous.  Had some food allergy testing that was neg about 3 years ago.  Says will stil have diarrhea once every 1-2 weeks . No pain but gets intense noise and gurgling in his stomach. No urgency . Last time tried to check his stool but the sample wasn't liqui enough to run back in Sept.   Granulomatosis with calling angitis/Wegener's-he is actually planning on going on rituximab. He did express interest in seeing a new rheumatologist as well today. His previous rheumatologist left the practice and he was assigned to someone new. He would like to consider Dr. Alben DeedsBeekman, [place] [facility].   Review of Systems  BP 124/67 mmHg  Pulse 67  Wt 165 lb (74.844 kg)  SpO2 99%    No Known Allergies  Past Medical History  Diagnosis Date  . Tonsillar abscess   . Chronic prostatitis   . Sinusitis, chronic   . Allergy   . Asthma   . Pulmonary infiltrates   . Myalgia   . Vasculitis [facility](HCC)     Past Surgical History  Procedure Laterality Date  . Tympanoplasty    . Peritonsilar abscess    . Bronchoscopy  2011    TBBX ----inflammation    Social History   Social History  . Marital Status: Married    Spouse Name: N/A  . Number of Children: N/A  . Years of Education: N/A   Occupational History  . [employer]   Social History Main Topics  . Smoking status: Never Smoker   . Smokeless tobacco: Never Used  . Alcohol Use: Yes  . Drug Use: No  . 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   Other Topics Concern  . Not on file   Social History Narrative    Family History  Problem Relation Age of Onset  . Heart  attack      grandmother  . Hyperlipidemia Father   . Thyroid disease Father     hashimoto's thyroidistis  . Heart disease Paternal Grandfather     Outpatient Encounter Prescriptions as of 09/29/2015  Medication Sig  . albuterol (PROVENTIL HFA;VENTOLIN HFA) 108 (90 BASE) MCG/ACT inhaler Inhale 2 puffs into the lungs every 4 (four) hours as needed for wheezing or shortness of breath.  [name]. albuterol (PROVENTIL) (2.5 MG/3ML) 0.083% nebulizer solution Take 3 mLs (2.5 mg total) by nebulization every 6 (six) hours as needed for wheezing or shortness of breath.  [name]. alendronate (FOSAMAX) 70 MG tablet TAKE 1 TABLET BY MOUTH EVERY 7 DAYS. TAKE WITH A FULL GLASS OF WATER ON AN EMPTY STOMACH.  . beclomethasone (QVAR) 80 MCG/ACT inhaler Inhale 2 puffs into the lungs 2 (two) times daily.  . cholecalciferol (VITAMIN D) 1000 UNITS tablet Take 1 tablet (1,000 Units total) by mouth daily.  . fluticasone (FLONASE) 50 MCG/ACT nasal spray INSTILL 2 SPRAY INTO EACH NOSTRIL DAILY.  . mometasone-formoterol (DULERA) 200-5 MCG/ACT AERO Inhale 2 puffs into the lungs 2 (two) times daily.  . Nebulizers (COMPRESSOR/NEBULIZER) MISC Use as direccted  . predniSONE (DELTASONE)  10 MG tablet Take 15 mg by mouth daily with breakfast.  . [DISCONTINUED] diphenoxylate-atropine (LOMOTIL) 2.5-0.025 MG per tablet Take 1-2 tablets by mouth 4 (four) times daily as needed for diarrhea or loose stools.  . [DISCONTINUED] metroNIDAZOLE (FLAGYL) 500 MG tablet Take 1 tablet (500 mg total) by mouth 3 (three) times daily. X 10 days.  . [DISCONTINUED] predniSONE (DELTASONE) 10 MG tablet Take 5 mg by mouth daily with breakfast.   . [DISCONTINUED] vancomycin (VANCOCIN) 125 MG capsule Take 1 capsule (125 mg total) by mouth 4 (four) times daily.   No facility-administered encounter medications on file as of 09/29/2015.          Objective:   Physical Exam  Constitutional: He is oriented to person, place, and time. He appears well-developed and  well-nourished.  HENT:  Head: Normocephalic and atraumatic.  Cardiovascular: Normal rate, regular rhythm and normal heart sounds.   Pulmonary/Chest: Effort normal and breath sounds normal.  Neurological: He is alert and oriented to person, place, and time.  Skin: Skin is warm and dry.  Psychiatric: He has a normal mood and affect. His behavior is normal.          Assessment & Plan:  Chronic intermittant Diarrhea - his symptoms have persisted with diarrhea. We had tried to retest his stool about 2 months ago just to make sure that the C. difficile infection was completely cleared. Unfortunately they said that the sample was not liquid enough. The patient reports that the sample that he returned was very liquidy and quite watery actually. Unfortunately they did not run the test. We discussed trying to get another sample especially when the stool is more watery. Just to confirm whether not the infection has been completely cleared. The other consideration is that he could still have an underlying autoimmune disorder such as Crohn's disease or ulcerative colitis especially with his history of Wegener's arteritis. Recommend full colonoscopy for further evaluation. He had food allergy testing done about 3 years ago which was completely negative.  Wegener's-will refer to new rheumatologist.

## 2015-10-04 ENCOUNTER — Other Ambulatory Visit: Payer: Self-pay | Admitting: Family Medicine

## 2015-10-05 LAB — C. DIFFICILE GDH AND TOXIN A/B
C. difficile GDH: NOT DETECTED
C. difficile Toxin A/B: NOT DETECTED

## 2015-10-06 ENCOUNTER — Other Ambulatory Visit: Payer: Self-pay | Admitting: Internal Medicine

## 2015-10-06 DIAGNOSIS — M317 Microscopic polyangiitis: Secondary | ICD-10-CM

## 2015-10-09 ENCOUNTER — Encounter: Payer: Self-pay | Admitting: Internal Medicine

## 2015-10-09 ENCOUNTER — Ambulatory Visit (INDEPENDENT_AMBULATORY_CARE_PROVIDER_SITE_OTHER): Payer: 59 | Admitting: Internal Medicine

## 2015-10-09 VITALS — BP 122/68 | HR 62 | Ht 78.75 in | Wt 169.0 lb

## 2015-10-09 DIAGNOSIS — R197 Diarrhea, unspecified: Secondary | ICD-10-CM

## 2015-10-09 DIAGNOSIS — D899 Disorder involving the immune mechanism, unspecified: Secondary | ICD-10-CM | POA: Diagnosis not present

## 2015-10-09 DIAGNOSIS — M3130 Wegener's granulomatosis without renal involvement: Secondary | ICD-10-CM

## 2015-10-09 DIAGNOSIS — Z8619 Personal history of other infectious and parasitic diseases: Secondary | ICD-10-CM

## 2015-10-09 DIAGNOSIS — D849 Immunodeficiency, unspecified: Secondary | ICD-10-CM

## 2015-10-09 DIAGNOSIS — Z8719 Personal history of other diseases of the digestive system: Secondary | ICD-10-CM

## 2015-10-09 NOTE — Patient Instructions (Signed)
  You have been scheduled for a colonoscopy. Please follow written instructions given to you at your visit today.  Please pick up your over the counter prep supplies at the pharmacy. If you use inhalers (even only as needed), please bring them with you on the day of your procedure. Your physician has requested that you go to www.[email or web address].com and enter the access code given to you at your visit today. This web site gives a general overview about your procedure. However, you should still follow specific instructions given to you by our office regarding your preparation for the procedure.   Please purchase Florastor and take one daily.   I appreciate the opportunity to care for you. Stan Headarl Gessner, MD, [facility]

## 2015-10-09 NOTE — Progress Notes (Signed)
  Referred by: Agapito GamesMetheney, Catherine D, *  Subjective:    Patient ID: Jesse KyleBenjamin J Kruckenberg, male    DOB: June 30, 1981, 34 y.o.   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 difficile colitis in September, he was treated with metronidazole and then vancomycin. By enlarge his symptoms resolve these had intermittent episodic watery stools with normal stools in between. He does have Wegener's granulomatosis, and takes chronic prednisone. History of using Humira but not now. History of using azathioprine but not now. He said that he had a respiratory illness in May and that's when he started to have problems with diarrhea, I don't think he was on antibiotics but he had a prednisone increase burst and taper.  Wt Readings from Last 3 Encounters:  10/09/15 169 lb (76.658 kg)  09/29/15 165 lb (74.844 kg)  06/16/15 164 lb (74.39 kg)   GI review of systems is otherwise negative at this time other than some bloating and gas. He is active, he works full time plus [place] and does a lot of work around the farm.  rheumatologist is contemplating putting him on rituximab treatment for Wegener's. Medications, allergies, past medical history, past surgical history, family history and social history are reviewed and updated in the EMR.  Review of Systems As per history of present illness. All other review of systems are negative at this time.    Objective:   Physical Exam @BP  122/68 mmHg  Pulse 62  Ht 6' 6.75" (2 m)  Wt 169 lb (76.658 kg)  BMI 19.16 kg/m2@  General:  Well-developed, well-nourished and in no acute distress Eyes:  anicteric. ENT:   Mouth and posterior pharynx free of lesions.   Lungs: Clear to auscultation bilaterally. Heart:  S1S2, no rubs, murmurs, gallops. Abdomen:  soft, non-tender, no hepatosplenomegaly, hernia, or mass and BS+.  Rectal: deferred Lymph:  no cervical  adenopathy. Extremities:   no edema, cyanosis or clubbing Neuro:  A&O x 3.  Psych:  appropriate mood and  Affect.   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 All questions answered. The patient agrees to proceed.   Try Florastor qd  I appreciate the opportunity to care for this patient. Nadara Modec;METHENEY,CATHERINE, MD Alben DeedsJames Beekman, MD

## 2015-10-20 ENCOUNTER — Ambulatory Visit
Admission: RE | Admit: 2015-10-20 | Discharge: 2015-10-20 | Disposition: A | Discharge status: Home / Self Care | Payer: 59 | Source: Ambulatory Visit | Attending: Internal Medicine | Admitting: Internal Medicine

## 2015-10-20 DIAGNOSIS — M317 Microscopic polyangiitis: Secondary | ICD-10-CM

## 2015-10-24 ENCOUNTER — Encounter: Payer: Self-pay | Admitting: *Deleted

## 2015-10-27 ENCOUNTER — Encounter: Payer: Self-pay | Admitting: Family Medicine

## 2015-11-02 ENCOUNTER — Telehealth: Payer: Self-pay

## 2015-11-02 NOTE — Telephone Encounter (Signed)
Jesse Wong would [facility] by. Could you schedule this for patient? Thank you.

## 2015-11-03 NOTE — Telephone Encounter (Signed)
Arline AspCindy is aware and is working on this for the patient

## 2015-11-03 NOTE — Telephone Encounter (Signed)
There is not an order or a referral for an Ultrasound so I am unsure of what he is asking about.

## 2015-11-07 ENCOUNTER — Other Ambulatory Visit (HOSPITAL_COMMUNITY): Payer: Self-pay | Admitting: Internal Medicine

## 2015-11-08 ENCOUNTER — Other Ambulatory Visit: Payer: Self-pay | Admitting: *Deleted

## 2015-11-08 DIAGNOSIS — N261 Atrophy of kidney (terminal): Secondary | ICD-10-CM

## 2015-11-10 ENCOUNTER — Encounter (HOSPITAL_COMMUNITY)
Admission: RE | Admit: 2015-11-10 | Discharge: 2015-11-10 | Disposition: A | Discharge status: Home / Self Care | Payer: 59 | Source: Ambulatory Visit | Attending: Internal Medicine | Admitting: Internal Medicine

## 2015-11-10 ENCOUNTER — Encounter (HOSPITAL_COMMUNITY): Payer: Self-pay

## 2015-11-10 DIAGNOSIS — M317 Microscopic polyangiitis: Secondary | ICD-10-CM | POA: Insufficient documentation

## 2015-11-10 MED ORDER — ACETAMINOPHEN 500 MG PO TABS
1000.0000 mg | ORAL_TABLET | ORAL | Status: DC
  Administered 2015-11-10: 1000 mg via ORAL
  Filled 2015-11-10: qty 2

## 2015-11-10 MED ORDER — LORATADINE 10 MG PO TABS
10.0000 mg | ORAL_TABLET | ORAL | Status: DC
  Administered 2015-11-10: 10 mg via ORAL
  Filled 2015-11-10: qty 1

## 2015-11-10 MED ORDER — SODIUM CHLORIDE 0.9 % IV SOLN
INTRAVENOUS | Status: DC
  Administered 2015-11-10: 08:00:00 via INTRAVENOUS

## 2015-11-10 MED ORDER — SODIUM CHLORIDE 0.9 % IV SOLN
750.0000 mg | INTRAVENOUS | Status: DC
  Administered 2015-11-10: 800 mg via INTRAVENOUS
  Filled 2015-11-10: qty 50

## 2015-11-10 MED ORDER — METHYLPREDNISOLONE SODIUM SUCC 125 MG IJ SOLR
100.0000 mg | INTRAMUSCULAR | Status: DC
  Administered 2015-11-10: 100 mg via INTRAVENOUS
  Filled 2015-11-10: qty 1.6

## 2015-11-10 NOTE — Progress Notes (Signed)
Pt. Denies any problems, requested  Rate to stay at /hr.

## 2015-11-10 NOTE — Progress Notes (Signed)
Rate continues at /hr, Pt. Denies any problems.

## 2015-11-10 NOTE — Discharge Instructions (Signed)
Rituximab injection What is this medicine? RITUXIMAB (ri TUX i mab) is a monoclonal antibody. It is used commonly to treat non-Hodgkin lymphoma and other conditions. It is also used to treat rheumatoid arthritis (RA). In RA, this medicine slows the inflammatory process and help reduce joint pain and swelling. This medicine is often used with other cancer or arthritis medications. This medicine may be used for other purposes; ask your health care provider or pharmacist if you have questions. What should I tell my health care provider before I take this medicine? They need to know if you have any of these conditions: -blood disorders -heart disease -history of hepatitis B -infection (especially a virus infection such as chickenpox, cold sores, or herpes) -irregular heartbeat -kidney disease -lung or breathing disease, like asthma -lupus -an unusual or allergic reaction to rituximab, mouse proteins, other medicines, foods, dyes, or preservatives -pregnant or trying to get pregnant -breast-feeding How should I use this medicine? This medicine is for infusion into a vein. It is administered in a hospital or clinic by a specially trained health care professional. A special MedGuide will be given to you by the pharmacist with each prescription and refill. Be sure to read this information carefully each time. Talk to your pediatrician regarding the use of this medicine in children. This medicine is not approved for use in children. Overdosage: If you think you have taken too much of this medicine contact a poison control center or emergency room at once. NOTE: This medicine is only for you. Do not share this medicine with others. What if I miss a dose? It is important not to miss a dose. Call your doctor or health care professional if you are unable to keep an appointment. What may interact with this medicine? -cisplatin -medicines for blood pressure -some other medicines for  arthritis -vaccines This list may not describe all possible interactions. Give your health care provider a list of all the medicines, herbs, non-prescription drugs, or dietary supplements you use. Also tell them if you smoke, drink alcohol, or use illegal drugs. Some items may interact with your medicine. What should I watch for while using this medicine? Report any side effects that you notice during your treatment right away, such as changes in your breathing, fever, chills, dizziness or lightheadedness. These effects are more common with the first dose. Visit your prescriber or health care professional for checks on your progress. You will need to have regular blood work. Report any other side effects. The side effects of this medicine can continue after you finish your treatment. Continue your course of treatment even though you feel ill unless your doctor tells you to stop. Call your doctor or health care professional for advice if you get a fever, chills or sore throat, or other symptoms of a cold or flu. Do not treat yourself. This drug decreases your body's ability to fight infections. Try to avoid being around people who are sick. This medicine may increase your risk to bruise or bleed. Call your doctor or health care professional if you notice any unusual bleeding. Be careful brushing and flossing your teeth or using a toothpick because you may get an infection or bleed more easily. If you have any dental work done, tell your dentist you are receiving this medicine. Avoid taking products that contain aspirin, acetaminophen, ibuprofen, naproxen, or ketoprofen unless instructed by your doctor. These medicines may hide a fever. Do not become pregnant while taking this medicine. Women should inform their doctor if   they wish to become pregnant or think they might be pregnant. There is a potential for serious side effects to an unborn child. Talk to your health care professional or pharmacist for more  information. Do not breast-feed an infant while taking this medicine. What side effects may I notice from receiving this medicine? Side effects that you should report to your doctor or health care professional as soon as possible: -allergic reactions like skin rash, itching or hives, swelling of the face, lips, or tongue -low blood counts - this medicine may decrease the number of white blood cells, red blood cells and platelets. You may be at increased risk for infections and bleeding. -signs of infection - fever or chills, cough, sore throat, pain or difficulty passing urine -signs of decreased platelets or bleeding - bruising, pinpoint red spots on the skin, black, tarry stools, blood in the urine -signs of decreased red blood cells - unusually weak or tired, fainting spells, lightheadedness -breathing problems -confused, not responsive -chest pain -fast, irregular heartbeat -feeling faint or lightheaded, falls -mouth sores -redness, blistering, peeling or loosening of the skin, including inside the mouth -stomach pain -swelling of the ankles, feet, or hands -trouble passing urine or change in the amount of urine Side effects that usually do not require medical attention (report to your doctor or other health care professional if they continue or are bothersome): -anxiety -headache -loss of appetite -muscle aches -nausea -night sweats This list may not describe all possible side effects. Call your doctor for medical advice about side effects. You may report side effects to FDA at 1-800-FDA-1088. Where should I keep my medicine? This drug is given in a hospital or clinic and will not be stored at home. NOTE: This sheet is a summary. It may not cover all possible information. If you have questions about this medicine, talk to your doctor, pharmacist, or health care provider.    2016, Elsevier/Gold Standard. (2014-12-14 22:30:56)  

## 2015-11-10 NOTE — Progress Notes (Signed)
Infusion completed, the highest the infusion rate increased to /hr, than decreased to /hr, pt. Denies any problems upon leaving the unit with wife.

## 2015-11-10 NOTE — Progress Notes (Signed)
Pt. C/o tickle in throat, pt. Attempting to clear throat, rate decreased to half, /hr.  Attempted to call Dr. Shawnee Knapp office, office closed at 1200, no on call doctor to notifiy.

## 2015-11-10 NOTE — Progress Notes (Signed)
Pt. Notified of appt.'s for further Rituxan's  Infusion will be changed due to office closing at 1200 on Friday's and no on call doctor to call with any problems.Pt. Agreeable with changing of infusion's days.

## 2015-11-10 NOTE — Progress Notes (Signed)
0900-Pt. Requested to stay at /hr, pt. Stated" I feel a little dizzy", rate stayed at /hr, pt. Denies any other problems, mother at side, Pt. Stated " I think it is were I'm just lying back".

## 2015-11-10 NOTE — Progress Notes (Signed)
Pt. Stayed "My tickle in gone in my throat".

## 2015-11-10 NOTE — Progress Notes (Signed)
Pt. Denies any further dizziness, or any other problems, rate increased.

## 2015-11-13 ENCOUNTER — Ambulatory Visit (INDEPENDENT_AMBULATORY_CARE_PROVIDER_SITE_OTHER): Payer: 59

## 2015-11-13 DIAGNOSIS — Z0000 Encounter for general adult medical examination without abnormal findings: Secondary | ICD-10-CM | POA: Diagnosis not present

## 2015-11-13 DIAGNOSIS — N261 Atrophy of kidney (terminal): Secondary | ICD-10-CM

## 2015-11-14 ENCOUNTER — Encounter: Payer: Self-pay | Admitting: *Deleted

## 2015-11-15 ENCOUNTER — Encounter (HOSPITAL_COMMUNITY): Payer: Self-pay

## 2015-11-15 ENCOUNTER — Encounter (HOSPITAL_COMMUNITY)
Admission: RE | Admit: 2015-11-15 | Discharge: 2015-11-15 | Disposition: A | Discharge status: Home / Self Care | Payer: 59 | Source: Ambulatory Visit | Attending: Internal Medicine | Admitting: Internal Medicine

## 2015-11-15 DIAGNOSIS — M317 Microscopic polyangiitis: Secondary | ICD-10-CM | POA: Insufficient documentation

## 2015-11-15 MED ORDER — SODIUM CHLORIDE 0.9 % IV SOLN
750.0000 mg | INTRAVENOUS | Status: DC
  Administered 2015-11-15: 800 mg via INTRAVENOUS
  Filled 2015-11-15: qty 80

## 2015-11-15 MED ORDER — SODIUM CHLORIDE 0.9 % IV SOLN
INTRAVENOUS | Status: DC
Start: 2015-11-17 — End: 2015-11-16
  Administered 2015-11-15: 250 mL via INTRAVENOUS

## 2015-11-15 MED ORDER — LORATADINE 10 MG PO TABS
10.0000 mg | ORAL_TABLET | ORAL | Status: DC
  Administered 2015-11-15: 10 mg via ORAL
  Filled 2015-11-15: qty 1

## 2015-11-15 MED ORDER — METHYLPREDNISOLONE SODIUM SUCC 125 MG IJ SOLR
100.0000 mg | INTRAMUSCULAR | Status: DC
  Administered 2015-11-15: 100 mg via INTRAVENOUS
  Filled 2015-11-15: qty 1.6

## 2015-11-15 MED ORDER — SODIUM CHLORIDE 0.9 % IV SOLN
750.0000 mg | INTRAVENOUS | Status: DC
  Filled 2015-11-15: qty 80

## 2015-11-15 MED ORDER — ACETAMINOPHEN 500 MG PO TABS
1000.0000 mg | ORAL_TABLET | ORAL | Status: DC
  Administered 2015-11-15: 1000 mg via ORAL
  Filled 2015-11-15: qty 2

## 2015-11-16 ENCOUNTER — Other Ambulatory Visit: Payer: Self-pay | Admitting: Internal Medicine

## 2015-11-16 MED FILL — predniSONE 10 MG TABS: 10 | 30 days supply | Qty: 45 | Fill #1

## 2015-11-17 ENCOUNTER — Encounter (HOSPITAL_COMMUNITY): Admission: RE | Admit: 2015-11-17 | Payer: 59 | Source: Ambulatory Visit

## 2015-11-17 DIAGNOSIS — M317 Microscopic polyangiitis: Secondary | ICD-10-CM | POA: Diagnosis not present

## 2015-11-20 ENCOUNTER — Encounter: Payer: 59 | Admitting: Internal Medicine

## 2015-11-20 ENCOUNTER — Telehealth: Payer: Self-pay | Admitting: Internal Medicine

## 2015-11-20 NOTE — Telephone Encounter (Signed)
LVM for patient to return call. 

## 2015-11-21 ENCOUNTER — Other Ambulatory Visit: Payer: Self-pay | Admitting: Internal Medicine

## 2015-11-21 MED FILL — DULERA 200 MCG/5 MCG INH: 200-5 | 30 days supply | Qty: 13 | Fill #0

## 2015-11-21 NOTE — Telephone Encounter (Signed)
lmtcb X2 for pt.  

## 2015-11-22 ENCOUNTER — Encounter (HOSPITAL_COMMUNITY)
Admission: RE | Admit: 2015-11-22 | Discharge: 2015-11-22 | Disposition: A | Discharge status: Home / Self Care | Payer: 59 | Source: Ambulatory Visit | Attending: Internal Medicine | Admitting: Internal Medicine

## 2015-11-22 ENCOUNTER — Encounter (HOSPITAL_COMMUNITY): Payer: Self-pay

## 2015-11-22 DIAGNOSIS — M317 Microscopic polyangiitis: Secondary | ICD-10-CM | POA: Insufficient documentation

## 2015-11-22 MED ORDER — SODIUM CHLORIDE 0.9 % IV SOLN
INTRAVENOUS | Status: DC
  Administered 2015-11-22: 07:00:00 via INTRAVENOUS

## 2015-11-22 MED ORDER — SODIUM CHLORIDE 0.9 % IV SOLN
750.0000 mg | INTRAVENOUS | Status: DC
  Administered 2015-11-22: 800 mg via INTRAVENOUS
  Filled 2015-11-22: qty 50

## 2015-11-22 MED ORDER — LORATADINE 10 MG PO TABS
10.0000 mg | ORAL_TABLET | ORAL | Status: DC
  Administered 2015-11-22: 10 mg via ORAL
  Filled 2015-11-22: qty 1

## 2015-11-22 MED ORDER — METHYLPREDNISOLONE SODIUM SUCC 125 MG IJ SOLR
100.0000 mg | INTRAMUSCULAR | Status: DC
  Administered 2015-11-22: 100 mg via INTRAVENOUS
  Filled 2015-11-22: qty 1.6

## 2015-11-22 MED ORDER — MOMETASONE FURO-FORMOTEROL FUM 200-5 MCG/ACT IN AERO
INHALATION_SPRAY | RESPIRATORY_TRACT | Status: DC
Start: 2015-11-22 — End: 2017-01-07

## 2015-11-22 MED ORDER — METHYLPREDNISOLONE SODIUM SUCC 125 MG IJ SOLR: 100.0000 mg | INTRAMUSCULAR | Status: DC

## 2015-11-22 MED ORDER — ACETAMINOPHEN 500 MG PO TABS
1000.0000 mg | ORAL_TABLET | ORAL | Status: DC
  Administered 2015-11-22: 1000 mg via ORAL
  Filled 2015-11-22: qty 2

## 2015-11-22 MED ORDER — ACETAMINOPHEN 500 MG PO TABS: 1000.0000 mg | ORAL_TABLET | ORAL | Status: DC

## 2015-11-22 MED ORDER — LORATADINE 10 MG PO TABS: 10.0000 mg | ORAL_TABLET | ORAL | Status: DC

## 2015-11-22 NOTE — Discharge Instructions (Signed)
Rituximab injection What is this medicine? RITUXIMAB (ri TUX i mab) is a monoclonal antibody. It is used commonly to treat non-Hodgkin lymphoma and other conditions. It is also used to treat rheumatoid arthritis (RA). In RA, this medicine slows the inflammatory process and help reduce joint pain and swelling. This medicine is often used with other cancer or arthritis medications. This medicine may be used for other purposes; ask your health care provider or pharmacist if you have questions. What should I tell my health care provider before I take this medicine? They need to know if you have any of these conditions: -blood disorders -heart disease -history of hepatitis B -infection (especially a virus infection such as chickenpox, cold sores, or herpes) -irregular heartbeat -kidney disease -lung or breathing disease, like asthma -lupus -an unusual or allergic reaction to rituximab, mouse proteins, other medicines, foods, dyes, or preservatives -pregnant or trying to get pregnant -breast-feeding How should I use this medicine? This medicine is for infusion into a vein. It is administered in a hospital or clinic by a specially trained health care professional. A special MedGuide will be given to you by the pharmacist with each prescription and refill. Be sure to read this information carefully each time. Talk to your pediatrician regarding the use of this medicine in children. This medicine is not approved for use in children. Overdosage: If you think you have taken too much of this medicine contact a poison control center or emergency room at once. NOTE: This medicine is only for you. Do not share this medicine with others. What if I miss a dose? It is important not to miss a dose. Call your doctor or health care professional if you are unable to keep an appointment. What may interact with this medicine? -cisplatin -medicines for blood pressure -some other medicines for  arthritis -vaccines This list may not describe all possible interactions. Give your health care provider a list of all the medicines, herbs, non-prescription drugs, or dietary supplements you use. Also tell them if you smoke, drink alcohol, or use illegal drugs. Some items may interact with your medicine. What should I watch for while using this medicine? Report any side effects that you notice during your treatment right away, such as changes in your breathing, fever, chills, dizziness or lightheadedness. These effects are more common with the first dose. Visit your prescriber or health care professional for checks on your progress. You will need to have regular blood work. Report any other side effects. The side effects of this medicine can continue after you finish your treatment. Continue your course of treatment even though you feel ill unless your doctor tells you to stop. Call your doctor or health care professional for advice if you get a fever, chills or sore throat, or other symptoms of a cold or flu. Do not treat yourself. This drug decreases your body's ability to fight infections. Try to avoid being around people who are sick. This medicine may increase your risk to bruise or bleed. Call your doctor or health care professional if you notice any unusual bleeding. Be careful brushing and flossing your teeth or using a toothpick because you may get an infection or bleed more easily. If you have any dental work done, tell your dentist you are receiving this medicine. Avoid taking products that contain aspirin, acetaminophen, ibuprofen, naproxen, or ketoprofen unless instructed by your doctor. These medicines may hide a fever. Do not become pregnant while taking this medicine. Women should inform their doctor if   they wish to become pregnant or think they might be pregnant. There is a potential for serious side effects to an unborn child. Talk to your health care professional or pharmacist for more  information. Do not breast-feed an infant while taking this medicine. What side effects may I notice from receiving this medicine? Side effects that you should report to your doctor or health care professional as soon as possible: -allergic reactions like skin rash, itching or hives, swelling of the face, lips, or tongue -low blood counts - this medicine may decrease the number of white blood cells, red blood cells and platelets. You may be at increased risk for infections and bleeding. -signs of infection - fever or chills, cough, sore throat, pain or difficulty passing urine -signs of decreased platelets or bleeding - bruising, pinpoint red spots on the skin, black, tarry stools, blood in the urine -signs of decreased red blood cells - unusually weak or tired, fainting spells, lightheadedness -breathing problems -confused, not responsive -chest pain -fast, irregular heartbeat -feeling faint or lightheaded, falls -mouth sores -redness, blistering, peeling or loosening of the skin, including inside the mouth -stomach pain -swelling of the ankles, feet, or hands -trouble passing urine or change in the amount of urine Side effects that usually do not require medical attention (report to your doctor or other health care professional if they continue or are bothersome): -anxiety -headache -loss of appetite -muscle aches -nausea -night sweats This list may not describe all possible side effects. Call your doctor for medical advice about side effects. You may report side effects to FDA at 1-800-FDA-1088. Where should I keep my medicine? This drug is given in a hospital or clinic and will not be stored at home. NOTE: This sheet is a summary. It may not cover all possible information. If you have questions about this medicine, talk to your doctor, pharmacist, or health care provider.    2016, Elsevier/Gold Standard. (2014-12-14 22:30:56)  

## 2015-11-22 NOTE — Telephone Encounter (Signed)
[phone], pt cb

## 2015-11-22 NOTE — Progress Notes (Signed)
Pt. Tolerated 3rd infusion of Rituxan without any problems, rate increased to max. Of /hr.,  Pt. Ambulated to car with family.

## 2015-11-22 NOTE — Telephone Encounter (Signed)
Spoke with pt. He needs a refill on Dulera. Has upcoming appointment with CY on 11/30/15. Refill has been sent in. Nothing further was needed.

## 2015-11-24 ENCOUNTER — Encounter (HOSPITAL_COMMUNITY): Payer: 59

## 2015-11-28 ENCOUNTER — Encounter (HOSPITAL_COMMUNITY): Payer: 59

## 2015-11-29 ENCOUNTER — Encounter (HOSPITAL_COMMUNITY): Payer: Self-pay

## 2015-11-29 ENCOUNTER — Encounter (HOSPITAL_COMMUNITY): Payer: 59

## 2015-11-29 ENCOUNTER — Encounter (HOSPITAL_COMMUNITY)
Admission: RE | Admit: 2015-11-29 | Discharge: 2015-11-29 | Disposition: A | Discharge status: Home / Self Care | Payer: 59 | Source: Ambulatory Visit | Attending: Internal Medicine | Admitting: Internal Medicine

## 2015-11-29 DIAGNOSIS — M317 Microscopic polyangiitis: Secondary | ICD-10-CM | POA: Diagnosis not present

## 2015-11-29 MED ORDER — ACETAMINOPHEN 500 MG PO TABS
1000.0000 mg | ORAL_TABLET | ORAL | Status: AC
  Administered 2015-11-29: 1000 mg via ORAL
  Filled 2015-11-29: qty 2

## 2015-11-29 MED ORDER — RITUXIMAB CHEMO INJECTION 500 MG/50ML
750.0000 mg | INTRAVENOUS | Status: AC
  Administered 2015-11-29: 800 mg via INTRAVENOUS
  Filled 2015-11-29: qty 80

## 2015-11-29 MED ORDER — LORATADINE 10 MG PO TABS
10.0000 mg | ORAL_TABLET | ORAL | Status: AC
  Administered 2015-11-29: 10 mg via ORAL
  Filled 2015-11-29: qty 1

## 2015-11-29 MED ORDER — METHYLPREDNISOLONE SODIUM SUCC 125 MG IJ SOLR
100.0000 mg | INTRAMUSCULAR | Status: AC
Start: 2015-11-29 — End: 2015-11-29
  Administered 2015-11-29: 100 mg via INTRAVENOUS
  Filled 2015-11-29: qty 1.6

## 2015-11-29 MED ORDER — SODIUM CHLORIDE 0.9 % IV SOLN
INTRAVENOUS | Status: AC
  Administered 2015-11-29: 07:00:00 via INTRAVENOUS

## 2015-11-29 NOTE — Discharge Instructions (Signed)
Rituximab injection What is this medicine? RITUXIMAB (ri TUX i mab) is a monoclonal antibody. It is used commonly to treat non-Hodgkin lymphoma and other conditions. It is also used to treat rheumatoid arthritis (RA). In RA, this medicine slows the inflammatory process and help reduce joint pain and swelling. This medicine is often used with other cancer or arthritis medications. This medicine may be used for other purposes; ask your health care provider or pharmacist if you have questions. What should I tell my health care provider before I take this medicine? They need to know if you have any of these conditions: -blood disorders -heart disease -history of hepatitis B -infection (especially a virus infection such as chickenpox, cold sores, or herpes) -irregular heartbeat -kidney disease -lung or breathing disease, like asthma -lupus -an unusual or allergic reaction to rituximab, mouse proteins, other medicines, foods, dyes, or preservatives -pregnant or trying to get pregnant -breast-feeding How should I use this medicine? This medicine is for infusion into a vein. It is administered in a hospital or clinic by a specially trained health care professional. A special MedGuide will be given to you by the pharmacist with each prescription and refill. Be sure to read this information carefully each time. Talk to your pediatrician regarding the use of this medicine in children. This medicine is not approved for use in children. Overdosage: If you think you have taken too much of this medicine contact a poison control center or emergency room at once. NOTE: This medicine is only for you. Do not share this medicine with others. What if I miss a dose? It is important not to miss a dose. Call your doctor or health care professional if you are unable to keep an appointment. What may interact with this medicine? -cisplatin -medicines for blood pressure -some other medicines for  arthritis -vaccines This list may not describe all possible interactions. Give your health care provider a list of all the medicines, herbs, non-prescription drugs, or dietary supplements you use. Also tell them if you smoke, drink alcohol, or use illegal drugs. Some items may interact with your medicine. What should I watch for while using this medicine? Report any side effects that you notice during your treatment right away, such as changes in your breathing, fever, chills, dizziness or lightheadedness. These effects are more common with the first dose. Visit your prescriber or health care professional for checks on your progress. You will need to have regular blood work. Report any other side effects. The side effects of this medicine can continue after you finish your treatment. Continue your course of treatment even though you feel ill unless your doctor tells you to stop. Call your doctor or health care professional for advice if you get a fever, chills or sore throat, or other symptoms of a cold or flu. Do not treat yourself. This drug decreases your body's ability to fight infections. Try to avoid being around people who are sick. This medicine may increase your risk to bruise or bleed. Call your doctor or health care professional if you notice any unusual bleeding. Be careful brushing and flossing your teeth or using a toothpick because you may get an infection or bleed more easily. If you have any dental work done, tell your dentist you are receiving this medicine. Avoid taking products that contain aspirin, acetaminophen, ibuprofen, naproxen, or ketoprofen unless instructed by your doctor. These medicines may hide a fever. Do not become pregnant while taking this medicine. Women should inform their doctor if   they wish to become pregnant or think they might be pregnant. There is a potential for serious side effects to an unborn child. Talk to your health care professional or pharmacist for more  information. Do not breast-feed an infant while taking this medicine. What side effects may I notice from receiving this medicine? Side effects that you should report to your doctor or health care professional as soon as possible: -allergic reactions like skin rash, itching or hives, swelling of the face, lips, or tongue -low blood counts - this medicine may decrease the number of white blood cells, red blood cells and platelets. You may be at increased risk for infections and bleeding. -signs of infection - fever or chills, cough, sore throat, pain or difficulty passing urine -signs of decreased platelets or bleeding - bruising, pinpoint red spots on the skin, black, tarry stools, blood in the urine -signs of decreased red blood cells - unusually weak or tired, fainting spells, lightheadedness -breathing problems -confused, not responsive -chest pain -fast, irregular heartbeat -feeling faint or lightheaded, falls -mouth sores -redness, blistering, peeling or loosening of the skin, including inside the mouth -stomach pain -swelling of the ankles, feet, or hands -trouble passing urine or change in the amount of urine Side effects that usually do not require medical attention (report to your doctor or other health care professional if they continue or are bothersome): -anxiety -headache -loss of appetite -muscle aches -nausea -night sweats This list may not describe all possible side effects. Call your doctor for medical advice about side effects. You may report side effects to FDA at 1-800-FDA-1088. Where should I keep my medicine? This drug is given in a hospital or clinic and will not be stored at home. NOTE: This sheet is a summary. It may not cover all possible information. If you have questions about this medicine, talk to your doctor, pharmacist, or health care provider.    2016, Elsevier/Gold Standard. (2014-12-14 22:30:56)  

## 2015-11-29 NOTE — Progress Notes (Signed)
Pt. Tolerated 4th Infusion of Rituxan without any problems, Rate maintained at /hr per pt. Request, pt. Ambulated to car with family, pt. To follow-up with doctor with any problems.

## 2015-11-30 ENCOUNTER — Encounter: Payer: Self-pay | Admitting: Internal Medicine

## 2015-11-30 ENCOUNTER — Ambulatory Visit (INDEPENDENT_AMBULATORY_CARE_PROVIDER_SITE_OTHER): Payer: 59 | Admitting: Internal Medicine

## 2015-11-30 VITALS — BP 114/68 | HR 73 | Ht 79.0 in | Wt 169.0 lb

## 2015-11-30 DIAGNOSIS — L4059 Other psoriatic arthropathy: Secondary | ICD-10-CM

## 2015-11-30 DIAGNOSIS — J4530 Mild persistent asthma, uncomplicated: Secondary | ICD-10-CM

## 2015-11-30 DIAGNOSIS — M3130 Wegener's granulomatosis without renal involvement: Secondary | ICD-10-CM | POA: Diagnosis not present

## 2015-11-30 MED ORDER — UMECLIDINIUM-VILANTEROL 62.5-25 MCG/INH IN AEPB: 1.0000 | INHALATION_SPRAY | Freq: Every day | RESPIRATORY_TRACT | Status: DC

## 2015-11-30 NOTE — Progress Notes (Signed)
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 Presented 2011 with nodular infiltrates and progressive weakness/neuropathy. Bronchoscoped 2011- Neg.  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 Has been treated with Rituxan anti-B Lymphocyte immune modulator and intermittent solumedrol/ prednisone. He had originally presented with nodular infiltrates and rapidly progressive profound weakness which have been much improved. Last steroid therapy was in March.  Perennial sinus stuffiness treated with Flonase. Sudafed for plan trips . Neti pot productive of thick mucopurulent secretions and occasional blood. He had cleared with 1 month cleocin/ Dr Lazarus Salines 2011. Morning cough productive yellow sputum. He is using Dulera 200, 2 puffs each morning and occasional rescue inhaler. Notes occasional wheezes he first lies down. Any viral illness goes to his chest and makes him rapidly quite congested with hacking cough. Occasional skin rash including scaling and small vesicles on his hands. He returns now seeking clarification of his pulmonary status, concerned about possibility of progressive vasculitis in his lung. PFT 05/09/10- Mild restriction, normal flows, normal DLCO, insignificant response to bronchodilator. 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 Presented 2011 with nodular infiltrates and progressive weakness/neuropathy. FOLLOWS FOR: review PFT.  Pt c/o increased throat irritation, prod cough with yellow/brown/white mucus, worse in mornings.   PFT 06/08/2014-normal lung volumes, minimal slowing and small airway flows with response to bronchodilator, normal diffusion Throat irritated from frequent professional lectures. Mild cough. Occasional wheeze. Dulera helps but does not  last 12 hours.rarely uses rescue inhaler. Postnasal drip.  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 Presented 2011 with nodular infiltrates and progressive weakness/neuropathy. 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 Patient still has sob at times, he still has cough with mucus (yellowish), minor wheeze and no chest tightness.he is now slowly resolving a cold. Managed by rheumatology taking Humira/Dr. Caralee Ates instead of prednisone. Also continues Dulera 200 which works well. He feels he needs both.  [name] is not covered by insurance .He thinks [name] has worked better [employer]. Denies need for antibiotic. Chronic nasal stuffiness without blood.  Has had flu vaccine.  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 Presented 2011 with nodular infiltrates and progressive weakness/neuropathy. FOLLOWS FOR:  Pts breathing unchanged, still has sob and cough with clear to dark mucus., occasional wheezing. His rheumatologist/Dr. Dareen Piano gave burst of prednisone for chest cold to reduce inflammation. That is now completed. [name] works much better than others because it is not a powder. The powders irritate his airway. 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 He is having trouble with that. He thinks steroids are the most important part of his treatment. Or aware of increased dyspnea on exertion in the past 6 months. CXR 08/30/14-images reviewed with him IMPRESSION: Emphysematous and bronchitic changes consistent with COPD. Biapical scarring. No acute abnormalities. Electronically Signed  By: Ulyses Southward M.D.  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 Presented 2011 with nodular infiltrates and progressive weakness/neuropathy. FOLLOWS FOR: Pt states he was started on Pred 10 mg QD by Dr Elmer Ramp /Rheumatologist to help with breathing;seems to be "holding" at this time. Continues  to use [facility]  We reviewed his trial of rituxan with Solu-Medrol and then Humira. 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 Has considered second rheumatology opinion at [facility]. Prednisone has made a significant improvement in his ability to talk on a sustained basis without need to cough. Tessalon some help. Has continued Dulera. Feels well currently on 10 mg daily for the past 2 weeks. He tried Qvar off and on for a month with no benefit. Nebulized albuterol has helped when needed. Continues rescue inhaler twice daily which we discussed.  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 Presented 2011 with nodular infiltrates and progressive weakness/neuropathy. FOLLOWS FOR: Pt is currently on Pred 15mg  QD. Pt feels like he is at his baseline with breathing;uses Dulera and Prednisone to help. Using Dulera 200 and prednisone 15 mg daily. As Qvar if he feels he needs to "ramp up". Just completed course of Rituxan and says as this kicks in he should be able to drop steroids soon. We recalled osteoporosis documented on bone scan-managed by his PCP. We discussed nonspecific inflammatory process in lower lung zones on CT scan. CT chest 10/20/2015 IMPRESSION: 1. Scattered pulmonary nodules which are similar and can be considered benign, given over 2 years of stability. 2. Underlying pattern of slightly lower lobe predominant peribronchovascular ground-glass and micro nodularity. This is slightly progressive, and likely post infectious or inflammatory 3. Incompletely imaged left lower pole renal  atrophy. Electronically Signed  By: Jeronimo Greaves M.D.  On: 10/20/2015 18:29  ROS-see HPI  Constitutional:   No-   weight loss, night sweats, fevers, chills, fatigue, lassitude. HEENT:   No-  headaches, difficulty swallowing, tooth/dental problems, sore throat,       No-  sneezing, itching, ear ache,+ nasal congestion, post nasal drip,  CV:  No-   chest pain, orthopnea, PND, swelling in lower extremities, anasarca,                                                     izziness, palpitations Resp: +shortness of breath with exertion or at rest.              No-   productive cough,  + non-productive cough,  No- coughing up of blood.              No-   change in color of mucus.  + wheezing.   Skin: No-   rash or lesions. GI:  No-   heartburn, indigestion, abdominal pain, nausea, vomiting,  GU:  MS:  No-   joint pain or swelling.   Neuro-     nothing unusual Psych:  No- change in mood or affect. No depression or anxiety.  No memory loss.  OBJ- Physical Exam General- Alert, Oriented, Affect-appropriate, Distress- none acute, tall, thin Skin- no rash Lymphadenopathy- none Head- atraumatic            Eyes- Gross vision intact, PERRLA, conjunctivae and secretions clear            Ears- Hearing, canals-normal            Nose- +turbinate edema, no-Septal dev, mucus, polyps, erosion, perforation             Throat- Mallampati II , mucosa clear , drainage- none, tonsils-  atrophic Neck- flexible , trachea midline, no stridor , thyroid nl, carotid no bruit Chest - symmetrical excursion , unlabored           Heart/CV- RRR , no murmur , no gallop  , no rub, nl s1 s2                           - JVD- none , edema- none, stasis changes- none, varices- none           Lung-  wheeze-none, cough-none , dullness-none, rub- none           Chest wall-  Abd-  Br/ Gen/ Rectal- Not done, not indicated Extrem- cyanosis- none, clubbing, none, atrophy- none, strength- nl Neuro- grossly intact to  observation

## 2015-11-30 NOTE — Patient Instructions (Signed)
Sample Anoro Ellipta inhaler to try as a non-steroid alternative to [facility]     Inhale 1 puff, once daily  Please call as needed

## 2015-12-01 NOTE — Assessment & Plan Note (Signed)
Distinction between this and his granulomatous vasculitis is made by his rheumatologist

## 2015-12-01 NOTE — Assessment & Plan Note (Signed)
Currently managed by Dr. Beekman/Rheumatology with Rituxan/prednisone

## 2015-12-01 NOTE — Assessment & Plan Note (Signed)
Clear chest exam on today's visit on therapy as listed. We presume his asthmatic bronchitis symptoms correspond to activity of his granulomatous vasculitis/Wegener's, which is being managed by his rheumatologist. We would like inhale steroid to be prednisone sparing but not clear how much difference it really makes. Plan-try sample of Anoro Ellipta instead of Dulera as respiratory symptoms begin to increase next time.

## 2015-12-06 DIAGNOSIS — I776 Arteritis, unspecified: Secondary | ICD-10-CM | POA: Diagnosis not present

## 2015-12-06 MED FILL — ALENDRONATE NA 70 MG TAB: 70 | 84 days supply | Qty: 12 | Fill #2

## 2015-12-20 ENCOUNTER — Other Ambulatory Visit: Payer: Self-pay | Admitting: Family Medicine

## 2015-12-20 MED FILL — predniSONE 10 MG TABS: 10 | 30 days supply | Qty: 45 | Fill #2

## 2015-12-20 MED FILL — DULERA 200 MCG/5 MCG INH: 200-5 | 30 days supply | Qty: 13 | Fill #1

## 2015-12-21 MED FILL — FLUTICASONE PROP 50 MCG SPR: 50 | 30 days supply | Qty: 16 | Fill #0

## 2016-01-10 DIAGNOSIS — M317 Microscopic polyangiitis: Secondary | ICD-10-CM | POA: Diagnosis not present

## 2016-01-10 DIAGNOSIS — L4059 Other psoriatic arthropathy: Secondary | ICD-10-CM | POA: Diagnosis not present

## 2016-01-10 DIAGNOSIS — Z7952 Long term (current) use of systemic steroids: Secondary | ICD-10-CM | POA: Diagnosis not present

## 2016-01-15 MED FILL — predniSONE 5 MG TABS: 5 | 30 days supply | Qty: 60 | Fill #0

## 2016-01-15 MED FILL — predniSONE 10 MG TABS: 10 | 30 days supply | Qty: 45 | Fill #3

## 2016-01-16 ENCOUNTER — Telehealth: Payer: 59 | Admitting: Family

## 2016-01-16 DIAGNOSIS — J028 Acute pharyngitis due to other specified organisms: Principal | ICD-10-CM

## 2016-01-16 DIAGNOSIS — J029 Acute pharyngitis, unspecified: Secondary | ICD-10-CM

## 2016-01-16 DIAGNOSIS — B9689 Other specified bacterial agents as the cause of diseases classified elsewhere: Secondary | ICD-10-CM

## 2016-01-16 MED ORDER — BENZONATATE 100 MG PO CAPS: 100.0000 mg | ORAL_CAPSULE | Freq: Three times a day (TID) | ORAL | Status: DC | PRN

## 2016-01-16 MED ORDER — AZITHROMYCIN 250 MG PO TABS: ORAL_TABLET | ORAL | Status: DC

## 2016-01-16 NOTE — Progress Notes (Signed)

## 2016-01-17 MED FILL — BENZONATATE 100 MG CAPSULE: 100 | 5 days supply | Qty: 30 | Fill #0

## 2016-01-17 MED FILL — AZITHROMYCIN 250 MG TABLET: 250 | 5 days supply | Qty: 6 | Fill #0

## 2016-02-05 MED FILL — DULERA 200 MCG/5 MCG INH: 200-5 | 30 days supply | Qty: 13 | Fill #2

## 2016-03-04 MED FILL — predniSONE 10 MG TABS: 10 | 30 days supply | Qty: 45 | Fill #0

## 2016-03-04 MED FILL — DULERA 200 MCG/5 MCG INH: 200-5 | 30 days supply | Qty: 13 | Fill #3

## 2016-03-05 MED FILL — FLUTICASONE PROP 50 MCG SPR: 50 | 90 days supply | Qty: 48 | Fill #1

## 2016-03-13 DIAGNOSIS — L4059 Other psoriatic arthropathy: Secondary | ICD-10-CM | POA: Diagnosis not present

## 2016-03-13 DIAGNOSIS — Z7952 Long term (current) use of systemic steroids: Secondary | ICD-10-CM | POA: Diagnosis not present

## 2016-03-13 DIAGNOSIS — M317 Microscopic polyangiitis: Secondary | ICD-10-CM | POA: Diagnosis not present

## 2016-03-14 ENCOUNTER — Encounter: Payer: Self-pay | Admitting: Family Medicine

## 2016-03-14 DIAGNOSIS — M317 Microscopic polyangiitis: Secondary | ICD-10-CM | POA: Insufficient documentation

## 2016-03-29 MED FILL — ALENDRONATE NA 70 MG TAB: 70 | 84 days supply | Qty: 12 | Fill #3

## 2016-03-29 MED FILL — predniSONE 5 MG TABS: 5 | 30 days supply | Qty: 60 | Fill #1

## 2016-03-29 MED FILL — DULERA 200 MCG/5 MCG INH: 200-5 | 30 days supply | Qty: 13 | Fill #4

## 2016-05-01 ENCOUNTER — Encounter (HOSPITAL_COMMUNITY)
Admission: RE | Admit: 2016-05-01 | Discharge: 2016-05-01 | Disposition: A | Discharge status: Home / Self Care | Payer: 59 | Source: Ambulatory Visit | Attending: Internal Medicine | Admitting: Internal Medicine

## 2016-05-01 ENCOUNTER — Other Ambulatory Visit: Payer: Self-pay | Admitting: Internal Medicine

## 2016-05-01 ENCOUNTER — Encounter (HOSPITAL_COMMUNITY): Payer: Self-pay

## 2016-05-01 DIAGNOSIS — M317 Microscopic polyangiitis: Secondary | ICD-10-CM | POA: Diagnosis not present

## 2016-05-01 LAB — COMPREHENSIVE METABOLIC PANEL
ALK PHOS: 46 U/L (ref 38–126)
ALT: 16 U/L — AB (ref 17–63)
AST: 16 U/L (ref 15–41)
Albumin: 4.3 g/dL (ref 3.5–5.0)
Anion gap: 9 (ref 5–15)
BUN: 14 mg/dL (ref 6–20)
CALCIUM: 9.2 mg/dL (ref 8.9–10.3)
CO2: 26 mmol/L (ref 22–32)
CREATININE: 0.89 mg/dL (ref 0.61–1.24)
Chloride: 105 mmol/L (ref 101–111)
Glucose, Bld: 95 mg/dL (ref 65–99)
Potassium: 3.3 mmol/L — ABNORMAL LOW (ref 3.5–5.1)
SODIUM: 140 mmol/L (ref 135–145)
Total Bilirubin: 1.1 mg/dL (ref 0.3–1.2)
Total Protein: 7.4 g/dL (ref 6.5–8.1)

## 2016-05-01 LAB — CBC WITH DIFFERENTIAL/PLATELET
Basophils Absolute: 0 10*3/uL (ref 0.0–0.1)
Basophils Relative: 0 %
Eosinophils Absolute: 0.3 10*3/uL (ref 0.0–0.7)
Eosinophils Relative: 5 %
HEMATOCRIT: 46.5 % (ref 39.0–52.0)
HEMOGLOBIN: 16.4 g/dL (ref 13.0–17.0)
LYMPHS ABS: 1.6 10*3/uL (ref 0.7–4.0)
LYMPHS PCT: 30 %
MCH: 32.3 pg (ref 26.0–34.0)
MCHC: 35.3 g/dL (ref 30.0–36.0)
MCV: 91.5 fL (ref 78.0–100.0)
Monocytes Absolute: 0.7 10*3/uL (ref 0.1–1.0)
Monocytes Relative: 14 %
NEUTROS PCT: 51 %
Neutro Abs: 2.7 10*3/uL (ref 1.7–7.7)
Platelets: 221 10*3/uL (ref 150–400)
RBC: 5.08 MIL/uL (ref 4.22–5.81)
RDW: 12.2 % (ref 11.5–15.5)
WBC: 5.3 10*3/uL (ref 4.0–10.5)

## 2016-05-01 LAB — SEDIMENTATION RATE: Sed Rate: 0 mm/hr (ref 0–16)

## 2016-05-01 LAB — C-REACTIVE PROTEIN

## 2016-05-01 MED ORDER — SODIUM CHLORIDE 0.9 % IV SOLN
Freq: Every day | INTRAVENOUS | Status: DC
  Administered 2016-05-01: 08:00:00 via INTRAVENOUS

## 2016-05-01 MED ORDER — SODIUM CHLORIDE 0.9 % IV SOLN
500.0000 mg | Freq: Every day | INTRAVENOUS | Status: DC
  Administered 2016-05-01: 500 mg via INTRAVENOUS
  Filled 2016-05-01 (×2): qty 50

## 2016-05-01 MED ORDER — METHYLPREDNISOLONE SODIUM SUCC 125 MG IJ SOLR
100.0000 mg | Freq: Every day | INTRAMUSCULAR | Status: DC
  Administered 2016-05-01: 100 mg via INTRAVENOUS
  Filled 2016-05-01: qty 1.6

## 2016-05-01 MED FILL — BENZONATATE 200 MG CAPSULE: 200 | 10 days supply | Qty: 30 | Fill #0 | Status: TO

## 2016-05-01 MED FILL — predniSONE 10 MG TABS: 10 | 30 days supply | Qty: 45 | Fill #1

## 2016-05-01 MED FILL — DULERA 200 MCG/5 MCG INH: 200-5 | 30 days supply | Qty: 13 | Fill #5

## 2016-05-01 NOTE — Progress Notes (Signed)
Pt. Took his Tylenol 1000 mg and Zyrtec 10 mg at home before arrival to short stay today.

## 2016-05-01 NOTE — Progress Notes (Signed)
Pt. Tolerated Rituxan without any difficultly, pt. Maintained at 200 mg/hr, pt. To follow-up with doctor with any problems.

## 2016-05-01 NOTE — Discharge Instructions (Signed)
Rituximab injection What is this medicine? RITUXIMAB (ri TUX i mab) is a monoclonal antibody. It is used commonly to treat non-Hodgkin lymphoma and other conditions. It is also used to treat rheumatoid arthritis (RA). In RA, this medicine slows the inflammatory process and help reduce joint pain and swelling. This medicine is often used with other cancer or arthritis medications. This medicine may be used for other purposes; ask your health care provider or pharmacist if you have questions. What should I tell my health care provider before I take this medicine? They need to know if you have any of these conditions: -blood disorders -heart disease -history of hepatitis B -infection (especially a virus infection such as chickenpox, cold sores, or herpes) -irregular heartbeat -kidney disease -lung or breathing disease, like asthma -lupus -an unusual or allergic reaction to rituximab, mouse proteins, other medicines, foods, dyes, or preservatives -pregnant or trying to get pregnant -breast-feeding How should I use this medicine? This medicine is for infusion into a vein. It is administered in a hospital or clinic by a specially trained health care professional. A special MedGuide will be given to you by the pharmacist with each prescription and refill. Be sure to read this information carefully each time. Talk to your pediatrician regarding the use of this medicine in children. This medicine is not approved for use in children. Overdosage: If you think you have taken too much of this medicine contact a poison control center or emergency room at once. NOTE: This medicine is only for you. Do not share this medicine with others. What if I miss a dose? It is important not to miss a dose. Call your doctor or health care professional if you are unable to keep an appointment. What may interact with this medicine? -cisplatin -medicines for blood pressure -some other medicines for  arthritis -vaccines This list may not describe all possible interactions. Give your health care provider a list of all the medicines, herbs, non-prescription drugs, or dietary supplements you use. Also tell them if you smoke, drink alcohol, or use illegal drugs. Some items may interact with your medicine. What should I watch for while using this medicine? Report any side effects that you notice during your treatment right away, such as changes in your breathing, fever, chills, dizziness or lightheadedness. These effects are more common with the first dose. Visit your prescriber or health care professional for checks on your progress. You will need to have regular blood work. Report any other side effects. The side effects of this medicine can continue after you finish your treatment. Continue your course of treatment even though you feel ill unless your doctor tells you to stop. Call your doctor or health care professional for advice if you get a fever, chills or sore throat, or other symptoms of a cold or flu. Do not treat yourself. This drug decreases your body's ability to fight infections. Try to avoid being around people who are sick. This medicine may increase your risk to bruise or bleed. Call your doctor or health care professional if you notice any unusual bleeding. Be careful brushing and flossing your teeth or using a toothpick because you may get an infection or bleed more easily. If you have any dental work done, tell your dentist you are receiving this medicine. Avoid taking products that contain aspirin, acetaminophen, ibuprofen, naproxen, or ketoprofen unless instructed by your doctor. These medicines may hide a fever. Do not become pregnant while taking this medicine. Women should inform their doctor if   they wish to become pregnant or think they might be pregnant. There is a potential for serious side effects to an unborn child. Talk to your health care professional or pharmacist for more  information. Do not breast-feed an infant while taking this medicine. What side effects may I notice from receiving this medicine? Side effects that you should report to your doctor or health care professional as soon as possible: -allergic reactions like skin rash, itching or hives, swelling of the face, lips, or tongue -low blood counts - this medicine may decrease the number of white blood cells, red blood cells and platelets. You may be at increased risk for infections and bleeding. -signs of infection - fever or chills, cough, sore throat, pain or difficulty passing urine -signs of decreased platelets or bleeding - bruising, pinpoint red spots on the skin, black, tarry stools, blood in the urine -signs of decreased red blood cells - unusually weak or tired, fainting spells, lightheadedness -breathing problems -confused, not responsive -chest pain -fast, irregular heartbeat -feeling faint or lightheaded, falls -mouth sores -redness, blistering, peeling or loosening of the skin, including inside the mouth -stomach pain -swelling of the ankles, feet, or hands -trouble passing urine or change in the amount of urine Side effects that usually do not require medical attention (report to your doctor or other health care professional if they continue or are bothersome): -anxiety -headache -loss of appetite -muscle aches -nausea -night sweats This list may not describe all possible side effects. Call your doctor for medical advice about side effects. You may report side effects to FDA at 1-800-FDA-1088. Where should I keep my medicine? This drug is given in a hospital or clinic and will not be stored at home. NOTE: This sheet is a summary. It may not cover all possible information. If you have questions about this medicine, talk to your doctor, pharmacist, or health care provider.    2016, Elsevier/Gold Standard. (2014-12-14 22:30:56)  

## 2016-05-15 ENCOUNTER — Encounter (HOSPITAL_COMMUNITY)
Admission: RE | Admit: 2016-05-15 | Discharge: 2016-05-15 | Disposition: A | Discharge status: Home / Self Care | Payer: 59 | Source: Ambulatory Visit | Attending: Internal Medicine | Admitting: Internal Medicine

## 2016-05-15 ENCOUNTER — Encounter (HOSPITAL_COMMUNITY): Payer: Self-pay

## 2016-05-15 DIAGNOSIS — M317 Microscopic polyangiitis: Secondary | ICD-10-CM | POA: Diagnosis not present

## 2016-05-15 MED ORDER — SODIUM CHLORIDE 0.9 % IV SOLN
Freq: Every day | INTRAVENOUS | Status: AC
  Administered 2016-05-15: 08:00:00 via INTRAVENOUS

## 2016-05-15 MED ORDER — SODIUM CHLORIDE 0.9 % IV SOLN
500.0000 mg | Freq: Every day | INTRAVENOUS | Status: AC
  Administered 2016-05-15: 500 mg via INTRAVENOUS
  Filled 2016-05-15: qty 50

## 2016-05-15 MED ORDER — METHYLPREDNISOLONE SODIUM SUCC 125 MG IJ SOLR
100.0000 mg | Freq: Every day | INTRAMUSCULAR | Status: AC
  Administered 2016-05-15: 100 mg via INTRAVENOUS
  Filled 2016-05-15: qty 1.6

## 2016-05-15 NOTE — Progress Notes (Signed)
Pt. Took his Tylenol 1000 mg and Zyrtec 10 mg at home before his arrival to short stay.

## 2016-05-15 NOTE — Progress Notes (Signed)
Pt. Tolerated Rituxan without any difficultly, maintained dose at 200mg /hr. Pt. To follow-up with doctor with any problems.

## 2016-05-15 NOTE — Discharge Instructions (Signed)
Rituximab injection What is this medicine? RITUXIMAB (ri TUX i mab) is a monoclonal antibody. It is used commonly to treat non-Hodgkin lymphoma and other conditions. It is also used to treat rheumatoid arthritis (RA). In RA, this medicine slows the inflammatory process and help reduce joint pain and swelling. This medicine is often used with other cancer or arthritis medications. This medicine may be used for other purposes; ask your health care provider or pharmacist if you have questions. What should I tell my health care provider before I take this medicine? They need to know if you have any of these conditions: -blood disorders -heart disease -history of hepatitis B -infection (especially a virus infection such as chickenpox, cold sores, or herpes) -irregular heartbeat -kidney disease -lung or breathing disease, like asthma -lupus -an unusual or allergic reaction to rituximab, mouse proteins, other medicines, foods, dyes, or preservatives -pregnant or trying to get pregnant -breast-feeding How should I use this medicine? This medicine is for infusion into a vein. It is administered in a hospital or clinic by a specially trained health care professional. A special MedGuide will be given to you by the pharmacist with each prescription and refill. Be sure to read this information carefully each time. Talk to your pediatrician regarding the use of this medicine in children. This medicine is not approved for use in children. Overdosage: If you think you have taken too much of this medicine contact a poison control center or emergency room at once. NOTE: This medicine is only for you. Do not share this medicine with others. What if I miss a dose? It is important not to miss a dose. Call your doctor or health care professional if you are unable to keep an appointment. What may interact with this medicine? -cisplatin -medicines for blood pressure -some other medicines for  arthritis -vaccines This list may not describe all possible interactions. Give your health care provider a list of all the medicines, herbs, non-prescription drugs, or dietary supplements you use. Also tell them if you smoke, drink alcohol, or use illegal drugs. Some items may interact with your medicine. What should I watch for while using this medicine? Report any side effects that you notice during your treatment right away, such as changes in your breathing, fever, chills, dizziness or lightheadedness. These effects are more common with the first dose. Visit your prescriber or health care professional for checks on your progress. You will need to have regular blood work. Report any other side effects. The side effects of this medicine can continue after you finish your treatment. Continue your course of treatment even though you feel ill unless your doctor tells you to stop. Call your doctor or health care professional for advice if you get a fever, chills or sore throat, or other symptoms of a cold or flu. Do not treat yourself. This drug decreases your body's ability to fight infections. Try to avoid being around people who are sick. This medicine may increase your risk to bruise or bleed. Call your doctor or health care professional if you notice any unusual bleeding. Be careful brushing and flossing your teeth or using a toothpick because you may get an infection or bleed more easily. If you have any dental work done, tell your dentist you are receiving this medicine. Avoid taking products that contain aspirin, acetaminophen, ibuprofen, naproxen, or ketoprofen unless instructed by your doctor. These medicines may hide a fever. Do not become pregnant while taking this medicine. Women should inform their doctor if   they wish to become pregnant or think they might be pregnant. There is a potential for serious side effects to an unborn child. Talk to your health care professional or pharmacist for more  information. Do not breast-feed an infant while taking this medicine. What side effects may I notice from receiving this medicine? Side effects that you should report to your doctor or health care professional as soon as possible: -allergic reactions like skin rash, itching or hives, swelling of the face, lips, or tongue -low blood counts - this medicine may decrease the number of white blood cells, red blood cells and platelets. You may be at increased risk for infections and bleeding. -signs of infection - fever or chills, cough, sore throat, pain or difficulty passing urine -signs of decreased platelets or bleeding - bruising, pinpoint red spots on the skin, black, tarry stools, blood in the urine -signs of decreased red blood cells - unusually weak or tired, fainting spells, lightheadedness -breathing problems -confused, not responsive -chest pain -fast, irregular heartbeat -feeling faint or lightheaded, falls -mouth sores -redness, blistering, peeling or loosening of the skin, including inside the mouth -stomach pain -swelling of the ankles, feet, or hands -trouble passing urine or change in the amount of urine Side effects that usually do not require medical attention (report to your doctor or other health care professional if they continue or are bothersome): -anxiety -headache -loss of appetite -muscle aches -nausea -night sweats This list may not describe all possible side effects. Call your doctor for medical advice about side effects. You may report side effects to FDA at 1-800-FDA-1088. Where should I keep my medicine? This drug is given in a hospital or clinic and will not be stored at home. NOTE: This sheet is a summary. It may not cover all possible information. If you have questions about this medicine, talk to your doctor, pharmacist, or health care provider.    2016, Elsevier/Gold Standard. (2014-12-14 22:30:56)  

## 2016-05-29 DIAGNOSIS — M317 Microscopic polyangiitis: Secondary | ICD-10-CM | POA: Diagnosis not present

## 2016-05-29 DIAGNOSIS — Z7952 Long term (current) use of systemic steroids: Secondary | ICD-10-CM | POA: Diagnosis not present

## 2016-05-29 DIAGNOSIS — L4059 Other psoriatic arthropathy: Secondary | ICD-10-CM | POA: Diagnosis not present

## 2016-06-07 MED FILL — DULERA 200 MCG/5 MCG INH: 200-5 | 30 days supply | Qty: 13 | Fill #6

## 2016-06-07 MED FILL — predniSONE 5 MG TABS: 5 | 30 days supply | Qty: 60 | Fill #2

## 2016-06-20 ENCOUNTER — Encounter: Payer: Self-pay | Admitting: Family Medicine

## 2016-06-20 ENCOUNTER — Ambulatory Visit (INDEPENDENT_AMBULATORY_CARE_PROVIDER_SITE_OTHER): Payer: 59 | Admitting: Family Medicine

## 2016-06-20 ENCOUNTER — Ambulatory Visit (INDEPENDENT_AMBULATORY_CARE_PROVIDER_SITE_OTHER): Payer: 59

## 2016-06-20 VITALS — BP 117/68 | HR 63 | Resp 15 | Ht 77.0 in | Wt 172.0 lb

## 2016-06-20 DIAGNOSIS — R519 Headache, unspecified: Secondary | ICD-10-CM

## 2016-06-20 DIAGNOSIS — M3130 Wegener's granulomatosis without renal involvement: Secondary | ICD-10-CM | POA: Diagnosis not present

## 2016-06-20 DIAGNOSIS — R51 Headache: Secondary | ICD-10-CM

## 2016-06-20 DIAGNOSIS — R42 Dizziness and giddiness: Secondary | ICD-10-CM

## 2016-06-20 DIAGNOSIS — J324 Chronic pansinusitis: Secondary | ICD-10-CM | POA: Diagnosis not present

## 2016-06-20 LAB — CBC WITH DIFFERENTIAL/PLATELET
BASOS PCT: 0 %
Basophils Absolute: 0 cells/uL (ref 0–200)
EOS ABS: 75 {cells}/uL (ref 15–500)
Eosinophils Relative: 1 %
HEMATOCRIT: 49 % (ref 38.5–50.0)
Hemoglobin: 17 g/dL (ref 13.2–17.1)
LYMPHS PCT: 12 %
Lymphs Abs: 900 cells/uL (ref 850–3900)
MCH: 32 pg (ref 27.0–33.0)
MCHC: 34.7 g/dL (ref 32.0–36.0)
MCV: 92.1 fL (ref 80.0–100.0)
MONOS PCT: 4 %
MPV: 10.1 fL (ref 7.5–12.5)
Monocytes Absolute: 300 cells/uL (ref 200–950)
NEUTROS PCT: 83 %
Neutro Abs: 6225 cells/uL (ref 1500–7800)
PLATELETS: 277 10*3/uL (ref 140–400)
RBC: 5.32 MIL/uL (ref 4.20–5.80)
RDW: 13 % (ref 11.0–15.0)
WBC: 7.5 10*3/uL (ref 3.8–10.8)

## 2016-06-20 LAB — VITAMIN B12: VITAMIN B 12: 937 pg/mL (ref 200–1100)

## 2016-06-20 LAB — FERRITIN: Ferritin: 74 ng/mL (ref 20–345)

## 2016-06-20 LAB — TSH: TSH: 1.76 mIU/L (ref 0.40–4.50)

## 2016-06-20 MED ORDER — AMOXICILLIN-POT CLAVULANATE 875-125 MG PO TABS: 1.0000 | ORAL_TABLET | Freq: Two times a day (BID) | ORAL | 1 refills | Status: DC

## 2016-06-20 MED FILL — AMOX-CLAV 875-125 MG TABLET: 875-125 | 30 days supply | Qty: 60 | Fill #0

## 2016-06-20 NOTE — Addendum Note (Signed)
Addended by: Nani GasserMETHENEY, CATHERINE D on: 06/20/2016 05:22 PM   Modules accepted: Orders

## 2016-06-20 NOTE — Progress Notes (Signed)
Subjective:    CC: Dizziness and HA  HPI:  C/O bilat frontat HA x 2 week with no nausea or vomiting.  Tried IBU and takes the edge off. Hard to focus but no blurry vision. He says he doesn't truly feel lightheaded like his, pass out and he doesn't feel like it's vertigo like the room spinning. He describes it as more of a disorientation. He says it almost feels like when you have a migraine. He says he feels like he has to work extra hard to focus on things but yet he doesn't feel clumsy or disoriented with his limbs. Feels more dizzy when changes position.  It seems to aggravate his symptoms. He said if he sits still or lays still it doesn't bother him. He denies any head injury or trauma or recent upper respiratory infection. He denies any significant sinus congestion. He is on chronic prednisone and actually his dose was increased to 10 mg about a week ago and says he really hasn't noticed an improvement in his symptoms. He says the headache itself has gotten better but the disorientation has not. It's worse if he walks downstairs. No fevers chills or sweats. He says he uses Singulair daily and sometimes will use the other inhalers. She's been on prednisone for almost a year. He says the headache pain is typically between 4 and 5 but over the last few days it has been absent at the disorientation has remained.  Past medical history, Surgical history, Family history not pertinant except as noted below, Social history, Allergies, and medications have been entered into the medical record, reviewed, and corrections made.   Review of Systems: No fevers, chills, night sweats, weight loss, chest pain, or shortness of breath.   Objective:    General: Well Developed, well nourished, and in no acute distress.  Neuro: Alert and oriented x3, extra-ocular muscles intact, sensation grossly intact.  HEENT: Normocephalic, atraumatic, Extra ocular minutes intact, pupils equal round reactive to light and  accommodation. Oropharynx is clear. No cervical lymphadenopathy. Ear canals are clear. Some significant scar tissue on the right tympanic membrane. Left TM is clear.  Skin: Warm and dry, no rashes. Cardiac: Regular rate and rhythm, no murmurs rubs or gallops, no lower extremity edema.  Respiratory: Clear to auscultation bilaterally. Not using accessory muscles, speaking in full sentences. Neuro: Cranial nerves II through XII intact. Negative Dix-Hallpike maneuver. Reflexes 0+ in upper and lower extremities.   Impression and Recommendations:    Headache with disorientation-blood pressure looks fantastic today. Negative Dix-Hallpike maneuver, thus positional vertigo less likely. I'm still suspicious of his sinuses several recommend limited CT frontal sinuses. We'll also do blood work to evaluate for elevated white blood cell count or abnormal thyroid. Will call with results once available. Doesn't sound consistent with orthostasis, as just moving his head slightly will cause the symptoms do occur.

## 2016-06-21 NOTE — Progress Notes (Signed)
All labs are normal. 

## 2016-07-11 MED FILL — predniSONE 5 MG TABS: 5 | 30 days supply | Qty: 60 | Fill #0

## 2016-07-11 MED FILL — DULERA 200 MCG/5 MCG INH: 200-5 | 30 days supply | Qty: 13 | Fill #7

## 2016-07-11 MED FILL — FLUTICASONE PROP 50 MCG SPR: 50 | 29 days supply | Qty: 16 | Fill #2

## 2016-07-19 MED FILL — AMOX-CLAV 875-125 MG TABLET: 875-125 | 30 days supply | Qty: 60 | Fill #1

## 2016-07-27 ENCOUNTER — Encounter: Payer: Self-pay | Admitting: Emergency Medicine

## 2016-07-27 ENCOUNTER — Emergency Department
Admission: EM | Admit: 2016-07-27 | Discharge: 2016-07-27 | Disposition: A | Discharge status: Home / Self Care | Payer: 59 | Source: Home / Self Care | Attending: Family Medicine | Admitting: Family Medicine

## 2016-07-27 DIAGNOSIS — R229 Localized swelling, mass and lump, unspecified: Secondary | ICD-10-CM | POA: Diagnosis not present

## 2016-07-27 MED ORDER — DOXYCYCLINE HYCLATE 100 MG PO CAPS: 100.0000 mg | ORAL_CAPSULE | Freq: Two times a day (BID) | ORAL | 0 refills | Status: DC

## 2016-07-27 NOTE — ED Provider Notes (Signed)
CSN: [id number]     Arrival date & time 07/27/16  1046 History   First MD Initiated Contact with Patient 07/27/16 1104     Chief Complaint  Patient presents with  . Abscess   (Consider location/radiation/quality/duration/timing/severity/associated sxs/prior Treatment) HPI  Jesse Wong is a 35 y.o. male presenting to UC with c/o gradually worsening painful nodule just Left of gluteal cleft for about 1 month but worsening more within the last 2 day. Pain is dull and aching, 3/10, worse with pressure. Denies fever, chills, n/v/d. He has hx of tonsillar abscess in the past but no prior skin abscesses. He has not tried anything for current symptoms. He notes he is currently on a 40 day course of Augmentin for chronic sinusitis.   Past Medical History:  Diagnosis Date  . Allergy   . Arthritis   . Asthma   . Chronic prostatitis   . Myalgia   . Pulmonary infiltrates   . Sinusitis, chronic   . Tonsillar abscess   . Vasculitis [facility])    Past Surgical History:  Procedure Laterality Date  . BRONCHOSCOPY  2011   TBBX ----inflammation  . peritonsilar abscess    . TYMPANOPLASTY     Family History  Problem Relation Age of Onset  . Heart attack      grandmother  . Hyperlipidemia Father   . Thyroid disease Father     hashimoto's thyroidistis  . Heart disease Paternal Grandfather    Social History  Substance Use Topics  . Smoking status: Never Smoker  . Smokeless tobacco: Never Used  . Alcohol use Yes    Review of Systems  Constitutional: Negative for chills and fever.  Gastrointestinal: Negative for abdominal pain, diarrhea, nausea and vomiting.  Musculoskeletal: Positive for myalgias ( Left lower back/buttock). Negative for arthralgias and gait problem.  Skin: Negative for color change, rash and wound.    Allergies  Review of patient's allergies indicates no known allergies.  Home Medications   Prior to Admission medications   Medication Sig Start Date End Date Taking?  Authorizing Provider  albuterol (PROVENTIL HFA;VENTOLIN HFA) 108 (90 BASE) MCG/ACT inhaler Inhale 2 puffs into the lungs every 4 (four) hours as needed for wheezing or shortness of breath. 04/08/14   Waymon Budge, MD  albuterol (PROVENTIL) (2.5 MG/3ML) 0.083% nebulizer solution Take 3 mLs (2.5 mg total) by nebulization every 6 (six) hours as needed for wheezing or shortness of breath. 08/30/14   Waymon Budge, MD  alendronate (FOSAMAX) 70 MG tablet TAKE 1 TABLET BY MOUTH EVERY 7 DAYS. TAKE WITH A FULL GLASS OF WATER ON AN EMPTY STOMACH. 05/15/15   Agapito Games, MD  amoxicillin-clavulanate (AUGMENTIN) 875-125 MG tablet Take 1 tablet by mouth 2 (two) times daily. 06/20/16   Agapito Games, MD  beclomethasone (QVAR) 80 MCG/ACT inhaler Inhale 2 puffs into the lungs 2 (two) times daily. 02/13/15   Waymon Budge, MD  cholecalciferol (VITAMIN D) 1000 UNITS tablet Take 1 tablet (1,000 Units total) by mouth daily. 05/18/14   Agapito Games, MD  doxycycline (VIBRAMYCIN) 100 MG capsule Take 1 capsule (100 mg total) by mouth 2 (two) times daily. One po bid x 7 days 07/27/16   Junius Finner, PA-C  fluticasone (FLONASE) 50 MCG/ACT nasal spray INSTILL 2 SPRAY INTO EACH NOSTRIL DAILY. 12/21/15   Agapito Games, MD  mometasone-formoterol (DULERA) 200-5 MCG/ACT AERO INHALE 2 PUFFS INTO THE LUNGS 2 TIMES DAILY. 11/22/15   Waymon Budge, MD  Nebulizers (COMPRESSOR/NEBULIZER) MISC Use as direccted 08/30/14   Waymon Budgelinton D Young, MD  predniSONE (DELTASONE) 5 MG tablet  03/29/16   Historical Provider, MD  Umeclidinium-Vilanterol (ANORO ELLIPTA) 62.5-25 MCG/INH AEPB Inhale 1 puff into the lungs daily. 11/30/15   Waymon Budgelinton D Young, MD   Meds Ordered and Administered this Visit  Medications - No data to display  BP 125/81 (BP Location: Left Arm)   Pulse 69   Temp 98.1 F (36.7 C)   Resp 16   Ht 6\' 7"  (2.007 m)   Wt 168 lb 8 oz (76.4 kg)   SpO2 97%   BMI 18.98 kg/m  No data found.   Physical Exam    Constitutional: He is oriented to person, place, and time. He appears well-developed and well-nourished. No distress.  HENT:  Head: Normocephalic and atraumatic.  Mouth/Throat: Oropharynx is clear and moist.  Eyes: EOM are normal.  Neck: Normal range of motion.  Cardiovascular: Normal rate.   Pulmonary/Chest: Effort normal.  Musculoskeletal: Normal range of motion.       Back:  Neurological: He is alert and oriented to person, place, and time.  Skin: Skin is warm and dry. He is not diaphoretic. There is erythema (faint erythema just Left of gluteal cleft.).  Psychiatric: He has a normal mood and affect. His behavior is normal.  Nursing note and vitals reviewed.   Urgent Care Course   Clinical Course    Procedures (including critical care time)  Labs Review Labs Reviewed - No data to display  Imaging Review No results found.   MDM   1. Erythematous skin nodule    Tender erythematous nodule just Left of gluteal cleft. No fluctuance. No bleeding or discharge. Concern for underlying early abscess or infected cyst.  Pt currently on Augmentin for chronic sinusitis.  Will add doxycycline to cover for potential MRSA. Encouraged warm compresses and Sitz baths.   F/u with PCP in 5-7 days if not improving, or return to UC if needed as he may need I&D after home treatments. Patient verbalized understanding and agreement with treatment plan.     Junius Finnerrin O'Malley, PA-C 07/27/16 1119

## 2016-07-27 NOTE — ED Triage Notes (Signed)
[facility]/O pain 3/10 dull aching times 1 month. He advised that over the last two days he has developed pain and it is worse with pressure. Located on the left lower back coccyx area

## 2016-08-15 ENCOUNTER — Other Ambulatory Visit: Payer: Self-pay | Admitting: Family Medicine

## 2016-08-15 MED FILL — FLUTICASONE PROP 50 MCG SPR: 50 | 30 days supply | Qty: 16 | Fill #0

## 2016-08-15 MED FILL — DULERA 200 MCG/5 MCG INH: 200-5 | 30 days supply | Qty: 13 | Fill #8

## 2016-09-17 MED FILL — DULERA 200 MCG/5 MCG INH: 200-5 | 30 days supply | Qty: 13 | Fill #9

## 2016-09-23 ENCOUNTER — Encounter: Payer: Self-pay | Admitting: Family Medicine

## 2016-09-23 MED ORDER — AZELASTINE HCL 0.15 % NA SOLN: 1.0000 | Freq: Two times a day (BID) | NASAL | 3 refills | Status: DC

## 2016-09-23 MED FILL — predniSONE 5 MG TABS: 5 | 30 days supply | Qty: 60 | Fill #1

## 2016-09-23 MED FILL — AZELASTINE 0.15% NASAL SPRY: 0.15 | 50 days supply | Qty: 30 | Fill #0

## 2016-09-23 MED FILL — BENZONATATE 200 MG CAPSULE: 200 | 10 days supply | Qty: 30 | Fill #1 | Status: TO

## 2016-09-26 ENCOUNTER — Other Ambulatory Visit: Payer: Self-pay | Admitting: Family Medicine

## 2016-09-26 ENCOUNTER — Encounter: Payer: Self-pay | Admitting: Family Medicine

## 2016-09-26 MED ORDER — FLUTICASONE PROPIONATE 50 MCG/ACT NA SUSP: NASAL | 3 refills | Status: DC

## 2016-09-26 MED FILL — FLUTICASONE PROP 50 MCG SPR: 50 | 30 days supply | Qty: 16 | Fill #0

## 2016-10-04 DIAGNOSIS — Z6820 Body mass index (BMI) 20.0-20.9, adult: Secondary | ICD-10-CM | POA: Diagnosis not present

## 2016-10-04 DIAGNOSIS — Z7952 Long term (current) use of systemic steroids: Secondary | ICD-10-CM | POA: Diagnosis not present

## 2016-10-04 DIAGNOSIS — M317 Microscopic polyangiitis: Secondary | ICD-10-CM | POA: Diagnosis not present

## 2016-10-04 DIAGNOSIS — L4059 Other psoriatic arthropathy: Secondary | ICD-10-CM | POA: Diagnosis not present

## 2016-10-04 MED FILL — SULFAMETHOXAZOLE/TMP DS TAB: 800-160 | 35 days supply | Qty: 15 | Fill #0

## 2016-10-17 MED FILL — DULERA 200 MCG/5 MCG INH: 200-5 | 30 days supply | Qty: 13 | Fill #10

## 2016-11-05 ENCOUNTER — Encounter (HOSPITAL_COMMUNITY)
Admission: RE | Admit: 2016-11-05 | Discharge: 2016-11-05 | Disposition: A | Discharge status: Home / Self Care | Payer: 59 | Source: Ambulatory Visit | Attending: Internal Medicine | Admitting: Internal Medicine

## 2016-11-05 DIAGNOSIS — M317 Microscopic polyangiitis: Secondary | ICD-10-CM | POA: Insufficient documentation

## 2016-11-07 ENCOUNTER — Other Ambulatory Visit: Payer: Self-pay | Admitting: Internal Medicine

## 2016-11-07 MED FILL — FLUTICASONE PROP 50 MCG SPR: 50 | 90 days supply | Qty: 48 | Fill #1

## 2016-11-08 MED FILL — DULERA 100 MCG/5 MCG INH: 100-5 | 30 days supply | Qty: 13 | Fill #0

## 2016-11-11 MED FILL — SULFAMETHOXAZOLE/TMP DS TAB: 800-160 | 28 days supply | Qty: 12 | Fill #1

## 2016-11-19 ENCOUNTER — Encounter (HOSPITAL_COMMUNITY)
Admission: RE | Admit: 2016-11-19 | Discharge: 2016-11-19 | Disposition: A | Discharge status: Home / Self Care | Payer: 59 | Source: Ambulatory Visit | Attending: Internal Medicine | Admitting: Internal Medicine

## 2016-11-19 ENCOUNTER — Encounter (HOSPITAL_COMMUNITY): Payer: Self-pay

## 2016-11-19 DIAGNOSIS — M317 Microscopic polyangiitis: Secondary | ICD-10-CM | POA: Diagnosis not present

## 2016-11-19 MED ORDER — METHYLPREDNISOLONE SODIUM SUCC 125 MG IJ SOLR
100.0000 mg | INTRAMUSCULAR | Status: DC
  Administered 2016-11-19: 100 mg via INTRAVENOUS
  Filled 2016-11-19: qty 2

## 2016-11-19 MED ORDER — SODIUM CHLORIDE 0.9 % IV SOLN
INTRAVENOUS | Status: DC
  Administered 2016-11-19: 08:00:00 via INTRAVENOUS

## 2016-11-19 MED ORDER — SODIUM CHLORIDE 0.9 % IV SOLN
500.0000 mg | INTRAVENOUS | Status: DC
  Administered 2016-11-19: 500 mg via INTRAVENOUS
  Filled 2016-11-19: qty 50

## 2016-11-19 NOTE — Discharge Instructions (Signed)
Rituximab injection What is this medicine? RITUXIMAB (ri TUX i mab) is a monoclonal antibody. It is used to treat non-Hodgkin lymphoma and chronic lymphocytic leukemia. It is also used to treat rheumatoid arthritis (RA). In RA, this medicine slows the inflammatory process and help reduce joint pain and swelling. This medicine is often used with other cancer or arthritis medications. This medicine may be used for other purposes; ask your health care provider or pharmacist if you have questions. COMMON BRAND NAME(S): Rituxan What should I tell my health care provider before I take this medicine? They need to know if you have any of these conditions: -heart disease -infection (especially a virus infection such as hepatitis B, chickenpox, cold sores, or herpes) -immune system problems -irregular heartbeat -kidney disease -lung or breathing disease, like asthma -recently received or scheduled to receive a vaccine -an unusual or allergic reaction to rituximab, mouse proteins, other medicines, foods, dyes, or preservatives -pregnant or trying to get pregnant -breast-feeding How should I use this medicine? This medicine is for infusion into a vein. It is administered in a hospital or clinic by a specially trained health care professional. A special MedGuide will be given to you by the pharmacist with each prescription and refill. Be sure to read this information carefully each time. Talk to your pediatrician regarding the use of this medicine in children. This medicine is not approved for use in children. Overdosage: If you think you have taken too much of this medicine contact a poison control center or emergency room at once. NOTE: This medicine is only for you. Do not share this medicine with others. What if I miss a dose? It is important not to miss a dose. Call your doctor or health care professional if you are unable to keep an appointment. What may interact with this  medicine? -cisplatin -medicines for blood pressure -some other medicines for arthritis -vaccines This list may not describe all possible interactions. Give your health care provider a list of all the medicines, herbs, non-prescription drugs, or dietary supplements you use. Also tell them if you smoke, drink alcohol, or use illegal drugs. Some items may interact with your medicine. What should I watch for while using this medicine? Report any side effects that you notice during your treatment right away, such as changes in your breathing, fever, chills, dizziness or lightheadedness. These effects are more common with the first dose. Visit your prescriber or health care professional for checks on your progress. You will need to have regular blood work. Report any other side effects. The side effects of this medicine can continue after you finish your treatment. Continue your course of treatment even though you feel ill unless your doctor tells you to stop. Call your doctor or health care professional for advice if you get a fever, chills or sore throat, or other symptoms of a cold or flu. Do not treat yourself. This drug decreases your body's ability to fight infections. Try to avoid being around people who are sick. This medicine may increase your risk to bruise or bleed. Call your doctor or health care professional if you notice any unusual bleeding. Be careful brushing and flossing your teeth or using a toothpick because you may get an infection or bleed more easily. If you have any dental work done, tell your dentist you are receiving this medicine. Avoid taking products that contain aspirin, acetaminophen, ibuprofen, naproxen, or ketoprofen unless instructed by your doctor. These medicines may hide a fever. Do not become pregnant   while taking this medicine. Women should inform their doctor if they wish to become pregnant or think they might be pregnant. There is a potential for serious side effects  to an unborn child. Talk to your health care professional or pharmacist for more information. Do not breast-feed an infant while taking this medicine. What side effects may I notice from receiving this medicine? Side effects that you should report to your doctor or health care professional as soon as possible: -allergic reactions like skin rash, itching or hives, swelling of the face, lips, or tongue -low blood counts - this medicine may decrease the number of white blood cells, red blood cells and platelets. You may be at increased risk for infections and bleeding. -signs of infection - fever or chills, cough, sore throat, pain or difficulty passing urine -signs of decreased platelets or bleeding - bruising, pinpoint red spots on the skin, black, tarry stools, blood in the urine -signs of decreased red blood cells - unusually weak or tired, fainting spells, lightheadedness -breathing problems -confused, not responsive -chest pain -fast, irregular heartbeat -feeling faint or lightheaded, falls -mouth sores -redness, blistering, peeling or loosening of the skin, including inside the mouth -stomach pain -swelling of the ankles, feet, or hands -trouble passing urine or change in the amount of urine Side effects that usually do not require medical attention (report to your doctor or health care professional if they continue or are bothersome): -anxiety -headache -loss of appetite -muscle aches -nausea -night sweats This list may not describe all possible side effects. Call your doctor for medical advice about side effects. You may report side effects to FDA at 1-800-FDA-1088. Where should I keep my medicine? This drug is given in a hospital or clinic and will not be stored at home. NOTE: This sheet is a summary. It may not cover all possible information. If you have questions about this medicine, talk to your doctor, pharmacist, or health care provider.  2017 [employer]vier/[employer] Standard  (2016-04-18 17:23:26)  

## 2016-11-19 NOTE — Progress Notes (Signed)
Pt. Tolerated Rituxan without any problems, pt. To return in 2 weeks.

## 2016-11-29 ENCOUNTER — Encounter: Payer: Self-pay | Admitting: Internal Medicine

## 2016-11-29 ENCOUNTER — Ambulatory Visit (INDEPENDENT_AMBULATORY_CARE_PROVIDER_SITE_OTHER): Payer: 59 | Admitting: Internal Medicine

## 2016-11-29 DIAGNOSIS — M3130 Wegener's granulomatosis without renal involvement: Secondary | ICD-10-CM | POA: Diagnosis not present

## 2016-11-29 DIAGNOSIS — J324 Chronic pansinusitis: Secondary | ICD-10-CM | POA: Diagnosis not present

## 2016-11-29 DIAGNOSIS — J45909 Unspecified asthma, uncomplicated: Secondary | ICD-10-CM

## 2016-11-29 MED ORDER — AZELASTINE-FLUTICASONE 137-50 MCG/ACT NA SUSP: 1.0000 | Freq: Two times a day (BID) | NASAL | 0 refills | Status: DC

## 2016-11-29 MED ORDER — AZELASTINE-FLUTICASONE 137-50 MCG/ACT NA SUSP: 1.0000 | Freq: Two times a day (BID) | NASAL | 11 refills | Status: DC

## 2016-11-29 NOTE — Assessment & Plan Note (Signed)
He feels well and is able to work but requiring ongoing significant immunosuppressive interventions, managed by rheumatology. We agreed absence of symptoms justified skipping chest x-ray this visit. He is getting a CT scan about every 6 months.

## 2016-11-29 NOTE — Assessment & Plan Note (Signed)
He sounds nasal in speech today with significant mucosal edema and nasal airway obstruction. No visible polyps and no bleeding. We discussed a trial of Dymista nasal spray, and perhaps a trial of Nasalcrom.

## 2016-11-29 NOTE — Assessment & Plan Note (Signed)
Mild intermittent asthma symptoms are probably controlled by his intermittent systemic therapies. We did discuss his limited experience with a sample LABA/ LAMA Anoro, compared with LABA/ ICS Dulera. He says he may decide to try Anoro again for comparison. I'm not sure how useful and inhaled steroid is for him if he needs systemic steroids intermittently.

## 2016-11-29 NOTE — Patient Instructions (Signed)
Script and sample/ coupon for Dymista nasal spray      1-2 puffs each nostril once or twice daily. Try this instead of flonase  Consider looking for otc nasal spray Nasalcrom/ Cromol/ cromolyn    Nonsteroid      Ok to use either DulerLebanona or Anoro. Let us know what you need for refills.

## 2016-11-29 NOTE — Progress Notes (Signed)
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 Presented 2011 with nodular infiltrates and progressive weakness/neuropathy. Bronchoscoped 2011- Neg.  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 FVC 4.91/73%, FEV1 4.19/83%, FEV1/FVC 0.85, FEF 25-75% 0.85, TLC 78%, DLCO 113% PFT 06/08/2014-normal lung volumes, minimal slowing and small airway flows with response to bronchodilator, normal diffusion CXR 08/30/2014-Emphysematous and bronchitic changes consistent with COPD. 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 Presented 2011 with nodular infiltrates and progressive weakness/neuropathy. FOLLOWS FOR: Pt is currently on Pred 15mg  QD. Pt feels like he is at his baseline with breathing;uses Dulera and Prednisone to help. Using Dulera 200 and prednisone 15 mg daily. As Qvar if he feels he needs to "ramp up". Just completed course of Rituxan and says as this kicks in he should be able to drop steroids soon. We recalled osteoporosis documented on bone scan-managed by his PCP. We discussed nonspecific inflammatory process in lower lung zones on CT scan. CT chest 10/20/2015 IMPRESSION: 1. Scattered pulmonary nodules which are similar and can be considered benign, given over 2 years of stability. 2. Underlying pattern of slightly lower lobe predominant peribronchovascular ground-glass and micro  nodularity. This is slightly progressive, and likely post infectious or inflammatory 3. Incompletely imaged left lower pole renal atrophy. Electronically Signed  By: Jeronimo GreavesKyle Talbot M.D.  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 Presented 2011 with nodular infiltrates and progressive weakness/neuropathy. 1 year follow up for asthma. Pt states his breathing has been fine and he is not using any breathing medications. He has needed 6 rounds of Rituxin/ Solu-Medrol with next dose upcoming. Currently off prednisone but at times as needed 15 mg daily. Trying to be sparing with medications, he is using Dulera 101 or 2 puffs occasionally, instead of a rescue inhaler. Still has a nebulizer machine. Consistently using Flonase for perennial rhinitis. He denies fever, chills, adenopathy, rash  ROS-see HPI  Constitutional:   No-   weight loss, night sweats, fevers, chills, fatigue, lassitude. HEENT:   No-  headaches, difficulty swallowing, tooth/dental problems, sore throat,       No-  sneezing, itching, ear ache,+ nasal congestion, post nasal drip,  CV:  No-   chest pain, orthopnea, PND, swelling in lower extremities, anasarca,                                                     dizziness, palpitations Resp: +shortness of breath with exertion or at rest.              No-   productive cough,  + non-productive cough,  No- coughing up of blood.              No-   change in color of mucus.  + wheezing.   Skin: No-   rash or lesions. GI:  No-   heartburn, indigestion, abdominal pain, nausea, vomiting,  GU:  MS:  No-   joint  pain or swelling.   Neuro-     nothing unusual Psych:  No- change in mood or affect. No depression or anxiety.  No memory loss.  OBJ- Physical Exam General- Alert, Oriented,  Affect-appropriate, Distress- none acute, tall, thin, + looks well today Skin- no rash Lymphadenopathy- none Head- atraumatic            Eyes- Gross vision intact, PERRLA, conjunctivae and secretions clear            Ears- Hearing, canals-normal            Nose- + marked turbinate edema, no-Septal dev, mucus, polyps, erosion, perforation             Throat- Mallampati II , mucosa clear , drainage- none, tonsils- atrophic Neck- flexible , trachea midline, no stridor , thyroid nl, carotid no bruit Chest - symmetrical excursion , unlabored           Heart/CV- RRR , no murmur , no gallop  , no rub, nl s1 s2                           - JVD- none , edema- none, stasis changes- none, varices- none           Lung-  wheeze-none, cough-none , dullness-none, rub- none           Chest wall-  Abd-  Br/ Gen/ Rectal- Not done, not indicated Extrem- cyanosis- none, clubbing, none, atrophy- none, strength- nl Neuro- grossly intact to observation

## 2016-12-03 ENCOUNTER — Encounter (HOSPITAL_COMMUNITY): Payer: Self-pay

## 2016-12-03 ENCOUNTER — Encounter (HOSPITAL_COMMUNITY)
Admission: RE | Admit: 2016-12-03 | Discharge: 2016-12-03 | Disposition: A | Discharge status: Home / Self Care | Payer: 59 | Source: Ambulatory Visit | Attending: Internal Medicine | Admitting: Internal Medicine

## 2016-12-03 DIAGNOSIS — M317 Microscopic polyangiitis: Secondary | ICD-10-CM | POA: Insufficient documentation

## 2016-12-03 MED ORDER — SODIUM CHLORIDE 0.9 % IV SOLN
INTRAVENOUS | Status: AC
  Administered 2016-12-03: 09:00:00 via INTRAVENOUS

## 2016-12-03 MED ORDER — SODIUM CHLORIDE 0.9 % IV SOLN
500.0000 mg | INTRAVENOUS | Status: AC
  Administered 2016-12-03: 500 mg via INTRAVENOUS
  Filled 2016-12-03: qty 50

## 2016-12-03 MED ORDER — METHYLPREDNISOLONE SODIUM SUCC 125 MG IJ SOLR
100.0000 mg | INTRAMUSCULAR | Status: AC
  Administered 2016-12-03: 100 mg via INTRAVENOUS
  Filled 2016-12-03: qty 2

## 2016-12-03 NOTE — Progress Notes (Signed)
Patient here for second dose (week 2) of Rituxan. Patient took pre meds at home of 1000mg  ibuprofen (he said he took that instead of the Tylenol by mistake) and 10mg  Zyrtec by mouth.. IV solumedrol given as a premed as ordered. Rituxan infusing at present. Patient request to run it at the initial rate (as dose two weeks ago) and not increase rate (as allowed today). Started at 50mg /hr (4225ml/hr on iv pump) and will increase in 50mg  increments every 30 minutes. Wife and father in law at bedside. Informed patient if he feels different in any way to inform RN. Resting comfortably at present.

## 2016-12-03 NOTE — Progress Notes (Signed)
Tolerated Rituxan well. Home with family.

## 2016-12-03 NOTE — Discharge Instructions (Signed)
Call MD for any problems or questions. Call 911 for an emergency.     Rituximab injection What is this medicine? RITUXIMAB (ri TUX i mab) is a monoclonal antibody. It is used to treat non-Hodgkin lymphoma and chronic lymphocytic leukemia. It is also used to treat rheumatoid arthritis (RA). In RA, this medicine slows the inflammatory process and help reduce joint pain and swelling. This medicine is often used with other cancer or arthritis medications. This medicine may be used for other purposes; ask your health care provider or pharmacist if you have questions. COMMON BRAND NAME(S): Rituxan What should I tell my health care provider before I take this medicine? They need to know if you have any of these conditions: -heart disease -infection (especially a virus infection such as hepatitis B, chickenpox, cold sores, or herpes) -immune system problems -irregular heartbeat -kidney disease -lung or breathing disease, like asthma -recently received or scheduled to receive a vaccine -an unusual or allergic reaction to rituximab, mouse proteins, other medicines, foods, dyes, or preservatives -pregnant or trying to get pregnant -breast-feeding How should I use this medicine? This medicine is for infusion into a vein. It is administered in a hospital or clinic by a specially trained health care professional. A special MedGuide will be given to you by the pharmacist with each prescription and refill. Be sure to read this information carefully each time. Talk to your pediatrician regarding the use of this medicine in children. This medicine is not approved for use in children. Overdosage: If you think you have taken too much of this medicine contact a poison control center or emergency room at once. NOTE: This medicine is only for you. Do not share this medicine with others. What if I miss a dose? It is important not to miss a dose. Call your doctor or health care professional if you are unable  to keep an appointment. What may interact with this medicine? -cisplatin -medicines for blood pressure -some other medicines for arthritis -vaccines This list may not describe all possible interactions. Give your health care provider a list of all the medicines, herbs, non-prescription drugs, or dietary supplements you use. Also tell them if you smoke, drink alcohol, or use illegal drugs. Some items may interact with your medicine. What should I watch for while using this medicine? Report any side effects that you notice during your treatment right away, such as changes in your breathing, fever, chills, dizziness or lightheadedness. These effects are more common with the first dose. Visit your prescriber or health care professional for checks on your progress. You will need to have regular blood work. Report any other side effects. The side effects of this medicine can continue after you finish your treatment. Continue your course of treatment even though you feel ill unless your doctor tells you to stop. Call your doctor or health care professional for advice if you get a fever, chills or sore throat, or other symptoms of a cold or flu. Do not treat yourself. This drug decreases your body's ability to fight infections. Try to avoid being around people who are sick. This medicine may increase your risk to bruise or bleed. Call your doctor or health care professional if you notice any unusual bleeding. Be careful brushing and flossing your teeth or using a toothpick because you may get an infection or bleed more easily. If you have any dental work done, tell your dentist you are receiving this medicine. Avoid taking products that contain aspirin, acetaminophen, ibuprofen, naproxen, or  ketoprofen unless instructed by your doctor. These medicines may hide a fever. Do not become pregnant while taking this medicine. Women should inform their doctor if they wish to become pregnant or think they might be  pregnant. There is a potential for serious side effects to an unborn child. Talk to your health care professional or pharmacist for more information. Do not breast-feed an infant while taking this medicine. What side effects may I notice from receiving this medicine? Side effects that you should report to your doctor or health care professional as soon as possible: -allergic reactions like skin rash, itching or hives, swelling of the face, lips, or tongue -low blood counts - this medicine may decrease the number of white blood cells, red blood cells and platelets. You may be at increased risk for infections and bleeding. -signs of infection - fever or chills, cough, sore throat, pain or difficulty passing urine -signs of decreased platelets or bleeding - bruising, pinpoint red spots on the skin, black, tarry stools, blood in the urine -signs of decreased red blood cells - unusually weak or tired, fainting spells, lightheadedness -breathing problems -confused, not responsive -chest pain -fast, irregular heartbeat -feeling faint or lightheaded, falls -mouth sores -redness, blistering, peeling or loosening of the skin, including inside the mouth -stomach pain -swelling of the ankles, feet, or hands -trouble passing urine or change in the amount of urine Side effects that usually do not require medical attention (report to your doctor or health care professional if they continue or are bothersome): -anxiety -headache -loss of appetite -muscle aches -nausea -night sweats This list may not describe all possible side effects. Call your doctor for medical advice about side effects. You may report side effects to FDA at 1-800-FDA-1088. Where should I keep my medicine? This drug is given in a hospital or clinic and will not be stored at home. NOTE: This sheet is a summary. It may not cover all possible information. If you have questions about this medicine, talk to your doctor, pharmacist, or  health care provider.  2017 Elsevier/Gold Standard (2016-04-18 17:23:26)

## 2016-12-09 MED FILL — SULFAMETHOXAZOLE/TMP DS TAB: 800-160 | 28 days supply | Qty: 12 | Fill #2

## 2017-01-06 MED FILL — SULFAMETHOXAZOLE/TMP DS TAB: 800-160 | 28 days supply | Qty: 12 | Fill #3

## 2017-01-07 ENCOUNTER — Encounter: Payer: Self-pay | Admitting: Family Medicine

## 2017-01-07 ENCOUNTER — Ambulatory Visit (INDEPENDENT_AMBULATORY_CARE_PROVIDER_SITE_OTHER): Payer: 59 | Admitting: Family Medicine

## 2017-01-07 VITALS — BP 108/64 | HR 65 | Ht 79.0 in | Wt 174.0 lb

## 2017-01-07 DIAGNOSIS — J3489 Other specified disorders of nose and nasal sinuses: Secondary | ICD-10-CM

## 2017-01-07 MED ORDER — AMOXICILLIN-POT CLAVULANATE 875-125 MG PO TABS: 1.0000 | ORAL_TABLET | Freq: Two times a day (BID) | ORAL | 0 refills | Status: DC

## 2017-01-07 MED FILL — AMOX TR-K CLV 875-125 MG TA: 875-125 | 10 days supply | Qty: 20 | Fill #0
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
## 2017-01-25 ENCOUNTER — Emergency Department
Admission: EM | Admit: 2017-01-25 | Discharge: 2017-01-25 | Disposition: A | Discharge status: Home / Self Care | Payer: 59 | Source: Home / Self Care | Attending: Family Medicine | Admitting: Family Medicine

## 2017-01-25 ENCOUNTER — Emergency Department (INDEPENDENT_AMBULATORY_CARE_PROVIDER_SITE_OTHER): Payer: 59

## 2017-01-25 ENCOUNTER — Encounter: Payer: Self-pay | Admitting: Emergency Medicine

## 2017-01-25 DIAGNOSIS — Z23 Encounter for immunization: Secondary | ICD-10-CM

## 2017-01-25 DIAGNOSIS — W6133XA Pecked by chicken, initial encounter: Secondary | ICD-10-CM

## 2017-01-25 DIAGNOSIS — S81831A Puncture wound without foreign body, right lower leg, initial encounter: Secondary | ICD-10-CM

## 2017-01-25 DIAGNOSIS — S81031A Puncture wound without foreign body, right knee, initial encounter: Secondary | ICD-10-CM | POA: Diagnosis not present

## 2017-01-25 DIAGNOSIS — M25561 Pain in right knee: Secondary | ICD-10-CM | POA: Diagnosis not present

## 2017-01-25 MED ORDER — DOXYCYCLINE HYCLATE 100 MG PO CAPS: 100.0000 mg | ORAL_CAPSULE | Freq: Two times a day (BID) | ORAL | 0 refills | Status: DC

## 2017-01-25 MED ORDER — TETANUS-DIPHTH-ACELL PERTUSSIS 5-2.5-18.5 LF-MCG/0.5 IM SUSP
0.5000 mL | Freq: Once | INTRAMUSCULAR | Status: AC
  Administered 2017-01-25: 0.5 mL via INTRAMUSCULAR

## 2017-01-25 NOTE — ED Provider Notes (Signed)
CSN: [id number]     Arrival date & time 01/25/17  1045 History   First MD Initiated Contact with Patient 01/25/17 1131     Chief Complaint  Patient presents with  . Puncture Wound   (Consider location/radiation/quality/duration/timing/severity/associated sxs/prior Treatment) HPI Jesse Wong is a 36 y.o. male presenting to UC with c/o two puncture wounds to his Right lower leg, one just below knee over tibial tuberosity.  Pt notes he was attacked by a rooster [place] a [place]ground yesterday, wounds are from the rooster's talons.  Pt is concerned it may have injured his tendon as the pain is worse with walking, intermittent sharp and dull, 5/10.  Last tetanus- unknown.  He notes he just completed a course of augmentin for a sinus related infection. Denies fever or chills.    Past Medical History:  Diagnosis Date  . Allergy   . Arthritis   . Asthma   . Chronic prostatitis   . Myalgia   . Pulmonary infiltrates   . Sinusitis, chronic   . Tonsillar abscess   . Vasculitis [facility])    Past Surgical History:  Procedure Laterality Date  . BRONCHOSCOPY  2011   TBBX ----inflammation  . peritonsilar abscess    . TYMPANOPLASTY     Family History  Problem Relation Age of Onset  . Heart attack      grandmother  . Hyperlipidemia Father   . Thyroid disease Father     hashimoto's thyroidistis  . Heart disease Paternal Grandfather    Social History  Substance Use Topics  . Smoking status: Never Smoker  . Smokeless tobacco: Never Used  . Alcohol use Yes    Review of Systems  Constitutional: Negative for chills and fever.  Musculoskeletal: Positive for arthralgias and gait problem. Negative for joint swelling and myalgias.  Skin: Positive for wound. Negative for color change.  Neurological: Negative for weakness and numbness.    Allergies  Patient has no known allergies.  Home Medications   Prior to Admission medications   Medication Sig Start Date End Date Taking? Authorizing  Provider  albuterol (PROVENTIL HFA;VENTOLIN HFA) 108 (90 BASE) MCG/ACT inhaler Inhale 2 puffs into the lungs every 4 (four) hours as needed for wheezing or shortness of breath. 04/08/14   Waymon Budge, MD  albuterol (PROVENTIL) (2.5 MG/3ML) 0.083% nebulizer solution Take 3 mLs (2.5 mg total) by nebulization every 6 (six) hours as needed for wheezing or shortness of breath. 08/30/14   Waymon Budge, MD  alendronate (FOSAMAX) 70 MG tablet TAKE 1 TABLET BY MOUTH EVERY 7 DAYS. TAKE WITH A FULL GLASS OF WATER ON AN EMPTY STOMACH. 05/15/15   Agapito Games, MD  amoxicillin-clavulanate (AUGMENTIN) 875-125 MG tablet Take 1 tablet by mouth 2 (two) times daily. 01/07/17   Agapito Games, MD  Azelastine HCl 0.15 % SOLN Place 1 spray into the nose 2 (two) times daily. 09/23/16   Agapito Games, MD  Azelastine-Fluticasone 137-50 MCG/ACT SUSP Place 1 puff into the nose 2 (two) times daily. 11/29/16   Waymon Budge, MD  beclomethasone (QVAR) 80 MCG/ACT inhaler Inhale 2 puffs into the lungs 2 (two) times daily. 02/13/15   Waymon Budge, MD  cholecalciferol (VITAMIN D) 1000 UNITS tablet Take 1 tablet (1,000 Units total) by mouth daily. 05/18/14   Agapito Games, MD  doxycycline (VIBRAMYCIN) 100 MG capsule Take 1 capsule (100 mg total) by mouth 2 (two) times daily. One po bid x 7 days 01/25/17  Junius Finner, PA-C  DULERA 100-5 MCG/ACT AERO INHALE 2 PUFFS TWICE DAILY. RINSE MOUTH AFTERWARD. 11/08/16   Waymon Budge, MD  fluticasone (FLONASE) 50 MCG/ACT nasal spray INSTILL 2 SPRAYS INTO EACH NOSTRIL DAILY. 09/26/16   Agapito Games, MD  Nebulizers (COMPRESSOR/NEBULIZER) MISC Use as direccted 08/30/14   Waymon Budge, MD  sulfamethoxazole-trimethoprim (BACTRIM DS,SEPTRA DS) 800-160 MG tablet Taking 3 times a wk 01/06/17   Historical Provider, MD   Meds Ordered and Administered this Visit   Medications  Tdap (BOOSTRIX) injection 0.5 mL (0.5 mLs Intramuscular Given 01/25/17 1125)    BP  124/83 (BP Location: Left Arm)   Pulse 86   Temp 97.3 F (36.3 C) (Oral)   Resp 16   Ht  (2.007 m)   Wt 175 lb 4 oz (79.5 kg)   SpO2 98%   BMI 19.74 kg/m  No data found.   Physical Exam  Constitutional: He is oriented to person, place, and time. He appears well-developed and well-nourished.  HENT:  Head: Normocephalic and atraumatic.  Eyes: EOM are normal.  Neck: Normal range of motion.  Cardiovascular: Normal rate.   Pulmonary/Chest: Effort normal.  Musculoskeletal: He exhibits tenderness. He exhibits no edema.       Legs: Right knee: tenderness over tibial tuberosity. Superficial appearing wound overtop. Scant red blood. No active bleeding.  Decreased flexion due to pain. Calf is soft, non-tender.  Neurological: He is alert and oriented to person, place, and time.  Skin: Skin is warm and dry. Capillary refill takes less than 2 seconds.  Superficial appearing puncture wounds over Right tibial tuberosity and Right lower lateral leg.  Psychiatric: He has a normal mood and affect. His behavior is normal.  Nursing note and vitals reviewed.   Urgent Care Course     Procedures (including critical care time)  Labs Review Labs Reviewed - No data to display  Imaging Review Dg Knee Complete 4 Views Right  Result Date: 01/25/2017 CLINICAL DATA:  Puncture wound right knee along the patella with pain. EXAM: RIGHT KNEE - COMPLETE 4+ VIEW COMPARISON:  None available FINDINGS: No evidence of fracture, dislocation, or joint effusion. No evidence of arthropathy or other focal bone abnormality. Soft tissues are unremarkable. Incidental sclerotic bone island present in the right medial tibial plateau. IMPRESSION: No acute finding by plain radiography. Electronically Signed   By: Judie Petit.  Shick M.D.   On: 01/25/2017 12:02      MDM   1. Puncture wound of right knee, initial encounter   2. Puncture wound of right lower leg, initial encounter    Tdap updated today in UC  Plain films:  no evidence of fluid or gas around patellar tendon.  Wounds cleaned, bacitracin and bandages applied. Knee splint applied and crutches provided for comfort.  Rx: doxycycline to help prevent infection f/u with PCP or Sports Medicine later this week if not improving, sooner if worsening.     Junius Finner, PA-C 01/25/17 1316

## 2017-01-25 NOTE — ED Triage Notes (Signed)
Patient presents to [facility] with C/O two puncture wounds on right lower leg one below knee, second on the lateral aspect, injury occurred  from a rooster yesterday. C/O pain sharp/dull with bending the knee. 5/10. Last tetanus unknown.

## 2017-01-27 ENCOUNTER — Ambulatory Visit (INDEPENDENT_AMBULATORY_CARE_PROVIDER_SITE_OTHER): Payer: 59 | Admitting: Family Medicine

## 2017-01-27 ENCOUNTER — Encounter: Payer: Self-pay | Admitting: Family Medicine

## 2017-01-27 VITALS — BP 137/59 | HR 69 | Ht 79.0 in | Wt 179.0 lb

## 2017-01-27 DIAGNOSIS — S81031A Puncture wound without foreign body, right knee, initial encounter: Secondary | ICD-10-CM | POA: Diagnosis not present

## 2017-01-27 MED ORDER — CIPROFLOXACIN HCL 500 MG PO TABS: 500.0000 mg | ORAL_TABLET | Freq: Two times a day (BID) | ORAL | 0 refills | Status: DC

## 2017-01-27 MED ORDER — CEFTRIAXONE SODIUM 1 G IJ SOLR
1.0000 g | Freq: Once | INTRAMUSCULAR | Status: AC
  Administered 2017-01-27: 1 g via INTRAMUSCULAR

## 2017-01-27 MED FILL — CIPROFLOXACIN HCL 500 MG TA: 500 | 10 days supply | Qty: 20 | Fill #0

## 2017-01-27 NOTE — Progress Notes (Signed)
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  He was attacked by the bird on 01/24/17.  He was defending his child from an aggressive Rooster who attacked him with spurs. The wound immediately began to hurt.  He presents at Urgent Care the next day where he received a tetanus shot. He was also prescribed doxycycline.  At today's visit, the lesion on his anterior right lower leg is erythematous and warm to the touch.  The right knee appears swollen relative to the left.  He has not noticed the swelling until it was pointed out to him in clinic today.  He has pain with knee flexion.  He has not noticed a change in his leg since Saturday.  He denies any fevers or chills.  Past medical history, Surgical history, Family history not pertinant except as noted below, Social history, Allergies, and medications have been entered into the medical record, reviewed, and no changes needed.   Review of Systems: No headache, visual changes, nausea, vomiting, diarrhea, constipation, dizziness, abdominal pain, skin rash, fevers, chills, night sweats, weight loss, swollen lymph nodes, body aches, joint swelling, muscle aches, chest pain, shortness of breath, mood changes, visual or auditory hallucinations.   Objective:    Vitals:   01/27/17 1520  BP: (!) 137/59  Pulse: 69   General: Well Developed, well nourished, and in no acute distress.  Neuro/Psych: Alert and oriented x3, extra-ocular muscles intact, able to move all 4 extremities, sensation grossly intact. Skin: Warm and dry, no rashes noted.  Respiratory: Not using accessory muscles, speaking in full sentences, trachea  midline.  Cardiovascular: Pulses palpable, no extremity edema. Abdomen: Does not appear distended. MSK: right knee otherwise normal-appearing with no significant effusion. It is not tender except for the area of erythematous with a small puncture wound at the proximal aspect of the anterior tibia This is just distal to the insertion of the patellar tendon. There is no surrounding fluctuance or induration. Patient experiences pain with knee flexion beyond about 90. He is intact extension and flexion strength. Pulses capillary refill and sensation are intact distally.  1 g of IM ceftriaxone given prior to discharge.  Limited musculoskeletal ultrasound of the right knee does not reveal any significant effusion, or obvious abscess collection. There is no obvious prepatellar bursitis or tenosynovitis. There is evidence of cellulitis at the area of erythema with marbled appearance of the subcutaneous tissue with increased vascular activity on Doppler ultrasound.  CLINICAL DATA:  Puncture wound right knee along the patella with pain.  EXAM: RIGHT KNEE - COMPLETE 4+ VIEW  COMPARISON:  None available  FINDINGS: No evidence of fracture, dislocation, or joint effusion. No evidence of arthropathy or other focal bone abnormality. Soft tissues are unremarkable. Incidental sclerotic bone island present in the right medial tibial plateau.  IMPRESSION: No acute finding by plain radiography.   Electronically Signed   By: Judie Petit.  Shick M.D.   On: 01/25/2017 12:02  Impression and Recommendations:    Assessment and Plan: 35 y.o. male with a PMH of wegener's granulomatosis with immunocompromise status who presents with right lower leg knee swelling . His is obviously  concerning for septic arthritis versus septic prepatellar bursitis versus septic tenosynovitis versus osteomyelitis. However I think the most likely thing here is cellulitis. His immunocompromise status does raise the steaks here. I  think at this point we have caught it early enough that it has not yet formed an abscess.   Plan to broaden the antibiotic coverage to include anaerobes and gram negatives with  Cipro. This will also cover Pseudomonas which is a soil bacteria.  Plan to obtain an MRI in the near future of the right knee to evaluate for potential tenosynovitis or septic arthritis. Additionally we'll give 1 g of ceftriaxone prior to discharge as well.   Discussed warning signs or symptoms. Please see discharge instructions. Patient expresses understanding.

## 2017-01-27 NOTE — Patient Instructions (Addendum)
Thank you for coming in today. Get MRI ASAP.  Continue doxycycline.  START CIPRO twice daily today.  I will contact you with results ASAP.   MRI is tomorrow at [facility] at 4pm.  A non-contrast MRI should show enough.

## 2017-01-28 ENCOUNTER — Ambulatory Visit (HOSPITAL_COMMUNITY)
Admission: RE | Admit: 2017-01-28 | Discharge: 2017-01-28 | Disposition: A | Discharge status: Home / Self Care | Payer: 59 | Source: Ambulatory Visit | Attending: Family Medicine | Admitting: Family Medicine

## 2017-01-28 ENCOUNTER — Ambulatory Visit (HOSPITAL_COMMUNITY): Payer: 59

## 2017-01-28 DIAGNOSIS — M25561 Pain in right knee: Secondary | ICD-10-CM | POA: Diagnosis not present

## 2017-01-28 DIAGNOSIS — X58XXXA Exposure to other specified factors, initial encounter: Secondary | ICD-10-CM | POA: Diagnosis not present

## 2017-01-28 DIAGNOSIS — S81031A Puncture wound without foreign body, right knee, initial encounter: Secondary | ICD-10-CM | POA: Insufficient documentation

## 2017-01-28 DIAGNOSIS — L03818 Cellulitis of other sites: Secondary | ICD-10-CM | POA: Insufficient documentation

## 2017-01-28 DIAGNOSIS — S81001A Unspecified open wound, right knee, initial encounter: Secondary | ICD-10-CM | POA: Diagnosis not present

## 2017-01-29 ENCOUNTER — Other Ambulatory Visit: Payer: Self-pay

## 2017-01-29 ENCOUNTER — Telehealth: Payer: Self-pay | Admitting: Family Medicine

## 2017-01-29 ENCOUNTER — Other Ambulatory Visit: Payer: Self-pay | Admitting: Family Medicine

## 2017-01-29 DIAGNOSIS — M8610 Other acute osteomyelitis, unspecified site: Secondary | ICD-10-CM | POA: Insufficient documentation

## 2017-01-29 DIAGNOSIS — M86161 Other acute osteomyelitis, right tibia and fibula: Secondary | ICD-10-CM

## 2017-01-29 DIAGNOSIS — S81031A Puncture wound without foreign body, right knee, initial encounter: Secondary | ICD-10-CM

## 2017-01-29 MED ORDER — CEFDINIR 300 MG PO CAPS: 300.0000 mg | ORAL_CAPSULE | Freq: Two times a day (BID) | ORAL | 0 refills | Status: DC

## 2017-01-29 MED ORDER — DEXTROSE 5 % IV SOLN: 2.0000 g | INTRAVENOUS | 1 refills | Status: AC

## 2017-01-29 MED FILL — CEFDINIR 300 MG CAPSULE: 300 | 14 days supply | Qty: 28 | Fill #0

## 2017-01-29 NOTE — Telephone Encounter (Signed)
MRI report shows osteomyelitis. Patient notes that he had skin considerable improvement in symptoms with injectable ceftriaxone but the redness has returned despite Cipro and doxycycline. Plan to call in Omnicef and stop Cipro. Refer to infectious disease. I think patient probably would benefit from several weeks of IV ceftriaxone but I certainly appreciate infectious disease recommendations as the patient is a bit immunocompromised as he is on rituximab.

## 2017-01-29 NOTE — Telephone Encounter (Signed)
Plan for home health IV ABX (ceftriaxone x 6 weeks)

## 2017-01-30 ENCOUNTER — Ambulatory Visit (HOSPITAL_COMMUNITY)
Admission: RE | Admit: 2017-01-30 | Discharge: 2017-01-30 | Disposition: A | Discharge status: Home / Self Care | Payer: 59 | Source: Ambulatory Visit | Attending: Family Medicine | Admitting: Family Medicine

## 2017-01-30 ENCOUNTER — Other Ambulatory Visit: Payer: Self-pay | Admitting: Family Medicine

## 2017-01-30 ENCOUNTER — Encounter (HOSPITAL_COMMUNITY): Payer: Self-pay | Admitting: Interventional Radiology

## 2017-01-30 DIAGNOSIS — M317 Microscopic polyangiitis: Secondary | ICD-10-CM | POA: Diagnosis not present

## 2017-01-30 DIAGNOSIS — M869 Osteomyelitis, unspecified: Secondary | ICD-10-CM | POA: Diagnosis not present

## 2017-01-30 DIAGNOSIS — S81031A Puncture wound without foreign body, right knee, initial encounter: Secondary | ICD-10-CM

## 2017-01-30 DIAGNOSIS — Z452 Encounter for adjustment and management of vascular access device: Secondary | ICD-10-CM | POA: Diagnosis not present

## 2017-01-30 DIAGNOSIS — I878 Other specified disorders of veins: Secondary | ICD-10-CM | POA: Insufficient documentation

## 2017-01-30 HISTORY — PX: IR FLUORO GUIDE CV MIDLINE PICC RIGHT: IMG5212

## 2017-01-30 HISTORY — PX: IR US GUIDE VASC ACCESS RIGHT: IMG2390

## 2017-01-30 MED ORDER — HEPARIN SOD (PORK) LOCK FLUSH 100 UNIT/ML IV SOLN
INTRAVENOUS | Status: AC
  Filled 2017-01-30: qty 5

## 2017-01-30 MED ORDER — LIDOCAINE HCL (PF) 1 % IJ SOLN
INTRAMUSCULAR | Status: AC
  Filled 2017-01-30: qty 30

## 2017-01-30 MED ORDER — LIDOCAINE HCL 1 % IJ SOLN
INTRAMUSCULAR | Status: DC | PRN
  Administered 2017-01-30: 10 mL

## 2017-01-30 NOTE — Procedures (Signed)
Successful placement of single lumen PICC line to right basilic vein. Length 42cm Tip at lower SVC/RA No complications Ready for use.  Hoyt Koch PA-C 9:42 AM

## 2017-01-31 ENCOUNTER — Encounter (HOSPITAL_COMMUNITY): Payer: Self-pay | Admitting: Interventional Radiology

## 2017-01-31 ENCOUNTER — Ambulatory Visit (HOSPITAL_COMMUNITY)
Admission: RE | Admit: 2017-01-31 | Discharge: 2017-01-31 | Disposition: A | Discharge status: Home / Self Care | Payer: 59 | Source: Ambulatory Visit | Attending: Interventional Radiology | Admitting: Interventional Radiology

## 2017-01-31 ENCOUNTER — Other Ambulatory Visit (HOSPITAL_COMMUNITY): Payer: Self-pay | Admitting: Interventional Radiology

## 2017-01-31 DIAGNOSIS — Y712 Prosthetic and other implants, materials and accessory cardiovascular devices associated with adverse incidents: Secondary | ICD-10-CM | POA: Diagnosis not present

## 2017-01-31 DIAGNOSIS — T82898A Other specified complication of vascular prosthetic devices, implants and grafts, initial encounter: Secondary | ICD-10-CM | POA: Diagnosis not present

## 2017-01-31 DIAGNOSIS — S81031A Puncture wound without foreign body, right knee, initial encounter: Secondary | ICD-10-CM

## 2017-01-31 DIAGNOSIS — T82598A Other mechanical complication of other cardiac and vascular devices and implants, initial encounter: Secondary | ICD-10-CM | POA: Diagnosis not present

## 2017-01-31 DIAGNOSIS — W3400XA Accidental discharge from unspecified firearms or gun, initial encounter: Secondary | ICD-10-CM

## 2017-01-31 HISTORY — PX: IR FLUORO GUIDE CV LINE RIGHT: IMG2283

## 2017-01-31 MED ORDER — CHLORHEXIDINE GLUCONATE 4 % EX LIQD
CUTANEOUS | Status: AC
  Filled 2017-01-31: qty 15

## 2017-01-31 MED ORDER — HEPARIN SOD (PORK) LOCK FLUSH 100 UNIT/ML IV SOLN
INTRAVENOUS | Status: AC
Start: 2017-01-31 — End: 2017-01-31
  Filled 2017-01-31: qty 5

## 2017-01-31 MED ORDER — LIDOCAINE HCL 1 % IJ SOLN
INTRAMUSCULAR | Status: DC | PRN
  Administered 2017-01-31: 5 mL

## 2017-01-31 MED ORDER — LIDOCAINE HCL 1 % IJ SOLN
INTRAMUSCULAR | Status: AC
  Filled 2017-01-31: qty 20

## 2017-01-31 NOTE — Procedures (Signed)
Successful placement of right upper extremity approach 35 cm single lumen PICC line with tip terminating within the mid SVC. EBL: None No immediate post procedural complication. The PICC line is ready for immediate use.  Katherina Right, MD Pager #: [phone]

## 2017-02-03 DIAGNOSIS — L4059 Other psoriatic arthropathy: Secondary | ICD-10-CM | POA: Diagnosis not present

## 2017-02-03 DIAGNOSIS — Z6820 Body mass index (BMI) 20.0-20.9, adult: Secondary | ICD-10-CM | POA: Diagnosis not present

## 2017-02-03 DIAGNOSIS — M317 Microscopic polyangiitis: Secondary | ICD-10-CM | POA: Diagnosis not present

## 2017-02-03 DIAGNOSIS — Z7952 Long term (current) use of systemic steroids: Secondary | ICD-10-CM | POA: Diagnosis not present

## 2017-02-03 DIAGNOSIS — M869 Osteomyelitis, unspecified: Secondary | ICD-10-CM | POA: Diagnosis not present

## 2017-02-03 LAB — BASIC METABOLIC PANEL
BUN: 19 mg/dL (ref 4–21)
Creatinine: 0.8 mg/dL (ref 0.6–1.3)
Glucose: 80 mg/dL
POTASSIUM: 3.1 mmol/L — AB (ref 3.4–5.3)
SODIUM: 143 mmol/L (ref 137–147)

## 2017-02-03 LAB — CBC WITH DIFFERENTIAL/PLATELET: Sedimentation Rate-Westergren: 3

## 2017-02-03 LAB — CBC AND DIFFERENTIAL
Hemoglobin: 16.1 g/dL (ref 13.5–17.5)
Platelets: 242 10*3/uL (ref 150–399)
WBC: 6 10*3/mL

## 2017-02-03 LAB — EP+4AC
CALCIUM: 9.7 mg/dL
CHLORIDE: 95 mmol/L
CO2: 25

## 2017-02-06 DIAGNOSIS — M869 Osteomyelitis, unspecified: Secondary | ICD-10-CM | POA: Diagnosis not present

## 2017-02-06 DIAGNOSIS — M317 Microscopic polyangiitis: Secondary | ICD-10-CM | POA: Diagnosis not present

## 2017-02-07 ENCOUNTER — Telehealth: Payer: Self-pay | Admitting: Family Medicine

## 2017-02-07 MED ORDER — POTASSIUM CHLORIDE CRYS ER 20 MEQ PO TBCR: 20.0000 meq | EXTENDED_RELEASE_TABLET | Freq: Two times a day (BID) | ORAL | 0 refills | Status: DC

## 2017-02-07 MED FILL — POTASSIUM CL ER 20 MEQ TAB: 20 | 30 days supply | Qty: 60 | Fill #0

## 2017-02-07 NOTE — Telephone Encounter (Signed)
Labs back from [place].   WBC 6.0  K 3.1 Cl 95 Creat 0.82 ESR 3   Plan to start oral potassium and recheck labs in 2 weeks.

## 2017-02-07 NOTE — Telephone Encounter (Signed)
Information discussed with pt. Pt verbalized understanding. 

## 2017-02-10 DIAGNOSIS — M869 Osteomyelitis, unspecified: Secondary | ICD-10-CM | POA: Diagnosis not present

## 2017-02-10 DIAGNOSIS — M317 Microscopic polyangiitis: Secondary | ICD-10-CM | POA: Diagnosis not present

## 2017-02-11 ENCOUNTER — Encounter: Payer: Self-pay | Admitting: Family Medicine

## 2017-02-12 ENCOUNTER — Telehealth: Payer: Self-pay | Admitting: Family Medicine

## 2017-02-12 NOTE — Telephone Encounter (Signed)
Call patient: Received new labs from lab core dated April 23. Sodium 146, potassium 3.1, creatinine 0.74, calcium 9.2, white blood cell count 5.0, hemoglobin 16, platelets 220.  Please call patient: Potassium is still low at 3.1. I think he is taking a potassium supplement as instructed by Dr. Denyse Amass. Please verify the dosing as we may need to adjust that and then repeat his potassium in one week.  Nani Gasser, MD

## 2017-02-13 NOTE — Telephone Encounter (Signed)
Results previously reviewed with pt.

## 2017-02-14 DIAGNOSIS — M869 Osteomyelitis, unspecified: Secondary | ICD-10-CM | POA: Diagnosis not present

## 2017-02-14 DIAGNOSIS — M317 Microscopic polyangiitis: Secondary | ICD-10-CM | POA: Diagnosis not present

## 2017-02-14 NOTE — Telephone Encounter (Signed)
Please verify the dosing as we may need to adjust that and then repeat his potassium in one week

## 2017-02-17 DIAGNOSIS — M869 Osteomyelitis, unspecified: Secondary | ICD-10-CM | POA: Diagnosis not present

## 2017-02-17 DIAGNOSIS — M317 Microscopic polyangiitis: Secondary | ICD-10-CM | POA: Diagnosis not present

## 2017-02-17 LAB — BASIC METABOLIC PANEL
Creatinine: 0.8 mg/dL (ref 0.6–1.3)
POTASSIUM: 3.1 mmol/L — AB (ref 3.4–5.3)
Sodium: 145 mmol/L (ref 137–147)

## 2017-02-17 LAB — CBC AND DIFFERENTIAL: WBC: 4.9 10^3/mL

## 2017-02-17 LAB — POCT ERYTHROCYTE SEDIMENTATION RATE, NON-AUTOMATED: Sed Rate: 5 mm

## 2017-02-18 NOTE — Telephone Encounter (Signed)
Left message for patient to call back to verify potassium dose

## 2017-02-20 ENCOUNTER — Ambulatory Visit (INDEPENDENT_AMBULATORY_CARE_PROVIDER_SITE_OTHER): Payer: 59 | Admitting: Internal Medicine

## 2017-02-20 ENCOUNTER — Encounter: Payer: Self-pay | Admitting: Internal Medicine

## 2017-02-20 ENCOUNTER — Telehealth: Payer: Self-pay | Admitting: *Deleted

## 2017-02-20 VITALS — BP 116/77 | HR 64 | Temp 97.8°F | Ht 79.0 in | Wt 176.0 lb

## 2017-02-20 DIAGNOSIS — M8610 Other acute osteomyelitis, unspecified site: Secondary | ICD-10-CM | POA: Diagnosis not present

## 2017-02-20 NOTE — Progress Notes (Signed)
[place] for Infectious Disease      Reason for Consult: acute osteomyelitis    Referring Physician: Dr. Georgina Snell    Patient ID: Jesse Wong, male    DOB: Jun 02, 1981, 36 y.o.   MRN: [id number]  HPI:   Here for evaluation of acute osteomyelitis.  He had a puncture [place] from [place] from the talon's of the rooster.  Felt to have gone into the tendon above tubercle tuberosity.  Significant bleeding.  Started on oral antibiotics but then developed swelling.  Puncture closed soon after attack and no drainage developed but he did have significant pain.  No discharge, some mild surrounding erythema.  He is on rituximab for a type of Wegener's.  He takes Bactrim prophylaxis.  He was started on ceftriaxone on 4/11 and has been on it via picc line and home health since.  No associated n/v/d.  No rash.  ESR 3 noted in note from primary office. Had an MRI that suggests osteomyelitis at the site of the puncture.   MRI independently reviewed and small area of edema at tibial tuberosity.  Previous record reviewed and he is followed by pulmonary and rheumatology and last rituximab noted was in February.    Past Medical History:  Diagnosis Date  . Allergy   . Arthritis   . Asthma   . Chronic prostatitis   . Myalgia   . Pulmonary infiltrates   . Sinusitis, chronic   . Tonsillar abscess   . Vasculitis ([place])     Prior to Admission medications   Medication Sig Start Date End Date Taking? Authorizing Provider  albuterol (PROVENTIL HFA;VENTOLIN HFA) 108 (90 BASE) MCG/ACT inhaler Inhale 2 puffs into the lungs every 4 (four) hours as needed for wheezing or shortness of breath. 04/08/14  Yes Deneise Lever, MD  albuterol (PROVENTIL) (2.5 MG/3ML) 0.083% nebulizer solution Take 3 mLs (2.5 mg total) by nebulization every 6 (six) hours as needed for wheezing or shortness of breath. 08/30/14  Yes Deneise Lever, MD  alendronate (FOSAMAX) 70 MG tablet TAKE 1 TABLET BY MOUTH EVERY 7  DAYS. TAKE WITH A FULL GLASS OF WATER ON AN EMPTY STOMACH. 05/15/15  Yes Hali Marry, MD  Azelastine-Fluticasone 137-50 MCG/ACT SUSP Place 1 puff into the nose 2 (two) times daily. 11/29/16  Yes Deneise Lever, MD  cefTRIAXone 2 g in dextrose 5 % 50 mL Inject 2 g into the vein daily. QS x 6 weeks.  Via PICC line per home health 01/29/17 03/12/17 Yes Gregor Hams, MD  cholecalciferol (VITAMIN D) 1000 UNITS tablet Take 1 tablet (1,000 Units total) by mouth daily. 05/18/14  Yes Hali Marry, MD  DULERA 100-5 MCG/ACT AERO INHALE 2 PUFFS TWICE DAILY. RINSE MOUTH AFTERWARD. 11/08/16  Yes Deneise Lever, MD  fluticasone (FLONASE) 50 MCG/ACT nasal spray INSTILL 2 SPRAYS INTO EACH NOSTRIL DAILY. 09/26/16  Yes Hali Marry, MD  Nebulizers (COMPRESSOR/NEBULIZER) MISC Use as direccted 08/30/14  Yes Deneise Lever, MD  potassium chloride SA (K-DUR,KLOR-CON) 20 MEQ tablet Take 1 tablet (20 mEq total) by mouth 2 (two) times daily. 02/07/17  Yes Gregor Hams, MD  sulfamethoxazole-trimethoprim (BACTRIM DS,SEPTRA DS) 800-160 MG tablet Taking 3 times a wk 01/06/17  Yes Historical Provider, MD    No Known Allergies  Social History  Substance Use Topics  . Smoking status: Never Smoker  . Smokeless tobacco: Never Used  . Alcohol use Yes  Comment: occasional    Family History  Problem Relation Age of Onset  . Heart attack      grandmother  . Hyperlipidemia Father   . Thyroid disease Father     hashimoto's thyroidistis  . Heart disease Paternal Grandfather     Review of Systems  Constitutional: negative for fevers, chills and anorexia Gastrointestinal: negative for nausea; some loose stool, taking immodium Integument/breast: negative for rash All other systems reviewed and are negative    Constitutional: in no apparent distress  Vitals:   02/20/17 0859  BP: 116/77  Pulse: 64  Temp: 97.8 F (36.6 C)   EYES: anicteric ENMT: Cardiovascular: Cor RRR Respiratory: CTA B;  normal respiratory effort GI: Bowel sounds are normal, liver is not enlarged, spleen is not enlarged, soft, nt Musculoskeletal: no pedal edema noted Skin: negatives: no rash Hematologic: no cervical lad  Labs: Lab Results  Component Value Date   WBC 6.0 02/03/2017   HGB 16.1 02/03/2017   HCT 49.0 06/20/2016   MCV 92.1 06/20/2016   PLT 242 02/03/2017    Lab Results  Component Value Date   CREATININE 0.8 02/03/2017   BUN 19 02/03/2017   NA 143 02/03/2017   K 3.1 (A) 02/03/2017   CL 95 02/03/2017   CO2 25 02/03/2017    Lab Results  Component Value Date   ALT 16 (L) 05/01/2016   AST 16 05/01/2016   ALKPHOS 46 05/01/2016   BILITOT 1.1 05/01/2016     Assessment: acute osteomyelitis.  On appropriate treatment and ESR reported as 3.    Plan: 1) 6 weeks of IV ceftriaxone through May 22 2) ESR, CRP next lab draw

## 2017-02-20 NOTE — Telephone Encounter (Addendum)
Verbal order given to [facility] pharmacist [phone] to obtain a crp and sed rate with next blood draw. Also to pull picc line at the end of IV antibiotics on 03/12/17 per Dr. Luciana Axeomer.

## 2017-02-21 ENCOUNTER — Encounter: Payer: Self-pay | Admitting: Family Medicine

## 2017-02-22 DIAGNOSIS — M317 Microscopic polyangiitis: Secondary | ICD-10-CM | POA: Diagnosis not present

## 2017-02-22 DIAGNOSIS — M869 Osteomyelitis, unspecified: Secondary | ICD-10-CM | POA: Diagnosis not present

## 2017-02-24 DIAGNOSIS — M869 Osteomyelitis, unspecified: Secondary | ICD-10-CM | POA: Diagnosis not present

## 2017-02-24 DIAGNOSIS — M317 Microscopic polyangiitis: Secondary | ICD-10-CM | POA: Diagnosis not present

## 2017-02-25 ENCOUNTER — Encounter: Payer: Self-pay | Admitting: Family Medicine

## 2017-03-01 DIAGNOSIS — M869 Osteomyelitis, unspecified: Secondary | ICD-10-CM | POA: Diagnosis not present

## 2017-03-01 DIAGNOSIS — M317 Microscopic polyangiitis: Secondary | ICD-10-CM | POA: Diagnosis not present

## 2017-03-03 ENCOUNTER — Encounter: Payer: Self-pay | Admitting: Internal Medicine

## 2017-03-03 DIAGNOSIS — M317 Microscopic polyangiitis: Secondary | ICD-10-CM | POA: Diagnosis not present

## 2017-03-03 DIAGNOSIS — M869 Osteomyelitis, unspecified: Secondary | ICD-10-CM | POA: Diagnosis not present

## 2017-03-03 LAB — CBC AND DIFFERENTIAL
HCT: 48 % (ref 41–53)
HEMOGLOBIN: 16.6 g/dL (ref 13.5–17.5)
Neutrophils Absolute: 2 /uL
Platelets: 232 10*3/uL (ref 150–399)
WBC: 3.5 10*3/mL

## 2017-03-03 LAB — BASIC METABOLIC PANEL
BUN: 16 mg/dL (ref 4–21)
CREATININE: 0.7 mg/dL (ref 0.6–1.3)
GLUCOSE: 80 mg/dL
Potassium: 3.1 mmol/L — AB (ref 3.4–5.3)
Sodium: 145 mmol/L (ref 137–147)

## 2017-03-06 ENCOUNTER — Ambulatory Visit (INDEPENDENT_AMBULATORY_CARE_PROVIDER_SITE_OTHER): Payer: 59 | Admitting: Internal Medicine

## 2017-03-06 ENCOUNTER — Encounter: Payer: Self-pay | Admitting: Internal Medicine

## 2017-03-06 DIAGNOSIS — Z452 Encounter for adjustment and management of vascular access device: Secondary | ICD-10-CM

## 2017-03-06 DIAGNOSIS — M8610 Other acute osteomyelitis, unspecified site: Secondary | ICD-10-CM | POA: Diagnosis not present

## 2017-03-06 NOTE — Assessment & Plan Note (Signed)
Doing well and seems to be healing.  Will continue for the 6 weeks and have antibiotics stopped.  He will return if he develops new issues, particularly new pain, swelling or erythema of site.

## 2017-03-06 NOTE — Progress Notes (Signed)
   Subjective:    Patient ID: Jesse Wong, male    DOB: 1981/04/27, 36 y.o.   MRN: [id number]  HPI Here for follow up of acute osteomyelitis. He has been on ceftriaxone and doing well.  Lab good with normal CRP and ESR.  No new issues with site including no swelling or erythema.  No issues with picc line.  No associated diarrhea, rash.    Review of Systems  Constitutional: Negative for chills and fever.  Gastrointestinal: Negative for diarrhea.  Skin: Negative for rash.  Neurological: Negative for dizziness.       Objective:   Physical Exam  Constitutional: He appears well-developed and well-nourished. No distress.  Eyes: No scleral icterus.  Musculoskeletal: He exhibits no tenderness.  Skin: No rash noted.          Assessment & Plan:

## 2017-03-06 NOTE — Assessment & Plan Note (Signed)
Can remove picc line at end of treatment on May 25th

## 2017-03-08 DIAGNOSIS — M317 Microscopic polyangiitis: Secondary | ICD-10-CM | POA: Diagnosis not present

## 2017-03-08 DIAGNOSIS — M869 Osteomyelitis, unspecified: Secondary | ICD-10-CM | POA: Diagnosis not present

## 2017-03-10 DIAGNOSIS — M869 Osteomyelitis, unspecified: Secondary | ICD-10-CM | POA: Diagnosis not present

## 2017-03-10 DIAGNOSIS — M317 Microscopic polyangiitis: Secondary | ICD-10-CM | POA: Diagnosis not present

## 2017-03-12 DIAGNOSIS — M317 Microscopic polyangiitis: Secondary | ICD-10-CM | POA: Diagnosis not present

## 2017-03-12 DIAGNOSIS — M869 Osteomyelitis, unspecified: Secondary | ICD-10-CM | POA: Diagnosis not present

## 2017-03-14 ENCOUNTER — Encounter: Payer: Self-pay | Admitting: Family Medicine

## 2017-03-25 ENCOUNTER — Other Ambulatory Visit: Payer: Self-pay | Admitting: Family Medicine

## 2017-04-10 MED FILL — FLUTICASONE PROP 50 MCG SPR: 50 | 90 days supply | Qty: 48 | Fill #0

## 2017-04-10 MED FILL — SULFAMETHOXAZOLE/TMP DS TAB: 800-160 | 21 days supply | Qty: 9 | Fill #4

## 2017-04-29 DIAGNOSIS — H527 Unspecified disorder of refraction: Secondary | ICD-10-CM | POA: Diagnosis not present

## 2017-05-12 MED FILL — SULFAMETHOXAZOLE/TMP DS TAB: 800-160 | 35 days supply | Qty: 15 | Fill #0

## 2017-06-03 DIAGNOSIS — Z6820 Body mass index (BMI) 20.0-20.9, adult: Secondary | ICD-10-CM | POA: Diagnosis not present

## 2017-06-03 DIAGNOSIS — Z7952 Long term (current) use of systemic steroids: Secondary | ICD-10-CM | POA: Diagnosis not present

## 2017-06-03 DIAGNOSIS — M317 Microscopic polyangiitis: Secondary | ICD-10-CM | POA: Diagnosis not present

## 2017-06-03 DIAGNOSIS — L4059 Other psoriatic arthropathy: Secondary | ICD-10-CM | POA: Diagnosis not present

## 2017-06-11 ENCOUNTER — Other Ambulatory Visit: Payer: Self-pay | Admitting: Family Medicine

## 2017-06-11 MED FILL — SULFAMETHOXAZOLE/TMP DS TAB: 800-160 | 35 days supply | Qty: 15 | Fill #1

## 2017-06-11 MED FILL — POTASSIUM CL ER 20 MEQ TAB: 20 | 30 days supply | Qty: 60 | Fill #0

## 2017-06-27 ENCOUNTER — Ambulatory Visit (HOSPITAL_COMMUNITY)
Admission: RE | Admit: 2017-06-27 | Discharge: 2017-06-27 | Disposition: A | Discharge status: Home / Self Care | Payer: 59 | Source: Ambulatory Visit | Attending: Internal Medicine | Admitting: Internal Medicine

## 2017-06-27 ENCOUNTER — Encounter (HOSPITAL_COMMUNITY): Payer: Self-pay

## 2017-06-27 DIAGNOSIS — M317 Microscopic polyangiitis: Secondary | ICD-10-CM | POA: Insufficient documentation

## 2017-06-27 LAB — CBC WITH DIFFERENTIAL/PLATELET
BASOS PCT: 1 %
Basophils Absolute: 0 10*3/uL (ref 0.0–0.1)
EOS ABS: 0.4 10*3/uL (ref 0.0–0.7)
Eosinophils Relative: 10 %
HCT: 44.2 % (ref 39.0–52.0)
HEMOGLOBIN: 15.7 g/dL (ref 13.0–17.0)
LYMPHS ABS: 1.3 10*3/uL (ref 0.7–4.0)
Lymphocytes Relative: 28 %
MCH: 31.5 pg (ref 26.0–34.0)
MCHC: 35.5 g/dL (ref 30.0–36.0)
MCV: 88.8 fL (ref 78.0–100.0)
Monocytes Absolute: 0.4 10*3/uL (ref 0.1–1.0)
Monocytes Relative: 8 %
NEUTROS PCT: 53 %
Neutro Abs: 2.4 10*3/uL (ref 1.7–7.7)
Platelets: 219 10*3/uL (ref 150–400)
RBC: 4.98 MIL/uL (ref 4.22–5.81)
RDW: 12.6 % (ref 11.5–15.5)
WBC: 4.4 10*3/uL (ref 4.0–10.5)

## 2017-06-27 LAB — COMPREHENSIVE METABOLIC PANEL WITH GFR
ALT: 20 U/L (ref 17–63)
AST: 20 U/L (ref 15–41)
Albumin: 4.2 g/dL (ref 3.5–5.0)
Alkaline Phosphatase: 54 U/L (ref 38–126)
Anion gap: 8 (ref 5–15)
BUN: 16 mg/dL (ref 6–20)
CO2: 26 mmol/L (ref 22–32)
Calcium: 9 mg/dL (ref 8.9–10.3)
Chloride: 104 mmol/L (ref 101–111)
Creatinine, Ser: 0.96 mg/dL (ref 0.61–1.24)
GFR calc Af Amer: >60 mL/min
GFR calc non Af Amer: >60 mL/min
Glucose, Bld: 112 mg/dL — ABNORMAL HIGH (ref 65–99)
Potassium: 3.3 mmol/L — ABNORMAL LOW (ref 3.5–5.1)
Sodium: 138 mmol/L (ref 135–145)
Total Bilirubin: 1.1 mg/dL (ref 0.3–1.2)
Total Protein: 6.8 g/dL (ref 6.5–8.1)

## 2017-06-27 MED ORDER — METHYLPREDNISOLONE SODIUM SUCC 125 MG IJ SOLR
100.0000 mg | Freq: Once | INTRAMUSCULAR | Status: AC
  Administered 2017-06-27: 100 mg via INTRAVENOUS
  Filled 2017-06-27: qty 2

## 2017-06-27 MED ORDER — SODIUM CHLORIDE 0.9 % IV SOLN
Freq: Once | INTRAVENOUS | Status: AC
  Administered 2017-06-27: 08:00:00 via INTRAVENOUS

## 2017-06-27 MED ORDER — SODIUM CHLORIDE 0.9 % IV SOLN
500.0000 mg | Freq: Once | INTRAVENOUS | Status: AC
  Administered 2017-06-27: 500 mg via INTRAVENOUS
  Filled 2017-06-27: qty 50

## 2017-06-27 NOTE — Progress Notes (Signed)
Pt states he took the Tylenol and Zyrtec at 0645 today as specified to take on his order sheet.

## 2017-06-27 NOTE — Progress Notes (Signed)
All labs are normal. 

## 2017-07-23 MED FILL — SULFAMETHOXAZOLE-TMP DS TAB: 800-160 | 35 days supply | Qty: 15 | Fill #2

## 2017-09-01 MED FILL — SULFAMETHOXAZOLE-TMP DS TAB: 800-160 | 35 days supply | Qty: 15 | Fill #0

## 2017-09-08 ENCOUNTER — Other Ambulatory Visit: Payer: Self-pay | Admitting: Family Medicine

## 2017-09-08 ENCOUNTER — Other Ambulatory Visit: Payer: Self-pay | Admitting: Internal Medicine

## 2017-09-08 MED FILL — DULERA 200 MCG/5 MCG INH: 200-5 | 30 days supply | Qty: 13 | Fill #0

## 2017-09-08 MED FILL — BENZONATATE 200 MG CAP: 200 | 10 days supply | Qty: 30 | Fill #0 | Status: TO

## 2017-09-19 MED FILL — FLUTICASONE PROP 50 MCG SPR: 50 | 30 days supply | Qty: 16 | Fill #0

## 2017-10-10 MED FILL — SULFAMETHOXAZOLE-TMP DS TAB: 800-160 | 35 days supply | Qty: 15 | Fill #1

## 2017-10-31 MED FILL — FLUTICASONE PROP 50 MCG SPR: 50 | 90 days supply | Qty: 48 | Fill #1

## 2017-11-13 ENCOUNTER — Telehealth: Payer: 59 | Admitting: Family

## 2017-11-13 DIAGNOSIS — J111 Influenza due to unidentified influenza virus with other respiratory manifestations: Secondary | ICD-10-CM | POA: Diagnosis not present

## 2017-11-13 MED ORDER — OSELTAMIVIR PHOSPHATE 75 MG PO CAPS: 75.0000 mg | ORAL_CAPSULE | Freq: Two times a day (BID) | ORAL | 0 refills | Status: DC

## 2017-11-13 MED FILL — OSELTAMIVIR PHOSPHATE 75 MG: 75 | 5 days supply | Qty: 10 | Fill #0

## 2017-11-13 NOTE — Progress Notes (Signed)
Thank you for the details you included in the comment boxes. Those details are very helpful in determining the best course of treatment for you and help us to provide the best care.  E visit for Flu like symptoms   We are sorry that you are not feeling well.  Here is how we plan to help! Based on what you have shared with me it looks like you may have a respiratory virus that may be influenza.  Influenza or "the flu" is   an infection caused by a respiratory virus. The flu virus is highly contagious and persons who did not receive their yearly flu vaccination may "catch" the flu from close contact.  We have anti-viral medications to treat the viruses that cause this infection. They are not a "cure" and only shorten the course of the infection. These prescriptions are most effective when they are given within the first 2 days of "flu" symptoms. Antiviral medication are indicated if you have a high risk of complications from the flu. You should  also consider an antiviral medication if you are in close contact with someone who is at risk. These medications can help patients avoid complications from the flu  but have side effects that you should know. Possible side effects from Tamiflu or oseltamivir include nausea, vomiting, diarrhea, dizziness, headaches, eye redness, sleep problems or other respiratory symptoms. You should not take Tamiflu if you have an allergy to oseltamivir or any to the ingredients in Tamiflu.  Based upon your symptoms and potential risk factors I have prescribed Oseltamivir (Tamiflu).  It has been sent to your designated pharmacy.  You will take one 75 mg capsule orally twice a day for the next 5 days.  ANYONE WHO HAS FLU SYMPTOMS SHOULD: . Stay home. The flu is highly contagious and going out or to work exposes others! . Be sure to drink plenty of fluids. Water is fine as well as fruit juices, sodas and electrolyte beverages. You may want to stay away from caffeine or alcohol.  If you are nauseated, try taking small sips of liquids. How do you know if you are getting enough fluid? Your urine should be a pale yellow or almost colorless. . Get rest. . Taking a steamy shower or using a humidifier may help nasal congestion and ease sore throat pain. Using a saline nasal spray works much the same way. . Cough drops, hard candies and sore throat lozenges may ease your cough. . Line up a caregiver. Have someone check on you regularly.   GET HELP RIGHT AWAY IF: . You cannot keep down liquids or your medications. . You become short of breath . Your fell like you are going to pass out or loose consciousness. . Your symptoms persist after you have completed your treatment plan MAKE SURE YOU   Understand these instructions.  Will watch your condition.  Will get help right away if you are not doing well or get worse.  Your e-visit answers were reviewed by a board certified advanced clinical practitioner to complete your personal care plan.  Depending on the condition, your plan could have included both over the counter or prescription medications.  If there is a problem please reply  once you have received a response from your provider.  Your safety is important to us.  If you have drug allergies check your prescription carefully.    You can use MyChart to ask questions about today's visit, request a non-urgent call back, or ask for   a work or school excuse for 24 hours related to this e-Visit. If it has been greater than 24 hours you will need to follow up with your provider, or enter a new e-Visit to address those concerns.  You will get an e-mail in the next two days asking about your experience.  I hope that your e-visit has been valuable and will speed your recovery. Thank you for using e-visits.   

## 2017-11-17 MED FILL — SULFAMETHOXAZOLE-TMP DS TAB: 800-160 | 35 days supply | Qty: 15 | Fill #2

## 2017-11-18 MED FILL — BENZONATATE 200 MG CAP: 200 | 10 days supply | Qty: 30 | Fill #1 | Status: TO

## 2017-11-18 MED FILL — DULERA 200 MCG/5 MCG INH: 200-5 | 30 days supply | Qty: 13 | Fill #1

## 2017-12-05 ENCOUNTER — Encounter: Payer: Self-pay | Admitting: Internal Medicine

## 2017-12-05 ENCOUNTER — Ambulatory Visit (INDEPENDENT_AMBULATORY_CARE_PROVIDER_SITE_OTHER)
Admission: RE | Admit: 2017-12-05 | Discharge: 2017-12-05 | Disposition: A | Discharge status: Home / Self Care | Payer: 59 | Source: Ambulatory Visit | Attending: Internal Medicine | Admitting: Internal Medicine

## 2017-12-05 ENCOUNTER — Ambulatory Visit: Payer: 59 | Admitting: Internal Medicine

## 2017-12-05 VITALS — Ht 79.0 in | Wt 173.2 lb

## 2017-12-05 DIAGNOSIS — M3130 Wegener's granulomatosis without renal involvement: Secondary | ICD-10-CM

## 2017-12-05 DIAGNOSIS — J320 Chronic maxillary sinusitis: Secondary | ICD-10-CM | POA: Diagnosis not present

## 2017-12-05 DIAGNOSIS — R05 Cough: Secondary | ICD-10-CM | POA: Diagnosis not present

## 2017-12-05 MED ORDER — FLUTICASONE PROPIONATE 93 MCG/ACT NA EXHU: 2.0000 | INHALANT_SUSPENSION | Freq: Two times a day (BID) | NASAL | 12 refills | Status: DC

## 2017-12-05 NOTE — Progress Notes (Signed)
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 Presented 2011 with nodular infiltrates and progressive weakness/neuropathy. Bronchoscoped 2011- Neg.  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 FVC 4.91/73%, FEV1 4.19/83%, FEV1/FVC 0.85, FEF 25-75% 0.85, TLC 78%, DLCO 113% PFT 06/08/2014-normal lung volumes, minimal slowing and small airway flows with response to bronchodilator, normal diffusion CXR 08/30/2014-Emphysematous and bronchitic changes consistent with COPD. 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 Presented 2011 with nodular infiltrates and progressive weakness/neuropathy. 1 year follow up for asthma. Pt states his breathing has been fine and he is not using any breathing medications. He has needed 6 rounds of Rituxin/ Solu-Medrol with next dose upcoming. Currently off prednisone but at times as needed 15 mg daily. Trying to be sparing with medications, he is using Dulera 101 or 2 puffs occasionally, instead of a rescue inhaler. Still has a nebulizer machine. Consistently using Flonase for perennial rhinitis. 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 Presented 2011 with nodular infiltrates and progressive weakness/neuropathy. ----Wegeners: Pt states he uses Albuterol HFA as needed. States since getting over a cold/ ? flu he is feeling back to base line.  Dulera 200, Albuterol HFA, nebulizer Uses Dulera and rescue inhaler if needed, typically with viral triggered respiratory exacerbations.  Flulike illness last month now resolved.  He flies frequently and eustachian dysfunction can be a problem.  Daily Flonase provides marginal control-always nasal stuffiness.  Mild persistent dry cough.  Benzonatate helps when needed.  Denies shortness of breath.  ROS-see HPI + = positive Constitutional:   No-   weight loss, night sweats, fevers, chills, fatigue, lassitude. HEENT:   No-  headaches, difficulty swallowing, tooth/dental problems, sore throat,       No-  sneezing, itching, +ear ache,+ nasal congestion, post nasal drip,  CV:  No-   chest pain, orthopnea, PND, swelling in lower extremities, anasarca,                                                     dizziness, palpitations Resp: shortness of breath with exertion or at rest.              No-   productive cough,  + non-productive cough,  No- coughing up of blood.              No-   change in color of mucus.  + wheezing.   Skin: No-   rash or lesions. GI:  No-   heartburn, indigestion, abdominal pain, nausea, vomiting,  GU:  MS:  No-   joint pain or swelling.   Neuro-     nothing unusual Psych:  No- change in mood or affect. No depression or anxiety.  No memory loss.  OBJ- Physical Exam General- Alert, Oriented, Affect-appropriate, Distress- none acute, tall, thin, + looks well today Skin- no rash Lymphadenopathy- none Head-  atraumatic            Eyes- Gross vision intact, PERRLA, conjunctivae and secretions clear            Ears- Hearing, canals-normal            Nose- + marked turbinate edema, no-Septal dev, mucus, polyps,  erosion, perforation             Throat- Mallampati II , mucosa clear , drainage- none, tonsils- atrophic Neck- flexible , trachea midline, no stridor , thyroid nl, carotid no bruit Chest - symmetrical excursion , unlabored           Heart/CV- RRR , no murmur , no gallop  , no rub, nl s1 s2                           - JVD- none , edema- none, stasis changes- none, varices- none           Lung-  wheeze-none, cough-none , dullness-none, rub- none, + mild coarseness right base           Chest wall-  Abd-  Br/ Gen/ Rectal- Not done, not indicated Extrem- cyanosis- none, clubbing, none, atrophy- none, strength- nl Neuro- grossly intact to observation

## 2017-12-05 NOTE — Assessment & Plan Note (Signed)
He continues Rituxan  every 6 months and will be following up with Dr. Dierdre ForthBeekman soon.  Mild persistent dry cough consistent with his chronic bronchitic pattern. Plan-update CXR

## 2017-12-05 NOTE — Assessment & Plan Note (Signed)
Flonase helps some but not enough, especially when eustachian dysfunction makes his frequent airplane flights uncomfortable. Plan- Xhance nasal fluticasone device

## 2017-12-05 NOTE — Patient Instructions (Addendum)
Order- CXR    Dx  Granulomatous Vasculitis/   Wegener's  Script for Xhance nasal fluticasone device      Use 1-2 sprays, twice daily    Please call as needed

## 2017-12-09 DIAGNOSIS — L4059 Other psoriatic arthropathy: Secondary | ICD-10-CM | POA: Diagnosis not present

## 2017-12-09 DIAGNOSIS — Z6820 Body mass index (BMI) 20.0-20.9, adult: Secondary | ICD-10-CM | POA: Diagnosis not present

## 2017-12-09 DIAGNOSIS — M317 Microscopic polyangiitis: Secondary | ICD-10-CM | POA: Diagnosis not present

## 2017-12-09 DIAGNOSIS — Z7952 Long term (current) use of systemic steroids: Secondary | ICD-10-CM | POA: Diagnosis not present

## 2017-12-15 ENCOUNTER — Telehealth: Payer: Self-pay | Admitting: Family Medicine

## 2017-12-15 ENCOUNTER — Encounter: Payer: Self-pay | Admitting: Family Medicine

## 2017-12-15 ENCOUNTER — Ambulatory Visit (INDEPENDENT_AMBULATORY_CARE_PROVIDER_SITE_OTHER): Payer: 59 | Admitting: Family Medicine

## 2017-12-15 VITALS — BP 116/70 | HR 75 | Temp 98.6°F | Ht 79.0 in | Wt 172.0 lb

## 2017-12-15 DIAGNOSIS — A689 Relapsing fever, unspecified: Secondary | ICD-10-CM

## 2017-12-15 DIAGNOSIS — M317 Microscopic polyangiitis: Secondary | ICD-10-CM | POA: Diagnosis not present

## 2017-12-15 DIAGNOSIS — M25561 Pain in right knee: Secondary | ICD-10-CM | POA: Diagnosis not present

## 2017-12-15 DIAGNOSIS — R5383 Other fatigue: Secondary | ICD-10-CM

## 2017-12-15 NOTE — Telephone Encounter (Signed)
My chart note sent for labs.

## 2017-12-15 NOTE — Progress Notes (Signed)
Subjective:    Patient ID: Jesse Wong, male    DOB: 05-05-1981, 36 y.o.   MRN: [id number]  HPI 37 year old male with a history of Wegener's granulomatosis or microscopic polyangiitis who comes in today complaining of daily recurrent fevers since for about a week.  He says he will wake up in the morning feeling well and then by the mid afternoon between 2 and 3:00 PM he will run a low-grade temperature.  Temp usually is between 100-101.  He usually takes 800 mg of ibuprofen.  The fever doesn't return until the next afternoon.   No cough, no HA or congestion.  He denies any abdominal pain he does note that he has had more pain in his right no recent travel outside of the country though he recently got back from [place] from a conference.    He has noticed that he has had more pain in his right upper tibia just below the knee.  He unfortunately developed acute osteomyelitis after a puncture wound from a rooster.  This occurred last spring but more recently he has been having more discomfort in that knee.  That he does periodically experience pain in that knee injuries from when he heavily played the drums.  He has not noticed any specific swelling over the knee but just notes that it been more painful recently.  Of note, approximately 1 month ago visit for flu like symptoms.  At that time he was experiencing fever and productive cough though he always has a chronic cough from his Wegener's granulomatosis but it was worse at that time.  Prescription for Tamiflu was sent to the pharmacy but he really only took it for about 3 or 4 days before he got extremely nauseated and actually vomited so he discontinued the medication.  He was then seen pulmonology about 2 weeks later on February 15.  He uses Lawyer for cough and has been using those more frequently.  Review of Systems  BP 116/70   Pulse 75   Temp 98.6 F (37 C) (Temporal)   Ht 6\' 7"  (2.007 m)   Wt 172 lb (78 kg)   SpO2  100%   BMI 19.38 kg/m     No Known Allergies  Past Medical History:  Diagnosis Date  . Allergy   . Arthritis   . Asthma   . Chronic prostatitis   . Myalgia   . Pulmonary infiltrates   . Sinusitis, chronic   . Tonsillar abscess   . Vasculitis [facility])     Past Surgical History:  Procedure Laterality Date  . BRONCHOSCOPY  2011   TBBX ----inflammation  . IR FLUORO GUIDE CV LINE RIGHT  01/31/2017  . IR FLUORO GUIDE CV MIDLINE PICC RIGHT  01/30/2017  . IR US GUIDE VASC ACCESS RIGHT  01/30/2017  . peritonsilar abscess    . TYMPANOPLASTY      Social History   Socioeconomic History  . Marital status: Married    Spouse name: Not on file  . Number of children: 1  . Years of education: Not on file  . Highest education level: Not on file  Social Needs  . Financial resource strain: Not on file  . Food insecurity - worry: Not on file  . Food insecurity - inability: Not on file  . Transportation needs - medical: Not on file  . Transportation needs - non-medical: Not on file  Occupational History  . Occupation: Engineer, manufacturing systems: [employer]  Tobacco Use  . Smoking status: Never Smoker  . Smokeless tobacco: Never Used  Substance and Sexual Activity  . Alcohol use: Yes    Comment: occasional  . Drug use: No  . 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  Other Topics Concern  . Not on file  Social History Narrative  . Not on file    Family History  Problem Relation Age of Onset  . Heart attack Unknown        grandmother  . Hyperlipidemia Father   . Thyroid disease Father        hashimoto's thyroidistis  . Heart disease Paternal Grandfather     Outpatient Encounter Medications as of 12/15/2017  Medication Sig  . albuterol (VENTOLIN HFA) 108 (90 Base) MCG/ACT inhaler Inhale 2 puffs into the lungs every 6 (six) hours as needed for wheezing or shortness of breath.  . benzonatate (TESSALON) 200 MG  capsule TAKE 1 CAPSULE BY MOUTH 3 TIMES DAILY AS NEEDED FOR COUGH  . cholecalciferol (VITAMIN D) 1000 UNITS tablet Take 1 tablet (1,000 Units total) by mouth daily.  . DULERA 200-5 MCG/ACT AERO INHALE 2 PUFFS INTO THE LUNGS 2 TIMES DAILY.  [name] Fluticasone Propionate (XHANCE) 93 MCG/ACT EXHU Place 2 sprays into the nose 2 (two) times daily.  . Nebulizers (COMPRESSOR/NEBULIZER) MISC Use as direccted  . [DISCONTINUED] fluticasone (FLONASE) 50 MCG/ACT nasal spray INSTILL 2 SPRAYS INTO EACH NOSTRIL DAILY.   No facility-administered encounter medications on file as of 12/15/2017.          Objective:   Physical Exam  Constitutional: He is oriented to person, place, and time. He appears well-developed and well-nourished.  HENT:  Head: Normocephalic and atraumatic.  Right Ear: External ear normal.  Left Ear: External ear normal.  Nose: Nose normal.  Mouth/Throat: Oropharynx is clear and moist.  TMs and canals are clear.   Eyes: Conjunctivae and EOM are normal. Pupils are equal, round, and reactive to light.  Neck: Neck supple. No thyromegaly present.  Cardiovascular: Normal rate and normal heart sounds.  Radial pulse 2+ bilaterally.   Pulmonary/Chest: Effort normal and breath sounds normal.  Slight mild expiratory wheeze at the Right lower lung   Abdominal: Soft. Bowel sounds are normal. He exhibits no distension and no mass. There is no tenderness. There is no rebound and no guarding.  Musculoskeletal: He exhibits no edema.  Right knee with no swelling on tenderness. Normal flexion and extension.  Tender over the old wound site on the upper anterior tibia.  Lymphadenopathy:    He has no cervical adenopathy.  Neurological: He is alert and oriented to person, place, and time.  Skin: Skin is warm and dry. No rash noted.  Psychiatric: He has a normal mood and affect. His behavior is normal. Thought content normal.          Assessment & Plan:  Fever of unknown origin.  He really is not  having any additional upper respiratory or abdominal type symptoms at this point in time.  Consider that this could be viral particularly CMV or EBV.  Unclear if he may or may not have actually had influenza 4 weeks ago.  It is not currently experiencing respiratory symptoms and just had a chest x-ray about 4 weeks ago and hesitant to repeat a chest x-ray.  Though he did have some slight expiratory wheeze at the base on the right.  We will also check sed  rate and CRP.  Certainly this could be related to his vasculitis as well.  We will also do a urinalysis just to rule out any renal manifestations.  Consider other causes such as endocarditis.  Consider echocardiogram if workup continues to be unrevealing.  I am concerned that he is having right knee pain again where he previously experienced some osteomyelitis.  He actually plans to follow back up with sports medicine again to have a look at the knee.  I did examine it but did not note any specific swelling or induration or erythema in the area but it was tender over the old wound.  Consider that there could be a low level recurrence of infection.

## 2017-12-16 ENCOUNTER — Ambulatory Visit: Payer: Self-pay | Admitting: Family Medicine

## 2017-12-16 ENCOUNTER — Other Ambulatory Visit (HOSPITAL_COMMUNITY)
Admission: RE | Admit: 2017-12-16 | Discharge: 2017-12-16 | Disposition: A | Discharge status: Home / Self Care | Payer: 59 | Source: Ambulatory Visit | Attending: Family Medicine | Admitting: Family Medicine

## 2017-12-16 DIAGNOSIS — R5383 Other fatigue: Secondary | ICD-10-CM | POA: Insufficient documentation

## 2017-12-16 DIAGNOSIS — A689 Relapsing fever, unspecified: Secondary | ICD-10-CM | POA: Diagnosis not present

## 2017-12-16 DIAGNOSIS — M317 Microscopic polyangiitis: Secondary | ICD-10-CM | POA: Insufficient documentation

## 2017-12-16 LAB — COMPREHENSIVE METABOLIC PANEL
ALT: 21 U/L (ref 17–63)
ANION GAP: 14 (ref 5–15)
AST: 15 U/L (ref 15–41)
Albumin: 4.3 g/dL (ref 3.5–5.0)
Alkaline Phosphatase: 80 U/L (ref 38–126)
BUN: 13 mg/dL (ref 6–20)
CO2: 26 mmol/L (ref 22–32)
CREATININE: 1 mg/dL (ref 0.61–1.24)
Calcium: 9.3 mg/dL (ref 8.9–10.3)
Chloride: 99 mmol/L — ABNORMAL LOW (ref 101–111)
Glucose, Bld: 93 mg/dL (ref 65–99)
POTASSIUM: 4.1 mmol/L (ref 3.5–5.1)
Sodium: 139 mmol/L (ref 135–145)
Total Bilirubin: 0.7 mg/dL (ref 0.3–1.2)
Total Protein: 8 g/dL (ref 6.5–8.1)

## 2017-12-16 LAB — CBC WITH DIFFERENTIAL/PLATELET
Basophils Absolute: 0 10*3/uL (ref 0.0–0.1)
Basophils Relative: 0 %
EOS ABS: 0.3 10*3/uL (ref 0.0–0.7)
EOS PCT: 5 %
HCT: 45.9 % (ref 39.0–52.0)
Hemoglobin: 16.1 g/dL (ref 13.0–17.0)
LYMPHS ABS: 1.3 10*3/uL (ref 0.7–4.0)
LYMPHS PCT: 22 %
MCH: 31.8 pg (ref 26.0–34.0)
MCHC: 35.1 g/dL (ref 30.0–36.0)
MCV: 90.7 fL (ref 78.0–100.0)
MONO ABS: 0.5 10*3/uL (ref 0.1–1.0)
Monocytes Relative: 8 %
Neutro Abs: 3.9 10*3/uL (ref 1.7–7.7)
Neutrophils Relative %: 65 %
PLATELETS: 326 10*3/uL (ref 150–400)
RBC: 5.06 MIL/uL (ref 4.22–5.81)
RDW: 12.2 % (ref 11.5–15.5)
WBC: 6 10*3/uL (ref 4.0–10.5)

## 2017-12-16 LAB — URINALYSIS, ROUTINE W REFLEX MICROSCOPIC
Bilirubin Urine: NEGATIVE
GLUCOSE, UA: NEGATIVE mg/dL
Hgb urine dipstick: NEGATIVE
KETONES UR: NEGATIVE mg/dL
LEUKOCYTES UA: NEGATIVE
NITRITE: NEGATIVE
PROTEIN: NEGATIVE mg/dL
Specific Gravity, Urine: 1.02 (ref 1.005–1.030)
pH: 5 (ref 5.0–8.0)

## 2017-12-16 LAB — C-REACTIVE PROTEIN: CRP: 3.6 mg/dL — AB (ref <1.0)

## 2017-12-16 LAB — SEDIMENTATION RATE: SED RATE: 15 mm/h (ref 0–16)

## 2017-12-17 ENCOUNTER — Encounter: Payer: Self-pay | Admitting: Family Medicine

## 2017-12-17 ENCOUNTER — Other Ambulatory Visit: Payer: Self-pay | Admitting: *Deleted

## 2017-12-17 ENCOUNTER — Ambulatory Visit (INDEPENDENT_AMBULATORY_CARE_PROVIDER_SITE_OTHER): Payer: 59 | Admitting: Family Medicine

## 2017-12-17 VITALS — BP 126/81 | HR 80 | Ht 79.0 in | Wt 173.0 lb

## 2017-12-17 DIAGNOSIS — M25561 Pain in right knee: Secondary | ICD-10-CM | POA: Diagnosis not present

## 2017-12-17 DIAGNOSIS — R5383 Other fatigue: Secondary | ICD-10-CM | POA: Diagnosis not present

## 2017-12-17 DIAGNOSIS — M317 Microscopic polyangiitis: Secondary | ICD-10-CM | POA: Diagnosis not present

## 2017-12-17 DIAGNOSIS — M869 Osteomyelitis, unspecified: Secondary | ICD-10-CM | POA: Diagnosis not present

## 2017-12-17 DIAGNOSIS — R509 Fever, unspecified: Secondary | ICD-10-CM | POA: Diagnosis not present

## 2017-12-17 DIAGNOSIS — A689 Relapsing fever, unspecified: Secondary | ICD-10-CM | POA: Diagnosis not present

## 2017-12-17 LAB — EPSTEIN-BARR VIRUS VCA ANTIBODY PANEL
EBV EARLY ANTIGEN AB, IGG: 28 U/mL — AB (ref 0.0–8.9)
EBV NA IgG: 54.3 U/mL — ABNORMAL HIGH (ref 0.0–17.9)
EBV VCA IgG: 544 U/mL — ABNORMAL HIGH (ref 0.0–17.9)

## 2017-12-17 LAB — CMV IGM: CMV IgM: <30 AU/mL (ref 0.0–29.9)

## 2017-12-17 LAB — CMV ANTIBODY, IGG (EIA): CMV Ab - IgG: 2.4 U/mL — ABNORMAL HIGH (ref 0.00–0.59)

## 2017-12-17 NOTE — Patient Instructions (Addendum)
Thank you for coming in today. Try the diclofenac solution up to 4x daily.  Work on the eccentric exercises try 2 sets of 15-30 reps daily.  You should hear about MRI shortly.  Let me know if you do not hear anything.  Recheck as needed.  I will send a copy of this note to Dr Dierdre Forth.    Patellar Tendinitis Patellar tendinitis, also called jumper's knee, is inflammation of the patellar tendon. Tendons are cord-like tissues that connect muscles to bones. The patellar tendon connects the bottom of the kneecap (patella) to the top of the shin bone (tibia). Patellar tendinitis causes pain in the front of the knee. The condition happens in the following stages:  Stage 1. In this stage, there is pain only after activity.  Stage 2: In this stage, you have pain during and after activity.  Stage 3: In this stage, you have pain during and after activity that limits your ability to do the activity.  Stage 4: In this stage, the tendon tears and severely limits your activity.  What are the causes? This condition is caused by repeated (repetitive) stress on the tendon. This stress may cause the tendon to stretch, swell, thicken, or tear. What increases the risk? The following factors make you more likely to develop this condition:  Participating in sports that involve running, kicking and jumping, especially on hard surfaces. These include: ? Basketball. ? Volleyball. ? Soccer. ? Track and field.  Having tight thigh muscles.  Having received steroid injections in the tendon.  Having had knee surgery.  Being 38-79 years old.  Having rheumatoid arthritis or diabetes.  Training too hard.  What are the signs or symptoms? The main symptom of this condition is pain in the front of the knee. The pain usually starts slowly then it gradually gets worse. It may become painful to straighten your leg. How is this diagnosed? This condition may be diagnosed based on:  Your symptoms.  A medical  history.  A physical exam. During the physical exam, your health care provider may check for tenderness in your patella, tightness in your thigh muscles, and pain when you straighten your knee.  Imaging tests, including: ? X-rays. These will show the position and condition of your patella. ? MRI. This will show any tears in your tendon. ? Ultrasound. This will show any swelling in your tendon and the thickness of your tendon.  How is this treated? Treatment for this condition depends on the stage of the condition. It may involve:  Avoiding activities that cause pain.  Icing your knee.  Taking an NSAID to reduce pain and swelling.  Doing stretching and strengthening exercises (physical therapy) when pain and swelling improve.  Having sound wave stimulation to promote healing.  Wearing a knee brace. This may be needed if your condition does not improve with treatment.  Using crutches or a walker. This may be needed if your condition does not improve with treatment.  Surgery. This may be done if you have stage 4 tendinitis.  Follow these instructions at home: If You Have a Knee Brace:  Wear it as told by your health care provider. Remove it only as told by your health care provider.  Loosen the brace if your toes become numb and tingle, or if they turn cold and blue.  Do not let your brace get wet if it is not waterproof.  Keep the brace clean.  Ask your health care provider when it is safe for you  to drive. Managing pain, stiffness, and swelling  Take over-the-counter and prescription medicines only as told by your health care provider.  If directed, apply ice to the injured area. ? Put ice in a plastic bag. ? Place a towel between your skin and the bag. ? Leave the ice on for 20 minutes, 2-3 times a day.  Move your toes often to avoid stiffness and to lessen swelling.  Raise (elevate) your knee above the level of your heart while you are sitting or lying  down. Activity  Return to your normal activities as told by your health care provider. Ask your health care provider what activities are safe for you.  Do exercises as told by your health care provider. General instructions  Do not use the injured limb to support your body weight until your health care provider says that you can. Use your crutches or walker as told by your health care provider.  Keep all follow-up visits as told by your health care provider. This is important. How is this prevented?  Warm up and stretch before being active.  Cool down and stretch after being active.  Give your body time to rest between periods of activity.  Make sure to use equipment that fits you.  Be safe and responsible while being active to avoid falls.  Do at least 150 minutes of moderate-intensity exercise each week, such as brisk walking or water aerobics.  Maintain physical fitness, including: ? Strength. ? Flexibility. ? Cardiovascular fitness. ? Endurance. Contact a health care provider if:  Your symptoms have not improve in 6 weeks.  Your symptoms get worse. This information is not intended to replace advice given to you by your health care provider. Make sure you discuss any questions you have with your health care provider. Document Released: 10/07/2005 Document Revised: 06/11/2016 Document Reviewed: 07/11/2015 Elsevier Interactive Patient Education  [employer]2018 [employer]

## 2017-12-17 NOTE — Progress Notes (Signed)
Jesse Wong is a 37 y.o. male who presents to [place] [place] today for right knee pain.   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 He notes that over the past few weeks or so he has been having fevers in the evening.  He was seen by his primary care provider the other day who completed a pretty extensive workup which included blood cultures viral panels and rheumatologic panels.  His only significant abnormality on lab exam was a mildly elevated CRP at 3.6.His sedimentation rate was normal.  He notes that he will be due in the next few weeks for a repeat infusion of rituximab and is worried that he may have an currently undiagnosed infection that would worsen with repeat rituximab.  He returns to sports medicine today because of continued mild right knee pain.  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  The osteomyelitis was successfully treated with IV antibiotics per infectious disease.  He notes pain in the anterior knee present for the last month.  He denies any redness or significant tenderness of the knee and denies any discharge.  He notes mild right knee pain worse when he is sitting in his truck with his knee slightly flexed.  He notes activity does not particularly bother the knee.  He has not had much treatment for this yet recently.   Past Medical History:  Diagnosis Date  . Allergy   . Arthritis   . Asthma   . Chronic prostatitis   . Myalgia   . Pulmonary infiltrates   . Sinusitis, chronic   . Tonsillar abscess   . Vasculitis [facility])    Past Surgical History:  Procedure Laterality Date  . BRONCHOSCOPY  2011   TBBX ----inflammation  . IR FLUORO GUIDE CV LINE RIGHT  01/31/2017  . IR FLUORO  GUIDE CV MIDLINE PICC RIGHT  01/30/2017  . IR US GUIDE VASC ACCESS RIGHT  01/30/2017  . peritonsilar abscess    . TYMPANOPLASTY     Social History   Tobacco Use  . Smoking status: Never Smoker  . Smokeless tobacco: Never Used  Substance Use Topics  . Alcohol use: Yes    Comment: occasional   family history includes Heart attack in his unknown relative; Heart disease in his paternal grandfather; Hyperlipidemia in his father; Thyroid disease in his father.  ROS as above:  Medications: Current Outpatient Medications  Medication Sig Dispense Refill  . albuterol (VENTOLIN HFA) 108 (90 Base) MCG/ACT inhaler Inhale 2 puffs into the lungs every 6 (six) hours as needed for wheezing or shortness of breath.    . benzonatate (TESSALON) 200 MG capsule TAKE 1 CAPSULE BY MOUTH 3 TIMES DAILY AS NEEDED FOR COUGH 30 capsule 5  . cholecalciferol (VITAMIN D) 1000 UNITS tablet Take 1 tablet (1,000 Units total) by mouth daily. 30 tablet 6  . DULERA 200-5 MCG/ACT AERO INHALE 2 PUFFS INTO THE LUNGS 2 TIMES DAILY. 13 g 11  . Fluticasone Propionate (XHANCE) 93 MCG/ACT EXHU Place 2 sprays into the nose 2 (two) times daily. 16 mL 12  . Nebulizers (COMPRESSOR/NEBULIZER) MISC Use as direccted 1 each 0   No current facility-administered medications for this visit.    No Known Allergies  Health Maintenance Health Maintenance  Topic Date Due  . HIV Screening  03/21/1995  . TETANUS/TDAP  01/26/2027  . INFLUENZA VACCINE  Completed     Exam:  BP 126/81   Pulse 80   Ht 6' 7" (2.007 m)   Wt 173 lb (78.5 kg)   BMI 19.49 kg/m  Gen: Well NAD HEENT: EOMI,  MMM Lungs: Normal work of breathing. CTABL Heart: RRR no MRG Abd: NABS, Soft. Nondistended, Nontender Exts: Brisk capillary refill, warm and well perfused.  Right knee well-appearing with no erythema or effusion. Range of motion 0-120 degrees with no crepitations. Nontender overlying the anterior tibia patella tendon and patella.  Nontender overlying  joint lines or elsewhere in the knee. Stable ligamentous exam. Intact flexion and extension strength.   Results for orders placed or performed during the hospital encounter of 12/16/17 (from the past 72 hour(s))  CBC with Differential/Platelet     Status: None   Collection Time: 12/16/17 10:50 AM  Result Value Ref Range   WBC 6.0 4.0 - 10.5 K/uL   RBC 5.06 4.22 - 5.81 MIL/uL   Hemoglobin 16.1 13.0 - 17.0 g/dL   HCT 45.9 39.0 - 52.0 %   MCV 90.7 78.0 - 100.0 fL   MCH 31.8 26.0 - 34.0 pg   MCHC 35.1 30.0 - 36.0 g/dL   RDW 12.2 11.5 - 15.5 %   Platelets 326 150 - 400 K/uL   Neutrophils Relative % 65 %   Neutro Abs 3.9 1.7 - 7.7 K/uL   Lymphocytes Relative 22 %   Lymphs Abs 1.3 0.7 - 4.0 K/uL   Monocytes Relative 8 %   Monocytes Absolute 0.5 0.1 - 1.0 K/uL   Eosinophils Relative 5 %   Eosinophils Absolute 0.3 0.0 - 0.7 K/uL   Basophils Relative 0 %   Basophils Absolute 0.0 0.0 - 0.1 K/uL    Comment: Performed at [facility], [place] [address], [place], [place] [address]  Comprehensive metabolic panel     Status: Abnormal   Collection Time: 12/16/17 10:50 AM  Result Value Ref Range   Sodium 139 135 - 145 mmol/L   Potassium 4.1 3.5 - 5.1 mmol/L   Chloride 99 (L) 101 - 111 mmol/L   CO2 26 22 - 32 mmol/L   Glucose, Bld 93 65 - 99 mg/dL   BUN 13 6 - 20 mg/dL   Creatinine, Ser 1.00 0.61 - 1.24 mg/dL   Calcium 9.3 8.9 - 10.3 mg/dL   Total Protein 8.0 6.5 - 8.1 g/dL   Albumin 4.3 3.5 - 5.0 g/dL   AST 15 15 - 41 U/L   ALT 21 17 - 63 U/L   Alkaline Phosphatase 80 38 - 126 U/L   Total Bilirubin 0.7 0.3 - 1.2 mg/dL   GFR calc non Af Amer >60 >60 mL/min   GFR calc Af Amer >60 >60 mL/min    Comment: (NOTE) The eGFR has been calculated using the CKD EPI equation. This calculation has not been validated in all clinical situations. eGFR's persistently <60 mL/min signify possible Chronic Kidney Disease.    Anion gap 14 5 - 15    Comment: Performed at [facility], [place] [address] [place], [place] [address]  C-reactive protein     Status: Abnormal   Collection Time: 12/16/17 10:50 AM  Result Value Ref Range   CRP 3.6 (H) <1.0 mg/dL    Comment: Performed at [facility] [address] [place], [place] [address]  Sedimentation rate     Status: None  Collection Time: 12/16/17 10:50 AM  Result Value Ref Range   Sed Rate 15 0 - 16 mm/hr    Comment: Performed at [facility], [place] [address], [place], [place] [address]  Urinalysis, Routine w reflex microscopic     Status: None   Collection Time: 12/16/17 10:50 AM  Result Value Ref Range   Color, Urine YELLOW YELLOW   APPearance CLEAR CLEAR   Specific Gravity, Urine 1.020 1.005 - 1.030   pH 5.0 5.0 - 8.0   Glucose, UA NEGATIVE NEGATIVE mg/dL   Hgb urine dipstick NEGATIVE NEGATIVE   Bilirubin Urine NEGATIVE NEGATIVE   Ketones, ur NEGATIVE NEGATIVE mg/dL   Protein, ur NEGATIVE NEGATIVE mg/dL   Nitrite NEGATIVE NEGATIVE   Leukocytes, UA NEGATIVE NEGATIVE    Comment: Performed at [facility] [address], [place], [place] [address]  Cmv antibody, IgG (EIA)     Status: Abnormal   Collection Time: 12/16/17 10:50 AM  Result Value Ref Range   CMV Ab - IgG 2.40 (H) 0.00 - 0.59 U/mL    Comment: (NOTE)                               Negative          <0.60                               Equivocal   0.60 - 0.69                               Positive          >0.69 Performed At: [facility] [place], [place] [id number] Rush Farmer MD [id number] Performed at [facility], [place] [address], [place], [place] [address]   CMV IgM     Status: None   Collection Time: 12/16/17 10:50 AM  Result Value Ref Range   CMV IgM <30.0 0.0 - 29.9 AU/mL    Comment: (NOTE)                                Negative         <30.0                                Equivocal  30.0 - 34.9                                 Positive         >34.9 A positive result is generally indicative of acute infection, reactivation or persistent IgM production. Performed At: [facility] [place], [place] [id number] Rush Farmer MD [id number] Performed at [facility], [place] [address] [place], [place] [address]   Culture, blood (single) w Reflex to ID Panel     Status: None (Preliminary result)   Collection Time: 12/16/17 10:50 AM  Result Value Ref Range   Specimen Description      BLOOD RIGHT ANTECUBITAL Performed at [facility] [address], [place], [place] [address]    Special Requests      BOTTLES DRAWN AEROBIC  AND ANAEROBIC Blood Culture adequate volume Performed at [facility] [address], [place], [place] [address]    Culture      NO GROWTH < 24 HOURS Performed at [facility] [address] [place], [place] [address]    Report Status PENDING    No results found.    Assessment and Plan: 37 y.o. male with  Right knee pain in the setting of history of osteomyelitis 10 months ago with new onset fevers and mildly elevated CRP.  Patient's history is further complicated by immunocompromise status due to rituximab and Wegener's granulomatosis.  I think the knee pain is probably unlikely to be osteomyelitis however osteomyelitis could explain the fevers and pain.  Further provided immunosuppression in the setting of smoldering untreated osteomyelitis is contraindicated.   Plan: Obtain MRI of the knee to evaluate the possibility of osteomyelitis to explain fevers and pain.  I discussed this with radiology who recommends with and without IV contrast.  In the meantime will try treating knee pain for presumptive patellar tendinitis with topical diclofenac solution (he has a bottle at home) and eccentric exercises.  We will update patient with results when available.  Additionally will send a copy of this note to  his rheumatologist Dr. [name] at [facility] rheumatology.   Orders Placed This Encounter  Procedures  . MR KNEE RIGHT W WO CONTRAST    Standing Status:   Future    Standing Expiration Date:   02/15/2019    Order Specific Question:   If indicated for the ordered procedure, I authorize the administration of contrast media per Radiology protocol    Answer:   Yes    Order Specific Question:   What is the patient's sedation requirement?    Answer:   No Sedation    Order Specific Question:   Does the patient have a pacemaker or implanted devices?    Answer:   No    Order Specific Question:   Radiology Contrast Protocol - do NOT remove file path    Answer:   _0 [email or web address]    Order Specific Question:   Reason for Exam additional comments    Answer:   Hx osteomyelitis 10 months ago treated with IV abx. Now reccurent fever and mild pain. Elevated CRP. Hx wegeners on immunosupression. Question recurrent osteomyelitis?    Order Specific Question:   Preferred imaging location?    Answer:   [facility] (table limit-550lbs)   No orders of the defined types were placed in this encounter.    Discussed warning signs or symptoms. Please see discharge instructions. Patient expresses understanding.  CC: Dr [name]

## 2017-12-17 NOTE — Addendum Note (Signed)
Addended by: Deno EtienneBARKLEY, Jossalin Chervenak L on: 12/17/2017 02:02 PM   Modules accepted: Orders

## 2017-12-18 LAB — RESPIRATORY VIRUS PANEL
Adenovirus B: NOT DETECTED
HUMAN PARAINFLU VIRUS 1: NOT DETECTED
HUMAN PARAINFLU VIRUS 2: NOT DETECTED
HUMAN PARAINFLU VIRUS 3: NOT DETECTED
INFLUENZA A SUBTYPE H1: NOT DETECTED
INFLUENZA A SUBTYPE H3: NOT DETECTED
INFLUENZA A: NOT DETECTED
INFLUENZA B 1: NOT DETECTED
Metapneumovirus: NOT DETECTED
RESPIRATORY SYNCYTIAL VIRUS B: NOT DETECTED
RHINOVIRUS: DETECTED — AB
Respiratory Syncytial Virus A: NOT DETECTED

## 2017-12-21 LAB — CULTURE, BLOOD (SINGLE)
Culture: NO GROWTH
Special Requests: ADEQUATE

## 2017-12-22 MED FILL — SULFAMETHOXAZOLE-TMP DS TAB: 800-160 | 35 days supply | Qty: 15 | Fill #0

## 2017-12-23 ENCOUNTER — Ambulatory Visit: Payer: Self-pay | Admitting: Family Medicine

## 2017-12-25 ENCOUNTER — Encounter (HOSPITAL_COMMUNITY): Payer: 59

## 2017-12-25 ENCOUNTER — Encounter: Payer: Self-pay | Admitting: Family Medicine

## 2017-12-25 MED FILL — DULERA 200 MCG/5 MCG INH: 200-5 | 30 days supply | Qty: 13 | Fill #2

## 2017-12-25 MED FILL — predniSONE 10 MG TABS: 10 | 30 days supply | Qty: 120 | Fill #0

## 2017-12-25 MED FILL — BENZONATATE 200 MG CAPS: 200 | 10 days supply | Qty: 30 | Fill #2 | Status: TO

## 2017-12-26 MED ORDER — AMOXICILLIN-POT CLAVULANATE 875-125 MG PO TABS: 1.0000 | ORAL_TABLET | Freq: Two times a day (BID) | ORAL | 0 refills | Status: DC

## 2018-01-06 ENCOUNTER — Ambulatory Visit
Admission: RE | Admit: 2018-01-06 | Discharge: 2018-01-06 | Disposition: A | Discharge status: Home / Self Care | Payer: 59 | Source: Ambulatory Visit | Attending: Family Medicine | Admitting: Family Medicine

## 2018-01-06 DIAGNOSIS — M868X6 Other osteomyelitis, lower leg: Secondary | ICD-10-CM | POA: Diagnosis not present

## 2018-01-06 DIAGNOSIS — M869 Osteomyelitis, unspecified: Secondary | ICD-10-CM

## 2018-01-06 MED ORDER — GADOBENATE DIMEGLUMINE 529 MG/ML IV SOLN
15.0000 mL | Freq: Once | INTRAVENOUS | Status: AC | PRN
  Administered 2018-01-06: 15 mL via INTRAVENOUS

## 2018-01-11 ENCOUNTER — Encounter: Payer: Self-pay | Admitting: Family Medicine

## 2018-01-13 ENCOUNTER — Encounter: Payer: Self-pay | Admitting: Family Medicine

## 2018-01-13 ENCOUNTER — Ambulatory Visit (INDEPENDENT_AMBULATORY_CARE_PROVIDER_SITE_OTHER): Payer: 59

## 2018-01-13 ENCOUNTER — Ambulatory Visit (INDEPENDENT_AMBULATORY_CARE_PROVIDER_SITE_OTHER): Payer: 59 | Admitting: Family Medicine

## 2018-01-13 VITALS — BP 117/67 | HR 73 | Temp 98.0°F | Resp 18 | Ht 79.0 in | Wt 173.0 lb

## 2018-01-13 DIAGNOSIS — R51 Headache: Secondary | ICD-10-CM | POA: Diagnosis not present

## 2018-01-13 DIAGNOSIS — R05 Cough: Secondary | ICD-10-CM

## 2018-01-13 DIAGNOSIS — J329 Chronic sinusitis, unspecified: Secondary | ICD-10-CM

## 2018-01-13 DIAGNOSIS — R509 Fever, unspecified: Secondary | ICD-10-CM | POA: Diagnosis not present

## 2018-01-13 DIAGNOSIS — R059 Cough, unspecified: Secondary | ICD-10-CM

## 2018-01-13 DIAGNOSIS — M317 Microscopic polyangiitis: Secondary | ICD-10-CM

## 2018-01-13 DIAGNOSIS — R519 Headache, unspecified: Secondary | ICD-10-CM

## 2018-01-13 DIAGNOSIS — R911 Solitary pulmonary nodule: Secondary | ICD-10-CM

## 2018-01-13 DIAGNOSIS — R918 Other nonspecific abnormal finding of lung field: Secondary | ICD-10-CM | POA: Diagnosis not present

## 2018-01-13 NOTE — Progress Notes (Signed)
Subjective:    Patient ID: Jesse Wong, male    DOB: 1981/10/04, 37 y.o.   MRN: [id number]  HPI   37 year old male with a history of microscopic polyangiitis comes in today with recurrent fever and sore throat.  He come in initially around February 25.  At that point he was having daily fevers usually in the afternoons but felt great in the mornings.  At the time I felt like this could be caused by sinus infection so we decided to put him on Augmentin.  He did get better and felt better and completed his Augmentin about a week ago.  Then by the following weekend he started to run a low-grade temperature again have a persistently irritated and sore throat as well as postnasal drip.  He feels like his airways and nasal areas are inflamed.  He is also been on 20 mg of prednisone daily since starting the Augmentin.  This been prescribed by Dr. Dierdre ForthBeekman.  Also having some nasal mucus.  He did let me know other couple weeks ago he did clear out the barn was exposed to some old hay and dirt, mild and mildew.   He had some Cipro left over from the rooster incident and started taking that.  He is supposed to have a Rituxan treatment scheduled for [facility] and wants to make sure that he is safe to do so.  He feels like some of his symptoms could be related to the polyangiitis and that the treatment may actually help him feel better. + dry cough.  Fever to 101.5.  No N/V.     Review of Systems  BP 117/67   Pulse 73   Temp 98 F (36.7 C) (Oral)   Resp 18   Ht 6\' 7"  (2.007 m)   Wt 173 lb (78.5 kg)   SpO2 98%   BMI 19.49 kg/m     No Known Allergies  Past Medical History:  Diagnosis Date  . Allergy   . Arthritis   . Asthma   . Chronic prostatitis   . Myalgia   . Pulmonary infiltrates   . Sinusitis, chronic   . Tonsillar abscess   . Vasculitis [facility](HCC)     Past Surgical History:  Procedure Laterality Date  . BRONCHOSCOPY  2011   TBBX ----inflammation  . IR FLUORO GUIDE CV LINE RIGHT   01/31/2017  . IR FLUORO GUIDE CV MIDLINE PICC RIGHT  01/30/2017  . IR US GUIDE VASC ACCESS RIGHT  01/30/2017  . peritonsilar abscess    . TYMPANOPLASTY      Social History   Socioeconomic History  . Marital status: Married    Spouse name: Not on file  . Number of children: 1  . Years of education: Not on file  . Highest education level: Not on file  Occupational History  . Occupation: Engineer, manufacturing systemsuclear Medicine    Employer: [employer]  Social Needs  . Financial resource strain: Not on file  . Food insecurity:    Worry: Not on file    Inability: Not on file  . Transportation needs:    Medical: Not on file    Non-medical: Not on file  Tobacco Use  . Smoking status: Never Smoker  . Smokeless tobacco: Never Used  Substance and Sexual Activity  . Alcohol use: Yes    Comment: occasional  . Drug use: No  . 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  Lifestyle  . Physical activity:    Days per week: Not on file    Minutes per session: Not on file  . Stress: Not on file  Relationships  . Social connections:    Talks on phone: Not on file    Gets together: Not on file    Attends religious service: Not on file    Active member of club or organization: Not on file    Attends meetings of clubs or organizations: Not on file    Relationship status: Not on file  . Intimate partner violence:    Fear of current or ex partner: Not on file    Emotionally abused: Not on file    Physically abused: Not on file    Forced sexual activity: Not on file  Other Topics Concern  . Not on file  Social History Narrative  . Not on file    Family History  Problem Relation Age of Onset  . Heart attack Unknown        grandmother  . Hyperlipidemia Father   . Thyroid disease Father        hashimoto's thyroidistis  . Heart disease Paternal Grandfather     Outpatient Encounter Medications as of 01/13/2018  Medication Sig  . albuterol  (VENTOLIN HFA) 108 (90 Base) MCG/ACT inhaler Inhale 2 puffs into the lungs every 6 (six) hours as needed for wheezing or shortness of breath.  . benzonatate (TESSALON) 200 MG capsule TAKE 1 CAPSULE BY MOUTH 3 TIMES DAILY AS NEEDED FOR COUGH  . cholecalciferol (VITAMIN D) 1000 UNITS tablet Take 1 tablet (1,000 Units total) by mouth daily.  . DULERA 200-5 MCG/ACT AERO INHALE 2 PUFFS INTO THE LUNGS 2 TIMES DAILY.  [name] Fluticasone Propionate (XHANCE) 93 MCG/ACT EXHU Place 2 sprays into the nose 2 (two) times daily.  . Nebulizers (COMPRESSOR/NEBULIZER) MISC Use as direccted  . predniSONE (DELTASONE) 10 MG tablet Take 10 mg by mouth daily.  . [DISCONTINUED] amoxicillin-clavulanate (AUGMENTIN) 875-125 MG tablet Take 1 tablet by mouth 2 (two) times daily.   No facility-administered encounter medications on file as of 01/13/2018.           Objective:   Physical Exam  Constitutional: He is oriented to person, place, and time. He appears well-developed and well-nourished.  HENT:  Head: Normocephalic and atraumatic.  Right Ear: External ear normal.  Left Ear: External ear normal.  Nose: Nose normal.  Mouth/Throat: Oropharynx is clear and moist.  TMs and canals are clear.   Eyes: Pupils are equal, round, and reactive to light. Conjunctivae and EOM are normal.  Neck: Neck supple. No thyromegaly present.  Cardiovascular: Normal rate and normal heart sounds.  Pulmonary/Chest: Effort normal and breath sounds normal.  slight inspiratory wheeze on the anterior chest.  Clear on posterior lung fields.  Lymphadenopathy:    He has no cervical adenopathy.  Neurological: He is alert and oriented to person, place, and time.  Skin: Skin is warm and dry.  Psychiatric: He has a normal mood and affect.        Assessment & Plan:  Fever with sinus symptoms-at this point I think we should get a CT limited sinus without contrast for further evaluation.  I think this can help [place] differentiate whether or not he  could actually have a chronic sinusitis which could be causing the persistent fever in which case he may just need longer treatment for 3 to 4 weeks.  Dr. Dierdre Forth has wanted him to get a chest  CT without contrast we will order that as well and make sure there are no underlying infections.  He was cleaning out a barn with old hay a couple of weeks ago and certainly that raises the suspicion for other types of infectious disease etiologies.  Cough - will get chest CT. continue use of Tessalon Perles as needed.  Microscopic polyangiitis-short of her infusion tomorrow.  Will call with results once available.

## 2018-01-14 ENCOUNTER — Telehealth: Payer: Self-pay | Admitting: *Deleted

## 2018-01-14 ENCOUNTER — Encounter (HOSPITAL_COMMUNITY)
Admission: RE | Admit: 2018-01-14 | Discharge: 2018-01-14 | Disposition: A | Discharge status: Home / Self Care | Payer: 59 | Source: Ambulatory Visit | Attending: Internal Medicine | Admitting: Internal Medicine

## 2018-01-14 NOTE — Telephone Encounter (Signed)
Called pt and he stated that Dr. Mallie MusselBeakman is trying to work on getting him in w/ID today.Jesse Wong.Jesse Wong.Heath GoldBarkley, Philip Kotlyar Lynetta, CMA

## 2018-01-15 ENCOUNTER — Ambulatory Visit: Payer: 59 | Admitting: Internal Medicine

## 2018-01-15 ENCOUNTER — Encounter: Payer: Self-pay | Admitting: Internal Medicine

## 2018-01-15 DIAGNOSIS — R911 Solitary pulmonary nodule: Secondary | ICD-10-CM

## 2018-01-15 NOTE — Progress Notes (Signed)
[facility]      Reason for Consult: pulmonary nodule    Referring Physician: Drs. Donnald Garre    Patient ID: Jesse Wong, male    DOB: January 07, 1981, 37 y.o.   MRN: [id number]  HPI:   He comes in for a new problem.  I previously saw him for acute osteomyelitis and treated successfully with 6 weeks of IV therapy.  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  At the time his CT chest noted perilymphatic nodular opacities and ground-glass opacities concerning most for non-infectious processes like sarcoidosis but, as above, no diagnosis found.  He was having some non-specific symptoms including nasal congestion, cough, myalgias and weakness.  He then was started on Rituxan with intermittent steroids by Dr. Dareen Piano of rheumatology and CT scan in 2014 noted near resolution of previous findings. He continues to follow Dr. Dierdre Forth for rheumatology and has been getting Rituxan about twice a year.  He has continued to follow Dr. Maple Hudson and PFTs have been wnl (2011, 2015), being treated with inhalers.       Over the last 6 months, he has felt more fatigue, cough and fever and recently has been evaluated by his PC, Dr. Linford Arnold and Dr. Dierdre Forth and given a course of cipro, Augmentin and steroids for his symptoms recently.  Further evaluation on CT of his chest did reveal new tree-in-bud opacities of the right middle lobe, lingula and lower lobe with some mild bronchiectasis.  There also is a new nodular opacity of the right lower lobe.  [place] with multiple animals and wild animals around.  Has not had any recent travel out of the country.  Quantiferon Gold negative in December 2017 in Dr. Shawnee Knapp office.   CT independently reviewed and areas of concern small.   Previous record reviewed in Epic including pulmonary notes. Case discussed with Dr. Dierdre Forth.    Past Medical History:    Diagnosis Date  . Allergy   . Arthritis   . Asthma   . Chronic prostatitis   . Myalgia   . Pulmonary infiltrates   . Sinusitis, chronic   . Tonsillar abscess   . Vasculitis (HCC)     Prior to Admission medications   Medication Sig Start Date End Date Taking? Authorizing Provider  albuterol (VENTOLIN HFA) 108 (90 Base) MCG/ACT inhaler Inhale 2 puffs into the lungs every 6 (six) hours as needed for wheezing or shortness of breath.   Yes [provider]  benzonatate (TESSALON) 200 MG capsule TAKE 1 CAPSULE BY MOUTH 3 TIMES DAILY AS NEEDED FOR COUGH 09/08/17  Yes [name], Clinton D, MD  cholecalciferol (VITAMIN D) 1000 UNITS tablet Take 1 tablet (1,000 Units total) by mouth daily. 05/18/14  Yes Agapito Games, MD  DULERA 200-5 MCG/ACT AERO INHALE 2 PUFFS INTO THE LUNGS 2 TIMES DAILY. 09/08/17  Yes [name], Joni Fears D, MD  Fluticasone Propionate Timmothy Sours) 93 MCG/ACT EXHU Place 2 sprays into the nose 2 (two) times daily. 12/05/17  Yes Jetty Duhamel D, MD  Nebulizers (COMPRESSOR/NEBULIZER) MISC Use as direccted 08/30/14  Yes [name], Joni Fears D, MD  predniSONE (DELTASONE) 10 MG tablet Take 10 mg by mouth daily. 12/25/17  Yes [provider]    No Known Allergies  Social History   Tobacco Use  . Smoking status: Never Smoker  . Smokeless tobacco: Never Used  Substance Use Topics  . Alcohol use:  Yes    Comment: occasional  . Drug use: No    Family History  Problem Relation Age of Onset  . Heart attack Unknown        grandmother  . Hyperlipidemia Father   . Thyroid disease Father        hashimoto's thyroidistis  . Heart disease Paternal Grandfather     Review of Systems  Constitutional: positive for fevers and chills or negative for weight loss Ears, nose, mouth, throat, and face: positive for nasal congestion and sore throat Respiratory: positive for cough, negative for sputum Gastrointestinal: negative for diarrhea Integument/breast: negative for  rash Musculoskeletal: negative for myalgias and arthralgias All other systems reviewed and are negative    Constitutional: in no apparent distress and alert  Vitals:   01/15/18 0938  BP: 117/77  Pulse: (!) 56  Temp: 97.7 F (36.5 C)   EYES: anicteric ENMT:no thrush Cardiovascular: Cor RRR Respiratory: CTA B; normal respiratory effort GI: Bowel sounds are normal, liver is not enlarged, spleen is not enlarged Musculoskeletal: no pedal edema noted Skin: negatives: no rash Hematologic: no cervical lad  Labs: Lab Results  Component Value Date   WBC 6.0 12/16/2017   HGB 16.1 12/16/2017   HCT 45.9 12/16/2017   MCV 90.7 12/16/2017   PLT 326 12/16/2017    Lab Results  Component Value Date   CREATININE 1.00 12/16/2017   BUN 13 12/16/2017   NA 139 12/16/2017   K 4.1 12/16/2017   CL 99 (L) 12/16/2017   CO2 26 12/16/2017    Lab Results  Component Value Date   ALT 21 12/16/2017   AST 15 12/16/2017   ALKPHOS 80 12/16/2017   BILITOT 0.7 12/16/2017     Assessment: new tree-in-bud areas on nodule on CT scan of his chest concerning for early infection vs other etiologies. With recent fever, chills and prolonged fatigue, malaise, infection possible.  Differential is broad and includes Mycobacterial, fungal, non-ifnectious.    Plan: 1) blood evaluation for fungal, Mycobacteria 2) urine fungal studies 3) sputum evaluation Will discuss with Dr. Maple HudsonYoung as wwell regarding utility of bronchoscopy.  Will follow up depending on clinical course.

## 2018-01-16 ENCOUNTER — Other Ambulatory Visit: Payer: 59

## 2018-01-16 DIAGNOSIS — R911 Solitary pulmonary nodule: Secondary | ICD-10-CM | POA: Diagnosis not present

## 2018-01-17 LAB — QUANTIFERON-TB GOLD PLUS
Mitogen-NIL: 8.18 IU/mL
NIL: 0.01 IU/mL
QUANTIFERON-TB GOLD PLUS: NEGATIVE
TB1-NIL: 0 IU/mL
TB2-NIL: 0 IU/mL

## 2018-01-19 LAB — BLASTOMYCES AB, ID: BLASTOMYCES ABS, QN, DID: NEGATIVE

## 2018-01-19 LAB — HISTOPLASMA ANTIBODIES: Histoplasma Ab, Immunodiffusion: NEGATIVE

## 2018-01-20 ENCOUNTER — Telehealth: Payer: Self-pay | Admitting: Internal Medicine

## 2018-01-20 LAB — LEGIONELLA ANTIGEN, URINE
LEGIONELLA ANTIGEN, URINE: NOT DETECTED
MICRO NUMBER:: 90388177
SPECIMEN QUALITY:: ADEQUATE

## 2018-01-20 MED FILL — BENZONATATE 200 MG CAPS: 200 | 10 days supply | Qty: 30 | Fill #3 | Status: TO

## 2018-01-20 NOTE — Telephone Encounter (Signed)
On 01/19/18 I texted patient to discuss Dr. Henreitta Leber question about workup for possible atypical infection. We discussed bronchoscopy for BAL vs nebulizing hypertonic saline for sputum induction. He told me he had submitted expectorated sputum for culture on 3/29. As of this morning, AFB and Fungal cxs are negative. If cultures remain neg by 4/5, he is agreeable to arranging bronchoscopy with Dr [name] as discussed.

## 2018-01-23 LAB — MVISTA BLASTOMYCES QNT AG, URINE
INTERPRETATION - MVISTA BLASTOMYCES QNT AG: NEGATIVE
Result (ng/ml): NOT DETECTED ng/mL

## 2018-01-23 LAB — HISTOPLASMA ANTIGEN, URINE

## 2018-01-29 ENCOUNTER — Encounter: Payer: Self-pay | Admitting: Internal Medicine

## 2018-01-29 ENCOUNTER — Telehealth: Payer: Self-pay

## 2018-01-29 ENCOUNTER — Other Ambulatory Visit: Payer: Self-pay | Admitting: Internal Medicine

## 2018-01-29 ENCOUNTER — Telehealth: Payer: Self-pay | Admitting: Internal Medicine

## 2018-01-29 MED ORDER — VORICONAZOLE 200 MG PO TABS: 200.0000 mg | ORAL_TABLET | Freq: Two times a day (BID) | ORAL | 2 refills | Status: DC

## 2018-01-29 MED FILL — VORICONAZOLE 200 MG TABS: 200 | 30 days supply | Qty: 60 | Fill #0

## 2018-01-29 NOTE — Progress Notes (Signed)
His culture now positive for Aspergillus.  I am going to start him on voriconazole and prescription sent.  I attempted to call him x 1. He will not need a bronchoscopy at this time.   Gardiner Barefootobert W Comer, MD

## 2018-01-29 NOTE — Telephone Encounter (Signed)
-----   Message from Gardiner Barefootobert W Comer, MD sent at 01/29/2018  9:54 AM EDT ----- He now has a positive culture for Aspergillus on his sputum and I would like him to start treatment.  I attempted to call him x 1 and got voicemail.  I [facility] if you can let him know.  He will not need a bronchoscopy at this time and [facility].     thanks

## 2018-01-29 NOTE — Telephone Encounter (Signed)
Patient now with a positive Aspergillus on sputum culture.  Attempted to call him x 1.  I have [facility] in [facility].  He will not need a bronchoscopy at this time.   Discussed with pulmonary.   Gardiner Barefootobert W Sonda Coppens, MD

## 2018-01-29 NOTE — Telephone Encounter (Signed)
Attempted to call patient x1 to inform him that he had a medication to pick up [facility] and that he does not need a bronchoscopy at  This time Towanda OctaveLanatra Abbigaile Rockman, LPN

## 2018-01-29 NOTE — Telephone Encounter (Signed)
Spoke with patient who informed me he just received call from our staff and was told he did not need bronchoscopy.     Verified by documentation.   Laurell Josephsammy K Saiya Crist, RN

## 2018-01-29 NOTE — Telephone Encounter (Signed)
Patient returning a call. This nurse informed him of prescription to be picked up at  [facility] and that he does not need a bronchoscopy at this time. Patient confirmed understanding these instructions. Towanda OctaveLanatra Maudean Hoffmann, LPN

## 2018-02-25 MED FILL — SHIPPING COST: 1 days supply | Qty: 1 | Fill #0

## 2018-02-25 MED FILL — VORICONAZOLE 200 MG TABS: 200 | 30 days supply | Qty: 60 | Fill #1

## 2018-03-02 LAB — AFB CULTURE, BLOOD
MICRO NUMBER:: 90388110
SPECIMEN QUALITY:: ADEQUATE

## 2018-03-03 ENCOUNTER — Encounter: Payer: Self-pay | Admitting: Internal Medicine

## 2018-03-03 ENCOUNTER — Ambulatory Visit: Payer: 59 | Admitting: Internal Medicine

## 2018-03-03 DIAGNOSIS — R911 Solitary pulmonary nodule: Secondary | ICD-10-CM

## 2018-03-03 DIAGNOSIS — Z5181 Encounter for therapeutic drug level monitoring: Secondary | ICD-10-CM

## 2018-03-03 DIAGNOSIS — B449 Aspergillosis, unspecified: Secondary | ICD-10-CM | POA: Diagnosis not present

## 2018-03-03 LAB — FUNGUS CULTURE W SMEAR
MICRO NUMBER:: 90393968
SMEAR:: NONE SEEN
SPECIMEN QUALITY: ADEQUATE

## 2018-03-03 LAB — MYCOBACTERIA,CULT W/FLUOROCHROME SMEAR
MICRO NUMBER: 90393969
SMEAR: NONE SEEN
SPECIMEN QUALITY: ADEQUATE

## 2018-03-03 NOTE — Progress Notes (Signed)
   Subjective:    Patient ID: Jesse Wong, male    DOB: 1981-03-11, 37 y.o.   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 Has been feeling poorly for several months and a pulmonary nodule was noted.  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 Now not needing his inhalers, less fatigue and malaise, not on any prednisone. No other growth on culture.  Feeling well.  Some mild visual disturbances after taking the medication but not debilitating.     Review of Systems  Constitutional: Negative for chills, fatigue and fever.  Respiratory: Negative for cough, shortness of breath and wheezing.   Gastrointestinal: Negative for diarrhea.  Skin: Negative for rash.       Objective:   Physical Exam  Constitutional: He appears well-developed and well-nourished. No distress.  Eyes: No scleral icterus.  Cardiovascular: Normal rate, regular rhythm and normal heart sounds.  No murmur heard. Pulmonary/Chest: Effort normal and breath sounds normal. No respiratory distress.  Skin: No rash noted.   SH: no tobacco       Assessment & Plan:

## 2018-03-03 NOTE — Assessment & Plan Note (Signed)
After his next visit in 6 weeks, I will repeat the CT scan.

## 2018-03-03 NOTE — Assessment & Plan Note (Signed)
Will check LFTs  °

## 2018-03-03 NOTE — Assessment & Plan Note (Signed)
Culture diagnosis and improving ov erall so seems to be on the right track.  I will treat for 3 months.

## 2018-03-04 LAB — COMPLETE METABOLIC PANEL WITH GFR
AG Ratio: 2 (calc) (ref 1.0–2.5)
ALBUMIN MSPROF: 4.5 g/dL (ref 3.6–5.1)
ALKALINE PHOSPHATASE (APISO): 80 U/L (ref 40–115)
ALT: 12 U/L (ref 9–46)
AST: 14 U/L (ref 10–40)
BILIRUBIN TOTAL: 0.4 mg/dL (ref 0.2–1.2)
BUN: 13 mg/dL (ref 7–25)
CHLORIDE: 103 mmol/L (ref 98–110)
CO2: 31 mmol/L (ref 20–32)
Calcium: 9.6 mg/dL (ref 8.6–10.3)
Creat: 1.07 mg/dL (ref 0.60–1.35)
GFR, Est African American: 102 mL/min/{1.73_m2} (ref >=60)
GFR, Est Non African American: 88 mL/min/{1.73_m2} (ref >=60)
Globulin: 2.3 g/dL (calc) (ref 1.9–3.7)
Glucose, Bld: 89 mg/dL (ref 65–99)
POTASSIUM: 4.3 mmol/L (ref 3.5–5.3)
Sodium: 140 mmol/L (ref 135–146)
Total Protein: 6.8 g/dL (ref 6.1–8.1)

## 2018-03-11 ENCOUNTER — Ambulatory Visit (INDEPENDENT_AMBULATORY_CARE_PROVIDER_SITE_OTHER): Payer: 59 | Admitting: Family Medicine

## 2018-03-11 ENCOUNTER — Encounter: Payer: Self-pay | Admitting: Family Medicine

## 2018-03-11 ENCOUNTER — Other Ambulatory Visit: Payer: Self-pay | Admitting: Family Medicine

## 2018-03-11 VITALS — BP 121/66 | HR 59 | Ht 79.0 in | Wt 171.0 lb

## 2018-03-11 DIAGNOSIS — Z23 Encounter for immunization: Secondary | ICD-10-CM | POA: Diagnosis not present

## 2018-03-11 DIAGNOSIS — Z13220 Encounter for screening for lipoid disorders: Secondary | ICD-10-CM | POA: Diagnosis not present

## 2018-03-11 DIAGNOSIS — Z0000 Encounter for general adult medical examination without abnormal findings: Secondary | ICD-10-CM | POA: Diagnosis not present

## 2018-03-11 MED ORDER — ALBUTEROL SULFATE HFA 108 (90 BASE) MCG/ACT IN AERS: 2.0000 | INHALATION_SPRAY | Freq: Four times a day (QID) | RESPIRATORY_TRACT | 5 refills | Status: DC | PRN

## 2018-03-11 MED FILL — VENTOLIN HFA 90 MCG INHALER: 108 (90 BAS | 25 days supply | Qty: 18 | Fill #0

## 2018-03-11 MED FILL — FLUTICASONE PROP 50 MCG SPR: 50 | 30 days supply | Qty: 16 | Fill #0

## 2018-03-11 NOTE — Progress Notes (Signed)
Subjective:    Patient ID: Jesse Wong, male    DOB: 1981-04-19, 37 y.o.   MRN: [id number]  HPI 77 -year-old male w/ hx of microscopic polyangiitis is here today for complete physical exam. He is undergoing tx for Aspergillus. He is physically active.   Review of Systems  No new symptoms.     BP 121/66   Pulse (!) 59   Ht  (2.007 m)   Wt 171 lb (77.6 kg)   SpO2 99%   BMI 19.26 kg/m     No Known Allergies  Past Medical History:  Diagnosis Date  . Allergy   . Arthritis   . Asthma   . Chronic prostatitis   . Myalgia   . Pulmonary infiltrates   . Sinusitis, chronic   . Tonsillar abscess   . Vasculitis [facility])     Past Surgical History:  Procedure Laterality Date  . BRONCHOSCOPY  2011   TBBX ----inflammation  . IR FLUORO GUIDE CV LINE RIGHT  01/31/2017  . IR FLUORO GUIDE CV MIDLINE PICC RIGHT  01/30/2017  . IR US GUIDE VASC ACCESS RIGHT  01/30/2017  . peritonsilar abscess    . TYMPANOPLASTY      Social History   Socioeconomic History  . Marital status: Married    Spouse name: Not on file  . Number of children: 1  . Years of education: Not on file  . Highest education level: Not on file  Occupational History  . Occupation: Engineer, manufacturing systems: [employer]  Social Needs  . Financial resource strain: Not on file  . Food insecurity:    Worry: Not on file    Inability: Not on file  . Transportation needs:    Medical: Not on file    Non-medical: Not on file  Tobacco Use  . Smoking status: Never Smoker  . Smokeless tobacco: Never Used  Substance and Sexual Activity  . Alcohol use: Yes    Comment: occasional  . Drug use: No  . 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  Lifestyle  . Physical activity:    Days per week: Not on file    Minutes per session: Not on file  . Stress: Not on file  Relationships  . Social connections:    Talks on phone: Not on file    Gets together:  Not on file    Attends religious service: Not on file    Active member of club or organization: Not on file    Attends meetings of clubs or organizations: Not on file    Relationship status: Not on file  . Intimate partner violence:    Fear of current or ex partner: Not on file    Emotionally abused: Not on file    Physically abused: Not on file    Forced sexual activity: Not on file  Other Topics Concern  . Not on file  Social History Narrative  . Not on file    Family History  Problem Relation Age of Onset  . Heart attack Unknown        grandmother  . Hyperlipidemia Father   . Thyroid disease Father        hashimoto's thyroidistis  . Heart disease Paternal Grandfather     Outpatient Encounter Medications as of 03/11/2018  Medication Sig  . albuterol (VENTOLIN HFA) 108 (90 Base) MCG/ACT inhaler Inhale 2 puffs into the  lungs every 6 (six) hours as needed for wheezing or shortness of breath.  . benzonatate (TESSALON) 200 MG capsule TAKE 1 CAPSULE BY MOUTH 3 TIMES DAILY AS NEEDED FOR COUGH  . cholecalciferol (VITAMIN D) 1000 UNITS tablet Take 1 tablet (1,000 Units total) by mouth daily.  . DULERA 200-5 MCG/ACT AERO INHALE 2 PUFFS INTO THE LUNGS 2 TIMES DAILY.  [name] Fluticasone Propionate (XHANCE) 93 MCG/ACT EXHU Place 2 sprays into the nose 2 (two) times daily.  . Nebulizers (COMPRESSOR/NEBULIZER) MISC Use as direccted  . predniSONE (DELTASONE) 10 MG tablet Take 10 mg by mouth daily.  [name] voriconazole (VFEND) 200 MG tablet Take 1 tablet (200 mg total) by mouth 2 (two) times daily.  . [DISCONTINUED] albuterol (VENTOLIN HFA) 108 (90 Base) MCG/ACT inhaler Inhale 2 puffs into the lungs every 6 (six) hours as needed for wheezing or shortness of breath.   No facility-administered encounter medications on file as of 03/11/2018.           Objective:   Physical Exam  Constitutional: He is oriented to person, place, and time. He appears well-developed and well-nourished.  HENT:  Head:  Normocephalic and atraumatic.  Right Ear: External ear normal.  Left Ear: External ear normal.  Nose: Nose normal.  Mouth/Throat: Oropharynx is clear and moist.  Eyes: Pupils are equal, round, and reactive to light. Conjunctivae and EOM are normal.  Neck: Normal range of motion. Neck supple. No thyromegaly present.  Cardiovascular: Normal rate, regular rhythm, normal heart sounds and intact distal pulses.  Pulmonary/Chest: Effort normal and breath sounds normal.  Abdominal: Soft. Bowel sounds are normal. He exhibits no distension and no mass. There is no tenderness. There is no rebound and no guarding.  Musculoskeletal: Normal range of motion.  Lymphadenopathy:    He has no cervical adenopathy.  Neurological: He is alert and oriented to person, place, and time. He has normal reflexes.  Skin: Skin is warm and dry.  Psychiatric: He has a normal mood and affect. His behavior is normal. Judgment and thought content normal.       Assessment & Plan:  CPE Keep up a regular exercise program and make sure you are eating a healthy diet Try to eat 4 servings of dairy a day, or if you are lactose intolerant take a calcium with vitamin D daily.  Your vaccines are up to date.  Pneumoccoal 23 given.  Due for screening lipids.

## 2018-03-11 NOTE — Patient Instructions (Signed)

## 2018-03-11 NOTE — Telephone Encounter (Signed)
Looks like Dr. Maple Hudson prescribes this for the patient. Do you want to refill this?

## 2018-03-24 MED FILL — VORICONAZOLE 200 MG TABS: 200 | 30 days supply | Qty: 60 | Fill #2

## 2018-04-06 MED FILL — BENZONATATE 200 MG CAPS: 200 | 10 days supply | Qty: 30 | Fill #4 | Status: TO

## 2018-04-12 IMAGING — CT CT CHEST W/O CM
2 of 4 series · 15 of 36 positions shown, 18 images · non-contrast
Comparison: 12/05/2017 chest radiograph.  10/20/2015 chest CT.

CLINICAL DATA: Persistent cough and fever. History of vasculitis
(microscopic polyangiitis).

EXAM:
CT CHEST WITHOUT CONTRAST
TECHNIQUE: Multidetector CT imaging of the chest was performed following the
standard protocol without IV contrast.

[Series 3: lungs · axial · 0.67mm/px · z∈[-356,-50]mm · 12 of 173 slices shown, 15 images]
[im 10/173  mediastinal]
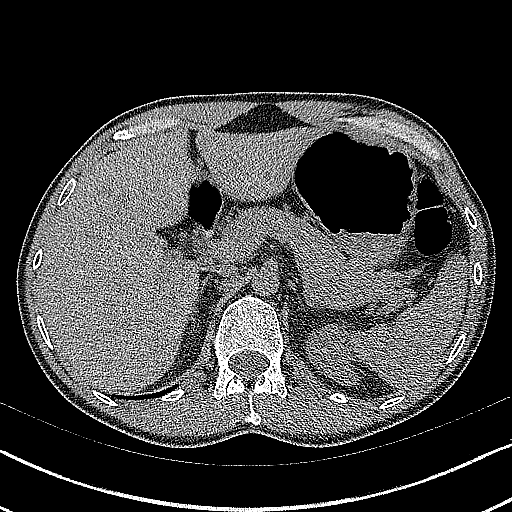
[im 10/173  lung]
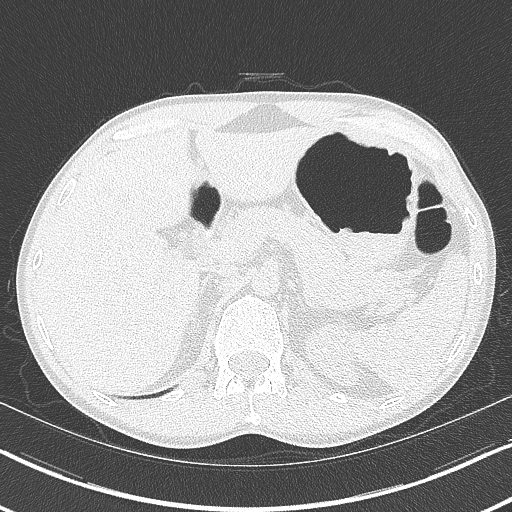
[im 28/173  lung]
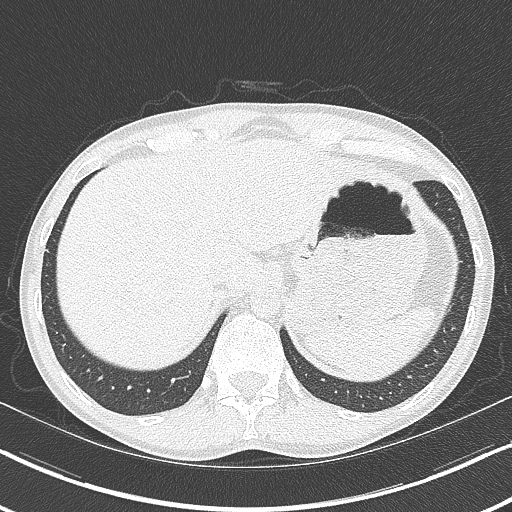
[im 37/173  lung]
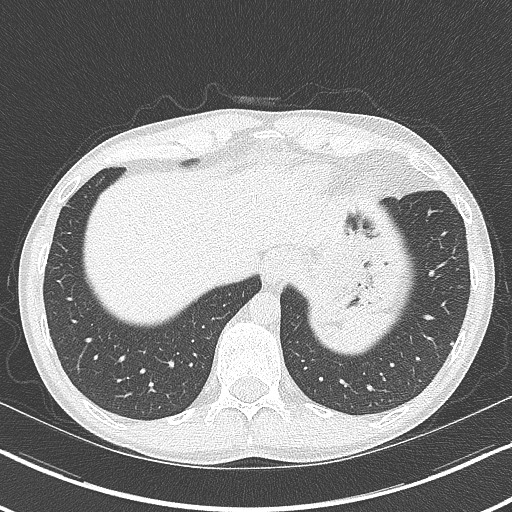
[im 55/173  lung]
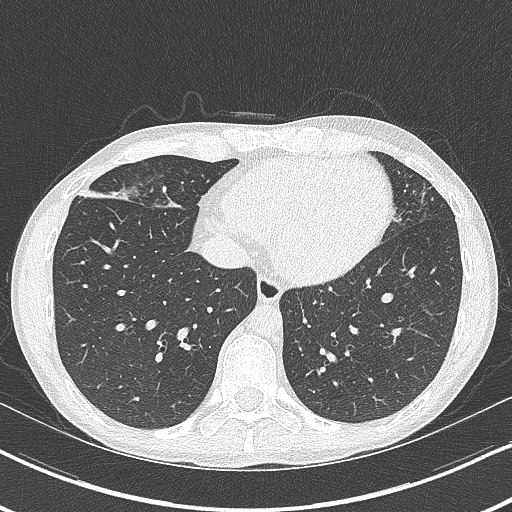
[im 64/173  mediastinal]
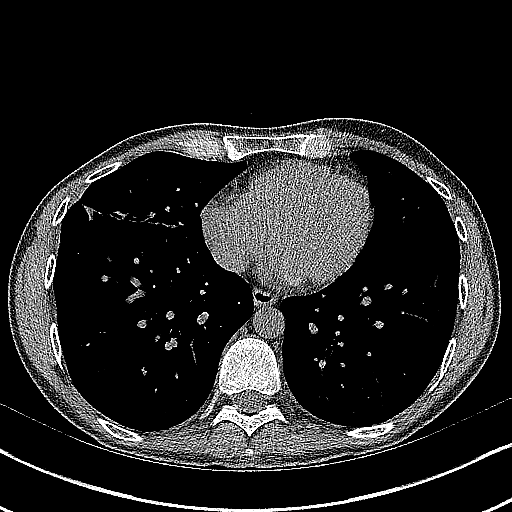
[im 64/173  lung]
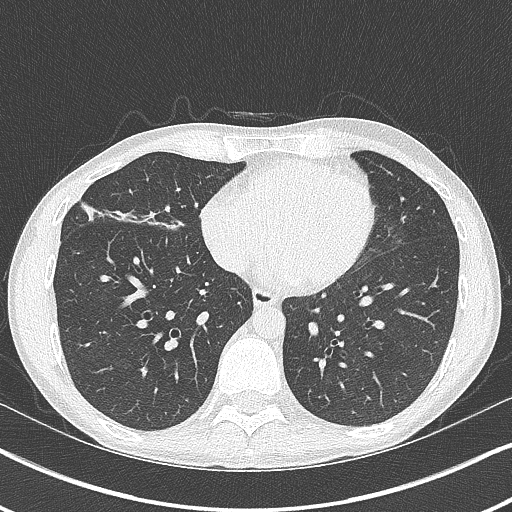
[im 82/173  lung]
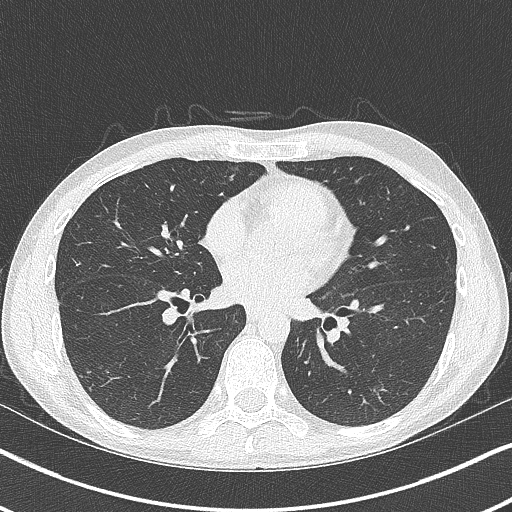
[im 91/173  lung]
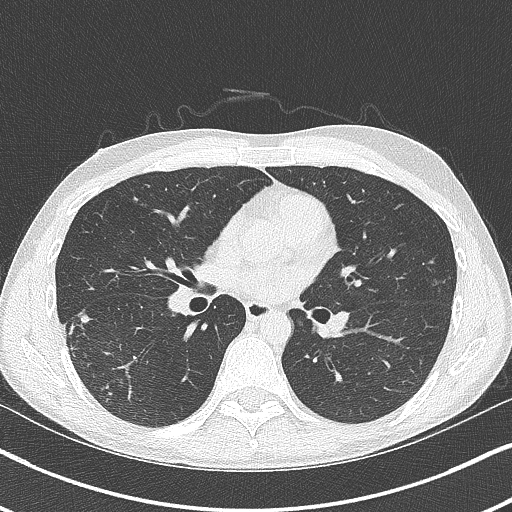
[im 109/173  lung]
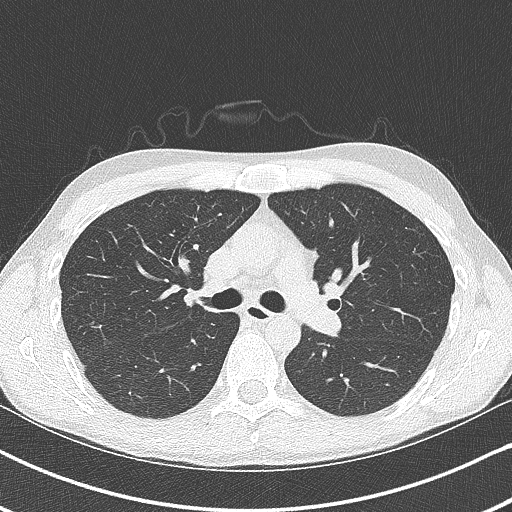
[im 118/173  mediastinal]
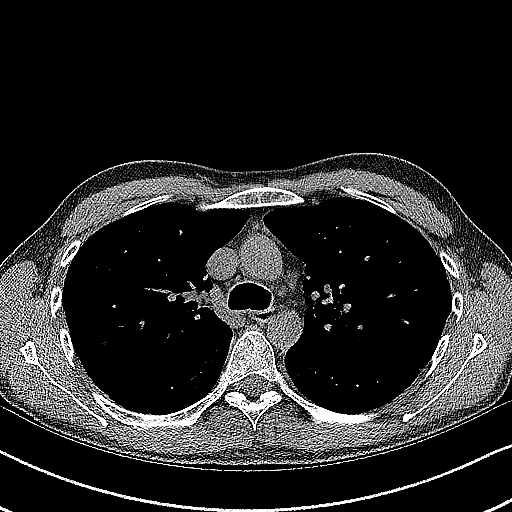
[im 118/173  lung]
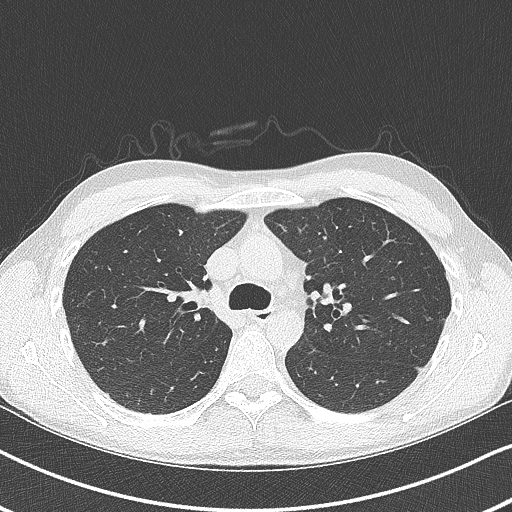
[im 136/173  lung]
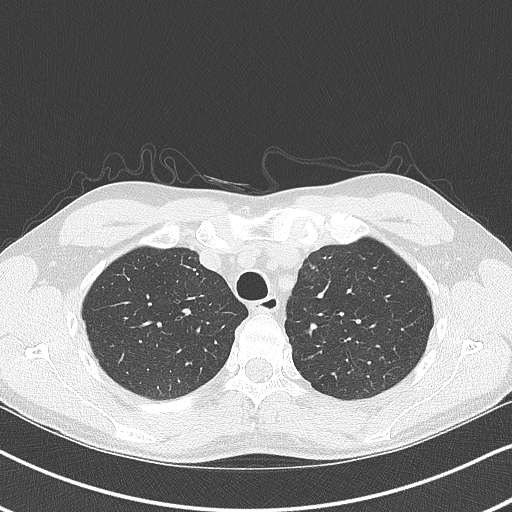
[im 145/173  lung]
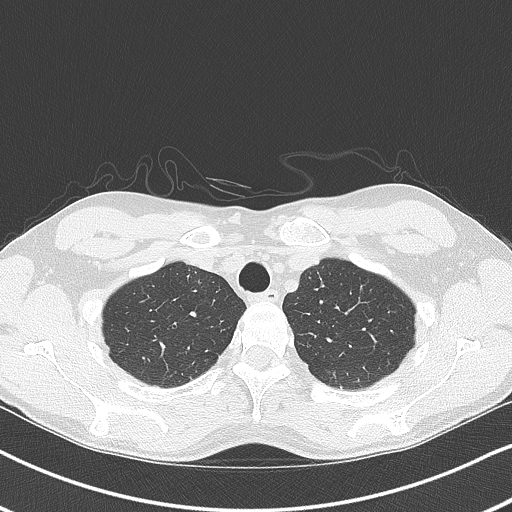
[im 163/173  lung]
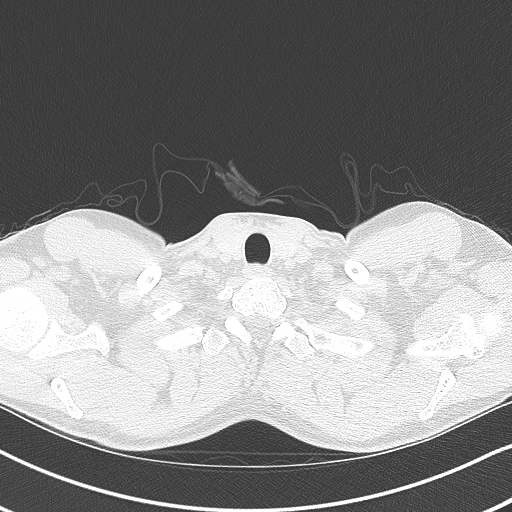

[Series 5: coronal · coronal · 0.67mm/px · 3 of 126 slices shown]
[im 26/126  lung]
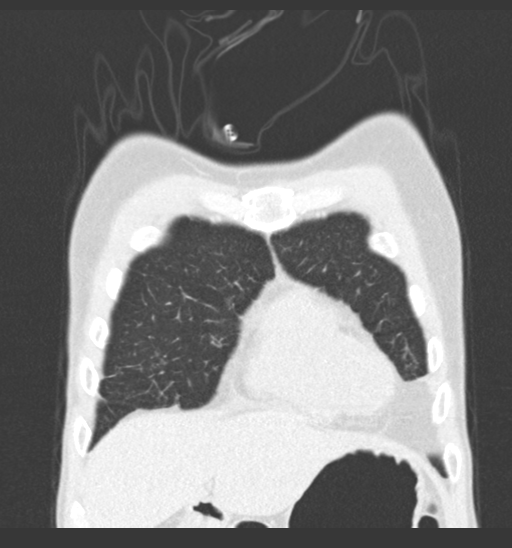
[im 51/126  lung]
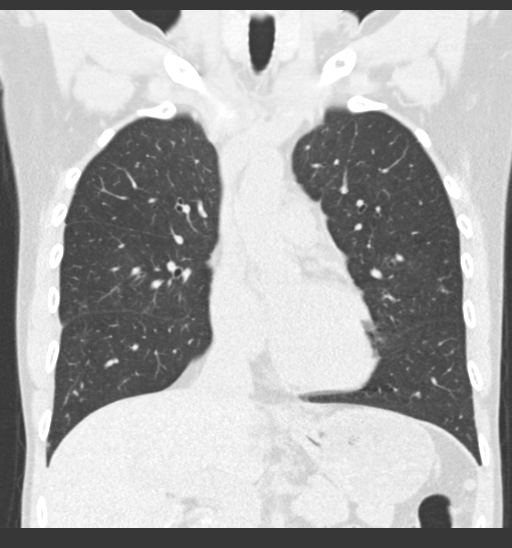
[im 76/126  lung]
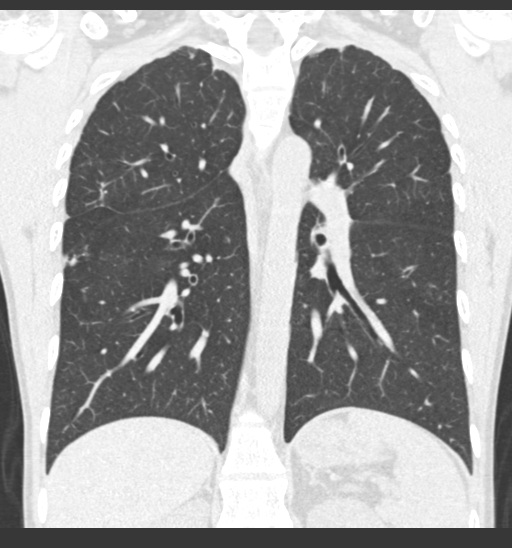

[15 of 36 positions shown; findings below may reference images not displayed]

FINDINGS: Cardiovascular: Normal heart size. No significant pericardial
fluid/thickening. Great vessels are normal in course and caliber.

Mediastinum/Nodes: No discrete thyroid nodules. Unremarkable
esophagus. No pathologically enlarged axillary, mediastinal or gross
hilar lymph nodes, noting limited sensitivity for the detection of
hilar adenopathy on this noncontrast study.

Lungs/Pleura: No pneumothorax. No pleural effusion. Mildly thickened
parenchymal band in the posterior right middle lobe with associated
mild volume loss, new since 10/20/2015 chest CT, compatible with
postinfectious/postinflammatory scarring. There is mild patchy
tree-in-bud opacity in the right middle lobe, lingula and peripheral
right lower lobe with associated mild cylindrical and varicoid
bronchiectasis in these locations, new. There is a nodular
subpleural 1.6 cm opacity in the anterior peripheral right lower
lobe (series 3/image 85).

Upper abdomen: No acute abnormality.

Musculoskeletal: No aggressive appearing focal osseous lesions.
Minimal thoracic spondylosis.
IMPRESSION: New mild patchy tree-in-bud opacities in the right middle lobe,
lingula and right lower lobe with associated mild bronchiectasis.
New nodular 1.6 cm right lower lobe subpleural opacity. New
postinfectious/postinflammatory scarring in the posterior right
middle lobe. This spectrum of findings is most suggestive of a
chronic infectious or inflammatory bronchiolitis such as due to
atypical mycobacterial infection (SERGAZIN), particularly if the patient
is on immunosuppressive therapy for the vasculitis. Follow-up post
treatment chest CT suggested in 6 months.

## 2018-04-12 IMAGING — CT CT PARANASAL SINUSES LIMITED
1 series · 13 of 15 positions shown, 17 images · non-contrast
Comparison: 06/20/2016

CLINICAL DATA: Cough and fever. Headache. History of vasculitis and
chemotherapy.

EXAM:
CT PARANASAL SINUS LIMITED WITHOUT CONTRAST
TECHNIQUE: Non-contiguous multidetector CT images of the paranasal sinuses were
obtained in a single plane without contrast.

[Series 3: limited sinus st · axial · 0.32mm/px · z∈[-144,-24]mm · 13 of 15 slices shown, 17 images]
[im 2/15  brain]
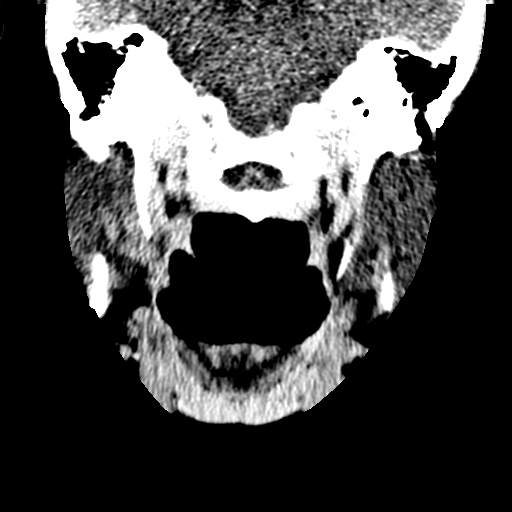
[im 2/15  bone]
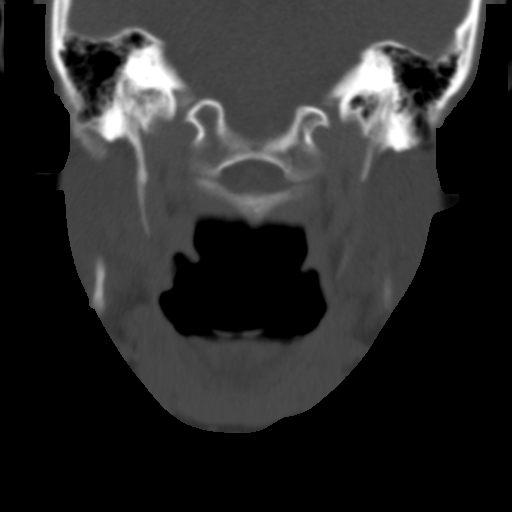
[im 3/15  bone]
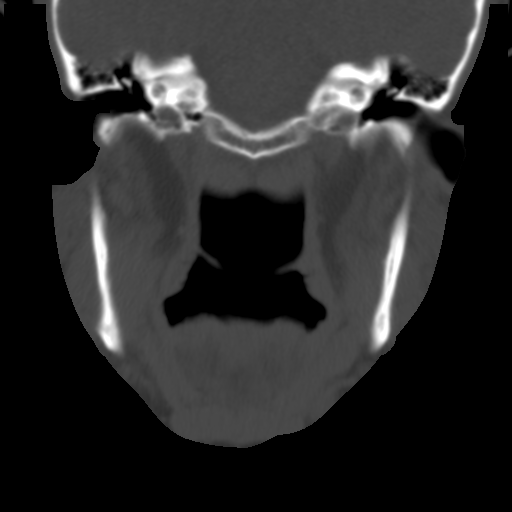
[im 4/15  bone]
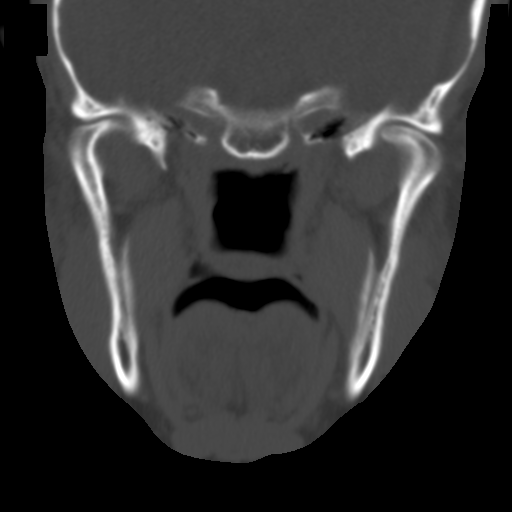
[im 5/15  bone]
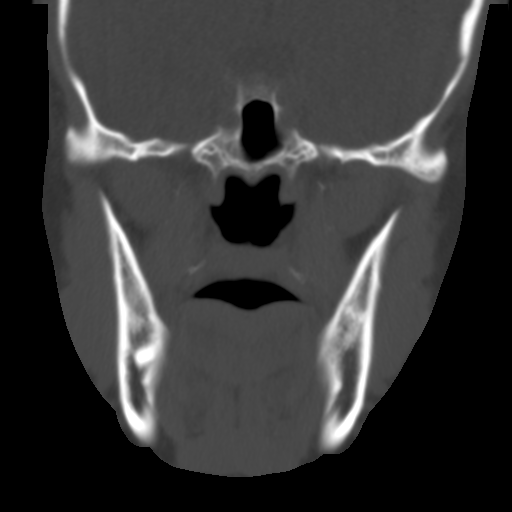
[im 6/15  brain]
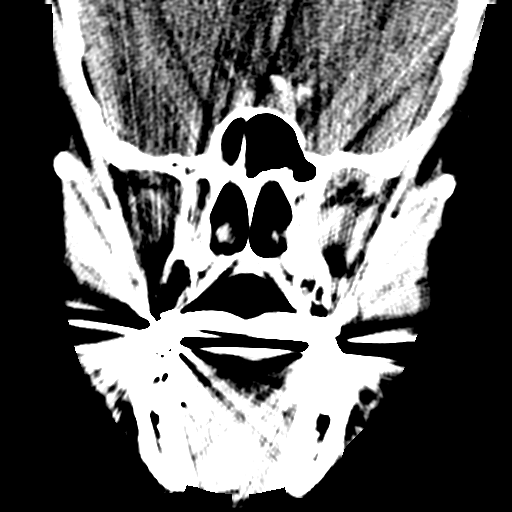
[im 6/15  bone]
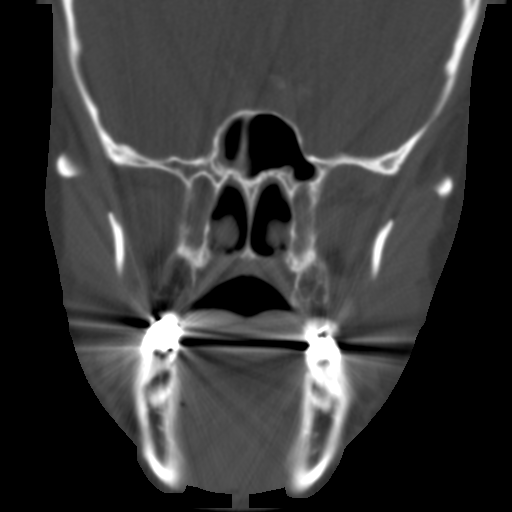
[im 7/15  bone]
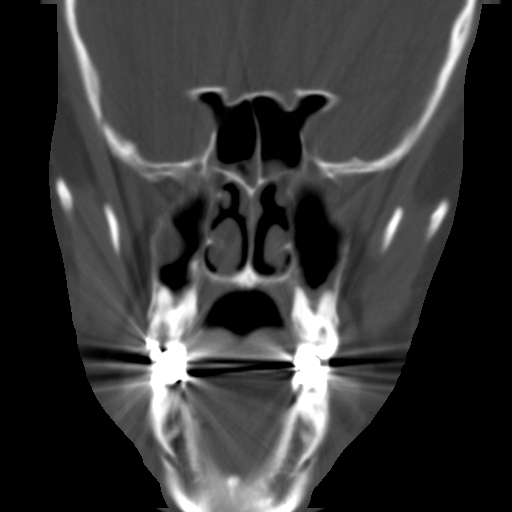
[im 8/15  bone]
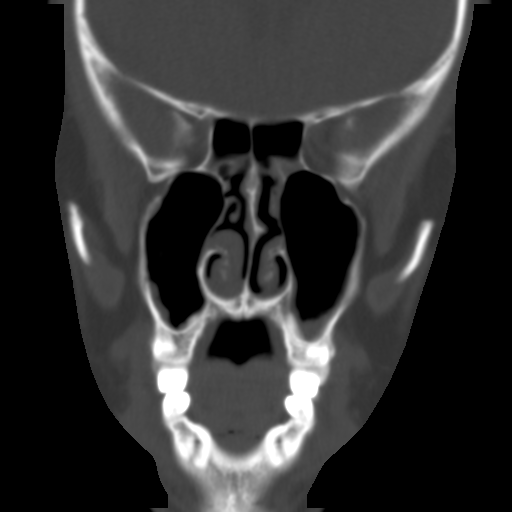
[im 9/15  bone]
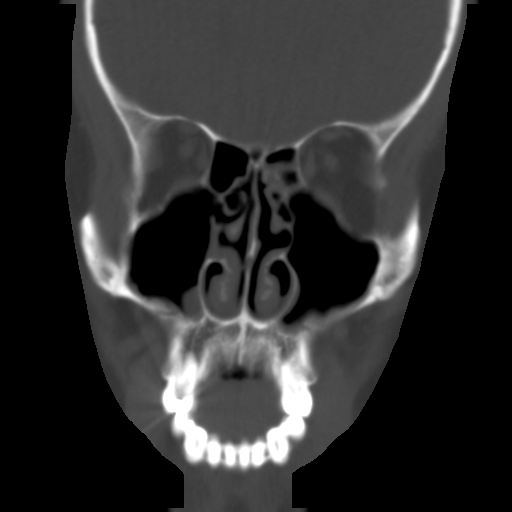
[im 10/15  brain]
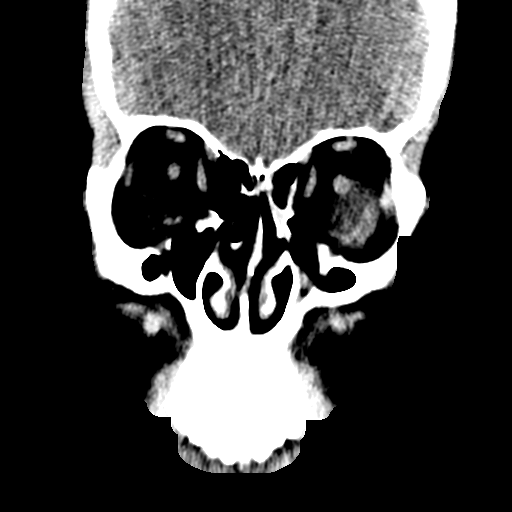
[im 10/15  bone]
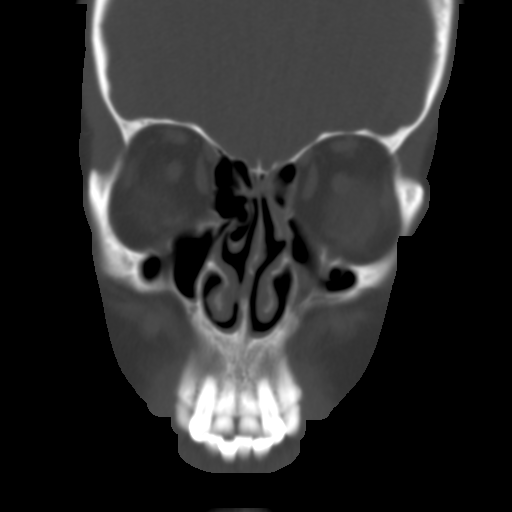
[im 11/15  bone]
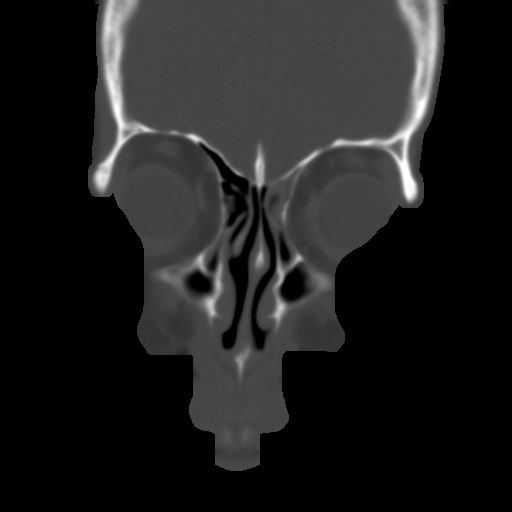
[im 12/15  bone]
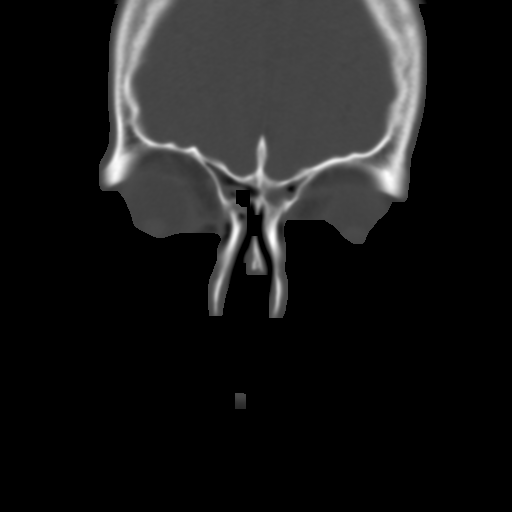
[im 13/15  bone]
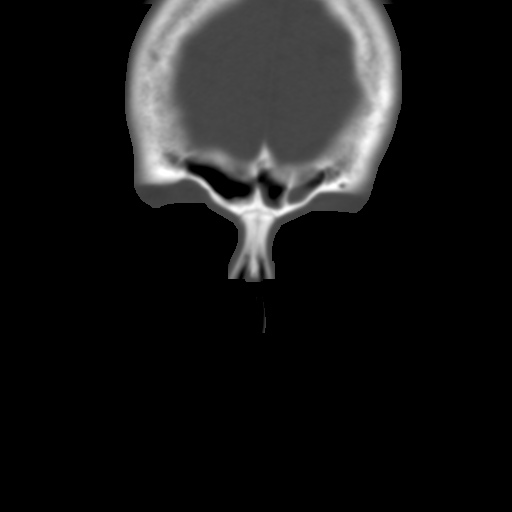
[im 14/15  brain]
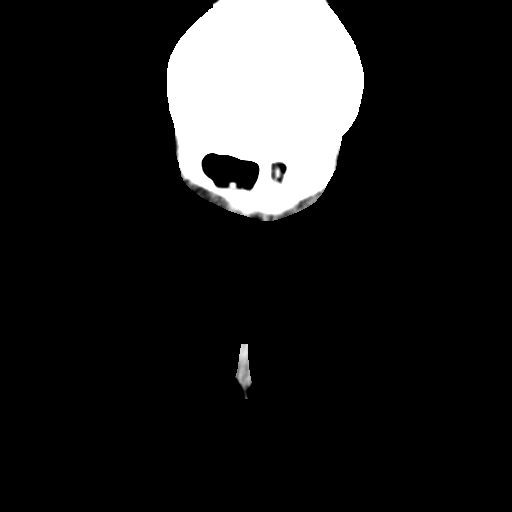
[im 14/15  bone]
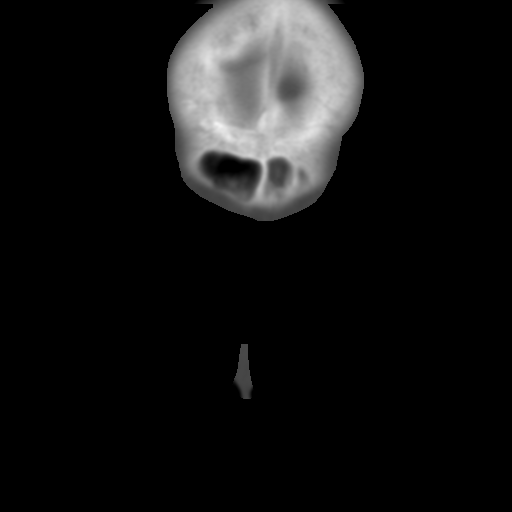

[13 of 15 positions shown; findings below may reference images not displayed]

FINDINGS: There is mild mucosal thickening in the right greater than left
frontal sinuses, right ethmoid air cells, left greater than right
sphenoid sinuses, and both maxillary sinuses. There are at most
minimal mixed interval changes compared to the prior study, with the
overall degree of sinus mucosal disease being similar to the prior
exam. No fluid level is seen. Slight rightward nasal septal
deviation is unchanged. The regional soft tissues are grossly
unremarkable.
IMPRESSION: Mild chronic mucosal thickening throughout the paranasal sinuses,
similar in degree to the prior study. No fluid.

## 2018-04-17 ENCOUNTER — Ambulatory Visit: Payer: 59 | Admitting: Sports Medicine

## 2018-04-20 MED FILL — SULFAMETHOXAZOLE-TMP DS TAB: 800-160 | 35 days supply | Qty: 15 | Fill #1

## 2018-04-20 MED FILL — BENZONATATE 200 MG CAPS: 200 | 10 days supply | Qty: 30 | Fill #5 | Status: TO

## 2018-04-22 ENCOUNTER — Ambulatory Visit: Payer: 59 | Admitting: Internal Medicine

## 2018-04-22 ENCOUNTER — Encounter: Payer: Self-pay | Admitting: Internal Medicine

## 2018-04-22 VITALS — BP 145/87 | HR 72 | Temp 98.4°F | Ht 79.0 in | Wt 172.0 lb

## 2018-04-22 DIAGNOSIS — B449 Aspergillosis, unspecified: Secondary | ICD-10-CM | POA: Diagnosis not present

## 2018-04-22 DIAGNOSIS — M317 Microscopic polyangiitis: Secondary | ICD-10-CM | POA: Diagnosis not present

## 2018-04-22 DIAGNOSIS — R911 Solitary pulmonary nodule: Secondary | ICD-10-CM | POA: Diagnosis not present

## 2018-04-22 MED ORDER — VORICONAZOLE 200 MG PO TABS: 200.0000 mg | ORAL_TABLET | Freq: Two times a day (BID) | ORAL | 1 refills | Status: DC

## 2018-04-22 MED FILL — FLUTICASONE PROP 50 MCG SPR: 50 | 30 days supply | Qty: 16 | Fill #1

## 2018-04-22 MED FILL — VORICONAZOLE 200 MG TABS: 200 | 30 days supply | Qty: 60 | Fill #0

## 2018-04-22 NOTE — Assessment & Plan Note (Signed)
Will repeat a CT scan in about 1 month.  I would like to wait until his acute exacerbation is over to avoid any clouding of the nodules/bronchiectasis.

## 2018-04-22 NOTE — Assessment & Plan Note (Signed)
Currently requiring steroids and will wean as tolerated.

## 2018-04-22 NOTE — Progress Notes (Signed)
   Subjective:    Patient ID: Jesse Wong, male    DOB: 08-06-1981, 37 y.o.   MRN: [id number]  HPI Here for follow up of fungal pulmonary infection. Has been feeling poorly for several months and a pulmonary nodule was noted and some bronchiectasis.  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 He overall has improved but recently with an exacerbation of his underlying illness and is taking prednisone, now up to 25 mg daily.  He is feeling better though some wheezing still.  Some cough with congestion.     Review of Systems  Constitutional: Negative for chills, fatigue and fever.  Respiratory: Negative for cough, shortness of breath and wheezing.   Gastrointestinal: Negative for diarrhea.  Skin: Negative for rash.       Objective:   Physical Exam  Constitutional: He appears well-developed and well-nourished. No distress.  Eyes: No scleral icterus.  Cardiovascular: Normal rate, regular rhythm and normal heart sounds.  No murmur heard. Pulmonary/Chest: Effort normal and breath sounds normal. No respiratory distress.  Skin: No rash noted.   SH: no tobacco       Assessment & Plan:

## 2018-04-22 NOTE — Assessment & Plan Note (Signed)
On voriconazole and will continue one more month and repeat the CT in 1 month.  Some visual issues but no worsening.

## 2018-05-01 MED FILL — DULERA 200 MCG/5 MCG INH: 200-5 | 30 days supply | Qty: 13 | Fill #3

## 2018-05-04 ENCOUNTER — Ambulatory Visit (INDEPENDENT_AMBULATORY_CARE_PROVIDER_SITE_OTHER): Payer: 59 | Admitting: Family Medicine

## 2018-05-04 ENCOUNTER — Ambulatory Visit (INDEPENDENT_AMBULATORY_CARE_PROVIDER_SITE_OTHER): Payer: 59

## 2018-05-04 ENCOUNTER — Encounter: Payer: Self-pay | Admitting: Family Medicine

## 2018-05-04 VITALS — BP 141/67 | HR 63 | Temp 98.3°F | Ht 79.0 in | Wt 172.0 lb

## 2018-05-04 DIAGNOSIS — M317 Microscopic polyangiitis: Secondary | ICD-10-CM | POA: Diagnosis not present

## 2018-05-04 DIAGNOSIS — R05 Cough: Secondary | ICD-10-CM

## 2018-05-04 DIAGNOSIS — B449 Aspergillosis, unspecified: Secondary | ICD-10-CM | POA: Diagnosis not present

## 2018-05-04 DIAGNOSIS — R059 Cough, unspecified: Secondary | ICD-10-CM

## 2018-05-04 MED ORDER — AZITHROMYCIN 250 MG PO TABS: 250.0000 mg | ORAL_TABLET | Freq: Every day | ORAL | 0 refills | Status: DC

## 2018-05-04 MED ORDER — IPRATROPIUM BROMIDE 0.06 % NA SOLN: 2.0000 | NASAL | 6 refills | Status: DC | PRN

## 2018-05-04 MED ORDER — CEFDINIR 300 MG PO CAPS: 300.0000 mg | ORAL_CAPSULE | Freq: Two times a day (BID) | ORAL | 0 refills | Status: DC

## 2018-05-04 MED FILL — AZITHROMYCIN 250 MG TABLET: 250 | 5 days supply | Qty: 6 | Fill #0

## 2018-05-04 MED FILL — CEFDINIR 300 MG CAPS: 300 | 7 days supply | Qty: 14 | Fill #0

## 2018-05-04 MED FILL — IPRATROPIUM 0.06% SPRAY: 0.06 | 7 days supply | Qty: 15 | Fill #0

## 2018-05-04 NOTE — Progress Notes (Signed)
Jesse Wong is a 37 y.o. male who presents to [facility]: [facility] Medicine today for cough.  Jesse Wong notes a several week history of productive cough associated with mild fevers and chills.  He has had to adjust his prednisone dose in response which has helped.  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  He has been using [employer] which helped as well.  He denies any chest pain palpitations or shortness of breath.  He feels well otherwise.  He currently takes voriconazole for aspergillosis and Bactrim 3 days a week for prophylaxis.   ROS as above:  Exam:  BP (!) 141/67   Pulse 63   Temp 98.3 F (36.8 C) (Oral)   Ht 6\' 7"  (2.007 m)   Wt 172 lb (78 kg)   BMI 19.38 kg/m  Gen: Well NAD HEENT: EOMI,  MMM no significant nasal discharge.  Posterior pharynx with cobblestoning. Lungs: Normal work of breathing.  Coarse breath sounds right lung field. Heart: RRR no MRG Abd: NABS, Soft. Nondistended, Nontender Exts: Brisk capillary refill, warm and well perfused.   Lab and Radiology Results No results found for this or any previous visit (from the past 72 hour(s)). Dg Chest 2 View  Result Date: 05/04/2018 CLINICAL DATA:  Cough for 5 weeks. EXAM: CHEST - 2 VIEW COMPARISON:  12/05/2017 FINDINGS: The heart size and mediastinal contours are within normal limits. Both lungs are clear. The visualized skeletal structures are unremarkable. IMPRESSION: Stable exam.  No active cardiopulmonary disease. Electronically Signed   By: Myles Rosenthal M.D.   On: 05/04/2018 16:25  I personally (independently) visualized and performed the interpretation of the images attached in this note. At the time of the visit chest x-ray to me was concerning for infiltrate of the left middle lobe.      Assessment and  Plan: 37 y.o. male with  Worsening respiratory symptoms over the last several weeks in the setting of multiple pulmonary etiologies.  Plan for sputum culture and empiric treatment with Omnicef and azithromycin which should give good double coverage against typical pulmonary pathogens.  We will send results of chest x-ray and sputum culture as well as this note to infectious disease and rheumatology.    Orders Placed This Encounter  Procedures  . Respiratory or Resp and Sputum Culture  . DG Chest 2 View    Order Specific Question:   Reason for exam:    Answer:   Cough, assess intra-thoracic pathology    Order Specific Question:   Preferred imaging location?    Answer:   [facility]   Meds ordered this encounter  Medications  . ipratropium (ATROVENT) 0.06 % nasal spray    Sig: Place 2 sprays into both nostrils every 4 (four) hours as needed for rhinitis.    Dispense:  10 mL    Refill:  6  . cefdinir (OMNICEF) 300 MG capsule    Sig: Take 1 capsule (300 mg total) by mouth 2 (two) times daily.    Dispense:  14 capsule    Refill:  0  . azithromycin (ZITHROMAX) 250 MG tablet    Sig: Take 1 tablet (250 mg total) by mouth daily. Take first 2 tablets together, then 1 every day until finished.    Dispense:  6 tablet    Refill:  0     Historical information moved to improve visibility  of documentation.  Past Medical History:  Diagnosis Date  . Allergy   . Arthritis   . Asthma   . Chronic prostatitis   . Myalgia   . Pulmonary infiltrates   . Sinusitis, chronic   . Tonsillar abscess   . Vasculitis [facility])    Past Surgical History:  Procedure Laterality Date  . BRONCHOSCOPY  2011   TBBX ----inflammation  . IR FLUORO GUIDE CV LINE RIGHT  01/31/2017  . IR FLUORO GUIDE CV MIDLINE PICC RIGHT  01/30/2017  . IR US GUIDE VASC ACCESS RIGHT  01/30/2017  . peritonsilar abscess    . TYMPANOPLASTY     Social History   Tobacco Use  . Smoking status: Never Smoker  . Smokeless  tobacco: Never Used  Substance Use Topics  . Alcohol use: Yes    Comment: occasional   family history includes Heart attack in his unknown relative; Heart disease in his paternal grandfather; Hyperlipidemia in his father; Thyroid disease in his father.  Medications: Current Outpatient Medications  Medication Sig Dispense Refill  . albuterol (VENTOLIN HFA) 108 (90 Base) MCG/ACT inhaler Inhale 2 puffs into the lungs every 6 (six) hours as needed for wheezing or shortness of breath. 1 Inhaler 5  . benzonatate (TESSALON) 200 MG capsule TAKE 1 CAPSULE BY MOUTH 3 TIMES DAILY AS NEEDED FOR COUGH 30 capsule 5  . cetirizine (ZYRTEC) 10 MG tablet Take 10 mg by mouth daily.    . cholecalciferol (VITAMIN D) 1000 UNITS tablet Take 1 tablet (1,000 Units total) by mouth daily. 30 tablet 6  . DULERA 200-5 MCG/ACT AERO INHALE 2 PUFFS INTO THE LUNGS 2 TIMES DAILY. 13 g 11  . fluticasone (FLONASE) 50 MCG/ACT nasal spray INSTILL 2 SPRAYS INTO EACH NOSTRIL DAILY. 16 g 3  . Fluticasone Propionate (XHANCE) 93 MCG/ACT EXHU Place 2 sprays into the nose 2 (two) times daily. 16 mL 12  . Nebulizers (COMPRESSOR/NEBULIZER) MISC Use as direccted 1 each 0  . predniSONE (DELTASONE) 10 MG tablet Take 10 mg by mouth daily.  0  . voriconazole (VFEND) 200 MG tablet Take 1 tablet (200 mg total) by mouth 2 (two) times daily. 60 tablet 1  . azithromycin (ZITHROMAX) 250 MG tablet Take 1 tablet (250 mg total) by mouth daily. Take first 2 tablets together, then 1 every day until finished. 6 tablet 0  . cefdinir (OMNICEF) 300 MG capsule Take 1 capsule (300 mg total) by mouth 2 (two) times daily. 14 capsule 0  . ipratropium (ATROVENT) 0.06 % nasal spray Place 2 sprays into both nostrils every 4 (four) hours as needed for rhinitis. 10 mL 6  . sulfamethoxazole-trimethoprim (BACTRIM DS,SEPTRA DS) 800-160 MG tablet   1   No current facility-administered medications for this visit.    No Known Allergies   Discussed warning signs or  symptoms. Please see discharge instructions. Patient expresses understanding.

## 2018-05-04 NOTE — Patient Instructions (Signed)
Thank you for coming in today. Start antibiotics Recheck as needed.  I will send copy of labs and xray and notes to ID and PCP.   Call or go to the emergency room if you get worse, have trouble breathing, have chest pains, or palpitations.   Use the atrovent nasal spray.

## 2018-05-05 DIAGNOSIS — H527 Unspecified disorder of refraction: Secondary | ICD-10-CM | POA: Diagnosis not present

## 2018-05-07 LAB — RESPIRATORY CULTURE OR RESPIRATORY AND SPUTUM CULTURE
MICRO NUMBER: 90836222
RESULT: NORMAL
SPECIMEN QUALITY: ADEQUATE

## 2018-05-08 ENCOUNTER — Encounter: Payer: Self-pay | Admitting: Family Medicine

## 2018-05-21 ENCOUNTER — Other Ambulatory Visit: Payer: 59

## 2018-05-26 MED FILL — SHIPPING COST: 1 days supply | Qty: 1 | Fill #0

## 2018-05-26 MED FILL — VORICONAZOLE 200 MG TABS: 200 | 30 days supply | Qty: 60 | Fill #1

## 2018-06-01 ENCOUNTER — Other Ambulatory Visit: Payer: Self-pay | Admitting: Internal Medicine

## 2018-06-01 ENCOUNTER — Ambulatory Visit (INDEPENDENT_AMBULATORY_CARE_PROVIDER_SITE_OTHER): Payer: 59

## 2018-06-01 DIAGNOSIS — R911 Solitary pulmonary nodule: Secondary | ICD-10-CM | POA: Diagnosis not present

## 2018-06-01 DIAGNOSIS — R918 Other nonspecific abnormal finding of lung field: Secondary | ICD-10-CM | POA: Diagnosis not present

## 2018-06-04 ENCOUNTER — Other Ambulatory Visit: Payer: 59

## 2018-06-09 DIAGNOSIS — L4059 Other psoriatic arthropathy: Secondary | ICD-10-CM | POA: Diagnosis not present

## 2018-06-09 DIAGNOSIS — B449 Aspergillosis, unspecified: Secondary | ICD-10-CM | POA: Diagnosis not present

## 2018-06-09 DIAGNOSIS — Z6820 Body mass index (BMI) 20.0-20.9, adult: Secondary | ICD-10-CM | POA: Diagnosis not present

## 2018-06-09 DIAGNOSIS — Z7952 Long term (current) use of systemic steroids: Secondary | ICD-10-CM | POA: Diagnosis not present

## 2018-06-09 DIAGNOSIS — M317 Microscopic polyangiitis: Secondary | ICD-10-CM | POA: Diagnosis not present

## 2018-06-10 ENCOUNTER — Encounter: Payer: Self-pay | Admitting: Internal Medicine

## 2018-06-10 ENCOUNTER — Ambulatory Visit: Payer: 59 | Admitting: Internal Medicine

## 2018-06-10 DIAGNOSIS — R911 Solitary pulmonary nodule: Secondary | ICD-10-CM

## 2018-06-10 DIAGNOSIS — B449 Aspergillosis, unspecified: Secondary | ICD-10-CM | POA: Diagnosis not present

## 2018-06-10 NOTE — Progress Notes (Signed)
   Subjective:    Patient ID: Serita KyleBenjamin J Vallejo, male    DOB: 01-18-1981, 37 y.o.   MRN: [id number]  HPI Here for follow up tree-in-bud opacities He has been on voriconazole for presumed aspergillus infection based on sputum culture.  I have repeated a CT scan to see if there is any improvement and unfortunately, the tree-in-bud area persists.  Overall he is stable, some congestion.  He recently had been suffering with an acute exacerbation and resolved with antibiotic therapy by his PCPs office.  No current fever or weight loss.  Nodule noted on CT also has remained stable.     Review of Systems  Constitutional: Negative for chills, fatigue and fever.  Respiratory: Negative for cough, shortness of breath and wheezing.   Gastrointestinal: Negative for diarrhea.  Skin: Negative for rash.       Objective:   Physical Exam  Constitutional: He appears well-developed and well-nourished. No distress.  Eyes: No scleral icterus.  Cardiovascular: Normal rate, regular rhythm and normal heart sounds.  No murmur heard. Pulmonary/Chest: Effort normal and breath sounds normal. No respiratory distress.  Skin: No rash noted.   SH: no tobacco       Assessment & Plan:

## 2018-06-10 NOTE — Assessment & Plan Note (Signed)
This is stable and may be more related to his underlying disease.

## 2018-06-10 NOTE — Assessment & Plan Note (Addendum)
No improvement despite nearly 3 months of voriconazole.  Tree-in-bud area somewhat worsened.   I will have him stop at this time.  With the worsening, I will discuss with Dr. Annamaria Boots the appropriateness of BAL now at this time for a broad differential including fungal, AFB, bacterial

## 2018-06-15 ENCOUNTER — Telehealth: Payer: Self-pay | Admitting: Pulmonary Disease

## 2018-06-15 NOTE — Telephone Encounter (Signed)
Please schedule ROV with me or NP this week to discuss need for bronchoscopy.

## 2018-06-15 NOTE — Telephone Encounter (Signed)
Called and spoke with pt regarding VS recommendations Scheduled ROV with VS for Friday 06/19/2018 at 11:45am Pt verbalized understanding, Nothing further needed.

## 2018-06-15 NOTE — Telephone Encounter (Signed)
-----   Message from Thayer Headings, MD sent at 06/10/2018 10:59 AM EDT ----- Garvin Fila - if you could review the recent CT.  I have been treating him for aspergillus and no real improvement, some worsening.  I think now a BAL would be appropriate to try to get a diagnosis,if you agree.   [place]

## 2018-06-19 ENCOUNTER — Ambulatory Visit: Payer: 59 | Admitting: Pulmonary Disease

## 2018-06-19 ENCOUNTER — Encounter: Payer: Self-pay | Admitting: Pulmonary Disease

## 2018-06-19 VITALS — BP 110/60 | HR 61 | Ht 79.0 in | Wt 176.0 lb

## 2018-06-19 DIAGNOSIS — R9389 Abnormal findings on diagnostic imaging of other specified body structures: Secondary | ICD-10-CM

## 2018-06-19 DIAGNOSIS — M3130 Wegener's granulomatosis without renal involvement: Secondary | ICD-10-CM | POA: Diagnosis not present

## 2018-06-19 NOTE — Progress Notes (Signed)
[facility] [facility]  Chief Complaint  Patient presents with  . Follow-up    discuss bronch. no sx currently.     Constitutional: BP 110/60 (BP Location: Left Arm, Cuff Size: Normal)   Pulse 61   Ht _0  (2.007 m)   Wt 176 lb (79.8 kg)   SpO2 98%   BMI 19.83 kg/m   History of Present Illness: Jesse Wong is a 37 y.o. 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  He is followed by Dr. Annamaria Wong.  He has been treated with rituxan administered by Dr. Amil Wong with rheumatology and steroids.  He has been off therapy recently due to concern about respiratory infection.  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    He is followed by Dr. Linus Wong with ID.  He was having recurrent fevers.  He has tree in bud infiltrates on chest imaging.  He had sputum culture from March 2019.  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  Fevers improved some.  His f/u CT chest showed he still had persistent infiltrates, and it was felt he needed more direct airway sampling with bronchoscopy.  He has chronic sinus congestion.  He has cough with clear to yellow sputum.  He has joint pain and skin peeling when he is not on prednisone.  He is not having abdominal pain, reflux, diarrhea, or leg swelling.   Comprehensive Respiratory Exam:  Appearance - well kempt  ENMT - nasal mucosa moist, turbinates clear, midline nasal septum, no dental lesions, no gingival bleeding, no oral exudates, no tonsillar hypertrophy Neck - no masses, trachea midline, no thyromegaly, no elevation in JVP Respiratory - normal appearance of chest wall, normal respiratory effort w/o accessory muscle use, no dullness on percussion, no tactile fremitus, no wheezing or rales CV - s1s2 regular rate and rhythm, no murmurs, no peripheral edema, no varicosities,  radial pulses symmetric GI - soft, non tender, no masses, no hepatosplenomegaly Lymph - no adenopathy noted in neck and axillary areas MSK - normal muscle strength and tone, normal gait Ext - no cyanosis, clubbing, or joint inflammation noted Skin - no rashes, lesions, or ulcers Neuro - oriented to person, place, and time Psych - normal mood and affect  Discussion: He has persistent tree in bud infiltrates on chest imaging studies associated with intermittent fever and productive cough.  This is in the setting of P-ANCA positive vasculitis.  He was previously on therapy with rituxan and prednisone, but has been off therapy recently due to concern about respiratory infection.  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  He needs to have bronchoscopy with airway sampling.  Assessment/Plan:  Abnormal CT chest with tree in bud infiltrates. - will arrange for bronchoscopy for 2 pm on 06/25/18 at [facility] - risks detailed as bleeding, pneumothorax, infection, and non diagnosis - he is agreeable to proceed - will plan to perform BAL and send for bacterial, AFB, and fungal cultures   Patient Instructions  Will arrange for bronchoscopy and let you know about date and time when these are finalized  Follow up in 2 weeks with Dr. Annamaria Wong, Dr. Halford Wong, or Nurse Practitioner    Jesse Mires, MD [place] 06/19/2018, 12:27 PM  Flow Sheet  Pulmonary tests: PFT 05/09/10 >> FEV1 4.19 (83%), FEV1%  85, TLC 7.22 (78%), DLCO 113%, no BD Quantiferon gold 01/15/18 >> negative CT chest 06/01/18 (reviewed by me) >> tree in bud in upper lobes, mild cylindrical BTX, 1.5 cm nodule RLL  Review of systems: Reviewed and negative except in HPI  Past Medical History: He  has a past medical history of Allergy, Arthritis, Asthma, Chronic prostatitis, Myalgia, Pulmonary infiltrates, Sinusitis, chronic, Tonsillar  abscess, and Vasculitis ([place]).  Past Surgical History: He  has a past surgical history that includes Tympanoplasty; peritonsilar abscess; Bronchoscopy (2011); IR Fluoro Guide CV Midline PICC Right (01/30/2017); IR US Guide Vasc Access Right (01/30/2017); and IR Fluoro Guide CV Line Right (01/31/2017).  Family History: His family history includes Heart attack in his unknown relative; Heart disease in his paternal grandfather; Hyperlipidemia in his father; Thyroid disease in his father.  Social History: He  reports that he has never smoked. He has never used smokeless tobacco. He reports that he drinks alcohol. He reports that he does not use drugs.  Medications: Allergies as of 06/19/2018   No Known Allergies     Medication List        Accurate as of 06/19/18 12:27 PM. Always use your most recent med list.          albuterol 108 (90 Base) MCG/ACT inhaler Commonly known as:  PROVENTIL HFA;VENTOLIN HFA Inhale 2 puffs into the lungs every 6 (six) hours as needed for wheezing or shortness of breath.   azithromycin 250 MG tablet Commonly known as:  ZITHROMAX Take 1 tablet (250 mg total) by mouth daily. Take first 2 tablets together, then 1 every day until finished.   benzonatate 200 MG capsule Commonly known as:  TESSALON TAKE 1 CAPSULE BY MOUTH 3 TIMES DAILY AS NEEDED FOR COUGH   cefdinir 300 MG capsule Commonly known as:  OMNICEF Take 1 capsule (300 mg total) by mouth 2 (two) times daily.   cetirizine 10 MG tablet Commonly known as:  ZYRTEC Take 10 mg by mouth daily.   cholecalciferol 1000 units tablet Commonly known as:  VITAMIN D Take 1 tablet (1,000 Units total) by mouth daily.   Compressor/Nebulizer Misc Use as direccted   DULERA 200-5 MCG/ACT Aero Generic drug:  mometasone-formoterol INHALE 2 PUFFS INTO THE LUNGS 2 TIMES DAILY.   Fluticasone Propionate 93 MCG/ACT Exhu Place 2 sprays into the nose 2 (two) times daily.   fluticasone 50 MCG/ACT nasal spray Commonly  known as:  FLONASE INSTILL 2 SPRAYS INTO EACH NOSTRIL DAILY.   ipratropium 0.06 % nasal spray Commonly known as:  ATROVENT Place 2 sprays into both nostrils every 4 (four) hours as needed for rhinitis.   predniSONE 10 MG tablet Commonly known as:  DELTASONE Take 10 mg by mouth daily.   sulfamethoxazole-trimethoprim 800-160 MG tablet Commonly known as:  BACTRIM DS,SEPTRA DS   voriconazole 200 MG tablet Commonly known as:  VFEND Take 1 tablet (200 mg total) by mouth 2 (two) times daily.

## 2018-06-19 NOTE — Patient Instructions (Signed)
Will arrange for bronchoscopy and let you know about date and time when these are finalized  Follow up in 2 weeks with Dr. Maple HudsonYoung, Dr. Craige CottaSood, or Nurse Practitioner

## 2018-06-25 ENCOUNTER — Ambulatory Visit (HOSPITAL_COMMUNITY)
Admission: RE | Admit: 2018-06-25 | Discharge: 2018-06-25 | Disposition: A | Discharge status: Home / Self Care | Payer: 59 | Source: Ambulatory Visit | Attending: Pulmonary Disease | Admitting: Pulmonary Disease

## 2018-06-25 ENCOUNTER — Encounter (HOSPITAL_COMMUNITY)
Admission: RE | Disposition: A | Discharge status: Home / Self Care | Payer: Self-pay | Source: Ambulatory Visit | Attending: Pulmonary Disease

## 2018-06-25 ENCOUNTER — Encounter (HOSPITAL_COMMUNITY): Payer: Self-pay | Admitting: Respiratory Therapy

## 2018-06-25 DIAGNOSIS — R509 Fever, unspecified: Secondary | ICD-10-CM | POA: Insufficient documentation

## 2018-06-25 DIAGNOSIS — G629 Polyneuropathy, unspecified: Secondary | ICD-10-CM | POA: Insufficient documentation

## 2018-06-25 DIAGNOSIS — R918 Other nonspecific abnormal finding of lung field: Secondary | ICD-10-CM | POA: Diagnosis not present

## 2018-06-25 DIAGNOSIS — M301 Polyarteritis with lung involvement [Churg-Strauss]: Secondary | ICD-10-CM

## 2018-06-25 DIAGNOSIS — L4050 Arthropathic psoriasis, unspecified: Secondary | ICD-10-CM | POA: Diagnosis not present

## 2018-06-25 DIAGNOSIS — M3130 Wegener's granulomatosis without renal involvement: Secondary | ICD-10-CM | POA: Insufficient documentation

## 2018-06-25 DIAGNOSIS — Z79899 Other long term (current) drug therapy: Secondary | ICD-10-CM | POA: Diagnosis not present

## 2018-06-25 DIAGNOSIS — J45909 Unspecified asthma, uncomplicated: Secondary | ICD-10-CM | POA: Diagnosis not present

## 2018-06-25 DIAGNOSIS — R848 Other abnormal findings in specimens from respiratory organs and thorax: Secondary | ICD-10-CM | POA: Diagnosis not present

## 2018-06-25 HISTORY — PX: VIDEO BRONCHOSCOPY: SHX5072

## 2018-06-25 LAB — BODY FLUID CELL COUNT WITH DIFFERENTIAL
EOS FL: 1 %
LYMPHS FL: 5 %
Monocyte-Macrophage-Serous Fluid: 75 % (ref 50–90)
Neutrophil Count, Fluid: 19 % (ref 0–25)
Total Nucleated Cell Count, Fluid: 130 cu mm (ref 0–1000)

## 2018-06-25 SURGERY — VIDEO BRONCHOSCOPY WITHOUT FLUORO
Anesthesia: Moderate Sedation | Laterality: Bilateral

## 2018-06-25 MED ORDER — FENTANYL CITRATE (PF) 100 MCG/2ML IJ SOLN
INTRAMUSCULAR | Status: DC | PRN
  Administered 2018-06-25 (×4): 50 ug via INTRAVENOUS

## 2018-06-25 MED ORDER — LIDOCAINE HCL 2 % EX GEL
1.0000 "application " | Freq: Once | CUTANEOUS | Status: DC
  Filled 2018-06-25: qty 5

## 2018-06-25 MED ORDER — MIDAZOLAM HCL 5 MG/ML IJ SOLN
INTRAMUSCULAR | Status: AC
  Filled 2018-06-25: qty 2

## 2018-06-25 MED ORDER — SODIUM CHLORIDE 0.9 % IV SOLN
INTRAVENOUS | Status: DC
  Administered 2018-06-25: 14:00:00 via INTRAVENOUS

## 2018-06-25 MED ORDER — BUTAMBEN-TETRACAINE-BENZOCAINE 2-2-14 % EX AERO: 1.0000 | INHALATION_SPRAY | Freq: Once | CUTANEOUS | Status: DC

## 2018-06-25 MED ORDER — LIDOCAINE HCL 1 % IJ SOLN
INTRAMUSCULAR | Status: DC | PRN
  Administered 2018-06-25: 6 mL via RESPIRATORY_TRACT

## 2018-06-25 MED ORDER — PHENYLEPHRINE HCL 0.25 % NA SOLN: 1.0000 | Freq: Four times a day (QID) | NASAL | Status: DC | PRN

## 2018-06-25 MED ORDER — FENTANYL CITRATE (PF) 100 MCG/2ML IJ SOLN
INTRAMUSCULAR | Status: AC
  Filled 2018-06-25: qty 4

## 2018-06-25 MED ORDER — MIDAZOLAM HCL 10 MG/2ML IJ SOLN
INTRAMUSCULAR | Status: DC | PRN
  Administered 2018-06-25: 2 mg via INTRAVENOUS
  Administered 2018-06-25: 1 mg via INTRAVENOUS
  Administered 2018-06-25 (×2): 2 mg via INTRAVENOUS

## 2018-06-25 NOTE — Progress Notes (Signed)
Video Bronchoscopy Intervention Bronchial washing Intervention Bronchial brushing Procedure tolerated well

## 2018-06-25 NOTE — H&P (Signed)
[place] [facility]  CC: Intermittent Fever  Constitutional: BP 110/60, Pulse 61, Ht _0 , Wt 176 lbs, SpO2 98%, BMI 19.83  History of Present Illness: Jesse Wong is a 37 y.o. 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  He is followed by Dr. Annamaria Boots.  He has been treated with rituxan administered by Dr. [name] with rheumatology and steroids.  He has been off therapy recently due to concern about respiratory infection.  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    He is followed by Dr. Linus Salmons with ID.  He was having recurrent fevers.  He has tree in bud infiltrates on chest imaging.  He had sputum culture from March 2019.  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  Fevers improved some.  His f/u CT chest showed he still had persistent infiltrates, and it was felt he needed more direct airway sampling with bronchoscopy.  He has chronic sinus congestion.  He has cough with clear to yellow sputum.  He has joint pain and skin peeling when he is not on prednisone.  He is not having abdominal pain, reflux, diarrhea, or leg swelling.   Comprehensive Respiratory Exam:  Appearance - well kempt  ENMT - nasal mucosa moist, turbinates clear, midline nasal septum, no dental lesions, no gingival bleeding, no oral exudates, no tonsillar hypertrophy Neck - no masses, trachea midline, no thyromegaly, no elevation in JVP Respiratory - normal appearance of chest wall, normal respiratory effort w/o accessory muscle use, no dullness on percussion, no tactile fremitus, no wheezing or rales CV - s1s2 regular rate and rhythm, no murmurs, no peripheral edema, no varicosities, radial pulses symmetric GI - soft, non tender, no masses, no hepatosplenomegaly Lymph - no adenopathy noted in neck and axillary areas MSK - normal  muscle strength and tone, normal gait Ext - no cyanosis, clubbing, or joint inflammation noted Skin - no rashes, lesions, or ulcers Neuro - oriented to person, place, and time Psych - normal mood and affect  Discussion: He has persistent tree in bud infiltrates on chest imaging studies associated with intermittent fever and productive cough.  This is in the setting of P-ANCA positive vasculitis.  He was previously on therapy with rituxan and prednisone, but has been off therapy recently due to concern about respiratory infection.  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  He needs to have bronchoscopy with airway sampling.  Assessment/Plan:  Abnormal CT chest with tree in bud infiltrates. - will arrange for bronchoscopy for 2 pm on 06/25/18 at [facility] - risks detailed as bleeding, pneumothorax, infection, and non diagnosis - he is agreeable to proceed - will plan to perform BAL and send for bacterial, AFB, and fungal cultures   [name], MD [place] 06/25/2018, 1:28 PM  Flow Sheet  Pulmonary tests: PFT 05/09/10 >> FEV1 4.19 (83%), FEV1% 85, TLC 7.22 (78%), DLCO 113%, no BD Quantiferon gold 01/15/18 >> negative CT chest 06/01/18 (reviewed by me) >> tree in bud in upper lobes, mild cylindrical BTX, 1.5 cm nodule RLL  Review of systems: Reviewed and negative except in HPI  Past Medical History: He  has a past medical history of Allergy, Arthritis, Asthma, Chronic prostatitis, Myalgia, Pulmonary infiltrates, Sinusitis, chronic, Tonsillar abscess, and Vasculitis ([place]).  Past Surgical History: He  has a past surgical history that includes Tympanoplasty; peritonsilar abscess; Bronchoscopy (2011); IR Fluoro Guide CV Midline PICC Right (01/30/2017); IR US Guide Vasc Access Right (01/30/2017); and IR Fluoro Guide CV Line Right (01/31/2017).  Family History: His family history includes  Heart attack in his unknown relative; Heart disease in his paternal grandfather; Hyperlipidemia in his father; Thyroid disease in his father.  Social History: He  reports that he has never smoked. He has never used smokeless tobacco. He reports that he drinks alcohol. He reports that he does not use drugs.  Medications: Allergies as of 06/25/2018   No Known Allergies     No current facility-administered medications on file prior to encounter.    Current Outpatient Medications on File Prior to Encounter  Medication Sig Dispense Refill  . albuterol (VENTOLIN HFA) 108 (90 Base) MCG/ACT inhaler Inhale 2 puffs into the lungs every 6 (six) hours as needed for wheezing or shortness of breath. 1 Inhaler 5  . benzonatate (TESSALON) 200 MG capsule TAKE 1 CAPSULE BY MOUTH 3 TIMES DAILY AS NEEDED FOR COUGH (Patient taking differently: Take 200 mg by mouth daily. ) 30 capsule 5  . cetirizine (ZYRTEC) 10 MG tablet Take 10 mg by mouth daily.    . cholecalciferol (VITAMIN D) 1000 UNITS tablet Take 1 tablet (1,000 Units total) by mouth daily. 30 tablet 6  . DULERA 200-5 MCG/ACT AERO INHALE 2 PUFFS INTO THE LUNGS 2 TIMES DAILY. (Patient taking differently: Inhale 2 puffs into the lungs 2 (two) times daily. ) 13 g 11  . fluticasone (FLONASE) 50 MCG/ACT nasal spray INSTILL 2 SPRAYS INTO EACH NOSTRIL DAILY. (Patient taking differently: Place 2 sprays into both nostrils daily. INSTILL 2 SPRAYS INTO EACH NOSTRIL DAILY.) 16 g 3  . predniSONE (DELTASONE) 10 MG tablet Take 10 mg by mouth daily.  0  . azithromycin (ZITHROMAX) 250 MG tablet Take 1 tablet (250 mg total) by mouth daily. Take first 2 tablets together, then 1 every day until finished. (Patient not taking: Reported on 06/19/2018) 6 tablet 0  . cefdinir (OMNICEF) 300 MG capsule Take 1 capsule (300 mg total) by mouth 2 (two) times daily. (Patient not taking: Reported on 06/19/2018) 14 capsule 0  . Fluticasone Propionate (XHANCE) 93 MCG/ACT EXHU Place 2 sprays into  the nose 2 (two) times daily. (Patient not taking: Reported on 06/19/2018) 16 mL 12  . ipratropium (ATROVENT) 0.06 % nasal spray Place 2 sprays into both nostrils every 4 (four) hours as needed for rhinitis. (Patient not taking: Reported on 06/19/2018) 10 mL 6  . Nebulizers (COMPRESSOR/NEBULIZER) MISC Use as direccted 1 each 0  . voriconazole (VFEND) 200 MG tablet Take 1 tablet (200 mg total) by mouth 2 (two) times daily. (Patient not taking: Reported on 06/19/2018) 60 tablet 1

## 2018-06-25 NOTE — Discharge Instructions (Signed)
Flexible Bronchoscopy, Care After These instructions give you information on caring for yourself after your procedure. Your doctor may also give you more specific instructions. Call your doctor if you have any problems or questions after your procedure. Follow these instructions at home:  Do not eat or drink anything for 2 hours after your procedure. If you try to eat or drink before the medicine wears off, food or drink could go into your lungs. You could also burn yourself.  After 2 hours have passed and when you can cough and gag normally, you may eat soft food and drink liquids slowly.  The day after the test, you may eat your normal diet.  You may do your normal activities.  Keep all doctor visits. Get help right away if:  You get more and more short of breath.  You get light-headed.  You feel like you are going to pass out (faint).  You have chest pain.  You have new problems that worry you.  You cough up more than a little blood.  You cough up more blood than before. This information is not intended to replace advice given to you by your health care provider. Make sure you discuss any questions you have with your health care provider. Document Released: 08/04/2009 Document Revised: 03/14/2016 Document Reviewed: 06/11/2013 Elsevier Interactive Patient Education  2017 [employer].  Nothing to eat or drink until 4:30 pm today 06/25/2018 Any questions or concerns please call the office at [phone]

## 2018-06-25 NOTE — Procedures (Signed)
Bronchoscopy Procedure Note Jesse Wong [id number] [birth year]  Procedure: Bronchoscopy Indications: 37 yo male with hx of Wegener's with recurrent fever and tree-in-bud pulmonary infiltrates.  Procedure Details Consent: Risks of procedure as well as the alternatives and risks of each were explained to the (patient/caregiver).  Consent for procedure obtained. Time Out: Verified patient identification, verified procedure, site/side was marked, verified correct patient position, special equipment/implants available, medications/allergies/relevent history reviewed, required imaging and test results available.  Performed  Given 7 mg versed, 200 mcg fentanyl for sedation and analgesia.  Administered by bronchoscopist.  Sedation time 14 minutes.  Bronchoscope entered through mouth.  Vocal cords visualized with normal movement.  Instilled 8 ml of lidocaine for topical anesthesia of airways.  Advanced bronchoscope into trachea and advanced to carina.  Then entered left main bronchus.  Left upper, lingular, and lower lobes visualized.  No endobronchial lesions and mucosa had normal appearance.  Then entered right main bronchus.  Right upper, middle, and lower lobes visualized.  No endobronchial lesions and mucosa had normal appearance.  Wedged bronchoscope into apical segment of right upper lobe.  Instilled 100 ml of saline with 35 ml of white, cloudy fluid returned.  Then inserted bronchial brush into right upper lobe for brush cytology specimen.  Bronchoscope then withdrawn.  No immediate complications.  No blood loss.  Hemodynamics and oxygenation stable during entire procedure.  Plan - BAL from right upper lobe for gram stain and culture, AFB stain and culture, fungal stain and culture, aspergillus antigen, cytology, cell count with differentila - bronchial brushing from right upper lobe for cytology  Updated pt's wife about findings after procedure completed.   [name],  MD [facility] 06/25/2018, 2:42 PM

## 2018-06-26 ENCOUNTER — Encounter (HOSPITAL_COMMUNITY): Payer: Self-pay | Admitting: Pulmonary Disease

## 2018-06-26 LAB — ASPERGILLUS ANTIGEN, BAL/SERUM: Aspergillus Ag, BAL/Serum: 0.1 Index (ref 0.00–0.49)

## 2018-06-26 LAB — ACID FAST SMEAR (AFB, MYCOBACTERIA): Acid Fast Smear: NEGATIVE

## 2018-06-27 LAB — CULTURE, BAL-QUANTITATIVE W GRAM STAIN: Culture: 80000 — AB

## 2018-06-27 LAB — CULTURE, BAL-QUANTITATIVE

## 2018-06-30 ENCOUNTER — Telehealth: Payer: Self-pay | Admitting: Pulmonary Disease

## 2018-06-30 NOTE — Telephone Encounter (Signed)
BAL cytology 06/25/18 >> negative BAL Aspergillus Ag 06/25/18 >> negative BAL culture 06/25/18 >> oral flora BAL AFB and fungal cultures pending BAL cell count 06/25/18 >> WBC 130 (19% N, 5% L, 75% M, 1% E)   Please let him know that his bronchoscopy results have been negative to date.  The AFB and fungal cultures can take several more weeks to be finalized.    Will discuss in more detail at his ROV with Lazaro Arms on 07/03/18.

## 2018-06-30 NOTE — Telephone Encounter (Signed)
Called and spoke with patient regarding results.  Informed the patient of results and recommendations today. Pt verbalized understanding and denied any questions or concerns at this time.  Nothing further needed.  

## 2018-07-01 ENCOUNTER — Other Ambulatory Visit: Payer: Self-pay | Admitting: Internal Medicine

## 2018-07-01 MED FILL — BENZONATATE 200 MG CAPS: 200 | 10 days supply | Qty: 30 | Fill #0 | Status: TO

## 2018-07-01 MED FILL — FLUTICASONE PROP 50 MCG SPR: 50 | 30 days supply | Qty: 16 | Fill #2

## 2018-07-03 ENCOUNTER — Encounter: Payer: Self-pay | Admitting: Nurse Practitioner

## 2018-07-03 ENCOUNTER — Ambulatory Visit: Payer: 59 | Admitting: Nurse Practitioner

## 2018-07-03 DIAGNOSIS — R918 Other nonspecific abnormal finding of lung field: Secondary | ICD-10-CM

## 2018-07-03 NOTE — Assessment & Plan Note (Signed)
Patient Instructions  Patient is stable after bronch Still awaiting complete lab results from bronch Will call with results Continue current medications Please call if symptoms worsen

## 2018-07-03 NOTE — Progress Notes (Signed)
@Patient  ID: Jesse KyleBenjamin J Wong, male    DOB: 1980/11/19, 37 y.o.   MRN: [id number]  Chief Complaint  Patient presents with  . Follow-up    Referring provider: Agapito GamesMetheney, Catherine D, *  HPI  37 year old male with Wegener's Granulomatosis, asthma, and psoriatic arthritis followed by Dr. Maple HudsonYoung.  Patient presents for follow up after recent bronch. 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 He has had persistent tree in bud infiltrates on CT with fevers and productive cough in the setting of P-ANCA positive vasculitis. Dr. Craige CottaSood performed a Bronch for further evaluation. Patient states that he has been doing well since the procedure. He still has intermittent fevers. He denies any shortness of breath or chest pain.    No Known Allergies  Immunization History  Administered Date(s) Administered  . DTaP 07/06/2007  . Influenza Split 07/26/2015, 07/21/2017  . Influenza Whole 09/04/2009  . Influenza-Unspecified 07/05/2013, 07/22/2014  . Pneumococcal Polysaccharide-23 03/11/2018  . Td 01/20/2008  . Tdap 01/25/2017    Past Medical History:  Diagnosis Date  . Allergy   . Arthritis   . Asthma   . Chronic prostatitis   . Myalgia   . Pulmonary infiltrates   . Sinusitis, chronic   . Tonsillar abscess   . Vasculitis ([facility])     Tobacco History: Social History   Tobacco Use  Smoking Status Never Smoker  Smokeless Tobacco Never Used   Counseling given: Yes   Outpatient Encounter Medications as of 07/03/2018  Medication Sig  . albuterol (VENTOLIN HFA) 108 (90 Base) MCG/ACT inhaler Inhale 2 puffs into the lungs every 6 (six) hours as needed for wheezing or shortness of breath.  . benzonatate (TESSALON) 200 MG capsule TAKE 1 CAPSULE BY MOUTH 3 TIMES DAILY AS NEEDED FOR COUGH  . cetirizine (ZYRTEC) 10 MG tablet Take 10 mg by mouth daily.  . cholecalciferol (VITAMIN D) 1000 UNITS tablet Take 1 tablet (1,000 Units total) by mouth daily.  . DULERA 200-5 MCG/ACT AERO  INHALE 2 PUFFS INTO THE LUNGS 2 TIMES DAILY. (Patient taking differently: Inhale 2 puffs into the lungs 2 (two) times daily. )  . fluticasone (FLONASE) 50 MCG/ACT nasal spray INSTILL 2 SPRAYS INTO EACH NOSTRIL DAILY. (Patient taking differently: Place 2 sprays into both nostrils daily. INSTILL 2 SPRAYS INTO EACH NOSTRIL DAILY.)  . Fluticasone Propionate (XHANCE) 93 MCG/ACT EXHU Place 2 sprays into the nose 2 (two) times daily.  [name]. ipratropium (ATROVENT) 0.06 % nasal spray Place 2 sprays into both nostrils every 4 (four) hours as needed for rhinitis.  . Nebulizers (COMPRESSOR/NEBULIZER) MISC Use as direccted  . predniSONE (DELTASONE) 10 MG tablet Take 10 mg by mouth daily.  . [DISCONTINUED] azithromycin (ZITHROMAX) 250 MG tablet Take 1 tablet (250 mg total) by mouth daily. Take first 2 tablets together, then 1 every day until finished. (Patient not taking: Reported on 07/03/2018)  . [DISCONTINUED] cefdinir (OMNICEF) 300 MG capsule Take 1 capsule (300 mg total) by mouth 2 (two) times daily. (Patient not taking: Reported on 07/03/2018)  . [DISCONTINUED] voriconazole (VFEND) 200 MG tablet Take 1 tablet (200 mg total) by mouth 2 (two) times daily. (Patient not taking: Reported on 07/03/2018)   No facility-administered encounter medications on file as of 07/03/2018.      Review of Systems  Review of Systems  Constitutional: Positive for fever.  HENT: Negative.   Respiratory: Positive for cough. Negative for shortness of breath.   Cardiovascular: Negative.  Negative for chest pain, palpitations  and leg swelling.  Gastrointestinal: Negative.   Allergic/Immunologic: Negative.   Neurological: Negative.   Psychiatric/Behavioral: Negative.        Physical Exam  BP 118/78 (BP Location: Left Arm, Patient Position: Sitting, Cuff Size: Normal)   Pulse 69   Ht 6\' 7"  (2.007 m)   Wt 171 lb 9.6 oz (77.8 kg)   SpO2 97%   BMI 19.33 kg/m   Wt Readings from Last 5 Encounters:  07/03/18 171 lb 9.6 oz (77.8  kg)  06/19/18 176 lb (79.8 kg)  06/10/18 174 lb (78.9 kg)  05/04/18 172 lb (78 kg)  04/22/18 172 lb (78 kg)     Physical Exam  Constitutional: He is oriented to person, place, and time. He appears well-developed and well-nourished. No distress.  Cardiovascular: Normal rate and regular rhythm.  Pulmonary/Chest: Effort normal and breath sounds normal.  Neurological: He is alert and oriented to person, place, and time.  Skin: Skin is warm and dry.  Psychiatric: He has a normal mood and affect.  Nursing note and vitals reviewed.    Lab Results:  CBC    Component Value Date/Time   WBC 6.0 12/16/2017 1050   RBC 5.06 12/16/2017 1050   HGB 16.1 12/16/2017 1050   HCT 45.9 12/16/2017 1050   PLT 326 12/16/2017 1050   MCV 90.7 12/16/2017 1050   MCH 31.8 12/16/2017 1050   MCHC 35.1 12/16/2017 1050   RDW 12.2 12/16/2017 1050   LYMPHSABS 1.3 12/16/2017 1050   MONOABS 0.5 12/16/2017 1050   EOSABS 0.3 12/16/2017 1050   BASOSABS 0.0 12/16/2017 1050    BMET    Component Value Date/Time   NA 140 03/03/2018 1608   NA 145 03/03/2017   K 4.3 03/03/2018 1608   CL 103 03/03/2018 1608   CL 95 02/03/2017   CO2 31 03/03/2018 1608   CO2 25 02/03/2017   GLUCOSE 89 03/03/2018 1608   BUN 13 03/03/2018 1608   BUN 16 03/03/2017   CREATININE 1.07 03/03/2018 1608   CALCIUM 9.6 03/03/2018 1608   CALCIUM 9.7 02/03/2017   GFRNONAA 88 03/03/2018 1608   GFRAA 102 03/03/2018 1608     Assessment & Plan:   Pulmonary infiltrates Patient Instructions  Patient is stable after bronch Still awaiting complete lab results from bronch Will call with results Continue current medications Please call if symptoms worsen       Ivonne Andrew, NP 07/03/2018

## 2018-07-03 NOTE — Patient Instructions (Signed)
Patient is stable Still awaiting complete lab results Please call if symptoms worsen

## 2018-07-07 NOTE — Progress Notes (Signed)
Reviewed and agree with assessment/plan.   Hosanna Betley, MD [place] [facility] 10/16/2016, 12:24 PM Pager:  [phone]  

## 2018-07-25 LAB — FUNGUS CULTURE RESULT

## 2018-07-25 LAB — FUNGAL ORGANISM REFLEX

## 2018-07-25 LAB — FUNGUS CULTURE WITH STAIN

## 2018-08-08 LAB — ACID FAST CULTURE WITH REFLEXED SENSITIVITIES: ACID FAST CULTURE - AFSCU3: NEGATIVE

## 2018-08-10 NOTE — Telephone Encounter (Signed)
Dr. Sood - please advise. Thanks. 

## 2018-08-11 ENCOUNTER — Other Ambulatory Visit: Payer: Self-pay | Admitting: Family Medicine

## 2018-08-11 MED FILL — FLUTICASONE PROP 50 MCG SPR: 50 | 30 days supply | Qty: 16 | Fill #3

## 2018-09-09 DIAGNOSIS — B449 Aspergillosis, unspecified: Secondary | ICD-10-CM | POA: Diagnosis not present

## 2018-09-09 DIAGNOSIS — L4059 Other psoriatic arthropathy: Secondary | ICD-10-CM | POA: Diagnosis not present

## 2018-09-09 DIAGNOSIS — M317 Microscopic polyangiitis: Secondary | ICD-10-CM | POA: Diagnosis not present

## 2018-09-09 DIAGNOSIS — Z7952 Long term (current) use of systemic steroids: Secondary | ICD-10-CM | POA: Diagnosis not present

## 2018-09-09 DIAGNOSIS — Z6820 Body mass index (BMI) 20.0-20.9, adult: Secondary | ICD-10-CM | POA: Diagnosis not present

## 2018-09-14 NOTE — Telephone Encounter (Signed)
CY please advise on patients e-mail that has been attached below. Thank you.

## 2018-09-14 NOTE — Telephone Encounter (Signed)
The results from bronchoscopy all seem to be negative, with no identified infection. That makes it likely that the lung changes we are seeing are caused by the underlying granulomatous lung disease.  An open (surgical) lung biopsy could be considered if you keep getting worse.  For now, I would go back to your Rheumatologist to plan next strategy.

## 2018-09-15 NOTE — Telephone Encounter (Signed)
Dr. Maple HudsonYoung, Please see below mychart message from pt and advise on recs. Thanks!  Dr. Maple HudsonYoung,  in dicussing this with my rheumatologist, he is concerned about further treatment for vasculitis in case this is an opportunistic infection caused by immunosupression. He recommends we consider getting the opinion of a pulmonologist that specializes in atypical infection and/or vasculitis. Do you know of anyone in the region like this? He thinks there is someone at [facility] at may have experience in both areas. Thanks, BJ

## 2018-09-29 MED FILL — FLUTICASONE PROP 50 MCG SPR: 50 | 90 days supply | Qty: 48 | Fill #0

## 2018-09-29 MED FILL — BENZONATATE 200 MG CAPS: 200 | 10 days supply | Qty: 30 | Fill #1 | Status: TO

## 2018-10-11 MED FILL — IPRATROPIUM 0.06% SPRAY: 0.06 | 7 days supply | Qty: 15 | Fill #1

## 2018-10-19 NOTE — Telephone Encounter (Signed)
What we have in [place] are the Infectious Disease specialists, who are not pulmonologists. If Jesse Wong's rheumatologist knows someone whose opinion he thinks would help, it would be fine for him to refer for another opinion.

## 2018-12-07 ENCOUNTER — Ambulatory Visit: Payer: 59 | Admitting: Internal Medicine

## 2018-12-07 ENCOUNTER — Encounter: Payer: Self-pay | Admitting: Internal Medicine

## 2018-12-07 VITALS — BP 114/70 | HR 62 | Ht 79.0 in | Wt 176.4 lb

## 2018-12-07 DIAGNOSIS — M3130 Wegener's granulomatosis without renal involvement: Secondary | ICD-10-CM

## 2018-12-07 DIAGNOSIS — B449 Aspergillosis, unspecified: Secondary | ICD-10-CM | POA: Diagnosis not present

## 2018-12-07 DIAGNOSIS — R911 Solitary pulmonary nodule: Secondary | ICD-10-CM

## 2018-12-07 DIAGNOSIS — J324 Chronic pansinusitis: Secondary | ICD-10-CM

## 2018-12-07 DIAGNOSIS — R05 Cough: Secondary | ICD-10-CM

## 2018-12-07 DIAGNOSIS — R059 Cough, unspecified: Secondary | ICD-10-CM

## 2018-12-07 NOTE — Assessment & Plan Note (Addendum)
Aspergillus antigen was very low on BAL done in September, 2019. And fungal cultures were negative.

## 2018-12-07 NOTE — Assessment & Plan Note (Signed)
Sinus symptoms are now inactive and he seems to be doing well.

## 2018-12-07 NOTE — Patient Instructions (Addendum)
Order- schedule PFT  Dx  Asthmatic Bronchitis,   Order- schedule CT chest Hi Res   Dx Granulomatous Bronchitis/ Wegener's  Please call as needed

## 2018-12-07 NOTE — Assessment & Plan Note (Signed)
He is currently off of immunosuppression.  I am very pleased that he is symptomatically so well.  We are going to update baseline status now. Plan-schedule PFT, CT high resolution without contrast. If we saw progression then we discussed option of referral to a national center.

## 2018-12-07 NOTE — Assessment & Plan Note (Signed)
We will update with CT chest as planned

## 2018-12-07 NOTE — Progress Notes (Signed)
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 Presented 2011 with nodular infiltrates and progressive weakness/neuropathy. Bronchoscoped 2011- Neg.  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 FVC 4.91/73%, FEV1 4.19/83%, FEV1/FVC 0.85, FEF 25-75% 0.85, TLC 78%, DLCO 113% PFT 06/08/2014-normal lung volumes, minimal slowing and small airway flows with response to bronchodilator, normal diffusion CXR 08/30/2014-Emphysematous and bronchitic changes consistent with COPD. Bronchoscopy 2019-Dr. Sood-BAL-normal flora.  Aspergillus Ag BAL 0.10 WNL, fungal cultures negative, AFB neg. cytology benign. 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 Presented 2011 with nodular infiltrates and progressive weakness/neuropathy. ----Wegeners: Pt states he uses Albuterol HFA as needed. States since getting over a cold/ ? flu he is feeling back to base line.  Dulera 200, Albuterol HFA, nebulizer Uses Dulera and rescue inhaler if needed, typically with viral triggered respiratory exacerbations.  Flulike illness last month now resolved.  He flies frequently and eustachian dysfunction can be a problem.  Daily Flonase provides marginal control-always nasal stuffiness.  Mild persistent dry cough.  Benzonatate helps when needed.  Denies  shortness of breath.  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 Presented 2011 with nodular infiltrates and progressive weakness/neuropathy. Bronchoscopy 2019-Dr. Sood-BAL-normal flora.  Aspergillus Ag BAL 0.10 WNL, fungal cultures negative, AGB neg, cytology benign. -----Pt states he has been doing well since last visit and denies any complaints. Albuterol HFA, Tessalon Perles, Dulera 200, Flonase, Xhance, Atrovent 0.06% nasal, prednisone 10 mg daily He has been feeling very well.  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  He does a lot of singing and finds he can take a deeper breath and hold notes better than he has in a long time. His rheumatologist stopped rituxan and, uncomfortable with the possibility that he might have an infection.  He uses prednisone only during exacerbations.  We talked about referral options if he had progressive disease-[facility], [facility].  We discussed possibility of open biopsy, but this also would be a consideration only if he were showing progressive deterioration. We discussed the results of his bronchoscopy with BAL, finding only normal flora. CT max face limited 01/13/2018- IMPRESSION: Mild chronic mucosal thickening throughout the paranasal sinuses, similar in degree to the prior study. No fluid. Chest CT 06/01/2018-  IMPRESSION: 1. Mild bilateral tree-in-bud opacities again noted, slightly increased in some portions of the lungs. This is compatible with infection, including atypical mycobacterium. 2. Stable 1 x 1.5 cm RIGHT LOWER lobe nodule, likely benign. Consider CT follow-up in 18-24 months  ROS-see HPI + = positive Constitutional:   No-   weight loss, night sweats, fevers, chills, fatigue, lassitude. HEENT:   No-  headaches, difficulty  swallowing, tooth/dental problems, sore throat,       No-  sneezing, itching, +ear ache,+ nasal congestion, post nasal drip,  CV:  No-   chest pain, orthopnea, PND, swelling in lower extremities, anasarca,  dizziness, palpitations Resp: shortness of breath with exertion or at rest.              No-   productive cough,  + non-productive cough,  No- coughing up of blood.              No-   change in color of mucus.  + wheezing.   Skin: No-   rash or lesions. GI:  No-   heartburn, indigestion, abdominal pain, nausea, vomiting,  GU:  MS:  No-   joint pain or swelling.   Neuro-     nothing unusual Psych:  No- change in mood or affect. No depression or anxiety.  No memory loss.  OBJ- Physical Exam General- Alert, Oriented, Affect-appropriate, Distress- none acute, tall, thin, + looks well today Skin- no rash Lymphadenopathy- none Head- atraumatic            Eyes- Gross vision intact, PERRLA, conjunctivae and secretions clear            Ears- Hearing, canals-normal            Nose- + marked turbinate edema, no-Septal dev, mucus, polyps, erosion, perforation             Throat- Mallampati II , mucosa clear , drainage- none, tonsils- atrophic Neck- flexible , trachea midline, no stridor , thyroid nl, carotid no bruit Chest - symmetrical excursion , unlabored           Heart/CV- RRR , no murmur , no gallop  , no rub, nl s1 s2                           - JVD- none , edema- none, stasis changes- none, varices- none           Lung-  wheeze-none, cough-none , dullness-none, rub- none,            Chest wall-  Abd-  Br/ Gen/ Rectal- Not done, not indicated Extrem- cyanosis- none, clubbing, none, atrophy- none, strength- nl Neuro- grossly intact to observation

## 2018-12-16 ENCOUNTER — Ambulatory Visit (HOSPITAL_COMMUNITY): Payer: 59

## 2018-12-20 MED FILL — BENZONATATE 200 MG CAPS: 200 | 10 days supply | Qty: 30 | Fill #2 | Status: TO

## 2019-01-07 ENCOUNTER — Telehealth: Payer: Self-pay | Admitting: Internal Medicine

## 2019-01-07 NOTE — Telephone Encounter (Signed)
Called and spoke with [place]. She is aware and verbalized understanding. Nothing further needed.

## 2019-01-07 NOTE — Telephone Encounter (Signed)
Ok

## 2019-01-07 NOTE — Telephone Encounter (Signed)
Recommend that CT be done now, not deferred.

## 2019-01-07 NOTE — Telephone Encounter (Signed)
Jesse Wong, [facility] CT Cb is [phone].

## 2019-01-07 NOTE — Telephone Encounter (Signed)
CY please advise if it is ok to have the patient wait for his CT scan 4-6 weeks or if it needs to be done now. Thank you.

## 2019-01-07 NOTE — Telephone Encounter (Signed)
Forwarding to CDY to be aware that the pt cancelled his ct and has not yet rescheduled it

## 2019-01-08 ENCOUNTER — Ambulatory Visit (HOSPITAL_COMMUNITY): Payer: 59

## 2019-01-28 MED FILL — FLUTICASONE PROP 50 MCG SPR: 50 | 30 days supply | Qty: 16 | Fill #1

## 2019-03-03 DIAGNOSIS — Z7952 Long term (current) use of systemic steroids: Secondary | ICD-10-CM | POA: Diagnosis not present

## 2019-03-03 DIAGNOSIS — L4059 Other psoriatic arthropathy: Secondary | ICD-10-CM | POA: Diagnosis not present

## 2019-03-03 DIAGNOSIS — M317 Microscopic polyangiitis: Secondary | ICD-10-CM | POA: Diagnosis not present

## 2019-03-03 DIAGNOSIS — B449 Aspergillosis, unspecified: Secondary | ICD-10-CM | POA: Diagnosis not present

## 2019-03-10 ENCOUNTER — Other Ambulatory Visit: Payer: Self-pay

## 2019-03-10 ENCOUNTER — Ambulatory Visit (INDEPENDENT_AMBULATORY_CARE_PROVIDER_SITE_OTHER): Payer: 59

## 2019-03-10 DIAGNOSIS — R918 Other nonspecific abnormal finding of lung field: Secondary | ICD-10-CM | POA: Diagnosis not present

## 2019-03-10 DIAGNOSIS — I2510 Atherosclerotic heart disease of native coronary artery without angina pectoris: Secondary | ICD-10-CM | POA: Diagnosis not present

## 2019-03-10 DIAGNOSIS — R05 Cough: Secondary | ICD-10-CM

## 2019-03-10 DIAGNOSIS — R059 Cough, unspecified: Secondary | ICD-10-CM

## 2019-03-16 ENCOUNTER — Other Ambulatory Visit: Payer: Self-pay | Admitting: Family Medicine

## 2019-03-16 MED FILL — FLUTICASONE PROP 50 MCG SPR: 50 | 30 days supply | Qty: 16 | Fill #0

## 2019-03-17 ENCOUNTER — Encounter: Payer: Self-pay | Admitting: Internal Medicine

## 2019-03-17 DIAGNOSIS — R05 Cough: Secondary | ICD-10-CM | POA: Diagnosis not present

## 2019-04-02 NOTE — Progress Notes (Signed)
This communication has been printed and sent to the pt's verified address 03/17/2019. Nothing further needed at this time.

## 2019-04-12 ENCOUNTER — Ambulatory Visit (INDEPENDENT_AMBULATORY_CARE_PROVIDER_SITE_OTHER): Payer: 59 | Admitting: Family Medicine

## 2019-04-12 ENCOUNTER — Encounter: Payer: Self-pay | Admitting: Family Medicine

## 2019-04-12 VITALS — BP 108/62 | HR 66 | Ht 79.0 in | Wt 181.0 lb

## 2019-04-12 DIAGNOSIS — I700 Atherosclerosis of aorta: Secondary | ICD-10-CM

## 2019-04-12 DIAGNOSIS — Z0000 Encounter for general adult medical examination without abnormal findings: Secondary | ICD-10-CM

## 2019-04-12 NOTE — Patient Instructions (Signed)
Preventive Care 39-64 Years, Male Preventive care refers to lifestyle choices and visits with your health care provider that can promote health and wellness. What does preventive care include?   A yearly physical exam. This is also called an annual well check.  Dental exams once or twice a year.  Routine eye exams. Ask your health care provider how often you should have your eyes checked.  Personal lifestyle choices, including: ? Daily care of your teeth and gums. ? Regular physical activity. ? Eating a healthy diet. ? Avoiding tobacco and drug use. ? Limiting alcohol use. ? Practicing safe sex. ? Taking low-dose aspirin every day starting at age 50. What happens during an annual well check? The services and screenings done by your health care provider during your annual well check will depend on your age, overall health, lifestyle risk factors, and family history of disease. Counseling Your health care provider may ask you questions about your:  Alcohol use.  Tobacco use.  Drug use.  Emotional well-being.  Home and relationship well-being.  Sexual activity.  Eating habits.  Work and work environment. Screening You may have the following tests or measurements:  Height, weight, and BMI.  Blood pressure.  Lipid and cholesterol levels. These may be checked every 5 years, or more frequently if you are over 50 years old.  Skin check.  Lung cancer screening. You may have this screening every year starting at age 55 if you have a 30-pack-year history of smoking and currently smoke or have quit within the past 15 years.  Colorectal cancer screening. All adults should have this screening starting at age 50 and continuing until age 75. Your health care provider may recommend screening at age 45. You will have tests every 1-10 years, depending on your results and the type of screening test. People at increased risk should start screening at an earlier age. Screening tests may  include: ? Guaiac-based fecal occult blood testing. ? Fecal immunochemical test (FIT). ? Stool DNA test. ? Virtual colonoscopy. ? Sigmoidoscopy. During this test, a flexible tube with a tiny camera (sigmoidoscope) is used to examine your rectum and lower colon. The sigmoidoscope is inserted through your anus into your rectum and lower colon. ? Colonoscopy. During this test, a long, thin, flexible tube with a tiny camera (colonoscope) is used to examine your entire colon and rectum.  Prostate cancer screening. Recommendations will vary depending on your family history and other risks.  Hepatitis C blood test.  Hepatitis B blood test.  Sexually transmitted disease (STD) testing.  Diabetes screening. This is done by checking your blood sugar (glucose) after you have not eaten for a while (fasting). You may have this done every 1-3 years. Discuss your test results, treatment options, and if necessary, the need for more tests with your health care provider. Vaccines Your health care provider may recommend certain vaccines, such as:  Influenza vaccine. This is recommended every year.  Tetanus, diphtheria, and acellular pertussis (Tdap, Td) vaccine. You may need a Td booster every 10 years.  Varicella vaccine. You may need this if you have not been vaccinated.  Zoster vaccine. You may need this after age 60.  Measles, mumps, and rubella (MMR) vaccine. You may need at least one dose of MMR if you were born in 1957 or later. You may also need a second dose.  Pneumococcal 13-valent conjugate (PCV13) vaccine. You may need this if you have certain conditions and have not been vaccinated.  Pneumococcal polysaccharide (PPSV23) vaccine.   You may need one or two doses if you smoke cigarettes or if you have certain conditions.  Meningococcal vaccine. You may need this if you have certain conditions.  Hepatitis A vaccine. You may need this if you have certain conditions or if you travel or work in  places where you may be exposed to hepatitis A.  Hepatitis B vaccine. You may need this if you have certain conditions or if you travel or work in places where you may be exposed to hepatitis B.  Haemophilus influenzae type b (Hib) vaccine. You may need this if you have certain risk factors. Talk to your health care provider about which screenings and vaccines you need and how often you need them. This information is not intended to replace advice given to you by your health care provider. Make sure you discuss any questions you have with your health care provider. Document Released: 11/03/2015 Document Revised: 11/27/2017 Document Reviewed: 08/08/2015 Elsevier Interactive Patient Education  2019 [place] 39-39 Years, Male Preventive care refers to lifestyle choices and visits with your health care provider that can promote health and wellness. What does preventive care include?   A yearly physical exam. This is also called an annual well check.  Dental exams once or twice a year.  Routine eye exams. Ask your health care provider how often you should have your eyes checked.  Personal lifestyle choices, including: ? Daily care of your teeth and gums. ? Regular physical activity. ? Eating a healthy diet. ? Avoiding tobacco and drug use. ? Limiting alcohol use. ? Practicing safe sex. What happens during an annual well check? The services and screenings done by your health care provider during your annual well check will depend on your age, overall health, lifestyle risk factors, and family history of disease. Counseling Your health care provider may ask you questions about your:  Alcohol use.  Tobacco use.  Drug use.  Emotional well-being.  Home and relationship well-being.  Sexual activity.  Eating habits.  Work and work Statistician. Screening You may have the following tests or measurements:  Height, weight, and BMI.  Blood pressure.  Lipid  and cholesterol levels. These may be checked every 5 years starting at age 39.  Diabetes screening. This is done by checking your blood sugar (glucose) after you have not eaten for a while (fasting).  Skin check.  Hepatitis C blood test.  Hepatitis B blood test.  Sexually transmitted disease (STD) testing. Discuss your test results, treatment options, and if necessary, the need for more tests with your health care provider. Vaccines Your health care provider may recommend certain vaccines, such as:  Influenza vaccine. This is recommended every year.  Tetanus, diphtheria, and acellular pertussis (Tdap, Td) vaccine. You may need a Td booster every 10 years.  Varicella vaccine. You may need this if you have not been vaccinated.  HPV vaccine. If you are 72 or younger, you may need three doses over 6 months.  Measles, mumps, and rubella (MMR) vaccine. You may need at least one dose of MMR.You may also need a second dose.  Pneumococcal 13-valent conjugate (PCV13) vaccine. You may need this if you have certain conditions and have not been vaccinated.  Pneumococcal polysaccharide (PPSV23) vaccine. You may need one or two doses if you smoke cigarettes or if you have certain conditions.  Meningococcal vaccine. One dose is recommended if you are age 74-21 years and a first-year college student living in a residence hall, or if  you have one of several medical conditions. You may also need additional booster doses.  Hepatitis A vaccine. You may need this if you have certain conditions or if you travel or work in places where you may be exposed to hepatitis A.  Hepatitis B vaccine. You may need this if you have certain conditions or if you travel or work in places where you may be exposed to hepatitis B.  Haemophilus influenzae type b (Hib) vaccine. You may need this if you have certain risk factors. Talk to your health care provider about which screenings and vaccines you need and how often  you need them. This information is not intended to replace advice given to you by your health care provider. Make sure you discuss any questions you have with your health care provider. Document Released: 12/03/2001 Document Revised: 05/20/2017 Document Reviewed: 08/08/2015 Elsevier Interactive Patient Education  2019 [employer].

## 2019-04-12 NOTE — Progress Notes (Signed)
Established Patient Office Visit  Subjective:  Patient ID: Jesse Wong, male    DOB: 06-Apr-1981  Age: 38 y.o. MRN: [id number]  CC:  Chief Complaint  Patient presents with  . Annual Exam    HPI Jesse Wong presents for CPE.  He is doing well overall.  He did want me to look over a CT that he recently had done for his lungs it did not show any significant changes but did show some atherosclerosis that he wanted to discuss today.  Stress levels were high over the last couple months because of COVID in his job but feels like things are getting a little better.  He denies any problems with bowels etc.  No recent chest pain.  He is active but does not necessarily have an intense exercise routine because he does get short of breath because of his pulmonary disease.  Did want to let me know that he did go for consultation at [facility] for his pulmonary infiltrates to see if they made any new recommendations for his care.  Right now nothing has changed.  Past Medical History:  Diagnosis Date  . Allergy   . Arthritis   . Asthma   . Chronic prostatitis   . Myalgia   . Pulmonary infiltrates   . Sinusitis, chronic   . Tonsillar abscess   . Vasculitis [facility])     Past Surgical History:  Procedure Laterality Date  . BRONCHOSCOPY  2011   TBBX ----inflammation  . IR FLUORO GUIDE CV LINE RIGHT  01/31/2017  . IR FLUORO GUIDE CV MIDLINE PICC RIGHT  01/30/2017  . IR US GUIDE VASC ACCESS RIGHT  01/30/2017  . peritonsilar abscess    . TYMPANOPLASTY    . VIDEO BRONCHOSCOPY Bilateral 06/25/2018   Procedure: VIDEO BRONCHOSCOPY WITHOUT FLUORO;  Surgeon: [name], MD;  Location: [facility];  Service: Cardiopulmonary;  Laterality: Bilateral;    Family History  Problem Relation Age of Onset  . Heart attack Unknown        grandmother  . Hyperlipidemia Father   . Thyroid disease Father        hashimoto's thyroidistis  . Heart disease Paternal Grandfather     Social History    Socioeconomic History  . Marital status: Married    Spouse name: Not on file  . Number of children: 1  . Years of education: Not on file  . Highest education level: Not on file  Occupational History  . Occupation: Building services engineer: [employer]  . Financial resource strain: Not on file  . Food insecurity    Worry: Not on file    Inability: Not on file  . Transportation needs    Medical: Not on file    Non-medical: Not on file  Tobacco Use  . Smoking status: Never Smoker  . Smokeless tobacco: Never Used  Substance and Sexual Activity  . Alcohol use: Yes    Comment: occasional  . Drug use: No  . Sexual activity: Not on file    Comment: [place], married, 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  Lifestyle  . Physical activity    Days per week: Not on file    Minutes per session: Not on file  . Stress: Not on file  Relationships  . Social Herbalist on phone: Not on file    Gets together: Not on file    Attends religious service: Not on file  Active member of club or organization: Not on file    Attends meetings of clubs or organizations: Not on file    Relationship status: Not on file  . Intimate partner violence    Fear of current or ex partner: Not on file    Emotionally abused: Not on file    Physically abused: Not on file    Forced sexual activity: Not on file  Other Topics Concern  . Not on file  Social History Narrative  . Not on file    Outpatient Medications Prior to Visit  Medication Sig Dispense Refill  . albuterol (VENTOLIN HFA) 108 (90 Base) MCG/ACT inhaler Inhale 2 puffs into the lungs every 6 (six) hours as needed for wheezing or shortness of breath. 1 Inhaler 5  . cetirizine (ZYRTEC) 10 MG tablet Take 10 mg by mouth daily.    . cholecalciferol (VITAMIN D) 1000 UNITS tablet Take 1 tablet (1,000 Units total) by mouth daily. 30 tablet 6  . DULERA 200-5 MCG/ACT AERO INHALE 2 PUFFS INTO THE LUNGS 2 TIMES DAILY.  (Patient taking differently: Inhale 2 puffs into the lungs 2 (two) times daily. ) 13 g 11  . fluticasone (FLONASE) 50 MCG/ACT nasal spray Place 2 sprays into both nostrils daily. Needs appt for further refills 16 g 0  . ipratropium (ATROVENT) 0.06 % nasal spray Place 2 sprays into both nostrils every 4 (four) hours as needed for rhinitis. 10 mL 6  . Nebulizers (COMPRESSOR/NEBULIZER) MISC Use as direccted 1 each 0  . benzonatate (TESSALON) 200 MG capsule TAKE 1 CAPSULE BY MOUTH 3 TIMES DAILY AS NEEDED FOR COUGH 30 capsule 5  . Fluticasone Propionate (XHANCE) 93 MCG/ACT EXHU Place 2 sprays into the nose 2 (two) times daily. 16 mL 12  . predniSONE (DELTASONE) 10 MG tablet Take 10 mg by mouth daily.  0   No facility-administered medications prior to visit.     No Known Allergies  ROS Review of Systems      Objective:    Physical Exam  Constitutional: He is oriented to person, place, and time. He appears well-developed and well-nourished.  HENT:  Head: Normocephalic and atraumatic.  Right Ear: External ear normal.  Left Ear: External ear normal.  Nose: Nose normal.  Mouth/Throat: Oropharynx is clear and moist.  Eyes: Pupils are equal, round, and reactive to light. Conjunctivae and EOM are normal.  Neck: Normal range of motion. Neck supple. No thyromegaly present.  Cardiovascular: Normal rate, regular rhythm, normal heart sounds and intact distal pulses.  Pulmonary/Chest: Effort normal and breath sounds normal.  Abdominal: Soft. Bowel sounds are normal. He exhibits no distension and no mass. There is no abdominal tenderness. There is no rebound and no guarding.  Musculoskeletal: Normal range of motion.  Lymphadenopathy:    He has no cervical adenopathy.  Neurological: He is alert and oriented to person, place, and time. He has normal reflexes.  Skin: Skin is warm and dry.  Psychiatric: He has a normal mood and affect. His behavior is normal. Judgment and thought content normal.     BP 108/62   Pulse 66   Ht 6\' 7"  (2.007 m)   Wt 181 lb (82.1 kg)   SpO2 99%   BMI 20.39 kg/m  Wt Readings from Last 3 Encounters:  04/12/19 181 lb (82.1 kg)  12/07/18 176 lb 6.4 oz (80 kg)  07/03/18 171 lb 9.6 oz (77.8 kg)     Health Maintenance Due  Topic Date Due  . HIV  Screening  03/21/1995    There are no preventive care reminders to display for this patient.  Lab Results  Component Value Date   TSH 1.76 06/20/2016   Lab Results  Component Value Date   WBC 6.0 12/16/2017   HGB 16.1 12/16/2017   HCT 45.9 12/16/2017   MCV 90.7 12/16/2017   PLT 326 12/16/2017   Lab Results  Component Value Date   NA 140 03/03/2018   K 4.3 03/03/2018   CO2 31 03/03/2018   GLUCOSE 89 03/03/2018   BUN 13 03/03/2018   CREATININE 1.07 03/03/2018   BILITOT 0.4 03/03/2018   ALKPHOS 80 12/16/2017   AST 14 03/03/2018   ALT 12 03/03/2018   PROT 6.8 03/03/2018   ALBUMIN 4.3 12/16/2017   CALCIUM 9.6 03/03/2018   ANIONGAP 14 12/16/2017   No results found for: CHOL No results found for: HDL No results found for: LDLCALC No results found for: TRIG No results found for: CHOLHDL No results found for: [id number]    Assessment & Plan:   Problem List Items Addressed This Visit      Cardiovascular and Mediastinum   Atherosclerosis of aorta (HCC)    noted on CT performed on May 20.  Noting atherosclerosis in the great vessels and mediastinum as well as coronary arteries including the LAD.  I would like to call radiology to see if they have noted this in the past as it has not been on previous reports.  So to make their help make sure there has not been any short-term progression.  We will start by checking lipid levels.  Also discussed potential benefits of a statin even if his cholesterol numbers are fairly within the normal range.        Other Visit Diagnoses    Wellness examination    -  Primary   Relevant Orders   CBC   COMPLETE METABOLIC PANEL WITH GFR   Lipid panel   TSH     Keep up a regular exercise program and make sure you are eating a healthy diet Try to eat 4 servings of dairy a day, or if you are lactose intolerant take a calcium with vitamin D daily.  Your vaccines are up to date.     No orders of the defined types were placed in this encounter.   Follow-up: Return in about 1 year (around 04/11/2020), or wellness exam.    Nani Gasseratherine Metheney, MDPt would like to discuss CT results.Nani Gasser.Catherine Metheney, MD

## 2019-04-12 NOTE — Assessment & Plan Note (Addendum)
noted on CT performed on May 20.  Noting atherosclerosis in the great vessels and mediastinum as well as coronary arteries including the LAD.  I would like to call radiology to see if they have noted this in the past as it has not been on previous reports.  So to make their help make sure there has not been any short-term progression.  We will start by checking lipid levels.  Also discussed potential benefits of a statin even if his cholesterol numbers are fairly within the normal range.

## 2019-04-13 ENCOUNTER — Telehealth: Payer: Self-pay | Admitting: Family Medicine

## 2019-04-13 ENCOUNTER — Encounter: Payer: Self-pay | Admitting: Family Medicine

## 2019-04-13 LAB — COMPLETE METABOLIC PANEL WITH GFR
AG Ratio: 1.8 (calc) (ref 1.0–2.5)
ALT: 14 U/L (ref 9–46)
AST: 16 U/L (ref 10–40)
Albumin: 4.7 g/dL (ref 3.6–5.1)
Alkaline phosphatase (APISO): 62 U/L (ref 36–130)
BUN: 12 mg/dL (ref 7–25)
CO2: 31 mmol/L (ref 20–32)
Calcium: 9.6 mg/dL (ref 8.6–10.3)
Chloride: 99 mmol/L (ref 98–110)
Creat: 0.96 mg/dL (ref 0.60–1.35)
GFR, Est African American: 115 mL/min/{1.73_m2} (ref >=60)
GFR, Est Non African American: 99 mL/min/{1.73_m2} (ref >=60)
Globulin: 2.6 g/dL (calc) (ref 1.9–3.7)
Glucose, Bld: 88 mg/dL (ref 65–99)
Potassium: 4.1 mmol/L (ref 3.5–5.3)
Sodium: 138 mmol/L (ref 135–146)
Total Bilirubin: 1 mg/dL (ref 0.2–1.2)
Total Protein: 7.3 g/dL (ref 6.1–8.1)

## 2019-04-13 LAB — CBC
HCT: 49.7 % (ref 38.5–50.0)
Hemoglobin: 17.2 g/dL — ABNORMAL HIGH (ref 13.2–17.1)
MCH: 31.9 pg (ref 27.0–33.0)
MCHC: 34.6 g/dL (ref 32.0–36.0)
MCV: 92.2 fL (ref 80.0–100.0)
MPV: 10.5 fL (ref 7.5–12.5)
Platelets: 286 10*3/uL (ref 140–400)
RBC: 5.39 10*6/uL (ref 4.20–5.80)
RDW: 12.4 % (ref 11.0–15.0)
WBC: 6.4 10*3/uL (ref 3.8–10.8)

## 2019-04-13 LAB — LIPID PANEL
Cholesterol: 198 mg/dL (ref <200)
HDL: 33 mg/dL — ABNORMAL LOW (ref >=40)
LDL Cholesterol (Calc): 131 mg/dL (calc) — ABNORMAL HIGH
Non-HDL Cholesterol (Calc): 165 mg/dL (calc) — ABNORMAL HIGH (ref <130)
Total CHOL/HDL Ratio: 6 (calc) — ABNORMAL HIGH (ref <5.0)
Triglycerides: 198 mg/dL — ABNORMAL HIGH (ref <150)

## 2019-04-13 LAB — TSH: TSH: 3.75 mIU/L (ref 0.40–4.50)

## 2019-04-13 MED ORDER — ATORVASTATIN CALCIUM 20 MG PO TABS: 20.0000 mg | ORAL_TABLET | Freq: Every day | ORAL | 3 refills | Status: DC

## 2019-04-13 MED FILL — ATORVASTATIN 20 MG TABLET: 20 | 90 days supply | Qty: 90 | Fill #0

## 2019-04-13 NOTE — Telephone Encounter (Signed)
When you get a chance can you please call radiology and asked to speak to the radiologist to read his recent CT back in May.  There was a note of some atherosclerosis in several blood vessels.  I just want to find out if they saw this in previous CTs or if they felt like this was more new or progressive.  It may just be that it was not noted or commented on in previous CTs but I just want a make sure

## 2019-04-13 NOTE — Telephone Encounter (Signed)
Called [facility] [phone] and spoke with Valarie Merino who advised me that radiologist would not be in office until 12:30 today.

## 2019-04-14 NOTE — Telephone Encounter (Signed)
The radiologist called back and stated the comparison from the last CT was similar. He did note some new nodules.

## 2019-06-03 DIAGNOSIS — B449 Aspergillosis, unspecified: Secondary | ICD-10-CM | POA: Diagnosis not present

## 2019-06-03 DIAGNOSIS — Z7952 Long term (current) use of systemic steroids: Secondary | ICD-10-CM | POA: Diagnosis not present

## 2019-06-03 DIAGNOSIS — M317 Microscopic polyangiitis: Secondary | ICD-10-CM | POA: Diagnosis not present

## 2019-06-03 DIAGNOSIS — L4059 Other psoriatic arthropathy: Secondary | ICD-10-CM | POA: Diagnosis not present

## 2019-06-07 ENCOUNTER — Ambulatory Visit (INDEPENDENT_AMBULATORY_CARE_PROVIDER_SITE_OTHER): Payer: 59 | Admitting: Internal Medicine

## 2019-06-07 ENCOUNTER — Other Ambulatory Visit: Payer: Self-pay

## 2019-06-07 ENCOUNTER — Encounter: Payer: Self-pay | Admitting: Internal Medicine

## 2019-06-07 DIAGNOSIS — M3130 Wegener's granulomatosis without renal involvement: Secondary | ICD-10-CM | POA: Diagnosis not present

## 2019-06-07 DIAGNOSIS — R05 Cough: Secondary | ICD-10-CM | POA: Diagnosis not present

## 2019-06-07 DIAGNOSIS — R059 Cough, unspecified: Secondary | ICD-10-CM

## 2019-06-07 NOTE — Progress Notes (Signed)
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 Presented 2011 with nodular infiltrates and progressive weakness/neuropathy. Bronchoscoped 2011- Neg.  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 FVC 4.91/73%, FEV1 4.19/83%, FEV1/FVC 0.85, FEF 25-75% 0.85, TLC 78%, DLCO 113% PFT 06/08/2014-normal lung volumes, minimal slowing and small airway flows with response to bronchodilator, normal diffusion CXR 08/30/2014-Emphysematous and bronchitic changes consistent with COPD. Bronchoscopy 2019-Dr. Sood-BAL-normal flora.  Aspergillus Ag BAL 0.10 WNL, fungal cultures negative, AFB neg. cytology benign. PFT [facility], 03/10/2019 without interpretation- numerical results scanned to media. 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 Presented 2011 with nodular infiltrates and progressive weakness/neuropathy. Bronchoscopy 2019-Dr. Sood-BAL-normal flora.  Aspergillus Ag BAL 0.10 WNL, fungal cultures negative, AFB neg, cytology benign. -----Pt states he has been doing well since last visit and denies any complaints. Albuterol HFA, Tessalon Perles, Dulera 200, Flonase, Xhance, Atrovent 0.06% nasal, prednisone 10 mg daily He has been feeling very well.  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  He does a lot of singing and finds he can take  a deeper breath and hold notes better than he has in a long time. His rheumatologist stopped rituxan and, uncomfortable with the possibility that he might have an infection.  He uses prednisone only during exacerbations.  We talked about referral options if he had progressive disease-[facility], [facility].  We discussed possibility of open biopsy, but this also would be a consideration only if he were showing progressive deterioration. We discussed the results of his bronchoscopy with BAL, finding only normal flora. CT max face limited 01/13/2018- IMPRESSION: Mild chronic mucosal thickening throughout the paranasal sinuses, similar in degree to the prior study. No fluid. Chest CT 06/01/2018-  IMPRESSION: 1. Mild bilateral tree-in-bud opacities again noted, slightly increased in some portions of the lungs. This is compatible with infection, including atypical mycobacterium. 2. Stable 1 x 1.5 cm RIGHT LOWER lobe nodule, likely benign. Consider CT follow-up in 18-24 months  06/07/2019-  Virtual Visit via Telephone Note  I connected with Jesse Wong on 06/07/19 at  3:00 PM EDT by telephone and verified that I am speaking with the correct person using two identifiers.  Location: Patient: H Provider:O   I discussed the limitations, risks, security and privacy concerns of performing an evaluation and management service by telephone and the availability of in person appointments. I also discussed with the patient that there may be a patient responsible charge related to this service. The patient expressed understanding and agreed to proceed.   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 Presented 2011 with nodular infiltrates and progressive  weakness/neuropathy. Saw Dr Posey Pronto [facility] who did PFT 5/20, at a time of feeling well. There are small decreases in scores from our last PFT, but no interpretation is found. Dr Posey Pronto is noted to be an expert on pulmonary infections in the context of unusual pulmonary disorders. He suggest that before any more systemic immunosuppressive therapy, another bronchoscopy for further cultures be considered.  Dr  Jesse Wong continues to follow for Rheumatology. Currently not on immunosuppression and feels stable. Dulera 200 when needed, albuterol hfa, Flonase, Atrovent nasal spray, neb albuterol   Observations/Objective: CT chest 03/11/2019-  IMPRESSION: 1. The appearance of the lungs is essentially unchanged compared to the prior study 06/01/2018, as detailed above. 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 2. Aortic atherosclerosis, in addition to left anterior descending coronary artery disease. Please note that although the presence of coronary artery calcium documents the presence of coronary artery disease, the severity of this disease and any potential stenosis cannot be assessed on this non-gated CT examination. Assessment for potential risk factor modification, dietary therapy or pharmacologic therapy may be warranted, if clinically indicated.  Aortic Atherosclerosis (ICD10-I70.0).  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 Plan- CT chest HR, no contrast, in 1 year as discussed. Sooner if clinically indicated.  Follow Up Instructions: 6 months   I discussed the assessment and treatment plan with the patient. The patient was provided an opportunity to ask questions and all were answered. The patient agreed with the plan and demonstrated an understanding of the instructions.    The patient was advised to call back or seek an in-person evaluation if the symptoms worsen or if the condition fails to improve as anticipated.  I provided 21 minutes of non-face-to-face time during this encounter.   Baird Lyons, MD     ROS-see HPI + = positive Constitutional:   No-   weight loss, night sweats, fevers, chills, fatigue, lassitude. HEENT:   No-  headaches, difficulty swallowing, tooth/dental problems, sore throat,       No-  sneezing, itching, +ear ache,+ nasal congestion, post nasal drip,  CV:  No-   chest pain, orthopnea, PND, swelling in lower extremities, anasarca,                                                    dizziness, palpitations Resp: shortness of breath with exertion or at rest.              No-   productive cough,  + non-productive cough,  No- coughing up of blood.              No-   change in color of mucus.  + wheezing.   Skin: No-   rash or lesions. GI:  No-   heartburn, indigestion, abdominal pain, nausea, vomiting,  GU:  MS:  No-   joint pain or swelling.   Neuro-     nothing unusual Psych:  No- change in mood or affect. No depression or anxiety.  No memory loss.  OBJ- Physical Exam General- Alert, Oriented, Affect-appropriate, Distress- none acute, tall, thin, + looks well today Skin- no rash Lymphadenopathy- none Head- atraumatic            Eyes- Gross vision intact, PERRLA, conjunctivae and secretions clear            Ears- Hearing, canals-normal            Nose- + marked turbinate edema, no-Septal dev, mucus, polyps, erosion, perforation             Throat- Mallampati II , mucosa clear , drainage- none, tonsils- atrophic Neck- flexible ,  trachea midline, no stridor , thyroid nl, carotid no bruit Chest - symmetrical excursion , unlabored           Heart/CV- RRR , no murmur , no gallop  , no rub, nl s1 s2                           - JVD- none , edema- none, stasis changes- none, varices- none           Lung-  wheeze-none, cough-none ,  dullness-none, rub- none,            Chest wall-  Abd-  Br/ Gen/ Rectal- Not done, not indicated Extrem- cyanosis- none, clubbing, none, atrophy- none, strength- nl Neuro- grossly intact to observation

## 2019-06-07 NOTE — Patient Instructions (Signed)
Really glad you are doing well. Hope this continues long term.  Order- schedule future CT chest, high resolution, no contrast 1 year, dx Wegener's  Please call for med refills as needed

## 2019-06-07 NOTE — Assessment & Plan Note (Signed)
Now in remission. Plan f/u CT high res, no contrast in 1 year. Continues to follow with Dr [name]. Dr Posey Pronto, [facility] pulm second opinion, suggests repeat bronchoscopy for BAL cultures if another interval of immunosuppressive therapy is needed, to minimize chance of aggravating unrecognized infection.

## 2019-07-15 ENCOUNTER — Other Ambulatory Visit: Payer: Self-pay | Admitting: Family Medicine

## 2019-07-15 MED FILL — FLUTICASONE PROP 50 MCG SPR: 50 | 30 days supply | Qty: 16 | Fill #0

## 2019-07-15 MED FILL — ATORVASTATIN 20 MG TABLET: 20 | 90 days supply | Qty: 90 | Fill #1

## 2019-10-19 MED FILL — ATORVASTATIN 20 MG TABLET: 20 | 90 days supply | Qty: 90 | Fill #2

## 2019-12-06 ENCOUNTER — Other Ambulatory Visit: Payer: Self-pay

## 2019-12-06 ENCOUNTER — Ambulatory Visit (INDEPENDENT_AMBULATORY_CARE_PROVIDER_SITE_OTHER): Payer: 59 | Admitting: Internal Medicine

## 2019-12-06 ENCOUNTER — Encounter: Payer: Self-pay | Admitting: Internal Medicine

## 2019-12-06 DIAGNOSIS — J45909 Unspecified asthma, uncomplicated: Secondary | ICD-10-CM

## 2019-12-06 DIAGNOSIS — M3130 Wegener's granulomatosis without renal involvement: Secondary | ICD-10-CM | POA: Diagnosis not present

## 2019-12-06 NOTE — Progress Notes (Signed)
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 Presented 2011 with nodular infiltrates and progressive weakness/neuropathy. Bronchoscoped 2011- Neg.  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 FVC 4.91/73%, FEV1 4.19/83%, FEV1/FVC 0.85, FEF 25-75% 0.85, TLC 78%, DLCO 113% PFT 06/08/2014-normal lung volumes, minimal slowing and small airway flows with response to bronchodilator, normal diffusion CXR 08/30/2014-Emphysematous and bronchitic changes consistent with COPD. Bronchoscopy 2019-Dr. Sood-BAL-normal flora.  Aspergillus Ag BAL 0.10 WNL, fungal cultures negative, AFB neg. cytology benign. PFT [facility], 03/10/2019 without interpretation- numerical results scanned to media. 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 Presented 2011 with nodular infiltrates and progressive weakness/neuropathy. Saw Dr Posey Pronto [facility] who did PFT 5/20, at a time of feeling well. There are small decreases in scores from our last PFT, but no interpretation is found. Dr Posey Pronto is noted to be an expert on pulmonary infections in the context of unusual pulmonary disorders. He suggest that before any more systemic immunosuppressive therapy, another bronchoscopy for  further cultures be considered.  Dr [name] continues to follow for Rheumatology. Currently not on immunosuppression and feels stable. Dulera 200 when needed, albuterol hfa, Flonase, Atrovent nasal spray, neb albuterol   Observations/Objective: CT chest 03/11/2019-  IMPRESSION: 1. The appearance of the lungs is essentially unchanged compared to the prior study 06/01/2018, as detailed above. 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 2. Aortic atherosclerosis, in addition to left anterior descending coronary artery disease. Please note that although the presence of coronary artery calcium documents the presence of coronary artery disease, the severity of this disease and any potential stenosis cannot be assessed on this non-gated CT examination. Assessment for potential risk factor modification, dietary therapy or pharmacologic therapy may be warranted, if clinically indicated.  Aortic Atherosclerosis (ICD10-I70.0).  Assessment and Plan: Granulomatous lung inflammation, possibly Wegener's- management per Dr [name] Chronic bronchitis- not needing bronchodilators currently and continues in remission. Plan- CT chest HR, no contrast, in 1 year as discussed. Sooner if clinically indicated.    12/06/19- Virtual Visit via Telephone Note  I connected with Jesse Wong on 12/06/19 at  4:00 PM EST by telephone and verified that I am speaking with the correct person using two identifiers.  Location: Patient: W Provider: O   I discussed the limitations, risks, security and privacy concerns of performing an evaluation and management service by telephone and the availability of in person appointments. I also discussed with the patient that there may be a patient responsible charge related to this service. The patient expressed understanding and agreed to proceed.   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 Presented 2011 with nodular infiltrates and progressive weakness/neuropathy. Saw Dr Posey Pronto [facility] who did PFT 5/20, at a time of feeling well. There are small decreases in scores from our last PFT, but no interpretation is found. Dr Posey Pronto is noted to be an expert on  pulmonary infections in the context of unusual pulmonary disorders. He suggest that before any more systemic immunosuppressive therapy, another bronchoscopy for further cultures be considered.  -----f/u Wegeners Granulomatosis  Had f/u with Dr Dawson Bills in Sept, deciding to continue following annual CT,watching for evolution. Intent would be to repeat bronchoscopy for culture before immunotherapy, esp looking for MAIC/ atypicals, and to reserve macrolides incase needed for those in future.  Neb albuterol, albuterol hfa, atrovent nasal spray Fortunately he reports he continues to do very well off antiinflammatory meds and antibiotics with no significant exacerbation. Had both Covid vax and Flu vax. 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 No acute needs.  Observations/Objective: Pending f/u CT chest 05/2020  Assessment and Plan: Granulomatous lung inflammation, possibly Wegener's- management per Dr [name].  F/U chest CT is scheduled for May. Bronch for cultures/ sens and possible referral to eg [facility] in case of relapse.  Chronic bronchitis- not needing bronchodilators currently and continues in remission To save macrolides in case of MAIC.   Follow Up Instructions: 6 months   I discussed the assessment and treatment plan with the patient. The patient was provided an opportunity to ask questions and all were answered. The patient  agreed with the plan and demonstrated an understanding of the instructions.   The patient was advised to call back or seek an in-person evaluation if the symptoms worsen or if the condition fails to improve as anticipated.  I provided 18 minutes of non-face-to-face time during this encounter.   Baird Lyons, MD    -----------------------------------------  ROS-see HPI + = positive Constitutional:   No-   weight loss, night sweats, fevers, chills, fatigue, lassitude. HEENT:   No-  headaches, difficulty swallowing, tooth/dental problems, sore throat,       No-  sneezing, itching, +ear ache,+ nasal congestion, post nasal drip,  CV:  No-   chest pain, orthopnea, PND, swelling in lower extremities, anasarca,                                                    dizziness, palpitations Resp: shortness of breath with exertion or at rest.              No-   productive cough,  + non-productive cough,  No- coughing up of blood.              No-   change in color of mucus.  + wheezing.   Skin: No-   rash or lesions. GI:  No-   heartburn, indigestion, abdominal pain, nausea, vomiting,  GU:  MS:  No-   joint pain or swelling.   Neuro-     nothing unusual Psych:  No- change in mood or affect. No depression or anxiety.  No memory loss.  OBJ- Physical Exam General- Alert, Oriented, Affect-appropriate, Distress- none acute, tall, thin, + looks well today Skin- no rash Lymphadenopathy- none Head- atraumatic            Eyes- Gross vision intact, PERRLA, conjunctivae and secretions clear            Ears- Hearing, canals-normal            Nose- + marked turbinate edema, no-Septal dev, mucus, polyps, erosion, perforation  Throat- Mallampati II , mucosa clear , drainage- none, tonsils- atrophic Neck- flexible , trachea midline, no stridor , thyroid nl, carotid no bruit Chest - symmetrical excursion , unlabored           Heart/CV- RRR , no murmur , no gallop  , no rub, nl s1 s2                            - JVD- none , edema- none, stasis changes- none, varices- none           Lung-  wheeze-none, cough-none , dullness-none, rub- none,            Chest wall-  Abd-  Br/ Gen/ Rectal- Not done, not indicated Extrem- cyanosis- none, clubbing, none, atrophy- none, strength- nl Neuro- grossly intact to observation

## 2019-12-06 NOTE — Assessment & Plan Note (Signed)
Remains in clinical remission. Continues to work with Dr Dierdre Forth Rheumatology. If he relapses, strategy coordinated with Dr Patel[facility], would likely include bronchoscopy for specific cultures and sensitivities before initiating immunotherapy, and possible referral to a center such as [facility].

## 2019-12-06 NOTE — Assessment & Plan Note (Signed)
Minimal use of bronchodilators as he remains in remission during Covid precautions. In case of acute bronchitis, we wish to defer use of macrolides, which may be needed later for MAIC/ atypical AFB.

## 2019-12-06 NOTE — Patient Instructions (Addendum)
I'm really glad you are doing so well !. Interesting update on your projects as well.  We will get your 1 year follow-up High Resolution CT chest  Done in May, 2021 For dx Granulomatous Inflammation/ Wegener's Vasculitis  Please call if we can help

## 2020-02-11 DIAGNOSIS — M3130 Wegener's granulomatosis without renal involvement: Secondary | ICD-10-CM

## 2020-02-11 NOTE — Telephone Encounter (Signed)
PCC's please advise on patient mychart message:  I have a CT scan scheduled or ordered and would like for [facility] to [facility] at [facility]. When I tried to have my appointment moved, the [facility] location asked me to reach back to my provider's office. Thanks

## 2020-02-11 NOTE — Telephone Encounter (Signed)
Jesse Wong we will need a new order since it had already been scheduled at [facility] they are unable to use another order if it has been scheduled

## 2020-03-07 ENCOUNTER — Other Ambulatory Visit: Payer: Self-pay | Admitting: Family Medicine

## 2020-03-07 ENCOUNTER — Telehealth (INDEPENDENT_AMBULATORY_CARE_PROVIDER_SITE_OTHER): Payer: 59 | Admitting: Family Medicine

## 2020-03-07 ENCOUNTER — Encounter: Payer: Self-pay | Admitting: Family Medicine

## 2020-03-07 DIAGNOSIS — R197 Diarrhea, unspecified: Secondary | ICD-10-CM

## 2020-03-07 DIAGNOSIS — I700 Atherosclerosis of aorta: Secondary | ICD-10-CM

## 2020-03-07 DIAGNOSIS — R6889 Other general symptoms and signs: Secondary | ICD-10-CM

## 2020-03-07 DIAGNOSIS — T50905A Adverse effect of unspecified drugs, medicaments and biological substances, initial encounter: Secondary | ICD-10-CM

## 2020-03-07 DIAGNOSIS — L750 Bromhidrosis: Secondary | ICD-10-CM

## 2020-03-07 MED ORDER — PRAVASTATIN SODIUM 20 MG PO TABS: 20.0000 mg | ORAL_TABLET | Freq: Every day | ORAL | 3 refills | Status: DC

## 2020-03-07 MED ORDER — FLUCONAZOLE 150 MG PO TABS: 150.0000 mg | ORAL_TABLET | ORAL | 0 refills | Status: DC

## 2020-03-07 MED FILL — PRAVASTATIN SODIUM 20 MG TA: 20 | 90 days supply | Qty: 90 | Fill #0

## 2020-03-07 MED FILL — FLUCONAZOLE 150 MG TABS: 150 | 6 days supply | Qty: 3 | Fill #0

## 2020-03-07 NOTE — Progress Notes (Signed)
Virtual Visit via Video Note  I connected with Jesse Wong on 03/07/20 at  2:20 PM EDT by a video enabled telemedicine application and verified that I am speaking with the correct person using two identifiers.   I discussed the limitations of evaluation and management by telemedicine and the availability of in person appointments. The patient expressed understanding and agreed to proceed.  Subjective:    CC: Yeast infection?  HPI: Pt reports that he noticed 6 months ago and didn't think about and wasn't sure what was going on.    He stated that he has an unusual smell even after he takes a shower. Denies itch/smell or pain in urethra. No f/s/c/n/v. He does confirm that he has had diarrhea at least 1 x weekly. (he would like to get in w/GI about this but was told to wait last year or so due to a Chemo type medication he was taking at that time).  He has tried lotrimin which helps but comes back after a while. Usually works for a couple of weeks.   He said that his wife did develop an itchy d/c after intercourse but she was treated and it was cleared up and she hasn't had any problems since. However, around this time she had changed feminine napkin products when the d/c started also.  Reports stopped statin a couple of mo ago.  Was getting muscle  "jerking".    Past medical history, Surgical history, Family history not pertinant except as noted below, Social history, Allergies, and medications have been entered into the medical record, reviewed, and corrections made.   Review of Systems: No fevers, chills, night sweats, weight loss, chest pain, or shortness of breath.   Objective:    General: Speaking clearly in complete sentences without any shortness of breath.  Alert and oriented x3.  Normal judgment. No apparent acute distress.    Impression and Recommendations:    Atherosclerosis of aorta (HCC) We will try different statin to see if it is better tolerated.  Odor -  Discussed options.  Will try oral diflucan. If not improving or resolving his symptoms then consider treating similar to erythrasma.  Medication side effect -recommend a trial of a different statin will switch to pravastatin to take daily. We will start with 20 mg which is a little bit lower strength in the Lipitor 20 that he was taking.    Time spent in encounter 21 minutes  I discussed the assessment and treatment plan with the patient. The patient was provided an opportunity to ask questions and all were answered. The patient agreed with the plan and demonstrated an understanding of the instructions.   The patient was advised to call back or seek an in-person evaluation if the symptoms worsen or if the condition fails to improve as anticipated.   Nani Gasser, MD

## 2020-03-07 NOTE — Progress Notes (Signed)
Pt reports that he noticed 6 months ago and didn't think about and wasn't sure what was going on.    He stated that he has an unusual smell even after he takes a shower. Denies itch/smell or pain in urethra. No f/s/c/n/v. He does confirm that he has had diarrhea at least 1 x weekly. (he would like to get in w/GI about this but was told to wait last year or so due to a Chemo type medication he was taking at that time).  He has tried lotrimin which helps but comes back after a while.  He said that his wife did develop an itchy d/c after intercourse but she was treated and it was cleared up and she hasn't had any problems since. However, around this time she had changed feminine napkin products when the d/c started also.

## 2020-03-07 NOTE — Assessment & Plan Note (Signed)
We will try different statin to see if it is better tolerated.

## 2020-03-08 ENCOUNTER — Other Ambulatory Visit: Payer: Self-pay

## 2020-03-08 ENCOUNTER — Ambulatory Visit (INDEPENDENT_AMBULATORY_CARE_PROVIDER_SITE_OTHER): Payer: 59

## 2020-03-08 DIAGNOSIS — M3130 Wegener's granulomatosis without renal involvement: Secondary | ICD-10-CM | POA: Diagnosis not present

## 2020-03-08 DIAGNOSIS — J410 Simple chronic bronchitis: Secondary | ICD-10-CM | POA: Diagnosis not present

## 2020-03-09 ENCOUNTER — Other Ambulatory Visit: Payer: 59

## 2020-03-09 NOTE — Progress Notes (Signed)
VM left requesting a return call to provide CT results.

## 2020-03-15 NOTE — Progress Notes (Signed)
Patient identification verified. Results of recent high resolution CT of chest reviewed.  Per Dr. Maple Hudson, CT chest- stable with persistent scattered mild inflammation. Chronic infection can look like this, but so can any low grade inflammation such as Wegener's.   Patient verbalized understanding of results, next follow up appointment reviewed.

## 2020-03-27 ENCOUNTER — Encounter: Payer: Self-pay | Admitting: Physician Assistant

## 2020-03-27 ENCOUNTER — Other Ambulatory Visit (INDEPENDENT_AMBULATORY_CARE_PROVIDER_SITE_OTHER): Payer: 59

## 2020-03-27 ENCOUNTER — Ambulatory Visit: Payer: 59 | Admitting: Physician Assistant

## 2020-03-27 VITALS — BP 110/78 | HR 61 | Ht 79.0 in | Wt 183.2 lb

## 2020-03-27 DIAGNOSIS — R197 Diarrhea, unspecified: Secondary | ICD-10-CM | POA: Diagnosis not present

## 2020-03-27 DIAGNOSIS — R109 Unspecified abdominal pain: Secondary | ICD-10-CM

## 2020-03-27 LAB — IGA: IgA: 208 mg/dL (ref 68–378)

## 2020-03-27 LAB — C-REACTIVE PROTEIN: CRP: <1 mg/dL (ref 0.5–20.0)

## 2020-03-27 LAB — SEDIMENTATION RATE: Sed Rate: 8 mm/hr (ref 0–15)

## 2020-03-27 LAB — CBC WITH DIFFERENTIAL/PLATELET
Basophils Absolute: 0 10*3/uL (ref 0.0–0.1)
Basophils Relative: 0.4 % (ref 0.0–3.0)
Eosinophils Absolute: 0.3 10*3/uL (ref 0.0–0.7)
Eosinophils Relative: 4 % (ref 0.0–5.0)
HCT: 50.2 % (ref 39.0–52.0)
Hemoglobin: 17.1 g/dL — ABNORMAL HIGH (ref 13.0–17.0)
Lymphocytes Relative: 27.3 % (ref 12.0–46.0)
Lymphs Abs: 2.2 10*3/uL (ref 0.7–4.0)
MCHC: 34.1 g/dL (ref 30.0–36.0)
MCV: 92.8 fl (ref 78.0–100.0)
Monocytes Absolute: 0.5 10*3/uL (ref 0.1–1.0)
Monocytes Relative: 6 % (ref 3.0–12.0)
Neutro Abs: 5 10*3/uL (ref 1.4–7.7)
Neutrophils Relative %: 62.3 % (ref 43.0–77.0)
Platelets: 233 10*3/uL (ref 150.0–400.0)
RBC: 5.41 Mil/uL (ref 4.22–5.81)
RDW: 12.4 % (ref 11.5–15.5)
WBC: 8 10*3/uL (ref 4.0–10.5)

## 2020-03-27 NOTE — Progress Notes (Signed)
Subjective:    Patient ID: Jesse Wong, male    DOB: July 26, 1981, 39 y.o.   MRN: [id number]  HPI "Jesse Wong" is a pleasant 39 year old male, established with Dr. Carlean Purl and last seen in 2016 He comes in today with persistent complaint of intermittent diarrheal episodes usually occurring at least twice per week sometimes more.  He says he has had his current symptoms over the past 4 to 5 years, and thinks symptoms started after he had a particularly severe bout of diarrhea and was eventually diagnosed with C. difficile in 2016. When he had been seen by Dr. Carlean Purl in 2016 he was to schedule colonoscopy but that was not completed. Patient does have history of GERD, asthma, psoriatic arthritis, microscopic polyangiitis, history of Wegener's granulomatosis/vasculitis with pulmonary involvement, prior history of aspergillus, and chronic sinusitis. He had been on immunotherapy in the past but has not been requiring any treatment for his autoimmune disease over the past couple of years. He says his symptoms may be aggravated by dairy or spicy food but he is not certain.  He says on days with diarrhea he will start with a formed stool which then gradually becomes progressively looser until he completely evacuates his bowel.  He has not been noticing any melena or hematochezia.  He does get some cramping or pain at the onset of these episodes.  He will have normal bowel movements in between.  He does get a sense of some burning and also some gurgling in his lower abdomen at times and chronically feels that he has an increased amount of gas.  Appetite has been good, weight has been stable. Family history is pertinent for maternal grandmother with ulcerative colitis.  He would like to undergo colonoscopy.  Review of Systems Pertinent positive and negative review of systems were noted in the above HPI section.  All other review of systems was otherwise negative.  Outpatient Encounter Medications as of 03/27/2020    Medication Sig  . albuterol (2.5 MG/3ML) 0.083% NEBU 3 mL, albuterol (5 MG/ML) 0.5% NEBU 0.5 mL Inhale into the lungs as needed.  [name] albuterol (VENTOLIN HFA) 108 (90 Base) MCG/ACT inhaler Inhale 2 puffs into the lungs every 6 (six) hours as needed for wheezing or shortness of breath.  . cetirizine (ZYRTEC) 10 MG tablet Take 10 mg by mouth daily.  . cholecalciferol (VITAMIN D) 1000 UNITS tablet Take 1 tablet (1,000 Units total) by mouth daily.  . DULERA 200-5 MCG/ACT AERO INHALE 2 PUFFS INTO THE LUNGS 2 TIMES DAILY. (Patient taking differently: Inhale 2 puffs into the lungs 2 (two) times daily as needed. )  . fluticasone (FLONASE) 50 MCG/ACT nasal spray INSTILL 2 SPRAYS INTO BOTH NOSTRILS DAILY (NEED APPT FOR FURTHER REFILLS)  . ipratropium (ATROVENT) 0.06 % nasal spray Place 2 sprays into both nostrils every 4 (four) hours as needed for rhinitis.  . Nebulizers (COMPRESSOR/NEBULIZER) MISC Use as direccted  . pravastatin (PRAVACHOL) 20 MG tablet Take 1 tablet (20 mg total) by mouth daily.  . [DISCONTINUED] fluconazole (DIFLUCAN) 150 MG tablet Take 1 tablet (150 mg total) by mouth every other day.   No facility-administered encounter medications on file as of 03/27/2020.   Allergies  Allergen Reactions  . Lipitor [Atorvastatin] Other (See Comments)    myalgias   Patient Active Problem List   Diagnosis Date Noted  . Atherosclerosis of aorta ([place]) 04/12/2019  . Pulmonary infiltrates   . 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[place]) 03/03/2018  . Medication monitoring encounter 03/03/2018  . Pulmonary nodule  seen on imaging study 01/15/2018  . Microscopic polyangiitis ([place]) 03/14/2016  . 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 Dyshidrotic eczema 04/08/2014  . Wegener's disease, pulmonary ([place]) 07/06/2010  . MYALGIA 06/28/2010  . GERD 06/27/2010  . Fever 06/08/2010  . Nonspecific (abnormal) findings on radiological and other examination of body structure 05/29/2010  . CT, CHEST, ABNORMAL 05/29/2010  .  Asthma with bronchitis 04/09/2010  . 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   Social History   Socioeconomic History  . Marital status: Married    Spouse name: Not on file  . Number of children: 1  . Years of education: Not on file  . Highest education level: Not on file  Occupational History  . Occupation: Nuclear Medicine    Employer: [employer]  Tobacco Use  . Smoking status: Never Smoker  . Smokeless tobacco: Never Used  Substance and Sexual Activity  . Alcohol use: Yes    Comment: occasional  . Drug use: No  . Sexual activity: Not on file    Comment: [place], married, 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  Other Topics Concern  . Not on file  Social History Narrative  . Not on file   Social Determinants of Health   Financial Resource Strain:   . Difficulty of Paying Living Expenses:   Food Insecurity:   . Worried About Charity fundraiser in the Last Year:   . Arboriculturist in the Last Year:   Transportation Needs:   . Film/video editor (Medical):   [name] Lack of Transportation (Non-Medical):   Physical Activity:   . Days of Exercise per Week:   . Minutes of Exercise per Session:   Stress:   . Feeling of Stress :   Social Connections:   . Frequency of Communication with Friends and Family:   . Frequency of Social Gatherings with Friends and Family:   . Attends Religious Services:   . Active Member of Clubs or Organizations:   . Attends Archivist Meetings:   [name] Marital Status:   Intimate Partner Violence:   . Fear of Current or Ex-Partner:   . Emotionally Abused:   [name] Physically Abused:   . Sexually Abused:     Mr. Harmes family history includes Heart attack in an other family member; Heart disease in his paternal grandfather; Hyperlipidemia in his father; Thyroid disease in his father.      Objective:    Vitals:   03/27/20 1528  BP: 110/78  Pulse: 61    Physical Exam Well-developed well-nourished male in no acute distress.   Height, [id number]  BMI20.6  HEENT; nontraumatic normocephalic, EOMI, PE RR LA, sclera anicteric. Oropharynx;not done Neck; supple, no JVD Cardiovascular; regular rate and rhythm with S1-S2, no murmur rub or gallop Pulmonary; Clear bilaterally Abdomen; soft, nontender, nondistended, no palpable mass or hepatosplenomegaly, bowel sounds are active Rectal;not done today Skin; benign exam, no jaundice rash or appreciable lesions Extremities; no clubbing cyanosis or edema skin warm and dry Neuro/Psych; alert and oriented x4, grossly nonfocal mood and affect appropriate       Assessment & Plan:   #69 39 year old white male with 4 to 5-year history of intermittent diarrheal episodes occurring at least twice per week, and resulting incomplete evacuation of his bowel. Onset coincided with diagnosis of C. difficile in 2016. Rule out persistent postinfectious IBS, rule out underlying mild IBD #2 family history of ulcerative colitis #3 personal history of autoimmune disease with psoriatic arthritis, microscopic polyangiitis, and  Wegener's granulomatosis with pulmonary involvement 4.  Asthma 5.  Chronic sinusitis 6.  Prior history of aspergillus  Plan; we discussed avoidance of artificial sweeteners, he is asked to monitor lactose intake and correlate with episodes of diarrhea. Offered an antispasmodic, he does not feel needed at this time. Patient will be scheduled for colonoscopy with Dr. Carlean Purl.  Procedure was discussed in detail with patient including indications risks and benefits and he is agreeable to proceed. Patient has completed COVID-19 vaccination. We will check CBC, be met, sed rate CRP TTG and IgA.  Noami Bove S Mizraim Harmening PA-C 03/27/2020   Cc: Hali Marry, *

## 2020-03-27 NOTE — Patient Instructions (Signed)
If you are age 40 or older, your body mass index should be between 23-30. Your Body mass index is 20.64 kg/m. If this is out of the aforementioned range listed, please consider follow up with your Primary Care Provider.  If you are age 75 or younger, your body mass index should be between 19-25. Your Body mass index is 20.64 kg/m. If this is out of the aformentioned range listed, please consider follow up with your Primary Care Provider.   You have been scheduled for a colonoscopy. Please follow written instructions given to you at your visit today.  Please pick up your prep supplies at the pharmacy within the next 1-3 days. If you use inhalers (even only as needed), please bring them with you on the day of your procedure.  Your provider has requested that you go to the basement level for lab work before leaving today. Press "B" on the elevator. The lab is located at the first door on the left as you exit the elevator.  Due to recent changes in healthcare laws, you may see the results of your imaging and laboratory studies on MyChart before your provider has had a chance to review them.  We understand that in some cases there may be results that are confusing or concerning to you. Not all laboratory results come back in the same time frame and the provider may be waiting for multiple results in order to interpret others.  Please give [place] 48 hours in order for your provider to thoroughly review all the results before contacting the office for clarification of your results.   Avoid artificial sweeteners.  Monitor symptoms after lactose consumption.  Follow up pending the results of your Colonoscopy and labs.

## 2020-03-28 LAB — TISSUE TRANSGLUTAMINASE, IGA: (tTG) Ab, IgA: 1 U/mL

## 2020-04-04 ENCOUNTER — Encounter: Payer: Self-pay | Admitting: Internal Medicine

## 2020-04-05 DIAGNOSIS — R918 Other nonspecific abnormal finding of lung field: Secondary | ICD-10-CM | POA: Diagnosis not present

## 2020-04-11 ENCOUNTER — Telehealth: Payer: Self-pay | Admitting: Internal Medicine

## 2020-04-11 NOTE — Telephone Encounter (Signed)
CT chest done in May is sufficient for now. Please cancel the pending CT chest in August.

## 2020-04-11 NOTE — Telephone Encounter (Addendum)
Ok- looks like this was already cancelled  I spoke with the pt and notified of repsponse per Dr Maple Hudson and he verbalized understanding

## 2020-04-11 NOTE — Telephone Encounter (Signed)
See other phone note dated today  Duplicate msg

## 2020-04-11 NOTE — Telephone Encounter (Signed)
Patient contacted that message was forwarded to Dr. Maple Hudson. Please advise.  I have a ct to schedule for 05/2020 put in lat year at 05/2019 looks like the patient had one in 02/2020 do they still need this one

## 2020-04-14 ENCOUNTER — Other Ambulatory Visit: Payer: Self-pay

## 2020-04-14 ENCOUNTER — Ambulatory Visit (AMBULATORY_SURGERY_CENTER): Payer: 59 | Admitting: Internal Medicine

## 2020-04-14 ENCOUNTER — Encounter: Payer: Self-pay | Admitting: Internal Medicine

## 2020-04-14 VITALS — BP 107/69 | HR 69 | Temp 97.1°F | Resp 17 | Ht 79.0 in | Wt 183.0 lb

## 2020-04-14 DIAGNOSIS — R197 Diarrhea, unspecified: Secondary | ICD-10-CM

## 2020-04-14 DIAGNOSIS — K529 Noninfective gastroenteritis and colitis, unspecified: Secondary | ICD-10-CM

## 2020-04-14 DIAGNOSIS — K5730 Diverticulosis of large intestine without perforation or abscess without bleeding: Secondary | ICD-10-CM | POA: Diagnosis not present

## 2020-04-14 MED ORDER — SODIUM CHLORIDE 0.9 % IV SOLN: 500.0000 mL | Freq: Once | INTRAVENOUS | Status: DC

## 2020-04-14 NOTE — Progress Notes (Signed)
PT taken to PACU. Monitors in place. VSS. Report given to RN. 

## 2020-04-14 NOTE — Op Note (Signed)
[facility] Patient Name: Jesse Wong Procedure Date: 04/14/2020 9:16 AM MRN: [id number] Endoscopist: Iva Boop , MD Age: 39 Referring MD:  Date of Birth: 02-01-1981 Gender: Male Account #: [id number] Procedure:                Colonoscopy Indications:              Chronic diarrhea, Clinically significant diarrhea                            of unexplained origin Medicines:                Propofol per Anesthesia, Monitored Anesthesia Care Procedure:                Pre-Anesthesia Assessment:                           - Prior to the procedure, a History and Physical                            was performed, and patient medications and                            allergies were reviewed. The patient's tolerance of                            previous anesthesia was also reviewed. The risks                            and benefits of the procedure and the sedation                            options and risks were discussed with the patient.                            All questions were answered, and informed consent                            was obtained. Prior Anticoagulants: The patient has                            taken no previous anticoagulant or antiplatelet                            agents. ASA Grade Assessment: II - A patient with                            mild systemic disease. After reviewing the risks                            and benefits, the patient was deemed in                            satisfactory condition to undergo the procedure.  After obtaining informed consent, the colonoscope                            was passed under direct vision. Throughout the                            procedure, the patient's blood pressure, pulse, and                            oxygen saturations were monitored continuously. The                            Colonoscope was introduced through the anus and                            advanced to the  the terminal ileum, with                            identification of the appendiceal orifice and IC                            valve. The colonoscopy was performed without                            difficulty. The patient tolerated the procedure                            well. The quality of the bowel preparation was                            good. The terminal ileum, ileocecal valve,                            appendiceal orifice, and rectum were photographed.                            The bowel preparation used was Miralax via split                            dose instruction. Scope In: 9:25:33 AM Scope Out: 9:40:44 AM Scope Withdrawal Time: 0 hours 12 minutes 10 seconds  Total Procedure Duration: 0 hours 15 minutes 11 seconds  Findings:                 The perianal and digital rectal examinations were                            normal. Pertinent negatives include normal prostate                            (size, shape, and consistency).                           The terminal ileum appeared normal.  Multiple small-mouthed diverticula were found in                            the sigmoid colon.                           The exam was otherwise without abnormality on                            direct and retroflexion views.                           Biopsies for histology were taken with a cold                            forceps from the ascending colon, transverse colon,                            descending colon, sigmoid colon and rectum for                            evaluation of microscopic colitis. Complications:            No immediate complications. Estimated Blood Loss:     Estimated blood loss was minimal. Impression:               - The examined portion of the ileum was normal.                           - Diverticulosis in the sigmoid colon.                           - The examination was otherwise normal on direct                            and  retroflexion views.                           - Biopsies were taken with a cold forceps from the                            ascending colon, transverse colon, descending                            colon, sigmoid colon and rectum for evaluation of                            microscopic colitis. Recommendation:           - Patient has a contact number available for                            emergencies. The signs and symptoms of potential                            delayed complications were discussed with the  patient. Return to normal activities tomorrow.                            Written discharge instructions were provided to the                            patient.                           - Resume previous diet.                           - Continue present medications.                           - Repeat colonoscopy is recommended. The                            colonoscopy date will be determined after pathology                            results from today's exam become available for                            review. Iva Boop, MD 04/14/2020 9:50:00 AM This report has been signed electronically.

## 2020-04-14 NOTE — Progress Notes (Signed)
VS by VV 

## 2020-04-14 NOTE — Patient Instructions (Addendum)
I did not see anything unusual - colon mucosa and terminal ileum all look ok. I did take colon biopsies as discussed and will see what they show - odds are normal and this is IBS.  Did see some mild diverticulosis - very common and unlikely to ever be an issue.  I appreciate the opportunity to care for you. Iva Boop, MD, [facility]  Handouts given:  Diverticulosis Resume previous diet  continue current medications Await pathology results YOU HAD AN ENDOSCOPIC PROCEDURE TODAY AT THE [facility]:   Refer to the procedure report that was given to you for any specific questions about what was found during the examination.  If the procedure report does not answer your questions, please call your gastroenterologist to clarify.  If you requested that your care partner not be given the details of your procedure findings, then the procedure report has been included in a sealed envelope for you to review at your convenience later.  YOU SHOULD EXPECT: Some feelings of bloating in the abdomen. Passage of more gas than usual.  Walking can help get rid of the air that was put into your GI tract during the procedure and reduce the bloating. If you had a lower endoscopy (such as a colonoscopy or flexible sigmoidoscopy) you may notice spotting of blood in your stool or on the toilet paper. If you underwent a bowel prep for your procedure, you may not have a normal bowel movement for a few days.  Please Note:  You might notice some irritation and congestion in your nose or some drainage.  This is from the oxygen used during your procedure.  There is no need for concern and it should clear up in a day or so.  SYMPTOMS TO REPORT IMMEDIATELY:   Following lower endoscopy (colonoscopy or flexible sigmoidoscopy):  Excessive amounts of blood in the stool  Significant tenderness or worsening of abdominal pains  Swelling of the abdomen that is new, acute  Fever of 100F or higher   For urgent or  emergent issues, a gastroenterologist can be reached at any hour by calling [phone]. Do not use MyChart messaging for urgent concerns.    DIET:  We do recommend a small meal at first, but then you may proceed to your regular diet.  Drink plenty of fluids but you should avoid alcoholic beverages for 24 hours.  ACTIVITY:  You should plan to take it easy for the rest of today and you should NOT DRIVE or use heavy machinery until tomorrow (because of the sedation medicines used during the test).    FOLLOW UP: Our staff will call the number listed on your records 48-72 hours following your procedure to check on you and address any questions or concerns that you may have regarding the information given to you following your procedure. If we do not reach you, we will leave a message.  We will attempt to reach you two times.  During this call, we will ask if you have developed any symptoms of COVID 19. If you develop any symptoms (ie: fever, flu-like symptoms, shortness of breath, cough etc.) before then, please call [phone].  If you test positive for Covid 19 in the 2 weeks post procedure, please call and report this information to [place].    If any biopsies were taken you will be contacted by phone or by letter within the next 1-3 weeks.  Please call us at [phone] if you have not heard about the biopsies  in 3 weeks.    SIGNATURES/CONFIDENTIALITY: You and/or your care partner have signed paperwork which will be entered into your electronic medical record.  These signatures attest to the fact that that the information above on your After Visit Summary has been reviewed and is understood.  Full responsibility of the confidentiality of this discharge information lies with you and/or your care-partner.

## 2020-04-14 NOTE — Progress Notes (Signed)
Called to room to assist during endoscopic procedure.  Patient ID and intended procedure confirmed with present staff. Received instructions for my participation in the procedure from the performing physician.  

## 2020-04-18 ENCOUNTER — Telehealth: Payer: Self-pay | Admitting: *Deleted

## 2020-04-18 ENCOUNTER — Telehealth: Payer: Self-pay

## 2020-04-18 NOTE — Telephone Encounter (Signed)
No answer for post procedure call back. LEft message for patient to call with questions or concerns. °

## 2020-04-18 NOTE — Telephone Encounter (Signed)
Called #[phone] and left a messaged we tried to reach pt for a follow up call. maw 

## 2020-05-15 ENCOUNTER — Ambulatory Visit (INDEPENDENT_AMBULATORY_CARE_PROVIDER_SITE_OTHER): Payer: 59 | Admitting: Family Medicine

## 2020-05-15 ENCOUNTER — Encounter: Payer: Self-pay | Admitting: Family Medicine

## 2020-05-15 ENCOUNTER — Other Ambulatory Visit: Payer: Self-pay

## 2020-05-15 VITALS — BP 118/79 | HR 55 | Ht 79.0 in | Wt 182.0 lb

## 2020-05-15 DIAGNOSIS — Z0000 Encounter for general adult medical examination without abnormal findings: Secondary | ICD-10-CM

## 2020-05-15 LAB — COMPLETE METABOLIC PANEL WITH GFR
AG Ratio: 1.7 (calc) (ref 1.0–2.5)
ALT: 15 U/L (ref 9–46)
AST: 13 U/L (ref 10–40)
Albumin: 4.5 g/dL (ref 3.6–5.1)
Alkaline phosphatase (APISO): 61 U/L (ref 36–130)
BUN: 18 mg/dL (ref 7–25)
CO2: 30 mmol/L (ref 20–32)
Calcium: 9 mg/dL (ref 8.6–10.3)
Chloride: 101 mmol/L (ref 98–110)
Creat: 0.9 mg/dL (ref 0.60–1.35)
GFR, Est African American: 123 mL/min/{1.73_m2} (ref >=60)
GFR, Est Non African American: 106 mL/min/{1.73_m2} (ref >=60)
Globulin: 2.6 g/dL (calc) (ref 1.9–3.7)
Glucose, Bld: 78 mg/dL (ref 65–99)
Potassium: 4 mmol/L (ref 3.5–5.3)
Sodium: 138 mmol/L (ref 135–146)
Total Bilirubin: 0.9 mg/dL (ref 0.2–1.2)
Total Protein: 7.1 g/dL (ref 6.1–8.1)

## 2020-05-15 LAB — LIPID PANEL
Cholesterol: 148 mg/dL (ref <200)
HDL: 35 mg/dL — ABNORMAL LOW (ref >=40)
LDL Cholesterol (Calc): 93 mg/dL (calc)
Non-HDL Cholesterol (Calc): 113 mg/dL (calc) (ref <130)
Total CHOL/HDL Ratio: 4.2 (calc) (ref <5.0)
Triglycerides: 103 mg/dL (ref <150)

## 2020-05-15 NOTE — Patient Instructions (Signed)
Preventive Care 40-40 Years Old, Male °Preventive care refers to lifestyle choices and visits with your health care provider that can promote health and wellness. This includes: °· A yearly physical exam. This is also called an annual well check. °· Regular dental and eye exams. °· Immunizations. °· Screening for certain conditions. °· Healthy lifestyle choices, such as eating a healthy diet, getting regular exercise, not using drugs or products that contain nicotine and tobacco, and limiting alcohol use. °What can I expect for my preventive care visit? °Physical exam °Your health care provider will check: °· Height and weight. These may be used to calculate body mass index (BMI), which is a measurement that tells if you are at a healthy weight. °· Heart rate and blood pressure. °· Your skin for abnormal spots. °Counseling °Your health care provider may ask you questions about: °· Alcohol, tobacco, and drug use. °· Emotional well-being. °· Home and relationship well-being. °· Sexual activity. °· Eating habits. °· Work and work environment. °What immunizations do I need? ° °Influenza (flu) vaccine °· This is recommended every year. °Tetanus, diphtheria, and pertussis (Tdap) vaccine °· You may need a Td booster every 10 years. °Varicella (chickenpox) vaccine °· You may need this vaccine if you have not already been vaccinated. °Zoster (shingles) vaccine °· You may need this after age 60. °Measles, mumps, and rubella (MMR) vaccine °· You may need at least one dose of MMR if you were born in 1957 or later. You may also need a second dose. °Pneumococcal conjugate (PCV13) vaccine °· You may need this if you have certain conditions and were not previously vaccinated. °Pneumococcal polysaccharide (PPSV23) vaccine °· You may need one or two doses if you smoke cigarettes or if you have certain conditions. °Meningococcal conjugate (MenACWY) vaccine °· You may need this if you have certain conditions. °Hepatitis A  vaccine °· You may need this if you have certain conditions or if you travel or work in places where you may be exposed to hepatitis A. °Hepatitis B vaccine °· You may need this if you have certain conditions or if you travel or work in places where you may be exposed to hepatitis B. °Haemophilus influenzae type b (Hib) vaccine °· You may need this if you have certain risk factors. °Human papillomavirus (HPV) vaccine °· If recommended by your health care provider, you may need three doses over 6 months. °You may receive vaccines as individual doses or as more than one vaccine together in one shot (combination vaccines). Talk with your health care provider about the risks and benefits of combination vaccines. °What tests do I need? °Blood tests °· Lipid and cholesterol levels. These may be checked every 5 years, or more frequently if you are over 50 years old. °· Hepatitis C test. °· Hepatitis B test. °Screening °· Lung cancer screening. You may have this screening every year starting at age 55 if you have a 30-pack-year history of smoking and currently smoke or have quit within the past 15 years. °· Prostate cancer screening. Recommendations will vary depending on your family history and other risks. °· Colorectal cancer screening. All adults should have this screening starting at age 50 and continuing until age 75. Your health care provider may recommend screening at age 45 if you are at increased risk. You will have tests every 1-10 years, depending on your results and the type of screening test. °· Diabetes screening. This is done by checking your blood sugar (glucose) after you have not eaten   for a while (fasting). You may have this done every 1-3 years.  Sexually transmitted disease (STD) testing. Follow these instructions at home: Eating and drinking  Eat a diet that includes fresh fruits and vegetables, whole grains, lean protein, and low-fat dairy products.  Take vitamin and mineral supplements as  recommended by your health care provider.  Do not drink alcohol if your health care provider tells you not to drink.  If you drink alcohol: ? Limit how much you have to 0-2 drinks a day. ? Be aware of how much alcohol is in your drink. In the [place] one drink equals one 12 oz bottle of beer (355 mL), one 5 oz glass of wine (148 mL), or one 1 oz glass of hard liquor (44 mL). Lifestyle  Take daily care of your teeth and gums.  Stay active. Exercise for at least 30 minutes on 5 or more days each week.  Do not use any products that contain nicotine or tobacco, such as cigarettes, e-cigarettes, and chewing tobacco. If you need help quitting, ask your health care provider.  If you are sexually active, practice safe sex. Use a condom or other form of protection to prevent STIs (sexually transmitted infections).  Talk with your health care provider about taking a low-dose aspirin every day starting at age 50. What's next?  Go to your health care provider once a year for a well check visit.  Ask your health care provider how often you should have your eyes and teeth checked.  Stay up to date on all vaccines. This information is not intended to replace advice given to you by your health care provider. Make sure you discuss any questions you have with your health care provider. Document Revised: 10/01/2018 Document Reviewed: 10/01/2018 Elsevier Patient Education  2020 Elsevier Inc.  

## 2020-05-15 NOTE — Progress Notes (Signed)
CPE  Established Patient Office Visit  Subjective:  Patient ID: Jesse Wong, male    DOB: 05-29-1981  Age: 39 y.o. MRN: [id number]  CC:  Chief Complaint  Patient presents with  . Annual Exam    HPI Jesse Wong presents for CPE.  He is doing well overall.  He recently had his colonoscopy on June 25 and got good results back on that.  He is exercising 3 days/week.  Sleep is okay usually gets about 7 hours.  He still has some occasional flares with his angitis.  He will get pain around the sacral area usually once a year that last for a few weeks.  Aleve does seem to help.  Extra cushions etc. do not seem to provide any relief.  He has had his Covid vaccination.  Past Medical History:  Diagnosis Date  . Allergy   . Arthritis   . Asthma   . Chronic prostatitis   . Myalgia   . Pulmonary infiltrates   . Sinusitis, chronic   . Tonsillar abscess   . Vasculitis [facility])     Past Surgical History:  Procedure Laterality Date  . BRONCHOSCOPY  2011   TBBX ----inflammation  . IR FLUORO GUIDE CV LINE RIGHT  01/31/2017  . IR FLUORO GUIDE CV MIDLINE PICC RIGHT  01/30/2017  . IR US GUIDE VASC ACCESS RIGHT  01/30/2017  . peritonsilar abscess    . TYMPANOPLASTY    . VIDEO BRONCHOSCOPY Bilateral 06/25/2018   Procedure: VIDEO BRONCHOSCOPY WITHOUT FLUORO;  Surgeon: Coralyn Helling, MD;  Location: [facility];  Service: Cardiopulmonary;  Laterality: Bilateral;    Family History  Problem Relation Age of Onset  . Heart attack Other        grandmother  . Hyperlipidemia Father   . Thyroid disease Father        hashimoto's thyroidistis  . Heart disease Paternal Grandfather   . Colon cancer Neg Hx   . Stomach cancer Neg Hx   . Rectal cancer Neg Hx   . Esophageal cancer Neg Hx     Social History   Socioeconomic History  . Marital status: Married    Spouse name: Not on file  . Number of children: 1  . Years of education: Not on file  . Highest education level: Not on file   Occupational History  . Occupation: Nuclear Medicine    Employer: [employer]  Tobacco Use  . Smoking status: Never Smoker  . Smokeless tobacco: Never Used  Vaping Use  . Vaping Use: Never used  Substance and Sexual Activity  . Alcohol use: Yes    Comment: occasional  . Drug use: No  . 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  Other Topics Concern  . Not on file  Social History Narrative  . Not on file   Social Determinants of Health   Financial Resource Strain:   . Difficulty of Paying Living Expenses:   Food Insecurity:   . Worried About Programme researcher, broadcasting/film/video in the Last Year:   . Barista in the Last Year:   Transportation Needs:   . Freight forwarder (Medical):   [name] Lack of Transportation (Non-Medical):   Physical Activity:   . Days of Exercise per Week:   . Minutes of Exercise per Session:   Stress:   . Feeling of Stress :   Social Connections:   . Frequency of Communication  with Friends and Family:   . Frequency of Social Gatherings with Friends and Family:   . Attends Religious Services:   . Active Member of Clubs or Organizations:   . Attends Banker Meetings:   [name] Marital Status:   Intimate Partner Violence:   . Fear of Current or Ex-Partner:   . Emotionally Abused:   [name] Physically Abused:   . Sexually Abused:     Outpatient Medications Prior to Visit  Medication Sig Dispense Refill  . albuterol (2.5 MG/3ML) 0.083% NEBU 3 mL, albuterol (5 MG/ML) 0.5% NEBU 0.5 mL Inhale into the lungs as needed.    [name] albuterol (VENTOLIN HFA) 108 (90 Base) MCG/ACT inhaler Inhale 2 puffs into the lungs every 6 (six) hours as needed for wheezing or shortness of breath. 1 Inhaler 5  . cetirizine (ZYRTEC) 10 MG tablet Take 10 mg by mouth daily.    . cholecalciferol (VITAMIN D) 1000 UNITS tablet Take 1 tablet (1,000 Units total) by mouth daily. 30 tablet 6  . DULERA 200-5 MCG/ACT AERO INHALE 2  PUFFS INTO THE LUNGS 2 TIMES DAILY. (Patient taking differently: Inhale 2 puffs into the lungs 2 (two) times daily as needed. ) 13 g 11  . fluticasone (FLONASE) 50 MCG/ACT nasal spray INSTILL 2 SPRAYS INTO BOTH NOSTRILS DAILY (NEED APPT FOR FURTHER REFILLS) 16 g 0  . ipratropium (ATROVENT) 0.06 % nasal spray Place 2 sprays into both nostrils every 4 (four) hours as needed for rhinitis. 10 mL 6  . Nebulizers (COMPRESSOR/NEBULIZER) MISC Use as direccted 1 each 0  . pravastatin (PRAVACHOL) 20 MG tablet Take 1 tablet (20 mg total) by mouth daily. 90 tablet 3   No facility-administered medications prior to visit.    Allergies  Allergen Reactions  . Lipitor [Atorvastatin] Other (See Comments)    myalgias    ROS Review of Systems    Objective:    Physical Exam Constitutional:      Appearance: He is well-developed.  HENT:     Head: Normocephalic and atraumatic.     Right Ear: External ear normal.     Left Ear: External ear normal.     Nose: Nose normal.  Eyes:     Conjunctiva/sclera: Conjunctivae normal.     Pupils: Pupils are equal, round, and reactive to light.  Neck:     Thyroid: No thyromegaly.  Cardiovascular:     Rate and Rhythm: Normal rate and regular rhythm.     Heart sounds: Normal heart sounds.  Pulmonary:     Effort: Pulmonary effort is normal.     Breath sounds: Normal breath sounds.  Abdominal:     General: Bowel sounds are normal. There is no distension.     Palpations: Abdomen is soft. There is no mass.     Tenderness: There is no abdominal tenderness. There is no guarding or rebound.  Musculoskeletal:        General: Normal range of motion.     Cervical back: Normal range of motion and neck supple.  Lymphadenopathy:     Cervical: No cervical adenopathy.  Skin:    General: Skin is warm and dry.  Neurological:     Mental Status: He is alert and oriented to person, place, and time.     Deep Tendon Reflexes: Reflexes are normal and symmetric.  Psychiatric:         Behavior: Behavior normal.        Thought Content: Thought content normal.  Judgment: Judgment normal.     BP 118/79   Pulse 55   Ht 6\' 7"  (2.007 m)   Wt 182 lb (82.6 kg)   SpO2 99%   BMI 20.50 kg/m  Wt Readings from Last 3 Encounters:  05/15/20 182 lb (82.6 kg)  04/14/20 183 lb (83 kg)  03/27/20 183 lb 4 oz (83.1 kg)     Health Maintenance Due  Topic Date Due  . HIV Screening  Never done    There are no preventive care reminders to display for this patient.  Lab Results  Component Value Date   TSH 3.75 04/12/2019   Lab Results  Component Value Date   WBC 8.0 03/27/2020   HGB 17.1 (H) 03/27/2020   HCT 50.2 03/27/2020   MCV 92.8 03/27/2020   PLT 233.0 03/27/2020   Lab Results  Component Value Date   NA 138 04/12/2019   K 4.1 04/12/2019   CO2 31 04/12/2019   GLUCOSE 88 04/12/2019   BUN 12 04/12/2019   CREATININE 0.96 04/12/2019   BILITOT 1.0 04/12/2019   ALKPHOS 80 12/16/2017   AST 16 04/12/2019   ALT 14 04/12/2019   PROT 7.3 04/12/2019   ALBUMIN 4.3 12/16/2017   CALCIUM 9.6 04/12/2019   ANIONGAP 14 12/16/2017   Lab Results  Component Value Date   CHOL 198 04/12/2019   Lab Results  Component Value Date   HDL 33 (L) 04/12/2019   Lab Results  Component Value Date   LDLCALC 131 (H) 04/12/2019   Lab Results  Component Value Date   TRIG 198 (H) 04/12/2019   Lab Results  Component Value Date   CHOLHDL 6.0 (H) 04/12/2019   No results found for: HGBA1C    Assessment & Plan:   Problem List Items Addressed This Visit    None    Visit Diagnoses    Wellness examination    -  Primary   Relevant Orders   COMPLETE METABOLIC PANEL WITH GFR   Lipid panel     Keep up a regular exercise program and make sure you are eating a healthy diet Try to eat 4 servings of dairy a day, or if you are lactose intolerant take a calcium with vitamin D daily.  Your vaccines are up to date.    No orders of the defined types were placed in this  encounter.   Follow-up: No follow-ups on file.    04/14/2019, MD

## 2020-06-05 ENCOUNTER — Encounter: Payer: Self-pay | Admitting: Internal Medicine

## 2020-06-05 ENCOUNTER — Ambulatory Visit: Payer: 59 | Admitting: Internal Medicine

## 2020-06-05 ENCOUNTER — Other Ambulatory Visit: Payer: Self-pay

## 2020-06-05 VITALS — BP 110/60 | HR 54 | Temp 97.3°F | Ht 79.0 in | Wt 184.7 lb

## 2020-06-05 DIAGNOSIS — J324 Chronic pansinusitis: Secondary | ICD-10-CM | POA: Diagnosis not present

## 2020-06-05 DIAGNOSIS — M3130 Wegener's granulomatosis without renal involvement: Secondary | ICD-10-CM | POA: Diagnosis not present

## 2020-06-05 DIAGNOSIS — R918 Other nonspecific abnormal finding of lung field: Secondary | ICD-10-CM

## 2020-06-05 NOTE — Progress Notes (Signed)
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 Presented 2011 with nodular infiltrates and progressive weakness/neuropathy. Bronchoscoped 2011- Neg.  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 FVC 4.91/73%, FEV1 4.19/83%, FEV1/FVC 0.85, FEF 25-75% 0.85, TLC 78%, DLCO 113% PFT 06/08/2014-normal lung volumes, minimal slowing and small airway flows with response to bronchodilator, normal diffusion CXR 08/30/2014-Emphysematous and bronchitic changes consistent with COPD. Bronchoscopy 2019-Dr. Sood-BAL-normal flora.  Aspergillus Ag BAL 0.10 WNL, fungal cultures negative, AFB neg. cytology benign. PFT [facility], 03/10/2019 without interpretation- numerical results scanned to media. 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 Presented 2011 with nodular infiltrates and progressive weakness/neuropathy. Saw Dr Posey Pronto [facility] Pulmonary who did PFT 5/20, at a time of feeling well. There are small decreases in scores from our last PFT, but no interpretation is found. Dr Posey Pronto is noted to be an expert on pulmonary infections in the context of unusual pulmonary disorders. He suggest that before any more systemic immunosuppressive therapy, another bronchoscopy for further  cultures be considered.  -----f/u Wegeners Granulomatosis  Had f/u with Dr Dawson Bills in Sept, deciding to continue following annual CT,watching for evolution. Intent would be to repeat bronchoscopy for culture before immunotherapy, esp looking for MAIC/ atypicals, and to reserve macrolides incase needed for those in future.  Neb albuterol, albuterol hfa, atrovent nasal spray Fortunately he reports he continues to do very well off antiinflammatory meds and antibiotics with no significant exacerbation. Had both Covid vax and Flu vax. 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 No acute needs.  Observations/Objective: Pending f/u CT chest 05/2020  Assessment and Plan: Granulomatous lung inflammation, possibly Wegener's- management per Dr [name].  F/U chest CT is scheduled for May. Bronch for cultures/ sens and possible referral to eg [facility] in case of relapse.  Chronic bronchitis- not needing bronchodilators currently and continues in remission To save macrolides in case of MAIC.  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 Presented 2011 with nodular infiltrates and progressive weakness/neuropathy. Parallel f/u with Dr Patel/ Pulmonary _0 - agrees with conservative plan for annual CT, holding bronch searching for NTM pending progression. Advised staying off macrolides in case needed for NTM in future. Dulera 200, Albuterol hfa, neb albuterol, ipratropium 0.06% nasal Had 2 Phizer Covax Slow increase nasal congestion as he continues flonase. Not aware of polyps. No productive cough. No sweats/ fever. Following up with Dr [name] Rheumatology for hip/ low back pain, anticipating  single dose of high dose solumedrol which we discussed for potential to aggravate low-grade pulmonary infection if present.  CT chest High Res- 03/08/20- 1. No evidence of fibrotic interstitial lung disease. 2. 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 3. Minimal air trapping is indicative of small airways disease.  ROS-see HPI + =  positive Constitutional:   No-   weight loss, night sweats, fevers, chills, fatigue, lassitude. HEENT:   No-  headaches, difficulty swallowing, tooth/dental problems, sore throat,       No-  sneezing, itching, +ear ache,+ nasal congestion, post nasal drip,  CV:  No-   chest pain, orthopnea, PND, swelling in lower extremities, anasarca,                                                    dizziness, palpitations Resp: shortness of breath with exertion or at rest.              No-   productive cough,  + non-productive cough,  No- coughing up of blood.              No-   change in color of mucus.  + wheezing.   Skin: No-   rash or lesions. GI:  No-   heartburn, indigestion, abdominal pain, nausea, vomiting,  GU:  MS:  +  joint pain or swelling.   Neuro-     nothing unusual Psych:  No- change in mood or affect. No depression or anxiety.  No memory loss.  OBJ- Physical Exam General- Alert, Oriented, Affect-appropriate, Distress- none acute,+ tall, thin, + looks well today Skin- no rash Lymphadenopathy- none Head- atraumatic            Eyes- Gross vision intact, PERRLA, conjunctivae and secretions clear            Ears- Hearing, canals-normal            Nose- + marked turbinate edema, no-Septal dev, mucus, polyps, erosion, perforation             Throat- Mallampati II , mucosa clear , drainage- none, tonsils- atrophic Neck- flexible , trachea midline, no stridor , thyroid nl, carotid no bruit Chest - symmetrical excursion , unlabored            Heart/CV- RRR , no murmur , no gallop  , no rub, nl s1 s2                           - JVD- none , edema- none, stasis changes- none, varices- none           Lung- + clear,  wheeze-none, cough-none , dullness-none, rub- none,            Chest wall-  Abd-  Br/ Gen/ Rectal- Not done, not indicated Extrem- cyanosis- none, clubbing, none, atrophy- none, strength- nl Neuro- grossly intact to observation

## 2020-06-05 NOTE — Patient Instructions (Signed)
Order- schedule CT chest, high resolution, no contrast     For May, 2022       Dx pulmonary vascular granulomatosis (Wegener's)  Please call if we can help

## 2020-06-06 DIAGNOSIS — L4059 Other psoriatic arthropathy: Secondary | ICD-10-CM | POA: Diagnosis not present

## 2020-06-06 DIAGNOSIS — Z7952 Long term (current) use of systemic steroids: Secondary | ICD-10-CM | POA: Diagnosis not present

## 2020-06-06 DIAGNOSIS — M2550 Pain in unspecified joint: Secondary | ICD-10-CM | POA: Diagnosis not present

## 2020-06-06 DIAGNOSIS — M545 Low back pain: Secondary | ICD-10-CM | POA: Diagnosis not present

## 2020-06-06 DIAGNOSIS — M317 Microscopic polyangiitis: Secondary | ICD-10-CM | POA: Diagnosis not present

## 2020-06-06 DIAGNOSIS — Z6821 Body mass index (BMI) 21.0-21.9, adult: Secondary | ICD-10-CM | POA: Diagnosis not present

## 2020-06-06 DIAGNOSIS — B449 Aspergillosis, unspecified: Secondary | ICD-10-CM | POA: Diagnosis not present

## 2020-06-12 MED FILL — PRAVASTATIN SODIUM 20 MG TA: 20 | 90 days supply | Qty: 90 | Fill #1

## 2020-06-13 NOTE — Assessment & Plan Note (Signed)
Watching for nasal polyps, chronic sinusitis, presume relation to pulmonary process with microangiopathy.

## 2020-06-13 NOTE — Assessment & Plan Note (Addendum)
He looks well and chest is clear on exam today. Plan- continue observation with option to bronch if progression, looking for NTM. Dr Dierdre Forth following arthritis. F/u CT chest next year.

## 2020-06-13 NOTE — Assessment & Plan Note (Signed)
Continuing to watch as noted.

## 2020-06-15 ENCOUNTER — Telehealth: Payer: Self-pay | Admitting: Family Medicine

## 2020-06-15 ENCOUNTER — Encounter: Payer: Self-pay | Admitting: Family Medicine

## 2020-06-15 NOTE — Telephone Encounter (Signed)
Erorr  

## 2020-06-15 NOTE — Telephone Encounter (Signed)
Patient calling in wanting to know if an in person visit would be best. States that he hit his nose on metal and it is swollen. Also has fevers in the afternoon of 100-103 that have been going on. He is taking max dosage of ibuprofen, and covid tested and it was negative. Also wanted to discuss a referral for sacrum pain. I put him for a virtual tomorrow since he only wants to see PCP, but if in person is better please let us know.

## 2020-06-15 NOTE — Telephone Encounter (Signed)
OK for in person since COVID neg

## 2020-06-16 ENCOUNTER — Other Ambulatory Visit: Payer: Self-pay

## 2020-06-16 ENCOUNTER — Encounter: Payer: Self-pay | Admitting: Family Medicine

## 2020-06-16 ENCOUNTER — Ambulatory Visit (INDEPENDENT_AMBULATORY_CARE_PROVIDER_SITE_OTHER): Payer: 59 | Admitting: Family Medicine

## 2020-06-16 ENCOUNTER — Ambulatory Visit (INDEPENDENT_AMBULATORY_CARE_PROVIDER_SITE_OTHER): Payer: 59

## 2020-06-16 VITALS — BP 127/64 | HR 74 | Temp 99.1°F | Ht 79.0 in | Wt 180.0 lb

## 2020-06-16 DIAGNOSIS — R058 Other specified cough: Secondary | ICD-10-CM

## 2020-06-16 DIAGNOSIS — J189 Pneumonia, unspecified organism: Secondary | ICD-10-CM | POA: Diagnosis not present

## 2020-06-16 DIAGNOSIS — M317 Microscopic polyangiitis: Secondary | ICD-10-CM

## 2020-06-16 DIAGNOSIS — M533 Sacrococcygeal disorders, not elsewhere classified: Secondary | ICD-10-CM | POA: Insufficient documentation

## 2020-06-16 DIAGNOSIS — R0602 Shortness of breath: Secondary | ICD-10-CM

## 2020-06-16 DIAGNOSIS — R05 Cough: Secondary | ICD-10-CM

## 2020-06-16 DIAGNOSIS — S0992XA Unspecified injury of nose, initial encounter: Secondary | ICD-10-CM | POA: Diagnosis not present

## 2020-06-16 DIAGNOSIS — R509 Fever, unspecified: Secondary | ICD-10-CM

## 2020-06-16 HISTORY — DX: Unspecified injury of nose, initial encounter: S09.92XA

## 2020-06-16 MED ORDER — AMOXICILLIN-POT CLAVULANATE 875-125 MG PO TABS: 1.0000 | ORAL_TABLET | Freq: Two times a day (BID) | ORAL | 0 refills | Status: DC

## 2020-06-16 NOTE — Progress Notes (Signed)
Established Patient Office Visit  Subjective:  Patient ID: Jesse Wong, male    DOB: 1981-05-08  Age: 39 y.o. MRN: [id number]  CC: No chief complaint on file.   HPI Jesse Wong presents for a couple of issues today.  Starting last Sunday he started running a temperature the highest is been around 103.7 it occurred almost every afternoon since then.  He is experiencing a dry cough.  No burning in the chest.  He says he does feel short of breath like is just hard to get a deep breath at times.  He had a CT of the chest back in May.  He says that when he is running a fever the shortness of breath and cough actually worsen.  He has been taking max dose ibuprofen 4 times a day and that does seem to help control the fevers.  He has not had any GI symptoms.  Maybe a little sore throat today.  No significant headaches except for when he is running a fever.  He did reach out to his specialist at [facility] and they recommended not using azithromycin. No known sick contacts but daughter went back to school last week.   He also reports that on Wednesday he was actually trying to adjust his computer monitor which is quite large on the mounted arm and the piece of metal fell forward and actually hit him on the left side of his nose.  He has noticed that the cartilage has shifted in the tip of his nose to the right and he does have a red swollen area at the base of the nose on the left.  He said initially he had some sore soreness in his front teeth but that is actually gradually gotten better.  He did have a nosebleed immediately afterwards.  Instead in fact it was so painful it almost felt like it had severed his nose during initial injury.  He also reports for about the last week or so he has been having pain at the coccyx.  He says that he did mention it to his rheumatologist and they did x-rays that did not see fracture dislocation etc.  He did receive a Depo shot and says that he has had some  improvement in pain he is also been trying to stand more and keep pressure off of it.  He denies any injury or trauma admits for started when he was leaning back in bed watching a movie.  No old injury or trauma to the coccyx or sacrum that he is aware of.  Past Medical History:  Diagnosis Date  . Allergy   . Arthritis   . Asthma   . Chronic prostatitis   . Myalgia   . Pulmonary infiltrates   . Sinusitis, chronic   . Tonsillar abscess   . Vasculitis [facility])     Past Surgical History:  Procedure Laterality Date  . BRONCHOSCOPY  2011   TBBX ----inflammation  . IR FLUORO GUIDE CV LINE RIGHT  01/31/2017  . IR FLUORO GUIDE CV MIDLINE PICC RIGHT  01/30/2017  . IR US GUIDE VASC ACCESS RIGHT  01/30/2017  . peritonsilar abscess    . TYMPANOPLASTY    . VIDEO BRONCHOSCOPY Bilateral 06/25/2018   Procedure: VIDEO BRONCHOSCOPY WITHOUT FLUORO;  Surgeon: Coralyn Helling, MD;  Location: [facility] END[facility]COPY;  Service: Cardiopulmonary;  Laterality: Bilateral;    Family History  Problem Relation Age of Onset  . Heart attack Other  grandmother  . Hyperlipidemia Father   . Thyroid disease Father        hashimoto's thyroidistis  . Heart disease Paternal Grandfather   . Colon cancer Neg Hx   . Stomach cancer Neg Hx   . Rectal cancer Neg Hx   . Esophageal cancer Neg Hx     Social History   Socioeconomic History  . Marital status: Married    Spouse name: Not on file  . Number of children: 1  . Years of education: Not on file  . Highest education level: Not on file  Occupational History  . Occupation: Nuclear Medicine    Employer: [employer]  Tobacco Use  . Smoking status: Never Smoker  . Smokeless tobacco: Never Used  Vaping Use  . Vaping Use: Never used  Substance and Sexual Activity  . Alcohol use: Yes    Comment: occasional  . Drug use: No  . 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  Other Topics Concern  . Not on file   Social History Narrative  . Not on file   Social Determinants of Health   Financial Resource Strain:   . Difficulty of Paying Living Expenses: Not on file  Food Insecurity:   . Worried About Programme researcher, broadcasting/film/videounning Out of Food in the Last Year: Not on file  . Ran Out of Food in the Last Year: Not on file  Transportation Needs:   . Lack of Transportation (Medical): Not on file  . Lack of Transportation (Non-Medical): Not on file  Physical Activity:   . Days of Exercise per Week: Not on file  . Minutes of Exercise per Session: Not on file  Stress:   . Feeling of Stress : Not on file  Social Connections:   . Frequency of Communication with Friends and Family: Not on file  . Frequency of Social Gatherings with Friends and Family: Not on file  . Attends Religious Services: Not on file  . Active Member of Clubs or Organizations: Not on file  . Attends BankerClub or Organization Meetings: Not on file  . Marital Status: Not on file  Intimate Partner Violence:   . Fear of Current or Ex-Partner: Not on file  . Emotionally Abused: Not on file  . Physically Abused: Not on file  . Sexually Abused: Not on file    Outpatient Medications Prior to Visit  Medication Sig Dispense Refill  . albuterol (2.5 MG/3ML) 0.083% NEBU 3 mL, albuterol (5 MG/ML) 0.5% NEBU 0.5 mL Inhale into the lungs as needed.    [name]. albuterol (VENTOLIN HFA) 108 (90 Base) MCG/ACT inhaler Inhale 2 puffs into the lungs every 6 (six) hours as needed for wheezing or shortness of breath. 1 Inhaler 5  . cetirizine (ZYRTEC) 10 MG tablet Take 10 mg by mouth daily.    . cholecalciferol (VITAMIN D) 1000 UNITS tablet Take 1 tablet (1,000 Units total) by mouth daily. 30 tablet 6  . DULERA 200-5 MCG/ACT AERO INHALE 2 PUFFS INTO THE LUNGS 2 TIMES DAILY. (Patient taking differently: Inhale 2 puffs into the lungs 2 (two) times daily as needed. ) 13 g 11  . fluticasone (FLONASE) 50 MCG/ACT nasal spray INSTILL 2 SPRAYS INTO BOTH NOSTRILS DAILY (NEED APPT FOR  FURTHER REFILLS) 16 g 0  . ipratropium (ATROVENT) 0.06 % nasal spray Place 2 sprays into both nostrils every 4 (four) hours as needed for rhinitis. 10 mL 6  . Nebulizers (COMPRESSOR/NEBULIZER) MISC  Use as direccted 1 each 0  . pravastatin (PRAVACHOL) 20 MG tablet Take 1 tablet (20 mg total) by mouth daily. 90 tablet 3   No facility-administered medications prior to visit.    Allergies  Allergen Reactions  . Lipitor [Atorvastatin] Other (See Comments)    myalgias    ROS Review of Systems    Objective:    Physical Exam Constitutional:      Appearance: He is well-developed.  HENT:     Head: Normocephalic and atraumatic.     Right Ear: Tympanic membrane, ear canal and external ear normal.     Left Ear: Tympanic membrane, ear canal and external ear normal.     Nose:     Comments: The nose now curves to the right and the tip is more prominent.  There is erythema and swelling at the opening of the nostril  On the left.  The nasal bridge is nontender without  Crepitus     Mouth/Throat:     Mouth: Mucous membranes are moist.     Pharynx: Oropharynx is clear. No posterior oropharyngeal erythema.     Comments: Geographic tongue Eyes:     Conjunctiva/sclera: Conjunctivae normal.     Pupils: Pupils are equal, round, and reactive to light.  Neck:     Thyroid: No thyromegaly.  Cardiovascular:     Rate and Rhythm: Normal rate.     Heart sounds: Normal heart sounds.  Pulmonary:     Effort: Pulmonary effort is normal.     Breath sounds: Normal breath sounds.  Musculoskeletal:     Cervical back: Neck supple.  Lymphadenopathy:     Cervical: No cervical adenopathy.  Skin:    General: Skin is warm and dry.  Neurological:     Mental Status: He is alert and oriented to person, place, and time.  Psychiatric:        Mood and Affect: Mood normal.     BP 127/64   Pulse 74   Temp 99.1 F (37.3 C)   Ht 6\' 7"  (2.007 m)   Wt 180 lb (81.6 kg)   SpO2 100%   BMI 20.28 kg/m  Wt Readings  from Last 3 Encounters:  06/16/20 180 lb (81.6 kg)  06/05/20 184 lb 11.2 oz (83.8 kg)  05/15/20 182 lb (82.6 kg)     Health Maintenance Due  Topic Date Due  . HIV Screening  Never done  . INFLUENZA VACCINE  05/21/2020    There are no preventive care reminders to display for this patient.  Lab Results  Component Value Date   TSH 3.75 04/12/2019   Lab Results  Component Value Date   WBC 8.8 06/16/2020   HGB 14.8 06/16/2020   HCT 42.9 06/16/2020   MCV 92.1 06/16/2020   PLT 319 06/16/2020   Lab Results  Component Value Date   NA 137 06/16/2020   K 4.1 06/16/2020   CO2 28 06/16/2020   GLUCOSE 94 06/16/2020   BUN 17 06/16/2020   CREATININE 0.87 06/16/2020   BILITOT 0.9 06/16/2020   ALKPHOS 80 12/16/2017   AST 31 06/16/2020   ALT 56 (H) 06/16/2020   PROT 7.4 06/16/2020   ALBUMIN 4.3 12/16/2017   CALCIUM 8.7 06/16/2020   ANIONGAP 14 12/16/2017   Lab Results  Component Value Date   CHOL 148 05/15/2020   Lab Results  Component Value Date   HDL 35 (L) 05/15/2020   Lab Results  Component Value Date   LDLCALC 93 05/15/2020  Lab Results  Component Value Date   TRIG 103 05/15/2020   Lab Results  Component Value Date   CHOLHDL 4.2 05/15/2020   No results found for: HGBA1C    Assessment & Plan:   Problem List Items Addressed This Visit      Cardiovascular and Mediastinum   Microscopic polyangiitis (HCC)   Relevant Orders   COMPLETE METABOLIC PANEL WITH GFR (Completed)   CBC with Differential/Platelet (Completed)   Sedimentation rate (Completed)   DG Chest 2 View (Completed)   RESPIRATORY VIRUS PCR, PANEL     Other   Nose injury, initial encounter    Worrisome for cartilage damage. Will refer to ENT.       Relevant Orders   Ambulatory referral to ENT   Fever - Primary    respiratory panel collected. Check CBC, etc.  Check inflammatory marker since hx of Microscopic polyangiitis.  Will cover with Augmentin until results return.      Relevant  Orders   COMPLETE METABOLIC PANEL WITH GFR (Completed)   CBC with Differential/Platelet (Completed)   Sedimentation rate (Completed)   DG Chest 2 View (Completed)   RESPIRATORY VIRUS PCR, PANEL   Coccygeal pain    Had normal xray to fracture is ruled out.  Likely ligamentous injury. Recommend avoid pressure on th area, ice, and NSAID. Consider referral to sports med or PT if not improving.        Other Visit Diagnoses    Dry cough       Relevant Orders   COMPLETE METABOLIC PANEL WITH GFR (Completed)   CBC with Differential/Platelet (Completed)   Sedimentation rate (Completed)   DG Chest 2 View (Completed)   RESPIRATORY VIRUS PCR, PANEL   SOB (shortness of breath)       Relevant Orders   COMPLETE METABOLIC PANEL WITH GFR (Completed)   CBC with Differential/Platelet (Completed)   Sedimentation rate (Completed)   DG Chest 2 View (Completed)   RESPIRATORY VIRUS PCR, PANEL   Pneumonia of left upper lobe due to infectious organism       Relevant Medications   amoxicillin-clavulanate (AUGMENTIN) 875-125 MG tablet     Cough - will get CXR today.  Lung exam is normal.  X-ray consistent with left upper lobe pneumonia.  Azithromycin added to regimen.  Will contact pulmonology.  Continue symptomatic care.  Meds ordered this encounter  Medications  . amoxicillin-clavulanate (AUGMENTIN) 875-125 MG tablet    Sig: Take 1 tablet by mouth 2 (two) times daily.    Dispense:  20 tablet    Refill:  0    Follow-up: Return if symptoms worsen or fail to improve.   Time spent in encounter and reviewing notes 32 minutes.  Nani Gasser, MD

## 2020-06-16 NOTE — Assessment & Plan Note (Signed)
Had normal xray to fracture is ruled out.  Likely ligamentous injury. Recommend avoid pressure on th area, ice, and NSAID. Consider referral to sports med or PT if not improving.

## 2020-06-16 NOTE — Assessment & Plan Note (Signed)
Worrisome for cartilage damage. Will refer to ENT.

## 2020-06-16 NOTE — Patient Instructions (Addendum)
Tailbone Injury  The tailbone (coccyx) is the small bone at the lower end of the spine. A tailbone injury may involve stretched ligaments, bruising, or a broken bone (fracture). Tailbone injuries can be painful, and some may take a long time to heal. What are the causes? This condition may be caused by:  Falling and landing on the tailbone.  Repeated strain or friction from sitting for long periods of time. This may include actions such as rowing and bicycling.  Childbirth. In some cases, the cause may not be known. What are the signs or symptoms? Symptoms of this condition include:  Pain in the tailbone area or lower back, especially when sitting.  Pain or difficulty when standing up from a sitting position.  Bruising or swelling in the tailbone area.  Painful bowel movements.  In women, pain during intercourse. How is this diagnosed? This condition may be diagnosed based on:  Your symptoms.  A physical exam. If your health care provider suspects a fracture, you may have additional tests, such as:  X-rays.  CT scan.  MRI. How is this treated? Most tailbone injuries heal on their own in 4-6 weeks. However, recovery time may be longer if the injury involves a fracture. Treatment for this condition may include:  NSAIDs or other over-the-counter medicines to help relieve your pain.  Using a large, rubber or inflated ring or cushion to take pressure off the tailbone when sitting.  Physical therapy.  Injecting the tailbone area with local anesthesia and steroid medicine. This is not normally needed unless the pain does not improve over time with over-the-counter pain medicines. Follow these instructions at home: Activity  Avoid sitting for long periods of time.  To prevent repeating an injury that is caused by strain or friction: ? Wear appropriate padding and sports gear when bicycling and rowing.  Increase your activity as the pain allows. Perform any exercises  that are recommended by your health care provider or physical therapist. Managing pain, stiffness, and swelling  To help decrease discomfort when sitting: ? Sit on your rubber or inflated ring or cushion as told by your health care provider. ? Lean forward when you sit.  If directed, apply ice to the injured area: ? Put ice in a plastic bag. ? Place a towel between your skin and the bag. ? Leave the ice on for 20 minutes, 2-3 times per day for the first 1-2 days.  If directed, apply heat to the affected area as often as told by your health care provider. Use the heat source that your health care provider recommends, such as a moist heat pack or a heating pad. ? Place a towel between your skin and the heat source. ? Leave the heat on for 20-30 minutes. ? Remove the heat if your skin turns bright red. This is especially important if you are unable to feel pain, heat, or cold. You may have a greater risk of getting burned. General instructions  Take over-the-counter and prescription medicines only as told by your health care provider.  To prevent or treat constipation or painful bowel movements, your health care provider may recommend that you: ? Drink enough fluid to keep your urine pale yellow. ? Eat foods that are high in fiber, such as fresh fruits and vegetables, whole grains, and beans. ? Limit foods that are high in fat and processed sugars, such as fried and sweet foods. ? Take an over-the-counter or prescription medicine for constipation.  Keep all follow-up visits as   directed by your health care provider. This is important. Contact a health care provider if:  Your pain becomes worse or is not controlled with medicine.  Your bowel movements cause a great deal of discomfort.  You are unable to have a bowel movement after 4 days.  You have pain during intercourse. Summary  A tailbone injury may involve stretched ligaments, bruising, or a broken bone (fracture).  Tailbone  injuries can be painful. Most heal on their own in 4-6 weeks.  Treatment may include taking NSAIDs, using a rubber or inflated ring or cushion when sitting, and physical therapy.  Follow any recommendations from your health care provider to prevent or treat constipation. This information is not intended to replace advice given to you by your health care provider. Make sure you discuss any questions you have with your health care provider. Document Revised: 11/04/2017 Document Reviewed: 11/04/2017 Elsevier Patient Education  2020 [employer].  Low Back Sprain or Strain Rehab Ask your health care provider which exercises are safe for you. Do exercises exactly as told by your health care provider and adjust them as directed. It is normal to feel mild stretching, pulling, tightness, or discomfort as you do these exercises. Stop right away if you feel sudden pain or your pain gets worse. Do not begin these exercises until told by your health care provider. Stretching and range-of-motion exercises These exercises warm up your muscles and joints and improve the movement and flexibility of your back. These exercises also help to relieve pain, numbness, and tingling. Lumbar rotation  1. Lie on your back on a firm surface and bend your knees. 2. Straighten your arms out to your sides so each arm forms a 90-degree angle (right angle) with a side of your body. 3. Slowly move (rotate) both of your knees to one side of your body until you feel a stretch in your lower back (lumbar). Try not to let your shoulders lift off the floor. 4. Hold this position for __________ seconds. 5. Tense your abdominal muscles and slowly move your knees back to the starting position. 6. Repeat this exercise on the other side of your body. Repeat __________ times. Complete this exercise __________ times a day. Single knee to chest  1. Lie on your back on a firm surface with both legs straight. 2. Bend one of your knees. Use  your hands to move your knee up toward your chest until you feel a gentle stretch in your lower back and buttock. ? Hold your leg in this position by holding on to the front of your knee. ? Keep your other leg as straight as possible. 3. Hold this position for __________ seconds. 4. Slowly return to the starting position. 5. Repeat with your other leg. Repeat __________ times. Complete this exercise __________ times a day. Prone extension on elbows  1. Lie on your abdomen on a firm surface (prone position). 2. Prop yourself up on your elbows. 3. Use your arms to help lift your chest up until you feel a gentle stretch in your abdomen and your lower back. ? This will place some of your body weight on your elbows. If this is uncomfortable, try stacking pillows under your chest. ? Your hips should stay down, against the surface that you are lying on. Keep your hip and back muscles relaxed. 4. Hold this position for __________ seconds. 5. Slowly relax your upper body and return to the starting position. Repeat __________ times. Complete this exercise __________ times a  day. Strengthening exercises These exercises build strength and endurance in your back. Endurance is the ability to use your muscles for a long time, even after they get tired. Pelvic tilt This exercise strengthens the muscles that lie deep in the abdomen. 1. Lie on your back on a firm surface. Bend your knees and keep your feet flat on the floor. 2. Tense your abdominal muscles. Tip your pelvis up toward the ceiling and flatten your lower back into the floor. ? To help with this exercise, you may place a small towel under your lower back and try to push your back into the towel. 3. Hold this position for __________ seconds. 4. Let your muscles relax completely before you repeat this exercise. Repeat __________ times. Complete this exercise __________ times a day. Alternating arm and leg raises  1. Get on your hands and knees  on a firm surface. If you are on a hard floor, you may want to use padding, such as an exercise mat, to cushion your knees. 2. Line up your arms and legs. Your hands should be directly below your shoulders, and your knees should be directly below your hips. 3. Lift your left leg behind you. At the same time, raise your right arm and straighten it in front of you. ? Do not lift your leg higher than your hip. ? Do not lift your arm higher than your shoulder. ? Keep your abdominal and back muscles tight. ? Keep your hips facing the ground. ? Do not arch your back. ? Keep your balance carefully, and do not hold your breath. 4. Hold this position for __________ seconds. 5. Slowly return to the starting position. 6. Repeat with your right leg and your left arm. Repeat __________ times. Complete this exercise __________ times a day. Abdominal set with straight leg raise  1. Lie on your back on a firm surface. 2. Bend one of your knees and keep your other leg straight. 3. Tense your abdominal muscles and lift your straight leg up, 4-6 inches (10-15 cm) off the ground. 4. Keep your abdominal muscles tight and hold this position for __________ seconds. ? Do not hold your breath. ? Do not arch your back. Keep it flat against the ground. 5. Keep your abdominal muscles tense as you slowly lower your leg back to the starting position. 6. Repeat with your other leg. Repeat __________ times. Complete this exercise __________ times a day. Single leg lower with bent knees 1. Lie on your back on a firm surface. 2. Tense your abdominal muscles and lift your feet off the floor, one foot at a time, so your knees and hips are bent in 90-degree angles (right angles). ? Your knees should be over your hips and your lower legs should be parallel to the floor. 3. Keeping your abdominal muscles tense and your knee bent, slowly lower one of your legs so your toe touches the ground. 4. Lift your leg back up to return  to the starting position. ? Do not hold your breath. ? Do not let your back arch. Keep your back flat against the ground. 5. Repeat with your other leg. Repeat __________ times. Complete this exercise __________ times a day. Posture and body mechanics Good posture and healthy body mechanics can help to relieve stress in your body's tissues and joints. Body mechanics refers to the movements and positions of your body while you do your daily activities. Posture is part of body mechanics. Good posture means:  Your spine  is in its natural S-curve position (neutral).  Your shoulders are pulled back slightly.  Your head is not tipped forward. Follow these guidelines to improve your posture and body mechanics in your everyday activities. Standing   When standing, keep your spine neutral and your feet about hip width apart. Keep a slight bend in your knees. Your ears, shoulders, and hips should line up.  When you do a task in which you stand in one place for a long time, place one foot up on a stable object that is 2-4 inches (5-10 cm) high, such as a footstool. This helps keep your spine neutral. Sitting   When sitting, keep your spine neutral and keep your feet flat on the floor. Use a footrest, if necessary, and keep your thighs parallel to the floor. Avoid rounding your shoulders, and avoid tilting your head forward.  When working at a desk or a computer, keep your desk at a height where your hands are slightly lower than your elbows. Slide your chair under your desk so you are close enough to maintain good posture.  When working at a computer, place your monitor at a height where you are looking straight ahead and you do not have to tilt your head forward or downward to look at the screen. Resting  When lying down and resting, avoid positions that are most painful for you.  If you have pain with activities such as sitting, bending, stooping, or squatting, lie in a position in which your  body does not bend very much. For example, avoid curling up on your side with your arms and knees near your chest (fetal position).  If you have pain with activities such as standing for a long time or reaching with your arms, lie with your spine in a neutral position and bend your knees slightly. Try the following positions: ? Lying on your side with a pillow between your knees. ? Lying on your back with a pillow under your knees. Lifting   When lifting objects, keep your feet at least shoulder width apart and tighten your abdominal muscles.  Bend your knees and hips and keep your spine neutral. It is important to lift using the strength of your legs, not your back. Do not lock your knees straight out.  Always ask for help to lift heavy or awkward objects. This information is not intended to replace advice given to you by your health care provider. Make sure you discuss any questions you have with your health care provider. Document Revised: 01/29/2019 Document Reviewed: 10/29/2018 Elsevier Patient Education  2020 [employer].

## 2020-06-16 NOTE — Assessment & Plan Note (Signed)
respiratory panel collected. Check CBC, etc.  Check inflammatory marker since hx of Microscopic polyangiitis.  Will cover with Augmentin until results return.

## 2020-06-16 NOTE — Telephone Encounter (Signed)
Patient has been made aware and it has been taken care of. Patient to come in person.

## 2020-06-17 MED ORDER — DOXYCYCLINE HYCLATE 100 MG PO TABS: 100.0000 mg | ORAL_TABLET | Freq: Two times a day (BID) | ORAL | 0 refills | Status: DC

## 2020-06-19 ENCOUNTER — Other Ambulatory Visit: Payer: Self-pay | Admitting: Family Medicine

## 2020-06-19 DIAGNOSIS — S022XXA Fracture of nasal bones, initial encounter for closed fracture: Secondary | ICD-10-CM | POA: Diagnosis not present

## 2020-06-19 DIAGNOSIS — S0993XA Unspecified injury of face, initial encounter: Secondary | ICD-10-CM | POA: Diagnosis not present

## 2020-06-19 MED ORDER — BENZONATATE 200 MG PO CAPS: 200.0000 mg | ORAL_CAPSULE | Freq: Three times a day (TID) | ORAL | 2 refills | Status: DC | PRN

## 2020-06-21 ENCOUNTER — Encounter: Payer: Self-pay | Admitting: Family Medicine

## 2020-06-23 ENCOUNTER — Encounter: Payer: Self-pay | Admitting: *Deleted

## 2020-06-23 LAB — CBC WITH DIFFERENTIAL/PLATELET
Absolute Monocytes: 1038 cells/uL — ABNORMAL HIGH (ref 200–950)
Basophils Absolute: 26 cells/uL (ref 0–200)
Basophils Relative: 0.3 %
Eosinophils Absolute: 158 cells/uL (ref 15–500)
Eosinophils Relative: 1.8 %
HCT: 42.9 % (ref 38.5–50.0)
Hemoglobin: 14.8 g/dL (ref 13.2–17.1)
Lymphs Abs: 1109 cells/uL (ref 850–3900)
MCH: 31.8 pg (ref 27.0–33.0)
MCHC: 34.5 g/dL (ref 32.0–36.0)
MCV: 92.1 fL (ref 80.0–100.0)
MPV: 10.2 fL (ref 7.5–12.5)
Monocytes Relative: 11.8 %
Neutro Abs: 6468 cells/uL (ref 1500–7800)
Neutrophils Relative %: 73.5 %
Platelets: 319 10*3/uL (ref 140–400)
RBC: 4.66 10*6/uL (ref 4.20–5.80)
RDW: 12.3 % (ref 11.0–15.0)
Total Lymphocyte: 12.6 %
WBC: 8.8 10*3/uL (ref 3.8–10.8)

## 2020-06-23 LAB — COMPLETE METABOLIC PANEL WITH GFR
AG Ratio: 1.3 (calc) (ref 1.0–2.5)
ALT: 56 U/L — ABNORMAL HIGH (ref 9–46)
AST: 31 U/L (ref 10–40)
Albumin: 4.2 g/dL (ref 3.6–5.1)
Alkaline phosphatase (APISO): 144 U/L — ABNORMAL HIGH (ref 36–130)
BUN: 17 mg/dL (ref 7–25)
CO2: 28 mmol/L (ref 20–32)
Calcium: 8.7 mg/dL (ref 8.6–10.3)
Chloride: 99 mmol/L (ref 98–110)
Creat: 0.87 mg/dL (ref 0.60–1.35)
GFR, Est African American: 125 mL/min/{1.73_m2} (ref >=60)
GFR, Est Non African American: 108 mL/min/{1.73_m2} (ref >=60)
Globulin: 3.2 g/dL (calc) (ref 1.9–3.7)
Glucose, Bld: 94 mg/dL (ref 65–99)
Potassium: 4.1 mmol/L (ref 3.5–5.3)
Sodium: 137 mmol/L (ref 135–146)
Total Bilirubin: 0.9 mg/dL (ref 0.2–1.2)
Total Protein: 7.4 g/dL (ref 6.1–8.1)

## 2020-06-23 LAB — RESPIRATORY VIRUS PCR, PANEL
ADENOVIRUS DNA,QL PCR: NOT DETECTED
HUMAN METAPNEUMOVIRUS RNA, QL RT PCR: NOT DETECTED
INFLUENZA B RNA,PCR: NOT DETECTED
Influenza A: NOT DETECTED
PARAINFLUENZA 4 RNA: NOT DETECTED
Parainfluenza 1: NOT DETECTED
Parainfluenza 2: NOT DETECTED
RHNOVRS RNA RT PCR: NOT DETECTED
RSV RNA QL PCR: NOT DETECTED

## 2020-06-23 LAB — SEDIMENTATION RATE: Sed Rate: 46 mm/h — ABNORMAL HIGH (ref 0–15)

## 2020-06-27 DIAGNOSIS — S0993XA Unspecified injury of face, initial encounter: Secondary | ICD-10-CM | POA: Diagnosis not present

## 2020-06-27 DIAGNOSIS — S022XXA Fracture of nasal bones, initial encounter for closed fracture: Secondary | ICD-10-CM | POA: Diagnosis not present

## 2020-08-02 ENCOUNTER — Encounter: Payer: Self-pay | Admitting: Family Medicine

## 2020-08-02 DIAGNOSIS — J189 Pneumonia, unspecified organism: Secondary | ICD-10-CM

## 2020-08-02 NOTE — Telephone Encounter (Signed)
Pended x-ray with diagnosis unspecified pneumonia

## 2020-09-11 MED FILL — PRAVASTATIN SODIUM 20 MG TA: 20 | 90 days supply | Qty: 90 | Fill #2

## 2020-10-12 ENCOUNTER — Ambulatory Visit
Admission: EM | Admit: 2020-10-12 | Discharge: 2020-10-12 | Disposition: A | Discharge status: Home / Self Care | Payer: 59 | Attending: Emergency Medicine | Admitting: Emergency Medicine

## 2020-10-12 ENCOUNTER — Other Ambulatory Visit: Payer: Self-pay

## 2020-10-12 ENCOUNTER — Ambulatory Visit (INDEPENDENT_AMBULATORY_CARE_PROVIDER_SITE_OTHER): Payer: 59

## 2020-10-12 ENCOUNTER — Encounter: Payer: Self-pay | Admitting: Emergency Medicine

## 2020-10-12 DIAGNOSIS — R059 Cough, unspecified: Secondary | ICD-10-CM | POA: Diagnosis not present

## 2020-10-12 DIAGNOSIS — J984 Other disorders of lung: Secondary | ICD-10-CM

## 2020-10-12 DIAGNOSIS — R5383 Other fatigue: Secondary | ICD-10-CM

## 2020-10-12 DIAGNOSIS — R0602 Shortness of breath: Secondary | ICD-10-CM | POA: Diagnosis not present

## 2020-10-12 DIAGNOSIS — R053 Chronic cough: Secondary | ICD-10-CM

## 2020-10-12 DIAGNOSIS — J479 Bronchiectasis, uncomplicated: Secondary | ICD-10-CM | POA: Diagnosis not present

## 2020-10-12 MED ORDER — AMOXICILLIN-POT CLAVULANATE 875-125 MG PO TABS: 1.0000 | ORAL_TABLET | Freq: Two times a day (BID) | ORAL | 0 refills | Status: DC

## 2020-10-12 MED ORDER — PREDNISONE 10 MG PO TABS: 20.0000 mg | ORAL_TABLET | Freq: Every day | ORAL | 0 refills | Status: AC

## 2020-10-12 NOTE — ED Provider Notes (Signed)
[facility]   [id number] 10/12/20 Arrival Time: 1753   Chief Complaint  Patient presents with  . Cough     SUBJECTIVE: History from: patient.  Jesse Wong is a 39 y.o. male with history of pneumonia presented to the urgent care with a complaint of persistent cough with yellowish sputum for the past few days. He is complaining of chest congestion.  Denies sick exposure to COVID, flu or strep. Reported tested negative for Covid. Denies recent travel.  Has tried OTC Tessalon with mild relief. Denies alleviating or aggravating factors. Denies previous symptoms in the past.   Denies fever, chills, fatigue, sinus pain, rhinorrhea, sore throat, SOB, wheezing, chest pain, nausea, changes in bowel or bladder habits.    ROS: As per HPI.  All other pertinent ROS negative.      Past Medical History:  Diagnosis Date  . Allergy   . Arthritis   . Asthma   . Chronic prostatitis   . Myalgia   . Pulmonary infiltrates   . Sinusitis, chronic   . Tonsillar abscess   . Vasculitis [facility])    Past Surgical History:  Procedure Laterality Date  . BRONCHOSCOPY  2011   TBBX ----inflammation  . IR FLUORO GUIDE CV LINE RIGHT  01/31/2017  . IR FLUORO GUIDE CV MIDLINE PICC RIGHT  01/30/2017  . IR US GUIDE VASC ACCESS RIGHT  01/30/2017  . peritonsilar abscess    . TYMPANOPLASTY    . VIDEO BRONCHOSCOPY Bilateral 06/25/2018   Procedure: VIDEO BRONCHOSCOPY WITHOUT FLUORO;  Surgeon: Coralyn Helling, MD;  Location: [facility];  Service: Cardiopulmonary;  Laterality: Bilateral;   Allergies  Allergen Reactions  . Lipitor [Atorvastatin] Other (See Comments)    myalgias   No current facility-administered medications on file prior to encounter.   Current Outpatient Medications on File Prior to Encounter  Medication Sig Dispense Refill  . albuterol (2.5 MG/3ML) 0.083% NEBU 3 mL, albuterol (5 MG/ML) 0.5% NEBU 0.5 mL Inhale into the lungs as needed.    [name] albuterol (VENTOLIN HFA) 108 (90 Base)  MCG/ACT inhaler Inhale 2 puffs into the lungs every 6 (six) hours as needed for wheezing or shortness of breath. 1 Inhaler 5  . benzonatate (TESSALON) 200 MG capsule Take 1 capsule (200 mg total) by mouth 3 (three) times daily as needed for cough. 30 capsule 2  . cetirizine (ZYRTEC) 10 MG tablet Take 10 mg by mouth daily.    . cholecalciferol (VITAMIN D) 1000 UNITS tablet Take 1 tablet (1,000 Units total) by mouth daily. 30 tablet 6  . doxycycline (VIBRA-TABS) 100 MG tablet Take 1 tablet (100 mg total) by mouth 2 (two) times daily. 20 tablet 0  . DULERA 200-5 MCG/ACT AERO INHALE 2 PUFFS INTO THE LUNGS 2 TIMES DAILY. (Patient taking differently: Inhale 2 puffs into the lungs 2 (two) times daily as needed. ) 13 g 11  . fluticasone (FLONASE) 50 MCG/ACT nasal spray INSTILL 2 SPRAYS INTO BOTH NOSTRILS DAILY (NEED APPT FOR FURTHER REFILLS) 16 g 0  . ipratropium (ATROVENT) 0.06 % nasal spray Place 2 sprays into both nostrils every 4 (four) hours as needed for rhinitis. 10 mL 6  . Nebulizers (COMPRESSOR/NEBULIZER) MISC Use as direccted 1 each 0  . pravastatin (PRAVACHOL) 20 MG tablet Take 1 tablet (20 mg total) by mouth daily. 90 tablet 3   Social History   Socioeconomic History  . Marital status: Married    Spouse name: Not on file  . Number of children: 1  .  Years of education: Not on file  . Highest education level: Not on file  Occupational History  . Occupation: Nuclear Medicine    Employer: [employer]  Tobacco Use  . Smoking status: Never Smoker  . Smokeless tobacco: Never Used  Vaping Use  . Vaping Use: Never used  Substance and Sexual Activity  . Alcohol use: Yes    Comment: occasional  . Drug use: No  . 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  Other Topics Concern  . Not on file  Social History Narrative  . Not on file   Social Determinants of Health   Financial Resource Strain: Not on file  Food Insecurity: Not  on file  Transportation Needs: Not on file  Physical Activity: Not on file  Stress: Not on file  Social Connections: Not on file  Intimate Partner Violence: Not on file   Family History  Problem Relation Age of Onset  . Heart attack Other        grandmother  . Hyperlipidemia Father   . Thyroid disease Father        hashimoto's thyroidistis  . Heart disease Paternal Grandfather   . Colon cancer Neg Hx   . Stomach cancer Neg Hx   . Rectal cancer Neg Hx   . Esophageal cancer Neg Hx     OBJECTIVE:  Vitals:   10/12/20 1802  BP: 133/81  Pulse: 74  Resp: 15  Temp: 98.5 F (36.9 C)  SpO2: 97%     Physical Exam Vitals and nursing note reviewed.  Constitutional:      General: He is not in acute distress.    Appearance: Normal appearance. He is normal weight. He is not ill-appearing, toxic-appearing or diaphoretic.  Cardiovascular:     Rate and Rhythm: Normal rate and regular rhythm.     Pulses: Normal pulses.     Heart sounds: Normal heart sounds. No murmur heard. No friction rub. No gallop.   Pulmonary:     Effort: Pulmonary effort is normal. No respiratory distress.     Breath sounds: Normal breath sounds. No stridor. No wheezing, rhonchi or rales.  Chest:     Chest wall: No tenderness.  Neurological:     Mental Status: He is alert and oriented to person, place, and time.     LABS:  No results found for this or any previous visit (from the past 24 hour(s)).   RADIOLOGY:   DG Chest 2 View  Result Date: 10/12/2020 CLINICAL DATA:  Cough, fatigue, short of breath for several days EXAM: CHEST - 2 VIEW COMPARISON:  06/16/2020, 03/08/2020 FINDINGS: Frontal and lateral views of the chest demonstrate an unremarkable cardiac silhouette. There are chronic areas of scarring and bronchiectasis as seen on previous CT. Stable nodularity within the right lower lobe. No acute airspace disease, effusion, or pneumothorax. No acute bony abnormalities. IMPRESSION: 1. Chronic  scarring.  No acute intrathoracic process. Electronically Signed   By: Sharlet Salina M.D.   On: 10/12/2020 18:31    X-ray is negative for acute urinary disease.  I have reviewed the x-ray myself and the radiologist interpretation.  I am in agreement with the radiologist interpretation.   ASSESSMENT & PLAN:  1. Scarring of lung   2. Persistent cough     Meds ordered this encounter  Medications  . predniSONE (DELTASONE) 10 MG tablet    Sig: Take 2 tablets (20 mg total) by mouth  daily for 5 days.    Dispense:  10 tablet    Refill:  0  . amoxicillin-clavulanate (AUGMENTIN) 875-125 MG tablet    Sig: Take 1 tablet by mouth every 12 (twelve) hours.    Dispense:  14 tablet    Refill:  0    Discharge instructions   Continue to take Tessalon Perles as prescribed as directed by PCP Augmentin was prescribed-take as directed Prednisone was prescribed Follow-up with PCP for reevaluation Return or go to ED if you develop any new or worsening of your symptoms   reviewed expectations re: course of current medical issues. Questions answered. Outlined signs and symptoms indicating need for more acute intervention. Patient verbalized understanding. After Visit Summary given.         Durward Parcel, FNP 10/12/20 [phone]

## 2020-10-12 NOTE — Discharge Instructions (Addendum)
Continue to take [employer] as prescribed/as directed by PCP Augmentin was prescribed-take as directed Prednisone was prescribed Follow-up with PCP for reevaluation Return or go to ED if you develop any new or worsening of your symptoms

## 2020-10-12 NOTE — ED Triage Notes (Signed)
Patient states that he was exposure to pneumonia - neg covid test. has some lethargy. some sputum with cough, no fever. cough

## 2020-12-04 ENCOUNTER — Emergency Department (HOSPITAL_COMMUNITY)
Admission: EM | Admit: 2020-12-04 | Discharge: 2020-12-04 | Disposition: A | Discharge status: Home / Self Care | Payer: 59 | Attending: Emergency Medicine | Admitting: Emergency Medicine

## 2020-12-04 ENCOUNTER — Emergency Department (HOSPITAL_COMMUNITY): Payer: 59

## 2020-12-04 ENCOUNTER — Encounter (HOSPITAL_COMMUNITY): Payer: Self-pay | Admitting: Emergency Medicine

## 2020-12-04 ENCOUNTER — Other Ambulatory Visit (HOSPITAL_COMMUNITY): Payer: Self-pay | Admitting: Medical

## 2020-12-04 ENCOUNTER — Other Ambulatory Visit: Payer: Self-pay

## 2020-12-04 DIAGNOSIS — R110 Nausea: Secondary | ICD-10-CM | POA: Diagnosis not present

## 2020-12-04 DIAGNOSIS — J45909 Unspecified asthma, uncomplicated: Secondary | ICD-10-CM | POA: Insufficient documentation

## 2020-12-04 DIAGNOSIS — Z20822 Contact with and (suspected) exposure to covid-19: Secondary | ICD-10-CM | POA: Insufficient documentation

## 2020-12-04 DIAGNOSIS — N10 Acute tubulo-interstitial nephritis: Secondary | ICD-10-CM | POA: Diagnosis not present

## 2020-12-04 DIAGNOSIS — R109 Unspecified abdominal pain: Secondary | ICD-10-CM | POA: Insufficient documentation

## 2020-12-04 DIAGNOSIS — N12 Tubulo-interstitial nephritis, not specified as acute or chronic: Secondary | ICD-10-CM | POA: Diagnosis not present

## 2020-12-04 DIAGNOSIS — K219 Gastro-esophageal reflux disease without esophagitis: Secondary | ICD-10-CM | POA: Insufficient documentation

## 2020-12-04 DIAGNOSIS — N200 Calculus of kidney: Secondary | ICD-10-CM | POA: Diagnosis not present

## 2020-12-04 DIAGNOSIS — N1330 Unspecified hydronephrosis: Secondary | ICD-10-CM | POA: Diagnosis not present

## 2020-12-04 LAB — CBC WITH DIFFERENTIAL/PLATELET
Abs Immature Granulocytes: 0.05 10*3/uL (ref 0.00–0.07)
Basophils Absolute: 0 10*3/uL (ref 0.0–0.1)
Basophils Relative: 0 %
Eosinophils Absolute: 0.3 10*3/uL (ref 0.0–0.5)
Eosinophils Relative: 2 %
HCT: 46.3 % (ref 39.0–52.0)
Hemoglobin: 15.8 g/dL (ref 13.0–17.0)
Immature Granulocytes: 1 %
Lymphocytes Relative: 9 %
Lymphs Abs: 1 10*3/uL (ref 0.7–4.0)
MCH: 31.6 pg (ref 26.0–34.0)
MCHC: 34.1 g/dL (ref 30.0–36.0)
MCV: 92.6 fL (ref 80.0–100.0)
Monocytes Absolute: 0.5 10*3/uL (ref 0.1–1.0)
Monocytes Relative: 4 %
Neutro Abs: 9 10*3/uL — ABNORMAL HIGH (ref 1.7–7.7)
Neutrophils Relative %: 84 %
Platelets: 242 10*3/uL (ref 150–400)
RBC: 5 MIL/uL (ref 4.22–5.81)
RDW: 12.1 % (ref 11.5–15.5)
WBC: 10.8 10*3/uL — ABNORMAL HIGH (ref 4.0–10.5)
nRBC: 0 % (ref 0.0–0.2)

## 2020-12-04 LAB — URINALYSIS, ROUTINE W REFLEX MICROSCOPIC
Bilirubin Urine: NEGATIVE
Glucose, UA: NEGATIVE mg/dL
Ketones, ur: 20 mg/dL — AB
Leukocytes,Ua: NEGATIVE
Nitrite: NEGATIVE
Protein, ur: NEGATIVE mg/dL
RBC / HPF: >50 RBC/hpf — ABNORMAL HIGH (ref 0–5)
Specific Gravity, Urine: 1.018 (ref 1.005–1.030)
pH: 5 (ref 5.0–8.0)

## 2020-12-04 LAB — COMPREHENSIVE METABOLIC PANEL
ALT: 18 U/L (ref 0–44)
AST: 17 U/L (ref 15–41)
Albumin: 4.3 g/dL (ref 3.5–5.0)
Alkaline Phosphatase: 54 U/L (ref 38–126)
Anion gap: 10 (ref 5–15)
BUN: 16 mg/dL (ref 6–20)
CO2: 27 mmol/L (ref 22–32)
Calcium: 8.7 mg/dL — ABNORMAL LOW (ref 8.9–10.3)
Chloride: 102 mmol/L (ref 98–111)
Creatinine, Ser: 1.07 mg/dL (ref 0.61–1.24)
GFR, Estimated: >60 mL/min (ref >60)
Glucose, Bld: 109 mg/dL — ABNORMAL HIGH (ref 70–99)
Potassium: 3.5 mmol/L (ref 3.5–5.1)
Sodium: 139 mmol/L (ref 135–145)
Total Bilirubin: 1 mg/dL (ref 0.3–1.2)
Total Protein: 7.4 g/dL (ref 6.5–8.1)

## 2020-12-04 LAB — LIPASE, BLOOD: Lipase: 47 U/L (ref 11–51)

## 2020-12-04 LAB — SARS CORONAVIRUS 2 (TAT 6-24 HRS): SARS Coronavirus 2: NEGATIVE

## 2020-12-04 MED ORDER — SODIUM CHLORIDE 0.9 % IV BOLUS
1000.0000 mL | Freq: Once | INTRAVENOUS | Status: AC
  Administered 2020-12-04: 1000 mL via INTRAVENOUS

## 2020-12-04 MED ORDER — TAMSULOSIN HCL 0.4 MG PO CAPS: 0.4000 mg | ORAL_CAPSULE | Freq: Every day | ORAL | 0 refills | Status: DC

## 2020-12-04 MED ORDER — HYDROCODONE-ACETAMINOPHEN 5-325 MG PO TABS: 1.0000 | ORAL_TABLET | Freq: Four times a day (QID) | ORAL | 0 refills | Status: DC | PRN

## 2020-12-04 MED ORDER — FENTANYL CITRATE (PF) 100 MCG/2ML IJ SOLN
100.0000 ug | Freq: Once | INTRAMUSCULAR | Status: AC
Start: 2020-12-04 — End: 2020-12-04
  Administered 2020-12-04: 100 ug via INTRAVENOUS
  Filled 2020-12-04: qty 2

## 2020-12-04 MED ORDER — OXYCODONE-ACETAMINOPHEN 5-325 MG PO TABS
1.0000 | ORAL_TABLET | Freq: Once | ORAL | Status: AC
  Administered 2020-12-04: 1 via ORAL
  Filled 2020-12-04: qty 1

## 2020-12-04 MED FILL — HYDROCODON-APAP 5-325: 5-325 | 1 days supply | Qty: 4 | Fill #0

## 2020-12-04 MED FILL — TAMSULOSIN HCL 0.4 MG CAP: 0.4 | 7 days supply | Qty: 7 | Fill #0

## 2020-12-04 NOTE — ED Triage Notes (Signed)
Pt reports waking up with right flank pain that radiates to groin area. Has nausea but denies vomiting

## 2020-12-04 NOTE — ED Provider Notes (Signed)
[facility]-EMERGENCY DEPT Provider Note   CSN: [id number] Arrival date & time: 12/04/20  5638     History Chief Complaint  Patient presents with  . Flank Pain    Jesse Wong is a 40 y.o. male history of Wegener's disease, asthma, GERD, atherosclerosis  Patient presents for right flank pain onset this morning a few hours prior to arrival describes a waxing and waning constant pain of the right flank radiates towards his "bladder" no clear aggravating or alleviating factors pain is severe and sharp/aching in nature.  Pain is associated with nausea without vomiting.  Of note patient reports that he was moving some furniture over the past few days but does not remember any injury.  Denies fever/chills, chest pain/shortness of breath, cough, dysuria/hematuria, testicular pain/swelling, fall/injury, numbness/tingling, weakness, saddle paresthesias, bowel/bladder incontinence, urinary retention or any additional concerns HPI     Past Medical History:  Diagnosis Date  . Allergy   . Arthritis   . Asthma   . Chronic prostatitis   . Myalgia   . Pulmonary infiltrates   . Sinusitis, chronic   . Tonsillar abscess   . Vasculitis [facility])     Patient Active Problem List   Diagnosis Date Noted  . Coccygeal pain 06/16/2020  . Nose injury, initial encounter 06/16/2020  . Atherosclerosis of aorta (HCC) 04/12/2019  . Pulmonary infiltrates   . 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 Medication monitoring encounter 03/03/2018  . Pulmonary nodule seen on imaging study 01/15/2018  . Microscopic polyangiitis (HCC) 03/14/2016  . 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 Dyshidrotic eczema 04/08/2014  . 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 MYALGIA 06/28/2010  . GERD 06/27/2010  . Fever 06/08/2010  . Nonspecific (abnormal) findings on radiological and other examination of body structure 05/29/2010  . CT, CHEST, ABNORMAL 05/29/2010  . Asthma with bronchitis  04/09/2010  . 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 BRONCHOSCOPY  2011   TBBX ----inflammation  . IR FLUORO GUIDE CV LINE RIGHT  01/31/2017  . IR FLUORO GUIDE CV MIDLINE PICC RIGHT  01/30/2017  . IR US GUIDE VASC ACCESS RIGHT  01/30/2017  . peritonsilar abscess    . TYMPANOPLASTY    . VIDEO BRONCHOSCOPY Bilateral 06/25/2018   Procedure: VIDEO BRONCHOSCOPY WITHOUT FLUORO;  Surgeon: Coralyn Helling, MD;  Location: [facility] ENDOSCOPY;  Service: Cardiopulmonary;  Laterality: Bilateral;       Family History  Problem Relation Age of Onset  . Heart attack Other        grandmother  . Hyperlipidemia Father   . Thyroid disease Father        hashimoto's thyroidistis  . Heart disease Paternal Grandfather   . Colon cancer Neg Hx   . Stomach cancer Neg Hx   . Rectal cancer Neg Hx   . Esophageal cancer Neg Hx     Social History   Tobacco Use  . Smoking status: Never Smoker  . Smokeless tobacco: Never Used  Vaping Use  . Vaping Use: Never used  Substance Use Topics  . Alcohol use: Yes    Comment: occasional  . Drug use: No    Home Medications Prior to Admission medications   Medication Sig Start Date End Date Taking? Authorizing Provider  albuterol (VENTOLIN HFA) 108 (90 Base) MCG/ACT inhaler Inhale 2 puffs into the lungs every 6 (six) hours as needed for wheezing or shortness of breath. 03/11/18  Yes Agapito Games, MD  CALCIUM  PO Take 1 tablet by mouth 2 (two) times daily.   Yes [provider]  cholecalciferol (VITAMIN D) 1000 UNITS tablet Take 1 tablet (1,000 Units total) by mouth daily. Patient taking differently: Take 1,000 Units by mouth 2 (two) times daily. 05/18/14  Yes Agapito Games, MD  fluticasone (FLONASE) 50 MCG/ACT nasal spray INSTILL 2 SPRAYS INTO BOTH NOSTRILS DAILY (NEED APPT FOR FURTHER REFILLS) 07/15/19  Yes Agapito Games, MD  HYDROcodone-acetaminophen (NORCO/VICODIN) 5-325 MG tablet Take 1  tablet by mouth every 6 (six) hours as needed. 12/04/20  Yes Harlene Salts A, PA-C  Multiple Vitamin (MULTIVITAMIN WITH MINERALS) TABS tablet Take 1 tablet by mouth 2 (two) times daily.   Yes [provider]  naproxen sodium (ALEVE) 220 MG tablet Take 440 mg by mouth 2 (two) times daily as needed (headache/pain).   Yes [provider]  pravastatin (PRAVACHOL) 20 MG tablet Take 1 tablet (20 mg total) by mouth daily. 03/07/20  Yes Agapito Games, MD  tamsulosin (FLOMAX) 0.4 MG CAPS capsule Take 1 capsule (0.4 mg total) by mouth daily. 12/04/20  Yes Harlene Salts A, PA-C  amoxicillin-clavulanate (AUGMENTIN) 875-125 MG tablet Take 1 tablet by mouth every 12 (twelve) hours. Patient not taking: Reported on 12/04/2020 10/12/20   Durward Parcel, FNP  Nebulizers (COMPRESSOR/NEBULIZER) MISC Use as direccted 08/30/14   Waymon Budge, MD    Allergies    Lipitor [atorvastatin]  Review of Systems   Review of Systems Ten systems are reviewed and are negative for acute change except as noted in the HPI  Physical Exam Updated Vital Signs BP 119/72   Pulse 77   Temp 97.6 F (36.4 C) (Oral)   Resp 18   SpO2 100%   Physical Exam Constitutional:      General: He is not in acute distress.    Appearance: Normal appearance. He is well-developed. He is not ill-appearing or diaphoretic.  HENT:     Head: Normocephalic and atraumatic.  Eyes:     General: Vision grossly intact. Gaze aligned appropriately.     Pupils: Pupils are equal, round, and reactive to light.  Neck:     Trachea: Trachea and phonation normal.  Cardiovascular:     Rate and Rhythm: Normal rate and regular rhythm.     Pulses:          Dorsalis pedis pulses are 2+ on the right side and 2+ on the left side.  Pulmonary:     Effort: Pulmonary effort is normal. No respiratory distress.  Abdominal:     General: There is no distension.     Palpations: Abdomen is soft. There is no pulsatile mass.      Tenderness: There is no abdominal tenderness. There is right CVA tenderness. There is no left CVA tenderness, guarding or rebound.  Genitourinary:    Comments: Deferred by patient Musculoskeletal:        General: Normal range of motion.     Cervical back: Normal range of motion.     Comments: No midline C/T/L spinal tenderness to palpation, no paraspinal muscle tenderness, no deformity, crepitus, or step-off noted. No sign of injury to the neck or back.  Feet:     Right foot:     Protective Sensation: 3 sites tested. 3 sites sensed.     Left foot:     Protective Sensation: 3 sites tested. 3 sites sensed.  Skin:    General: Skin is warm and dry.  Neurological:  Mental Status: He is alert.     GCS: GCS eye subscore is 4. GCS verbal subscore is 5. GCS motor subscore is 6.     Comments: Speech is clear and goal oriented, follows commands Major Cranial nerves without deficit, no facial droop Moves extremities without ataxia, coordination intact  Psychiatric:        Behavior: Behavior normal.     ED Results / Procedures / Treatments   Labs (all labs ordered are listed, but only abnormal results are displayed) Labs Reviewed  URINALYSIS, ROUTINE W REFLEX MICROSCOPIC - Abnormal; Notable for the following components:      Result Value   Hgb urine dipstick LARGE (*)    Ketones, ur 20 (*)    RBC / HPF >50 (*)    Bacteria, UA RARE (*)    All other components within normal limits  CBC WITH DIFFERENTIAL/PLATELET - Abnormal; Notable for the following components:   WBC 10.8 (*)    Neutro Abs 9.0 (*)    All other components within normal limits  COMPREHENSIVE METABOLIC PANEL - Abnormal; Notable for the following components:   Glucose, Bld 109 (*)    Calcium 8.7 (*)    All other components within normal limits  URINE CULTURE  SARS CORONAVIRUS 2 (TAT 6-24 HRS)  LIPASE, BLOOD    EKG None  Radiology CT Renal Stone Study  Result Date: 12/04/2020 CLINICAL DATA:  Right flank pain and  nausea EXAM: CT ABDOMEN AND PELVIS WITHOUT CONTRAST TECHNIQUE: Multidetector CT imaging of the abdomen and pelvis was performed following the standard protocol without oral or IV contrast. COMPARISON:  None. FINDINGS: Lower chest: There is apparent tree on bud type opacities in the inferior right middle lobe and inferior lingula, areas concerning for pneumonia in these areas. Mild consolidation also noted in the periphery of the inferior lingula. Lung bases otherwise are clear. Hepatobiliary: No focal liver lesions are evident on this noncontrast enhanced study. Gallbladder wall is not appreciably thickened. There is no biliary duct dilatation. Pancreas: There is no appreciable pancreatic mass or inflammatory focus. Spleen: No splenic lesions are evident. Adrenals/Urinary Tract: Adrenals bilaterally appear normal. There is scarring in the lower pole of the left kidney. The right kidney shows equivocal edema with perinephric soft tissue stranding in the region of the right kidney. There is no focal renal mass on either side. There is no evident hydronephrosis on either side. There is a 1 mm calculus in the mid right kidney. There is no appreciable ureteral calculus on either side. The urinary bladder is midline with equivocal thickening of the urinary bladder wall. Stomach/Bowel: There is no appreciable bowel wall or mesenteric thickening. There is moderate stool in colon. There is no bowel obstruction. The terminal ileum appears unremarkable. There is no appreciable free air or portal venous air. Vascular/Lymphatic: There is no abdominal aortic aneurysm. No vascular lesions are evident on this noncontrast enhanced study. There is a retroaortic left renal vein, an anatomic variant. There are scattered mesenteric lymph nodes without adenopathy by size criteria in the abdomen or pelvis. Reproductive: Prostate and seminal vesicles are normal in size and contour. Other: No appendiceal region inflammation evident. No  abscess or ascites is appreciable in the abdomen or pelvis. Musculoskeletal: No blastic or lytic bone lesions. No abdominal wall or intramuscular lesions are evident. Suspected tiny bone islands in the proximal femur regions. IMPRESSION: 1. No ureteral calculus or hydronephrosis on either side. The right kidney is subtly edematous with perinephric soft tissue stranding  on the right. This appearance potentially could indicate recent calculus passage with resolution of hydronephrosis. This finding also could be indicative of a degree of pyelonephritis on the right. There is no evident renal abscess. Appropriate laboratory correlation with respect to potential developing pyelonephritis on the right advised. There is a tiny calculus in mid right kidney, nonobstructing. 2. Tree on bud type appearance in the inferior right middle lobe and inferior lingula with mild airspace opacity in the inferior lingula. Suspect pneumonia in these areas. Lung bases otherwise clear. 3. No bowel wall thickening or bowel obstruction. No abscess in the abdomen or pelvis. No appendiceal region inflammatory change. 4. Equivocal urinary bladder wall thickening. Early cystitis may be present Electronically Signed   By: Bretta BangWilliam  Woodruff III M.D.   On: 12/04/2020 09:36    Procedures Procedures   Medications Ordered in ED Medications  fentaNYL (SUBLIMAZE) injection 100 mcg (100 mcg Intravenous Given 12/04/20 0845)  sodium chloride 0.9 % bolus 1,000 mL (0 mLs Intravenous Stopped 12/04/20 1117)  oxyCODONE-acetaminophen (PERCOCET/ROXICET) 5-325 MG per tablet 1 tablet (1 tablet Oral Given 12/04/20 1118)    ED Course  I have reviewed the triage vital signs and the nursing notes.  Pertinent labs & imaging results that were available during my care of the patient were reviewed by me and considered in my medical decision making (see chart for details).  Clinical Course as of 12/04/20 1120  Mon Dec 04, 2020  1053 Dr. Margarita GrizzleWoodruff [BM]     Clinical Course User Index [BM] Elizabeth PalauMorelli, Cyra Spader A, PA-C   MDM Rules/Calculators/A&P                         Additional history obtained from: 1. Nursing notes from this visit. 2. Review of electronic medical records. ------------------ 40 year old male history as above presents with right flank pain onset this morning no clear aggravating or alleviating factors.  Radiates to his bladder.  He does report lifting furniture over the past few days but denies any injury.  He has no symptoms or neurologic complaint to suggest cauda equina.  Additionally no abdominal tenderness to palpation pulsatile masses and patient has equal distal pulses low suspicion for dissection/AAA at this time.  He denies any testicle pain or swelling and deferred GU examination low suspicion for torsion or epididymoorchitis.  On history and exam there is some concern for a kidney stone versus MSK pain will obtain CT renal stone study as well as basic labs and give pain medication.  Patient is agreeable for above plan. - I ordered, reviewed and interpreted labs which include: CBC shows slight leukocytosis of 10.8, no anemia. CMP shows no emergent electrolyte derangement, AKI, LFT elevations or gap. Lipase within normal limits, doubt pancreatitis. Urinalysis shows greater than 50 RBCs, large hemoglobin, 20 ketones. No WBCs leukocytes or nitrites. Rare bacteria. Consistent with likely kidney stone disease  CT renal stone:  IMPRESSION:  1. No ureteral calculus or hydronephrosis on either side. The right  kidney is subtly edematous with perinephric soft tissue stranding on  the right. This appearance potentially could indicate recent  calculus passage with resolution of hydronephrosis. This finding  also could be indicative of a degree of pyelonephritis on the right.  There is no evident renal abscess. Appropriate laboratory  correlation with respect to potential developing pyelonephritis on  the right advised. There is  a tiny calculus in mid right kidney,  nonobstructing.    2. Tree on bud type appearance  in the inferior right middle lobe and  inferior lingula with mild airspace opacity in the inferior lingula.  Suspect pneumonia in these areas. Lung bases otherwise clear.    3. No bowel wall thickening or bowel obstruction. No abscess in the  abdomen or pelvis. No appendiceal region inflammatory change.    4. Equivocal urinary bladder wall thickening. Early cystitis may be  present  - Patient reevaluated he is resting comfortably in bed no acute distress. He reports that prior to receiving fentanyl earlier his pain had suddenly greatly improved. Feel patient's given history is likely consistent with recently passed kidney stone. He has no evidence of infection but will send urine culture given CT findings concerning for possible [place]. Do not feel there is antibiotics indicated at this time for UTI, will treat patient with pain medication, Flomax and refer to urology. I discussed patient's lung finding of possible pneumonia he denies any respiratory symptoms no chest pain shortness of breath cough or fever. He reports that he did have pneumonia in September of last year but that had resolved. He reports that he follows with a pulmonologist discussed risks versus benefits of antibiotics given possible radiological findings of pneumonia patient elects to continue monitoring symptoms and follow-up with his pulmonologist and primary care provider this week. Covid test is currently in process patient aware to check his MyChart for results in the next 24 hours and discuss them at his follow-up appointments.  Patient prescribed 4 (four) pills Norco for pain related to kidney stone disease.  Discussed narcotic precautions with patient and he states understanding, he has a ride home from the ER today.  I reviewed PMD P and patient without previous narcotic prescriptions.   At this time there does not appear to be any  evidence of an acute emergency medical condition and the patient appears stable for discharge with appropriate outpatient follow up. Diagnosis was discussed with patient who verbalizes understanding of care plan and is agreeable to discharge. I have discussed return precautions with patient who verbalizes understanding. Patient encouraged to follow-up with their PCP, Urologist and pulmonologist. All questions answered.  Patient seen and evaluated by Dr. Wilkie Aye during this visit who agrees with plan to discharge with outpatient follow-up.  Note: Portions of this report may have been transcribed using voice recognition software. Every effort was made to ensure accuracy; however, inadvertent computerized transcription errors may still be present. Final Clinical Impression(s) / ED Diagnoses Final diagnoses:  Flank pain    Rx / DC Orders ED Discharge Orders         Ordered    HYDROcodone-acetaminophen (NORCO/VICODIN) 5-325 MG tablet  Every 6 hours PRN        12/04/20 1119    tamsulosin (FLOMAX) 0.4 MG CAPS capsule  Daily        12/04/20 1119           Bill Salinas, PA-C 12/04/20 1122    Horton, Clabe Seal, DO 12/08/20 1214

## 2020-12-04 NOTE — Discharge Instructions (Addendum)
At this time there does not appear to be the presence of an emergent medical condition, however there is always the potential for conditions to change. Please read and follow the below instructions.  Please return to the Emergency Department immediately for any new or worsening symptoms. Please be sure to follow up with your Primary Care Provider within one week regarding your visit today; please call their office to schedule an appointment even if you are feeling better for a follow-up visit. You have a small kidney stone in your right kidney which may cause you pain in the future.  Your CT scan also showed some inflammation around your right kidney and your bladder which along with your urine sample today could be suggestive of a recently passed kidney stone.  Please take the medication Flomax as prescribed to help facilitate stone and urine passage.  Please call the urologist Dr. Arita Miss under discharge paperwork to schedule follow-up appointment for reevaluation. Your CT scan also showed an opacity on your right middle lung and right lung lingula this could represent a pneumonia.  Please call your primary care provider and your pulmonologist today to schedule follow-up appointment for reevaluation.  If you develop symptoms of pneumonia and/or lung disease such as chest pain, cough, shortness of breath or fever please return to the emergency department immediately. You may take the medication Norco (Hydrocodone/Acetaminophen) as prescribed to help with severe pain.  This medication will make you drowsy so do not drive, drink alcohol, take other sedating medications or perform any dangerous activities such as driving after taking Norco. Norco contains Tylenol (acetaminophen) so do not take any other Tylenol-containing products with Norco. Your Covid test is currently in process.  It will result in the next 24 hours.  Please check your MyChart account for those results and discuss them with your pulmonologist  and primary care provider at your follow-up visit. Your urine culture is currently in process if it grows out bacteria which will need treatment by an antibiotic you will be called by [facility] with a prescription.  Go to the nearest Emergency Department immediately if: You have fever or chills  You get very bad pain. You get new pain in your belly (abdomen). You pass out (faint). You cannot pee. You have any new/concerning or worsening of symptoms  Please read the additional information packets attached to your discharge summary.  Do not take your medicine if  develop an itchy rash, swelling in your mouth or lips, or difficulty breathing; call 911 and seek immediate emergency medical attention if this occurs.  You may review your lab tests and imaging results in their entirety on your MyChart account.  Please discuss all results of fully with your primary care provider and other specialist at your follow-up visit.  Note: Portions of this text may have been transcribed using voice recognition software. Every effort was made to ensure accuracy; however, inadvertent computerized transcription errors may still be present.

## 2020-12-05 LAB — URINE CULTURE: Culture: NO GROWTH

## 2020-12-07 ENCOUNTER — Telehealth (INDEPENDENT_AMBULATORY_CARE_PROVIDER_SITE_OTHER): Payer: 59 | Admitting: Family Medicine

## 2020-12-07 ENCOUNTER — Encounter: Payer: Self-pay | Admitting: Family Medicine

## 2020-12-07 VITALS — HR 96 | Temp 98.5°F

## 2020-12-07 DIAGNOSIS — R109 Unspecified abdominal pain: Secondary | ICD-10-CM | POA: Diagnosis not present

## 2020-12-07 DIAGNOSIS — R3129 Other microscopic hematuria: Secondary | ICD-10-CM | POA: Diagnosis not present

## 2020-12-07 NOTE — Progress Notes (Signed)
Pt reports that he was in the ED and the pain stopped.  He stated that he wanted to speak with Dr. Linford Arnold about the ED visit

## 2020-12-07 NOTE — Progress Notes (Signed)
Virtual Visit via Video Note  I connected with Kyshawn Teal Thornton on 12/07/20 at  1:00 PM EST by a video enabled telemedicine application and verified that I am speaking with the correct person using two identifiers.   I discussed the limitations of evaluation and management by telemedicine and the availability of in person appointments. The patient expressed understanding and agreed to proceed.  Patient location: at home Provider location: in office  Subjective:    CC:   HPI: Jesse Wong is following up for recent emergency department visit.  He was seen on February 14 for right flank pain.  It was radiating around to the bladder.  No nausea or vomiting.  He had a large amount of red blood cells in the urine.  Interestingly CT scan showed some subtle edema and perinephric stranding around the right kidney which could indicate a recently passed stone or possible pyelonephritis.  There was a tiny stone in the mid right kidney that was nonobstructing.  He was also noted to have some tree-in-bud appearance in the inferior right middle lobe in the lungs with possible mild airspace opacity in the inferior lingula..  Was also a little bladder wall thickening.  Was started on Flomax and pain medication for possible stone.  Never saw the stone  Urine culture was sent and it was negative.  The test was negative.   Past medical history, Surgical history, Family history not pertinant except as noted below, Social history, Allergies, and medications have been entered into the medical record, reviewed, and corrections made.   Review of Systems: No fevers, chills, night sweats, weight loss, chest pain, or shortness of breath.   Objective:    General: Speaking clearly in complete sentences without any shortness of breath.  Alert and oriented x3.  Normal judgment. No apparent acute distress.    Impression and Recommendations:    No problem-specific Assessment & Plan notes found for this encounter.  Flank  pain with microscopic hematuria-discussed most consistent with kidney stone.  Even though they did not actually see the stone on exam.  Urine culture came back negative ruling out infectious cause.  He is actually feeling significantly better in fact while he was in the emergency room he got complete relief.  He has not had any blood in the urine or recurrent pain since then.  He never actually saw the stone pass.  I would like to recheck a urinalysis just to make sure that the blood has completely resolved.  At this point since he is symptom-free I do not see any need to repeat any imaging of the kidney.  Certainly if he gets recurrence of pain or blood in the urine or dysuria then please let us know and we can do further work-up.  I do not think he needs to see urology at this point.  We discussed the importance of just drinking plenty of fluids to prevent future stones.    Time spent in encounter 20 minutes  I discussed the assessment and treatment plan with the patient. The patient was provided an opportunity to ask questions and all were answered. The patient agreed with the plan and demonstrated an understanding of the instructions.   The patient was advised to call back or seek an in-person evaluation if the symptoms worsen or if the condition fails to improve as anticipated.   Nani Gasser, MD

## 2020-12-12 DIAGNOSIS — M317 Microscopic polyangiitis: Secondary | ICD-10-CM | POA: Diagnosis not present

## 2020-12-12 DIAGNOSIS — L4059 Other psoriatic arthropathy: Secondary | ICD-10-CM | POA: Diagnosis not present

## 2020-12-12 DIAGNOSIS — B449 Aspergillosis, unspecified: Secondary | ICD-10-CM | POA: Diagnosis not present

## 2020-12-12 DIAGNOSIS — M2550 Pain in unspecified joint: Secondary | ICD-10-CM | POA: Diagnosis not present

## 2020-12-12 DIAGNOSIS — Z7952 Long term (current) use of systemic steroids: Secondary | ICD-10-CM | POA: Diagnosis not present

## 2020-12-12 DIAGNOSIS — Z6821 Body mass index (BMI) 21.0-21.9, adult: Secondary | ICD-10-CM | POA: Diagnosis not present

## 2020-12-14 DIAGNOSIS — R3129 Other microscopic hematuria: Secondary | ICD-10-CM | POA: Diagnosis not present

## 2020-12-15 LAB — URINALYSIS
Bilirubin Urine: NEGATIVE
Glucose, UA: NEGATIVE
Ketones, ur: NEGATIVE
Leukocytes,Ua: NEGATIVE
Nitrite: NEGATIVE
Protein, ur: NEGATIVE
Specific Gravity, Urine: 1.021 (ref 1.001–1.03)
pH: <=5 (ref 5.0–8.0)

## 2020-12-15 LAB — URINALYSIS, MICROSCOPIC ONLY
Bacteria, UA: NONE SEEN /HPF
Hyaline Cast: NONE SEEN /LPF
RBC / HPF: NONE SEEN /HPF (ref 0–2)
Squamous Epithelial / HPF: NONE SEEN /HPF (ref <=5)
WBC, UA: NONE SEEN /HPF (ref 0–5)

## 2021-01-12 ENCOUNTER — Other Ambulatory Visit (HOSPITAL_BASED_OUTPATIENT_CLINIC_OR_DEPARTMENT_OTHER): Payer: Self-pay

## 2021-02-06 DIAGNOSIS — Z79899 Other long term (current) drug therapy: Secondary | ICD-10-CM | POA: Diagnosis not present

## 2021-02-06 DIAGNOSIS — L409 Psoriasis, unspecified: Secondary | ICD-10-CM | POA: Diagnosis not present

## 2021-02-06 DIAGNOSIS — I776 Arteritis, unspecified: Secondary | ICD-10-CM | POA: Diagnosis not present

## 2021-02-06 DIAGNOSIS — M810 Age-related osteoporosis without current pathological fracture: Secondary | ICD-10-CM | POA: Diagnosis not present

## 2021-02-06 DIAGNOSIS — R059 Cough, unspecified: Secondary | ICD-10-CM | POA: Diagnosis not present

## 2021-02-06 DIAGNOSIS — I10 Essential (primary) hypertension: Secondary | ICD-10-CM | POA: Diagnosis not present

## 2021-02-06 DIAGNOSIS — R9389 Abnormal findings on diagnostic imaging of other specified body structures: Secondary | ICD-10-CM | POA: Diagnosis not present

## 2021-02-14 DIAGNOSIS — B449 Aspergillosis, unspecified: Secondary | ICD-10-CM

## 2021-02-14 NOTE — Telephone Encounter (Signed)
Dr. Maple Hudson, I recently visited a multi-disciplinary clinic at [facility] for my vasculitis, and they recommended to do repeated sputum tests to try and determine what is causing my suspected atypical infection. I performed this once while at the [facility] clinic. The pulmonologist on the team committed to reaching out to you to coordinate additional testing. They also suggested moving forward with my CT scan earlier. I wanted to follow-up to see if they reached out to you. Thanks, Jesse Wong  Dr. Maple Hudson, please advise. Thanks!

## 2021-02-15 ENCOUNTER — Telehealth: Payer: Self-pay | Admitting: Internal Medicine

## 2021-02-15 NOTE — Telephone Encounter (Signed)
Thanks very much Cherina-  Please order Sputum for routine C&S, AFB/Immunofluorescence(with reflex sensitivities) and Fungal.   -CD Paco Cislo, MD   Message text    Valerie Salts, CMA routed conversation to You 33 minutes ago (9:30 AM)   Valerie Salts, CMA 33 minutes ago (9:30 AM)       Patient received the message about the CT scan and documented understanding.   He wants to know if CY would be willing to order the sputum samples that [facility] have requested. I looked at his latest [facility] records and found this:   "Assessment/Plan: Jesse Wong is a 40 y.o. 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 Interestingly, he also had significant prostatitis from the age of [age] and a suggestion of possible vocal cord involvement or subglottic stenosis with slight abnormality on PFTs and loss of falsetto voice when singing. He has never had an ENT evaluation. His disease has always responded very well to rituximab. The question is whether his disease is active or if his has an atypical MTB lung infection.   Dr. Koleen Nimrod discussed the utility of trying to examine induced sputum in order to make a diagnosis of MAC infection, and trying to avoid a bronchoscopy. He will have an induced sputum collected today. She will also speak with his local Pulmonologist about getting further samples soon.   We will check labs here today to look at his CD 19 cells, ANCA panel, ESR, CRP, CBC and CMP. If he could leave a urine sample that would also be helpful.  We will refer to ENT to look at his vocal cords, to look for subglottic stenosis and to look at his nasal and sinus passages looking for active disease. We saw crusting in his nose today."    CY, can you please advise if you are ok with this? Thanks!

## 2021-02-15 NOTE — Telephone Encounter (Signed)
Patient received the message about the CT scan and documented understanding.   He wants to know if CY would be willing to order the sputum samples that [facility] have requested. I looked at his latest [facility] records and found this:   "Assessment/Plan: Chrishun Scheer is a 40 y.o. 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 Interestingly, he also had significant prostatitis from the age of [age] and a suggestion of possible vocal cord involvement or subglottic stenosis with slight abnormality on PFTs and loss of falsetto voice when singing. He has never had an ENT evaluation. His disease has always responded very well to rituximab. The question is whether his disease is active or if his has an atypical MTB lung infection.   Dr. Koleen Nimrod discussed the utility of trying to examine induced sputum in order to make a diagnosis of MAC infection, and trying to avoid a bronchoscopy. He will have an induced sputum collected today. She will also speak with his local Pulmonologist about getting further samples soon.   We will check labs here today to look at his CD 19 cells, ANCA panel, ESR, CRP, CBC and CMP. If he could leave a urine sample that would also be helpful.  We will refer to ENT to look at his vocal cords, to look for subglottic stenosis and to look at his nasal and sinus passages looking for active disease. We saw crusting in his nose today."    CY, can you please advise if you are ok with this? Thanks!

## 2021-02-26 ENCOUNTER — Other Ambulatory Visit: Payer: Self-pay | Admitting: Adult Health

## 2021-02-26 ENCOUNTER — Telehealth: Payer: Self-pay | Admitting: Adult Health

## 2021-02-26 DIAGNOSIS — L4059 Other psoriatic arthropathy: Secondary | ICD-10-CM

## 2021-02-26 NOTE — Telephone Encounter (Signed)
I called patient to discuss Evusheld, a long acting monoclonal antibody injection administered to patients with decreased immune systems or intolerance/allergy to the COVID 19 vaccine as COVID19 prevention.    Unable to reach patient.  LMOM to return my call.  Allizon Woznick, NP  

## 2021-02-26 NOTE — Progress Notes (Signed)
I connected by phone with Jesse Wong on 02/26/2021, 11:48 AM to discuss the potential use of a new treatment, tixagevimab/cilgavimab, for pre-exposure prophylaxis for prevention of coronavirus disease 2019 (COVID-19) caused by the SARS-CoV-2 virus.  This patient is a 40 y.o. male that meets the FDA criteria for Emergency Use Authorization of tixagevimab/cilgavimab for pre-exposure prophylaxis of COVID-19 disease. Pt meets following criteria:  Age >12 yr and weight > 40kg  Not currently infected with SARS-CoV-2 and has no known recent exposure to an individual infected with SARS-CoV-2 AND o Who has moderate to severe immune compromise due to a medical condition or receipt of immunosuppressive medications or treatments and may not mount an adequate immune response to COVID-19 vaccination or  o Vaccination with any available COVID-19 vaccine, according to the approved or authorized schedule, is not recommended due to a history of severe adverse reaction (e.g., severe allergic reaction) to a COVID-19 vaccine(s) and/or COVID-19 vaccine component(s).  o Patient meets the following definition of mod-severe immune compromised status: 2. Received B-cell depleting therapies within the last 6 months and age < 43yrs  I have spoken and communicated the following to the patient or parent/caregiver regarding COVID monoclonal antibody treatment:  1. FDA has authorized the emergency use of tixagevimab/cilgavimab for the pre-exposure prophylaxis of COVID-19 in patients with moderate-severe immunocompromised status, who meet above EUA criteria.  2. The significant known and potential risks and benefits of COVID monoclonal antibody, and the extent to which such potential risks and benefits are unknown.  3. Information on available alternative treatments and the risks and benefits of those alternatives, including clinical trials.  4. The patient or parent/caregiver has the option to accept or refuse COVID  monoclonal antibody treatment.  After reviewing this information with the patient, agree to receive tixagevimab/cilgavimab.  Schedule message sent to [employer].  Informed patient that the schedulers would reach out to get him set up.    Noreene Filbert, NP, 02/26/2021, 11:48 AM

## 2021-03-02 ENCOUNTER — Ambulatory Visit (INDEPENDENT_AMBULATORY_CARE_PROVIDER_SITE_OTHER): Payer: 59

## 2021-03-02 ENCOUNTER — Other Ambulatory Visit: Payer: Self-pay

## 2021-03-02 DIAGNOSIS — Z298 Encounter for other specified prophylactic measures: Secondary | ICD-10-CM | POA: Diagnosis not present

## 2021-03-02 DIAGNOSIS — L4059 Other psoriatic arthropathy: Secondary | ICD-10-CM

## 2021-03-02 MED ORDER — CILGAVIMAB (PART OF EVUSHELD) INJECTION
300.0000 mg | Freq: Once | INTRAMUSCULAR | Status: AC
  Administered 2021-03-02: 300 mg via INTRAMUSCULAR
  Filled 2021-03-02: qty 3

## 2021-03-02 MED ORDER — METHYLPREDNISOLONE SODIUM SUCC 125 MG IJ SOLR: 125.0000 mg | Freq: Once | INTRAMUSCULAR | Status: DC | PRN

## 2021-03-02 MED ORDER — DIPHENHYDRAMINE HCL 50 MG/ML IJ SOLN: 50.0000 mg | Freq: Once | INTRAMUSCULAR | Status: AC | PRN

## 2021-03-02 MED ORDER — ALBUTEROL SULFATE HFA 108 (90 BASE) MCG/ACT IN AERS
2.0000 | INHALATION_SPRAY | Freq: Once | RESPIRATORY_TRACT | Status: DC | PRN
Start: 2021-03-02 — End: 2021-03-16

## 2021-03-02 MED ORDER — TIXAGEVIMAB (PART OF EVUSHELD) INJECTION
300.0000 mg | Freq: Once | INTRAMUSCULAR | Status: AC
  Administered 2021-03-02: 300 mg via INTRAMUSCULAR
  Filled 2021-03-02: qty 3

## 2021-03-02 MED ORDER — EPINEPHRINE 0.3 MG/0.3ML IJ SOAJ: 0.3000 mg | Freq: Once | INTRAMUSCULAR | Status: AC | PRN

## 2021-03-02 NOTE — Progress Notes (Signed)
Diagnosis: COVID prophylaxis  Provider:  Chilton Greathouse, MD  Procedure: Injection x2   Evusheld, Dose: 300 mg, Site: intramuscular, L gluteal and R gluteal Discharge: Condition: Good, Destination: Home . AVS provided to patient.   Performed by:  Essie Hart, RN

## 2021-03-07 DIAGNOSIS — Z111 Encounter for screening for respiratory tuberculosis: Secondary | ICD-10-CM | POA: Diagnosis not present

## 2021-03-07 DIAGNOSIS — R5383 Other fatigue: Secondary | ICD-10-CM | POA: Diagnosis not present

## 2021-03-07 DIAGNOSIS — M2550 Pain in unspecified joint: Secondary | ICD-10-CM | POA: Diagnosis not present

## 2021-03-07 DIAGNOSIS — Z7952 Long term (current) use of systemic steroids: Secondary | ICD-10-CM | POA: Diagnosis not present

## 2021-03-08 ENCOUNTER — Ambulatory Visit (INDEPENDENT_AMBULATORY_CARE_PROVIDER_SITE_OTHER): Payer: 59

## 2021-03-08 ENCOUNTER — Other Ambulatory Visit: Payer: Self-pay

## 2021-03-08 DIAGNOSIS — M3130 Wegener's granulomatosis without renal involvement: Secondary | ICD-10-CM | POA: Diagnosis not present

## 2021-03-08 DIAGNOSIS — R918 Other nonspecific abnormal finding of lung field: Secondary | ICD-10-CM | POA: Diagnosis not present

## 2021-03-08 DIAGNOSIS — Z8679 Personal history of other diseases of the circulatory system: Secondary | ICD-10-CM | POA: Diagnosis not present

## 2021-03-08 DIAGNOSIS — J929 Pleural plaque without asbestos: Secondary | ICD-10-CM | POA: Diagnosis not present

## 2021-03-12 ENCOUNTER — Other Ambulatory Visit: Payer: Self-pay | Admitting: Family Medicine

## 2021-03-12 ENCOUNTER — Other Ambulatory Visit (HOSPITAL_COMMUNITY): Payer: Self-pay

## 2021-03-12 DIAGNOSIS — I700 Atherosclerosis of aorta: Secondary | ICD-10-CM

## 2021-03-12 MED ORDER — PRAVASTATIN SODIUM 20 MG PO TABS
ORAL_TABLET | Freq: Every day | ORAL | 3 refills | Status: DC
  Filled 2021-03-12: qty 90, 90d supply, fill #0
  Filled 2021-06-08: qty 90, 90d supply, fill #1
  Filled 2021-08-30: qty 90, 90d supply, fill #2
  Filled 2021-12-07: qty 90, 90d supply, fill #3

## 2021-03-15 ENCOUNTER — Encounter: Payer: Self-pay | Admitting: Family Medicine

## 2021-03-15 ENCOUNTER — Other Ambulatory Visit: Payer: Self-pay

## 2021-03-15 ENCOUNTER — Ambulatory Visit (INDEPENDENT_AMBULATORY_CARE_PROVIDER_SITE_OTHER): Payer: 59 | Admitting: Family Medicine

## 2021-03-15 VITALS — BP 118/62 | HR 67 | Ht 79.0 in | Wt 189.0 lb

## 2021-03-15 DIAGNOSIS — M317 Microscopic polyangiitis: Secondary | ICD-10-CM | POA: Diagnosis not present

## 2021-03-15 DIAGNOSIS — Z0000 Encounter for general adult medical examination without abnormal findings: Secondary | ICD-10-CM | POA: Diagnosis not present

## 2021-03-15 NOTE — Progress Notes (Signed)
Established Patient Office Visit  Subjective:  Patient ID: Jesse Wong, male    DOB: 1981-01-20  Age: 40 y.o. MRN: [id number]  CC:  Chief Complaint  Patient presents with  . Annual Exam    HPI Jesse Wong presents for CPE.  He is doing okay overall he has a history of microscopic polyangiitis and unfortunately recent CT showed some worsening of his vasculitis symptoms.  He still working with his doctor at [facility] on this and here locally.  We will be starting on a new treatment regimen that will suppress his B cells.  He denies any significant shortness of breath.  He does take a probiotic regularly.  Still dealing with some coccygeal pain.  He also did receive Evushield injection a couple weeks ago  He reports that he is not currently exercising regularly, for about the last 6 months there has been a lot of fatigue with his illness more recently.  Past Medical History:  Diagnosis Date  . Allergy   . Arthritis   . Asthma   . Chronic prostatitis   . Myalgia   . Pulmonary infiltrates   . Sinusitis, chronic   . Tonsillar abscess   . Vasculitis [facility])     Past Surgical History:  Procedure Laterality Date  . BRONCHOSCOPY  2011   TBBX ----inflammation  . IR FLUORO GUIDE CV LINE RIGHT  01/31/2017  . IR FLUORO GUIDE CV MIDLINE PICC RIGHT  01/30/2017  . IR US GUIDE VASC ACCESS RIGHT  01/30/2017  . peritonsilar abscess    . TYMPANOPLASTY    . VIDEO BRONCHOSCOPY Bilateral 06/25/2018   Procedure: VIDEO BRONCHOSCOPY WITHOUT FLUORO;  Surgeon: Coralyn Helling, MD;  Location: [facility];  Service: Cardiopulmonary;  Laterality: Bilateral;    Family History  Problem Relation Age of Onset  . Heart attack Other        grandmother  . Hyperlipidemia Father   . Thyroid disease Father        hashimoto's thyroidistis  . Heart disease Paternal Grandfather   . Colon cancer Neg Hx   . Stomach cancer Neg Hx   . Rectal cancer Neg Hx   . Esophageal cancer Neg Hx     Social  History   Socioeconomic History  . Marital status: Married    Spouse name: Not on file  . Number of children: 1  . Years of education: Not on file  . Highest education level: Not on file  Occupational History  . Occupation: Nuclear Medicine    Employer:   Tobacco Use  . Smoking status: Never Smoker  . Smokeless tobacco: Never Used  Vaping Use  . Vaping Use: Never used  Substance and Sexual Activity  . Alcohol use: Yes    Comment: occasional  . Drug use: No  . 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  Other Topics Concern  . Not on file  Social History Narrative  . Not on file   Social Determinants of Health   Financial Resource Strain: Not on file  Food Insecurity: Not on file  Transportation Needs: Not on file  Physical Activity: Not on file  Stress: Not on file  Social Connections: Not on file  Intimate Partner Violence: Not on file    Outpatient Medications Prior to Visit  Medication Sig Dispense Refill  . CALCIUM PO Take 1 tablet by mouth 2 (two) times daily.    [name]  cholecalciferol (VITAMIN D) 1000 UNITS tablet Take 1 tablet (1,000 Units total) by mouth daily. (Patient taking differently: Take 1,000 Units by mouth 2 (two) times daily.) 30 tablet 6  . fluticasone (FLONASE) 50 MCG/ACT nasal spray INSTILL 2 SPRAYS INTO BOTH NOSTRILS DAILY (NEED APPT FOR FURTHER REFILLS) 16 g 0  . Multiple Vitamin (MULTIVITAMIN WITH MINERALS) TABS tablet Take 1 tablet by mouth 2 (two) times daily.    . naproxen sodium (ALEVE) 220 MG tablet Take 440 mg by mouth 2 (two) times daily as needed (headache/pain).    . Nebulizers (COMPRESSOR/NEBULIZER) MISC Use as direccted 1 each 0  . pravastatin (PRAVACHOL) 20 MG tablet TAKE 1 TABLET BY MOUTH ONCE DAILY 90 tablet 3  . albuterol (VENTOLIN HFA) 108 (90 Base) MCG/ACT inhaler Inhale 2 puffs into the lungs every 6 (six) hours as needed for wheezing or shortness of breath. 1  Inhaler 5  . tamsulosin (FLOMAX) 0.4 MG CAPS capsule TAKE 1 CAPSULE BY MOUTH ONCE A DAY 7 capsule 0   Facility-Administered Medications Prior to Visit  Medication Dose Route Frequency Provider Last Rate Last Admin  . diphenhydrAMINE (BENADRYL) injection 50 mg  50 mg Intramuscular Once PRN Causey, Larna Daughters, NP      . EPINEPHrine (EPI-PEN) injection 0.3 mg  0.3 mg Intramuscular Once PRN Causey, Larna Daughters, NP      . methylPREDNISolone sodium succinate (SOLU-MEDROL) 125 mg/2 mL injection 125 mg  125 mg Intramuscular Once PRN Causey, Larna Daughters, NP      . albuterol (VENTOLIN HFA) 108 (90 Base) MCG/ACT inhaler 2 puff  2 puff Inhalation Once PRN Causey, Larna Daughters, NP        Allergies  Allergen Reactions  . Lipitor [Atorvastatin] Other (See Comments)    myalgias    ROS Review of Systems    Objective:    Physical Exam Constitutional:      Appearance: He is well-developed.  HENT:     Head: Normocephalic and atraumatic.     Right Ear: Tympanic membrane, ear canal and external ear normal.     Left Ear: Tympanic membrane, ear canal and external ear normal.     Nose: Nose normal.     Mouth/Throat:     Mouth: Mucous membranes are moist.     Pharynx: Oropharynx is clear. No posterior oropharyngeal erythema.  Eyes:     Conjunctiva/sclera: Conjunctivae normal.     Pupils: Pupils are equal, round, and reactive to light.  Neck:     Thyroid: No thyromegaly.  Cardiovascular:     Rate and Rhythm: Normal rate.     Heart sounds: Normal heart sounds.  Pulmonary:     Effort: Pulmonary effort is normal.     Breath sounds: Normal breath sounds.  Abdominal:     General: Abdomen is flat.     Palpations: Abdomen is soft.     Tenderness: There is no abdominal tenderness.  Musculoskeletal:     Cervical back: Neck supple.  Lymphadenopathy:     Cervical: No cervical adenopathy.  Skin:    General: Skin is warm and dry.  Neurological:     Mental Status: He is alert and  oriented to person, place, and time.  Psychiatric:        Mood and Affect: Mood normal.     BP 118/62   Pulse 67   Ht 6\' 7"  (2.007 m)   Wt 189 lb (85.7 kg)   SpO2 97%   BMI 21.29 kg/m  Wt Readings from  Last 3 Encounters:  03/15/21 189 lb (85.7 kg)  06/16/20 180 lb (81.6 kg)  06/05/20 184 lb 11.2 oz (83.8 kg)     Health Maintenance Due  Topic Date Due  . HIV Screening  Never done    There are no preventive care reminders to display for this patient.  Lab Results  Component Value Date   TSH 3.75 04/12/2019   Lab Results  Component Value Date   WBC 10.8 (H) 12/04/2020   HGB 15.8 12/04/2020   HCT 46.3 12/04/2020   MCV 92.6 12/04/2020   PLT 242 12/04/2020   Lab Results  Component Value Date   NA 139 12/04/2020   K 3.5 12/04/2020   CO2 27 12/04/2020   GLUCOSE 109 (H) 12/04/2020   BUN 16 12/04/2020   CREATININE 1.07 12/04/2020   BILITOT 1.0 12/04/2020   ALKPHOS 54 12/04/2020   AST 17 12/04/2020   ALT 18 12/04/2020   PROT 7.4 12/04/2020   ALBUMIN 4.3 12/04/2020   CALCIUM 8.7 (L) 12/04/2020   ANIONGAP 10 12/04/2020   Lab Results  Component Value Date   CHOL 148 05/15/2020   Lab Results  Component Value Date   HDL 35 (L) 05/15/2020   Lab Results  Component Value Date   LDLCALC 93 05/15/2020   Lab Results  Component Value Date   TRIG 103 05/15/2020   Lab Results  Component Value Date   CHOLHDL 4.2 05/15/2020   No results found for: HGBA1C    Assessment & Plan:   Problem List Items Addressed This Visit      Cardiovascular and Mediastinum   Microscopic polyangiitis (HCC)   Relevant Medications   albuterol (VENTOLIN HFA) 108 (90 Base) MCG/ACT inhaler    Other Visit Diagnoses    Wellness examination    -  Primary     Keep up a regular exercise program and make sure you are eating a healthy diet Try to eat 4 servings of dairy a day, or if you are lactose intolerant take a calcium with vitamin D daily.  Your vaccines are up to date.    History of recurrent pneumonia.  Would like to get him updated on his pneumonia vaccine.  We will check on the Prevnar 20.  I would really like for him to get it but he is starting a new agent in about probably a week to week and a half.  We really need a good 2 weeks for optimal response to the vaccine.  I will check with our clinical pharmacist.   UpToDate:  Altered immunocompetence: Consider deferring immunization during periods of severe immunosuppression (eg, patients receiving chemo-/radiation therapy or other immunosuppressive therapy including high-dose corticosteroids); may have a reduced response to vaccination. In general, household and close contacts of persons with altered immunocompetence may receive all age appropriate vaccines (ACIP [[employer] 2021]; IDSA [Rubin 2014]). Inactivated vaccines should be administered ?2 weeks prior to planned immunosuppression when feasible;    Refilled his albuterol.   Meds ordered this encounter  Medications  . albuterol (VENTOLIN HFA) 108 (90 Base) MCG/ACT inhaler    Sig: Inhale 2 puffs into the lungs every 6 (six) hours as needed for wheezing or shortness of breath.    Dispense:  1 each    Refill:  5    Follow-up: Return in about 1 year (around 03/15/2022) for Wellness Exam.    Nani Gasser, MD

## 2021-03-15 NOTE — Patient Instructions (Signed)

## 2021-03-16 ENCOUNTER — Encounter: Payer: Self-pay | Admitting: Family Medicine

## 2021-03-16 ENCOUNTER — Other Ambulatory Visit (HOSPITAL_COMMUNITY): Payer: Self-pay

## 2021-03-16 MED ORDER — ALBUTEROL SULFATE HFA 108 (90 BASE) MCG/ACT IN AERS
2.0000 | INHALATION_SPRAY | Freq: Four times a day (QID) | RESPIRATORY_TRACT | 5 refills | Status: AC | PRN
  Filled 2021-03-16: qty 18, 25d supply, fill #0
  Filled 2021-12-07: qty 18, 25d supply, fill #1
  Filled 2022-01-03: qty 18, 25d supply, fill #2

## 2021-03-20 ENCOUNTER — Other Ambulatory Visit (HOSPITAL_COMMUNITY): Payer: Self-pay

## 2021-03-22 ENCOUNTER — Inpatient Hospital Stay: Payer: 59 | Attending: Oncology | Admitting: Oncology

## 2021-03-22 DIAGNOSIS — M317 Microscopic polyangiitis: Secondary | ICD-10-CM

## 2021-03-23 ENCOUNTER — Inpatient Hospital Stay: Payer: 59 | Attending: Oncology

## 2021-03-23 ENCOUNTER — Ambulatory Visit: Payer: 59

## 2021-03-23 ENCOUNTER — Encounter: Payer: Self-pay | Admitting: Oncology

## 2021-03-23 ENCOUNTER — Other Ambulatory Visit: Payer: Self-pay

## 2021-03-23 VITALS — BP 120/78 | HR 77 | Temp 97.8°F | Resp 18

## 2021-03-23 DIAGNOSIS — Z5112 Encounter for antineoplastic immunotherapy: Secondary | ICD-10-CM | POA: Diagnosis not present

## 2021-03-23 DIAGNOSIS — Z8349 Family history of other endocrine, nutritional and metabolic diseases: Secondary | ICD-10-CM | POA: Diagnosis not present

## 2021-03-23 DIAGNOSIS — Z79899 Other long term (current) drug therapy: Secondary | ICD-10-CM | POA: Insufficient documentation

## 2021-03-23 DIAGNOSIS — Z8249 Family history of ischemic heart disease and other diseases of the circulatory system: Secondary | ICD-10-CM | POA: Diagnosis not present

## 2021-03-23 DIAGNOSIS — M317 Microscopic polyangiitis: Secondary | ICD-10-CM | POA: Insufficient documentation

## 2021-03-23 MED ORDER — METHYLPREDNISOLONE SODIUM SUCC 125 MG IJ SOLR
100.0000 mg | Freq: Once | INTRAMUSCULAR | Status: AC
  Administered 2021-03-23: 100 mg via INTRAVENOUS
  Filled 2021-03-23: qty 2

## 2021-03-23 MED ORDER — SODIUM CHLORIDE 0.9 % IV SOLN
Freq: Once | INTRAVENOUS | Status: AC
  Filled 2021-03-23: qty 250

## 2021-03-23 MED ORDER — ACETAMINOPHEN 325 MG PO TABS
650.0000 mg | ORAL_TABLET | Freq: Once | ORAL | Status: AC
  Administered 2021-03-23: 650 mg via ORAL
  Filled 2021-03-23: qty 2

## 2021-03-23 MED ORDER — SODIUM CHLORIDE 0.9 % IV SOLN
1000.0000 mg | Freq: Once | INTRAVENOUS | Status: AC
  Administered 2021-03-23: 1000 mg via INTRAVENOUS
  Filled 2021-03-23: qty 100

## 2021-03-23 MED ORDER — FAMOTIDINE 20 MG IN NS 100 ML IVPB
20.0000 mg | Freq: Once | INTRAVENOUS | Status: AC
  Administered 2021-03-23: 20 mg via INTRAVENOUS
  Filled 2021-03-23: qty 100

## 2021-03-23 NOTE — Progress Notes (Signed)
[facility]  [phone] [phone]  ID: Jesse Wong OB: [birth year]  MR#: [id number]  CXK#:[id number]  Patient Care Team: Agapito Games, MD as PCP - Kristine Linea, MD as Consulting Physician (Rheumatology) Waymon Budge, MD as Consulting Physician (Pulmonary Disease)  CHIEF COMPLAINT: Microscopic polyangiitis  INTERVAL HISTORY: Patient is a 40 year old male with history of microscopic polyangiitis requiring Rituxan infusions approximately every 6 months.  He tolerates his treatments well without significant side effects.  He currently feels well and is asymptomatic.  He has no neurologic complaints.  He denies any recent fevers or illnesses.  He has good appetite and denies weight loss.  He has no chest pain, shortness of breath, cough, or hemoptysis.  He denies any nausea, vomiting, constipation, or diarrhea.  He has no urinary complaints.  Patient feels at his baseline offers no specific complaints today.  REVIEW OF SYSTEMS:   Review of Systems  Constitutional: Negative.  Negative for fever, malaise/fatigue and weight loss.  Respiratory: Negative.  Negative for cough, hemoptysis and shortness of breath.   Cardiovascular: Negative.  Negative for chest pain and leg swelling.  Gastrointestinal: Negative.  Negative for abdominal pain.  Genitourinary: Negative.  Negative for dysuria.  Musculoskeletal: Negative.  Negative for back pain.  Skin: Negative.  Negative for rash.  Neurological: Negative.  Negative for dizziness, focal weakness, weakness and headaches.  Psychiatric/Behavioral: Negative.  The patient is not nervous/anxious.     As per HPI. Otherwise, a complete review of systems is negative.  PAST MEDICAL HISTORY: Past Medical History:  Diagnosis Date  . Allergy   . Arthritis   . Asthma   . Chronic prostatitis   . Myalgia   . Pulmonary infiltrates   . Sinusitis, chronic   . Tonsillar abscess   .  Vasculitis (HCC)     PAST SURGICAL HISTORY: Past Surgical History:  Procedure Laterality Date  . BRONCHOSCOPY  2011   TBBX ----inflammation  . IR FLUORO GUIDE CV LINE RIGHT  01/31/2017  . IR FLUORO GUIDE CV MIDLINE PICC RIGHT  01/30/2017  . IR US GUIDE VASC ACCESS RIGHT  01/30/2017  . peritonsilar abscess    . TYMPANOPLASTY    . VIDEO BRONCHOSCOPY Bilateral 06/25/2018   Procedure: VIDEO BRONCHOSCOPY WITHOUT FLUORO;  Surgeon: Coralyn Helling, MD;  Location: [facility];  Service: Cardiopulmonary;  Laterality: Bilateral;    FAMILY HISTORY: Family History  Problem Relation Age of Onset  . Heart attack Other        grandmother  . Hyperlipidemia Father   . Thyroid disease Father        hashimoto's thyroidistis  . Heart disease Paternal Grandfather   . Colon cancer Neg Hx   . Stomach cancer Neg Hx   . Rectal cancer Neg Hx   . Esophageal cancer Neg Hx     ADVANCED DIRECTIVES (Y/N):  N  HEALTH MAINTENANCE: Social History   Tobacco Use  . Smoking status: Never Smoker  . Smokeless tobacco: Never Used  Vaping Use  . Vaping Use: Never used  Substance Use Topics  . Alcohol use: Yes    Comment: occasional  . Drug use: No     Colonoscopy:  PAP:  Bone density:  Lipid panel:  Allergies  Allergen Reactions  . Lipitor [Atorvastatin] Other (See Comments)    myalgias    Current Outpatient Medications  Medication Sig Dispense Refill  . albuterol (VENTOLIN HFA) 108 (90 Base) MCG/ACT inhaler Inhale 2 puffs into the lungs  every 6 (six) hours as needed for wheezing or shortness of breath. 18 g 5  . CALCIUM PO Take 1 tablet by mouth 2 (two) times daily.    . cholecalciferol (VITAMIN D) 1000 UNITS tablet Take 1 tablet (1,000 Units total) by mouth daily. (Patient taking differently: Take 1,000 Units by mouth 2 (two) times daily.) 30 tablet 6  . fluticasone (FLONASE) 50 MCG/ACT nasal spray INSTILL 2 SPRAYS INTO BOTH NOSTRILS DAILY (NEED APPT FOR FURTHER REFILLS) 16 g 0  . Multiple Vitamin  (MULTIVITAMIN WITH MINERALS) TABS tablet Take 1 tablet by mouth 2 (two) times daily.    . naproxen sodium (ALEVE) 220 MG tablet Take 440 mg by mouth 2 (two) times daily as needed (headache/pain).    . Nebulizers (COMPRESSOR/NEBULIZER) MISC Use as direccted 1 each 0  . pravastatin (PRAVACHOL) 20 MG tablet TAKE 1 TABLET BY MOUTH ONCE DAILY 90 tablet 3   Current Facility-Administered Medications  Medication Dose Route Frequency Provider Last Rate Last Admin  . diphenhydrAMINE (BENADRYL) injection 50 mg  50 mg Intramuscular Once PRN Causey, Larna Daughters, NP      . EPINEPHrine (EPI-PEN) injection 0.3 mg  0.3 mg Intramuscular Once PRN Causey, Larna Daughters, NP      . methylPREDNISolone sodium succinate (SOLU-MEDROL) 125 mg/2 mL injection 125 mg  125 mg Intramuscular Once PRN Causey, Larna Daughters, NP        OBJECTIVE: There were no vitals filed for this visit.   There is no height or weight on file to calculate BMI.    ECOG FS:0 - Asymptomatic  General: Well-developed, well-nourished, no acute distress. HEENT: Normocephalic. Neuro: Alert, answering all questions appropriately. Cranial nerves grossly intact. Psych: Normal affect.   LAB RESULTS:  Lab Results  Component Value Date   NA 139 12/04/2020   K 3.5 12/04/2020   CL 102 12/04/2020   CO2 27 12/04/2020   GLUCOSE 109 (H) 12/04/2020   BUN 16 12/04/2020   CREATININE 1.07 12/04/2020   CALCIUM 8.7 (L) 12/04/2020   PROT 7.4 12/04/2020   ALBUMIN 4.3 12/04/2020   AST 17 12/04/2020   ALT 18 12/04/2020   ALKPHOS 54 12/04/2020   BILITOT 1.0 12/04/2020   GFRNONAA >60 12/04/2020   GFRAA 125 06/16/2020    Lab Results  Component Value Date   WBC 10.8 (H) 12/04/2020   NEUTROABS 9.0 (H) 12/04/2020   HGB 15.8 12/04/2020   HCT 46.3 12/04/2020   MCV 92.6 12/04/2020   PLT 242 12/04/2020     STUDIES: CT Chest High Resolution  Result Date: 03/09/2021 CLINICAL DATA:  History of vasculitis, Wegner's granulomatosis EXAM: CT  CHEST WITHOUT CONTRAST TECHNIQUE: Multidetector CT imaging of the chest was performed following the standard protocol without intravenous contrast. High resolution imaging of the lungs, as well as inspiratory and expiratory imaging, was performed. COMPARISON:  03/08/2020 FINDINGS: Cardiovascular: No significant vascular findings. Normal heart size. No pericardial effusion. Mediastinum/Nodes: Numerous prominent mediastinal and bilateral hilar lymph nodes, unchanged. Thyroid gland, trachea, and esophagus demonstrate no significant findings. Lungs/Pleura: No significant air trapping on expiratory phase imaging. Mild, diffuse bilateral bronchial wall thickening. There is extensive, clustered centrilobular and tree-in-bud nodularity as well as heterogeneous airspace opacity and small consolidations, substantially increased compared to prior examination, particularly in the bilateral upper lobes (series 7, image 62, 131). Stable, benign pulmonary nodule with faint internal calcification of the anterior right lower lobe, measuring 1.2 x 0.9 cm (series 7, image 163). No pleural effusion or pneumothorax. Upper Abdomen: No acute  abnormality. Musculoskeletal: No chest wall mass or suspicious bone lesions identified. IMPRESSION: 1. There is extensive, clustered centrilobular and tree-in-bud nodularity as well as heterogeneous airspace opacity and small consolidations, substantially increased compared to prior examination, particularly in the bilateral upper lobes. 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 2. Mild, diffuse bilateral bronchial wall thickening, consistent with nonspecific infectious or inflammatory bronchitis. 3. No evidence of fibrotic interstitial lung disease or specific pulmonary manifestations of vasculitis. 4. Numerous prominent mediastinal and bilateral hilar lymph nodes, unchanged, likely reactive. Electronically Signed   By: Lauralyn Primes M.D.   On:  03/09/2021 21:01    ASSESSMENT: Microscopic polyangiitis.  PLAN:    1. Microscopic polyangiitis: Patient typically receives 1000 mg Rituxan (or bio similar Truxima) on days 1 and 15 approximately every 6 months.  He also receives 100 mg Solu-Medrol as premedication.  Patient reports he tolerates his treatment well, although does admit he has a rate based reaction. Typically nursing does not proceed to the highest infusion rate.  These all have been minor and resolved without incident or intervention.  He expressed understanding that any questions regarding his diagnosis or treatment should be directed by his primary rheumatologist.  They will also check any labs necessary.  Return to clinic tomorrow for Truxima only and then again in 2 weeks on April 06, 2021 for second infusion.  Further follow-up will be arranged by his primary rheumatologist.  I provided 45 minutes of face-to-face video visit time during this encounter which included chart review, counseling, and coordination of care as documented above.   Patient expressed understanding and was in agreement with this plan. He also understands that He can call clinic at any time with any questions, concerns, or complaints.    Jeralyn Ruths, MD   03/23/2021 5:31 AM

## 2021-03-23 NOTE — Progress Notes (Signed)
1330--patient complained of headache.  Denies any other symptoms and VSS. Rate maintained at 300mg /hr as patient has a history of intolerance to higher rate changes. Patient able to tolerate the remainder of infusion without any additional issues.

## 2021-03-23 NOTE — Patient Instructions (Signed)
[facility]  Discharge Instructions: Thank you for choosing [facility] to provide your oncology and hematology care.   If you have a lab appointment with the Cancer Center, please go directly to the Cancer Center and check in at the registration area.   Wear comfortable clothing and clothing appropriate for easy access to any Portacath or PICC line.   We strive to give you quality time with your provider. You may need to reschedule your appointment if you arrive late (15 or more minutes).  Arriving late affects you and other patients whose appointments are after yours.  Also, if you miss three or more appointments without notifying the office, you may be dismissed from the clinic at the provider's discretion.      For prescription refill requests, have your pharmacy contact our office and allow 72 hours for refills to be completed.    Today you received the following chemotherapy and/or immunotherapy agents: rituximab.    To help prevent nausea and vomiting after your treatment, we encourage you to take your nausea medication as directed.  BELOW ARE SYMPTOMS THAT SHOULD BE REPORTED IMMEDIATELY: *FEVER GREATER THAN 100.4 F (38 C) OR HIGHER *CHILLS OR SWEATING *NAUSEA AND VOMITING THAT IS NOT CONTROLLED WITH YOUR NAUSEA MEDICATION *UNUSUAL SHORTNESS OF BREATH *UNUSUAL BRUISING OR BLEEDING *URINARY PROBLEMS (pain or burning when urinating, or frequent urination) *BOWEL PROBLEMS (unusual diarrhea, constipation, pain near the anus) TENDERNESS IN MOUTH AND THROAT WITH OR WITHOUT PRESENCE OF ULCERS (sore throat, sores in mouth, or a toothache) UNUSUAL RASH, SWELLING OR PAIN  UNUSUAL VAGINAL DISCHARGE OR ITCHING   Items with * indicate a potential emergency and should be followed up as soon as possible or go to the Emergency Department if any problems should occur.  Please show the CHEMOTHERAPY ALERT CARD or IMMUNOTHERAPY ALERT CARD at check-in to the  Emergency Department and triage nurse.  Should you have questions after your visit or need to cancel or reschedule your appointment, please contact [facility]  Dept: [phone]  and follow the prompts.  Office hours are 8:00 a.m. to 4:30 p.m. Monday - Friday. Please note that voicemails left after 4:00 p.m. may not be returned until the following business day.  We are closed weekends and major holidays. You have access to a nurse at all times for urgent questions. Please call the main number to the clinic Dept: [phone] and follow the prompts.   For any non-urgent questions, you may also contact your provider using MyChart. We now offer e-Visits for anyone 18 and older to request care online for non-urgent symptoms. For details visit [email or web address].   Also download the MyChart app! Go to the app store, search "MyChart", open the app, select , and log in with your MyChart username and password.  Due to Covid, a mask is required upon entering the hospital/clinic. If you do not have a mask, one will be given to you upon arrival. For doctor visits, patients may have 1 support person aged 18 or older with them. For treatment visits, patients cannot have anyone with them due to current Covid guidelines and our immunocompromised population.   Rituximab Injection What is this medicine? RITUXIMAB (ri TUX i mab) is a monoclonal antibody. It is used to treat certain types of cancer like non-Hodgkin lymphoma and chronic lymphocytic leukemia. It is also used to treat rheumatoid arthritis, granulomatosis with polyangiitis, microscopic polyangiitis, and pemphigus vulgaris. This medicine may be   polyangiitis, microscopic polyangiitis, and pemphigus vulgaris. This medicine may be used for other purposes; ask your health care provider or pharmacist if you have questions. COMMON BRAND NAME(S): RIABNI, Rituxan, RUXIENCE What should I tell my health care provider before I take this medicine? They need to know if you have any of  these conditions:  chest pain  heart disease  infection especially a viral infection such as chickenpox, cold sores, hepatitis B, or herpes  immune system problems  irregular heartbeat or rhythm  kidney disease  low blood counts (white cells, platelets, or red cells)  lung disease  recent or upcoming vaccine  an unusual or allergic reaction to rituximab, other medicines, foods, dyes, or preservatives  pregnant or trying to get pregnant  breast-feeding How should I use this medicine? This medicine is injected into a vein. It is given by a health care provider in a hospital or clinic setting. A special MedGuide will be given to you before each treatment. Be sure to read this information carefully each time. Talk to your health care provider about the use of this medicine in children. While this drug may be prescribed for children as young as 2 years for selected conditions, precautions do apply. Overdosage: If you think you have taken too much of this medicine contact a poison control center or emergency room at once. NOTE: This medicine is only for you. Do not share this medicine with others. What if I miss a dose? Keep appointments for follow-up doses. It is important not to miss your dose. Call your health care provider if you are unable to keep an appointment. What may interact with this medicine? Do not take this medicine with any of the following medicines:  live vaccines This medicine may also interact with the following medicines:  cisplatin This list may not describe all possible interactions. Give your health care provider a list of all the medicines, herbs, non-prescription drugs, or dietary supplements you use. Also tell them if you smoke, drink alcohol, or use illegal drugs. Some items may interact with your medicine. What should I watch for while using this medicine? Your condition will be monitored carefully while you are receiving this medicine. You may need  blood work done while you are taking this medicine. This medicine can cause serious infusion reactions. To reduce the risk your health care provider may give you other medicines to take before receiving this one. Be sure to follow the directions from your health care provider. This medicine may increase your risk of getting an infection. Call your health care provider for advice if you get a fever, chills, sore throat, or other symptoms of a cold or flu. Do not treat yourself. Try to avoid being around people who are sick. Call your health care provider if you are around anyone with measles, chickenpox, or if you develop sores or blisters that do not heal properly. Avoid taking medicines that contain aspirin, acetaminophen, ibuprofen, naproxen, or ketoprofen unless instructed by your health care provider. These medicines may hide a fever. This medicine may cause serious skin reactions. They can happen weeks to months after starting the medicine. Contact your health care provider right away if you notice fevers or flu-like symptoms with a rash. The rash may be red or purple and then turn into blisters or peeling of the skin. Or, you might notice a red rash with swelling of the face, lips or lymph nodes in your neck or under your arms. In some patients, this medicine  may cause a serious brain infection that may cause death. If you have any problems seeing, thinking, speaking, walking, or standing, tell your healthcare professional right away. If you cannot reach your healthcare professional, urgently seek other source of medical care. Do not become pregnant while taking this medicine or for at least 12 months after stopping it. Women should inform their health care provider if they wish to become pregnant or think they might be pregnant. There is potential for serious harm to an unborn child. Talk to your health care provider for more information. Women should use a reliable form of birth control while taking  this medicine and for 12 months after stopping it. Do not breast-feed while taking this medicine or for at least 6 months after stopping it. What side effects may I notice from receiving this medicine? Side effects that you should report to your health care provider as soon as possible:  allergic reactions (skin rash, itching or hives; swelling of the face, lips, or tongue)  diarrhea  edema (sudden weight gain; swelling of the ankles, feet, hands or other unusual swelling; trouble breathing)  fast, irregular heartbeat  heart attack (trouble breathing; pain or tightness in the chest, neck, back or arms; unusually weak or tired)  infection (fever, chills, cough, sore throat, pain or trouble passing urine)  kidney injury (trouble passing urine or change in the amount of urine)  liver injury (dark yellow or brown urine; general ill feeling or flu-like symptoms; loss of appetite, right upper belly pain; unusually weak or tired, yellowing of the eyes or skin)  low blood pressure (dizziness; feeling faint or lightheaded, falls; unusually weak or tired)  low red blood cell counts (trouble breathing; feeling faint; lightheaded, falls; unusually weak or tired)  mouth sores  redness, blistering, peeling, or loosening of the skin, including inside the mouth  stomach pain  unusual bruising or bleeding  wheezing (trouble breathing with loud or whistling sounds)  vomiting Side effects that usually do not require medical attention (report to your health care provider if they continue or are bothersome):  headache  joint pain  muscle cramps, pain  nausea This list may not describe all possible side effects. Call your doctor for medical advice about side effects. You may report side effects to FDA at 1-800-FDA-1088. Where should I keep my medicine? This medicine is given in a hospital or clinic. It will not be stored at home. NOTE: This sheet is a summary. It may not cover all possible  information. If you have questions about this medicine, talk to your doctor, pharmacist, or health care provider.  2021 Elsevier/Gold Standard (2020-07-20 21:35:50)

## 2021-03-29 ENCOUNTER — Other Ambulatory Visit: Payer: Self-pay | Admitting: Oncology

## 2021-04-02 ENCOUNTER — Ambulatory Visit: Payer: 59 | Admitting: Family Medicine

## 2021-04-02 ENCOUNTER — Other Ambulatory Visit: Payer: Self-pay

## 2021-04-02 ENCOUNTER — Encounter: Payer: Self-pay | Admitting: Family Medicine

## 2021-04-02 VITALS — BP 108/69 | HR 65 | Ht 79.0 in | Wt 187.0 lb

## 2021-04-02 DIAGNOSIS — R1013 Epigastric pain: Secondary | ICD-10-CM | POA: Diagnosis not present

## 2021-04-02 NOTE — Progress Notes (Signed)
Acute Office Visit  Subjective:    Patient ID: Jesse Wong, male    DOB: [birth year], 40 y.o.   MRN: [id number]  Chief Complaint  Patient presents with   Abdominal Pain    HPI Patient is in today for epigastric pain with aching and nausea for 5 weeks. Iike a kidney stones.  Pain was a 3/10 earlier today and then went away.  He says it is occurring on an almost daily basis.  When initially started he did start taking some omeprazole and felt like maybe it was improving so after to 14 days he discontinued the omeprazole and the symptoms have been coming back.   Restarted the med yesterday.  Has had some nausea with it but no vomiting.  He seems like it happens more often after he eats but not always he says sometimes if he eats very light or does not eat at all he starts to get a little bit of discomfort as well.  No blood in the stool.  No change in bowel movements.  He still has his gallbladder.  No excess belching.  The pain can be severe at times.  If it is more severe it usually just a couple of minutes and then after that it is more of a dull persistent ache.  Recent dietary changes.  He avoids caffeine.  Past Medical History:  Diagnosis Date   Allergy    Arthritis    Asthma    Chronic prostatitis    Myalgia    Pulmonary infiltrates    Sinusitis, chronic    Tonsillar abscess    Vasculitis (HCC)     Past Surgical History:  Procedure Laterality Date   BRONCHOSCOPY  2011   TBBX ----inflammation   IR FLUORO GUIDE CV LINE RIGHT  01/31/2017   IR FLUORO GUIDE CV MIDLINE PICC RIGHT  01/30/2017   IR US GUIDE VASC ACCESS RIGHT  01/30/2017   peritonsilar abscess     TYMPANOPLASTY     VIDEO BRONCHOSCOPY Bilateral 06/25/2018   Procedure: VIDEO BRONCHOSCOPY WITHOUT FLUORO;  Surgeon: Coralyn Helling, MD;  Location: [facility];  Service: Cardiopulmonary;  Laterality: Bilateral;    Family History  Problem Relation Age of Onset   Heart attack Other        grandmother   Hyperlipidemia  Father    Thyroid disease Father        hashimoto's thyroidistis   Heart disease Paternal Grandfather    Colon cancer Neg Hx    Stomach cancer Neg Hx    Rectal cancer Neg Hx    Esophageal cancer Neg Hx     Social History   Socioeconomic History   Marital status: Married    Spouse name: Not on file   Number of children: 1   Years of education: Not on file   Highest education level: Not on file  Occupational History   Occupation: Nuclear Medicine    Employer: [employer]  Tobacco Use   Smoking status: Never   Smokeless tobacco: Never  Vaping Use   Vaping Use: Never used  Substance and Sexual Activity   Alcohol use: Yes    Comment: occasional   Drug use: No   Sexual activity: Not on file    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  Other Topics Concern   Not on file  Social History Narrative   Not on file   Social Determinants of Health   Financial Resource Strain: Not on file  Food Insecurity: Not on file  Transportation Needs: Not on file  Physical Activity: Not on file  Stress: Not on file  Social Connections: Not on file  Intimate Partner Violence: Not on file    Outpatient Medications Prior to Visit  Medication Sig Dispense Refill   albuterol (VENTOLIN HFA) 108 (90 Base) MCG/ACT inhaler Inhale 2 puffs into the lungs every 6 (six) hours as needed for wheezing or shortness of breath. 18 g 5   CALCIUM PO Take 1 tablet by mouth 2 (two) times daily.     cholecalciferol (VITAMIN D) 1000 UNITS tablet Take 1 tablet (1,000 Units total) by mouth daily. (Patient taking differently: Take 1,000 Units by mouth 2 (two) times daily.) 30 tablet 6   fluticasone (FLONASE) 50 MCG/ACT nasal spray INSTILL 2 SPRAYS INTO BOTH NOSTRILS DAILY (NEED APPT FOR FURTHER REFILLS) 16 g 0   Multiple Vitamin (MULTIVITAMIN WITH MINERALS) TABS tablet Take 1 tablet by mouth 2 (two) times daily.     naproxen sodium (ALEVE) 220 MG tablet Take 440 mg by mouth 2 (two)  times daily as needed (headache/pain).     Nebulizers (COMPRESSOR/NEBULIZER) MISC Use as direccted 1 each 0   OMEPRAZOLE PO Take by mouth.     pravastatin (PRAVACHOL) 20 MG tablet TAKE 1 TABLET BY MOUTH ONCE DAILY 90 tablet 3   Facility-Administered Medications Prior to Visit  Medication Dose Route Frequency Provider Last Rate Last Admin   diphenhydrAMINE (BENADRYL) injection 50 mg  50 mg Intramuscular Once PRN Causey, Larna Daughters, NP       EPINEPHrine (EPI-PEN) injection 0.3 mg  0.3 mg Intramuscular Once PRN Loa Socks, NP       methylPREDNISolone sodium succinate (SOLU-MEDROL) 125 mg/2 mL injection 125 mg  125 mg Intramuscular Once PRN Causey, Larna Daughters, NP        Allergies  Allergen Reactions   Lipitor [Atorvastatin] Other (See Comments)    myalgias    Review of Systems     Objective:    Physical Exam Constitutional:      Appearance: He is well-developed.  HENT:     Head: Normocephalic and atraumatic.  Cardiovascular:     Rate and Rhythm: Normal rate and regular rhythm.     Heart sounds: Normal heart sounds.  Pulmonary:     Effort: Pulmonary effort is normal.  Abdominal:     Tenderness: There is no abdominal tenderness. There is no guarding.  Skin:    General: Skin is warm and dry.  Neurological:     Mental Status: He is alert and oriented to person, place, and time.  Psychiatric:        Behavior: Behavior normal.    BP 108/69   Pulse 65   Ht 6\' 7"  (2.007 m)   Wt 187 lb (84.8 kg)   SpO2 97%   BMI 21.07 kg/m  Wt Readings from Last 3 Encounters:  04/02/21 187 lb (84.8 kg)  03/15/21 189 lb (85.7 kg)  06/16/20 180 lb (81.6 kg)    Health Maintenance Due  Topic Date Due   HIV Screening  Never done    There are no preventive care reminders to display for this patient.   Lab Results  Component Value Date   TSH 3.75 04/12/2019   Lab Results  Component Value Date   WBC 10.8 (H) 12/04/2020   HGB 15.8 12/04/2020   HCT 46.3  12/04/2020   MCV 92.6 12/04/2020   PLT 242 12/04/2020   Lab Results  Component Value Date   NA 139 12/04/2020   K 3.5 12/04/2020   CO2 27 12/04/2020   GLUCOSE 109 (H) 12/04/2020   BUN 16 12/04/2020   CREATININE 1.07 12/04/2020   BILITOT 1.0 12/04/2020   ALKPHOS 54 12/04/2020   AST 17 12/04/2020   ALT 18 12/04/2020   PROT 7.4 12/04/2020   ALBUMIN 4.3 12/04/2020   CALCIUM 8.7 (L) 12/04/2020   ANIONGAP 10 12/04/2020   Lab Results  Component Value Date   CHOL 148 05/15/2020   Lab Results  Component Value Date   HDL 35 (L) 05/15/2020   Lab Results  Component Value Date   LDLCALC 93 05/15/2020   Lab Results  Component Value Date   TRIG 103 05/15/2020   Lab Results  Component Value Date   CHOLHDL 4.2 05/15/2020   No results found for: HGBA1C     Assessment & Plan:   Problem List Items Addressed This Visit   None Visit Diagnoses     Epigastric pain    -  Primary        Epigastric pain-sounds most consistent with gastritis versus GERD versus gastric ulcer.  Recommend treating with a PPI increase omeprazole to 40 mg daily for 1 week and then okay to decrease down to 20 mg daily for complete 6-week treatment.  If he is not improving over the next week then recommend ultrasound to take a look at his gallbladder he does have a history of a gallbladder polyp.  Ultrasound of abdomen back in 2014 showed a nonmobile echogenicity consistent with possible polyp.  And possible adherent stone is felt to be less likely.  Also consider GI referral if he continues to have breakthrough symptoms even on a proton pump inhibitor.  Okay to use things like Tums initially for breakthrough symptoms for the first week.  If any new or changing symptoms then please let us know immediately.  Continue to avoid greasy, spicy, acidic foods.  Avoid caffeine and carbonated beverages.  No orders of the defined types were placed in this encounter.    Nani Gasser, MD

## 2021-04-03 NOTE — Progress Notes (Signed)
Diagnosis: COVID prophylaxis  Provider:  Chilton Greathouse, MD  Procedure: Injection x2   Evusheld, Dose: 300 mg, Site: intramuscular, L gluteal and R gluteal Discharge: Condition: Good, Destination: Home . AVS provided to patient.   Performed by:  Essie Hart RN

## 2021-04-06 ENCOUNTER — Other Ambulatory Visit: Payer: Self-pay

## 2021-04-06 ENCOUNTER — Inpatient Hospital Stay: Payer: 59

## 2021-04-06 VITALS — BP 109/66 | HR 64 | Temp 98.1°F | Resp 18

## 2021-04-06 DIAGNOSIS — Z79899 Other long term (current) drug therapy: Secondary | ICD-10-CM | POA: Diagnosis not present

## 2021-04-06 DIAGNOSIS — Z8349 Family history of other endocrine, nutritional and metabolic diseases: Secondary | ICD-10-CM | POA: Diagnosis not present

## 2021-04-06 DIAGNOSIS — Z5112 Encounter for antineoplastic immunotherapy: Secondary | ICD-10-CM | POA: Diagnosis not present

## 2021-04-06 DIAGNOSIS — M317 Microscopic polyangiitis: Secondary | ICD-10-CM | POA: Diagnosis not present

## 2021-04-06 DIAGNOSIS — Z8249 Family history of ischemic heart disease and other diseases of the circulatory system: Secondary | ICD-10-CM | POA: Diagnosis not present

## 2021-04-06 MED ORDER — SODIUM CHLORIDE 0.9 % IV SOLN
1000.0000 mg | Freq: Once | INTRAVENOUS | Status: AC
  Administered 2021-04-06: 1000 mg via INTRAVENOUS
  Filled 2021-04-06: qty 100

## 2021-04-06 MED ORDER — FAMOTIDINE 20 MG IN NS 100 ML IVPB: 20.0000 mg | Freq: Once | INTRAVENOUS | Status: DC

## 2021-04-06 MED ORDER — ACETAMINOPHEN 325 MG PO TABS
650.0000 mg | ORAL_TABLET | Freq: Once | ORAL | Status: AC
  Administered 2021-04-06: 650 mg via ORAL
  Filled 2021-04-06: qty 2

## 2021-04-06 MED ORDER — SODIUM CHLORIDE 0.9 % IV SOLN
Freq: Once | INTRAVENOUS | Status: AC
  Filled 2021-04-06: qty 250

## 2021-04-06 MED ORDER — METHYLPREDNISOLONE SODIUM SUCC 125 MG IJ SOLR
100.0000 mg | Freq: Once | INTRAMUSCULAR | Status: AC
Start: 2021-04-06 — End: 2021-04-06
  Administered 2021-04-06: 100 mg via INTRAVENOUS
  Filled 2021-04-06: qty 2

## 2021-04-06 NOTE — Patient Instructions (Signed)
[facility]  Discharge Instructions: Thank you for choosing [facility] to provide your oncology and hematology care.   If you have a lab appointment with the Cancer Center, please go directly to the Cancer Center and check in at the registration area.   Wear comfortable clothing and clothing appropriate for easy access to any Portacath or PICC line.   We strive to give you quality time with your provider. You may need to reschedule your appointment if you arrive late (15 or more minutes).  Arriving late affects you and other patients whose appointments are after yours.  Also, if you miss three or more appointments without notifying the office, you may be dismissed from the clinic at the provider's discretion.      For prescription refill requests, have your pharmacy contact our office and allow 72 hours for refills to be completed.    Today you received the following chemotherapy and/or immunotherapy agents: rituximab.    To help prevent nausea and vomiting after your treatment, we encourage you to take your nausea medication as directed.  BELOW ARE SYMPTOMS THAT SHOULD BE REPORTED IMMEDIATELY: *FEVER GREATER THAN 100.4 F (38 C) OR HIGHER *CHILLS OR SWEATING *NAUSEA AND VOMITING THAT IS NOT CONTROLLED WITH YOUR NAUSEA MEDICATION *UNUSUAL SHORTNESS OF BREATH *UNUSUAL BRUISING OR BLEEDING *URINARY PROBLEMS (pain or burning when urinating, or frequent urination) *BOWEL PROBLEMS (unusual diarrhea, constipation, pain near the anus) TENDERNESS IN MOUTH AND THROAT WITH OR WITHOUT PRESENCE OF ULCERS (sore throat, sores in mouth, or a toothache) UNUSUAL RASH, SWELLING OR PAIN  UNUSUAL VAGINAL DISCHARGE OR ITCHING   Items with * indicate a potential emergency and should be followed up as soon as possible or go to the Emergency Department if any problems should occur.  Please show the CHEMOTHERAPY ALERT CARD or IMMUNOTHERAPY ALERT CARD at check-in to the  Emergency Department and triage nurse.  Should you have questions after your visit or need to cancel or reschedule your appointment, please contact [facility]  Dept: [phone]  and follow the prompts.  Office hours are 8:00 a.m. to 4:30 p.m. Monday - Friday. Please note that voicemails left after 4:00 p.m. may not be returned until the following business day.  We are closed weekends and major holidays. You have access to a nurse at all times for urgent questions. Please call the main number to the clinic Dept: [phone] and follow the prompts.   For any non-urgent questions, you may also contact your provider using MyChart. We now offer e-Visits for anyone 3 and older to request care online for non-urgent symptoms. For details visit [email or web address].   Also download the MyChart app! Go to the app store, search "MyChart", open the app, select [place], and log in with your MyChart username and password.  Due to Covid, a mask is required upon entering the hospital/clinic. If you do not have a mask, one will be given to you upon arrival. For doctor visits, patients may have 1 support person aged 35 or older with them. For treatment visits, patients cannot have anyone with them due to current Covid guidelines and our immunocompromised population.   Rituximab Injection What is this medicine? RITUXIMAB (ri TUX i mab) is a monoclonal antibody. It is used to treat certain types of cancer like non-Hodgkin lymphoma and chronic lymphocytic leukemia. It is also used to treat rheumatoid arthritis, granulomatosis with polyangiitis, microscopic polyangiitis, and pemphigus vulgaris. This medicine may be  used for other purposes; ask your health care provider or pharmacist if you have questions. COMMON BRAND NAME(S): RIABNI, Rituxan, RUXIENCE What should I tell my health care provider before I take this medicine? They need to know if you have any of these  conditions: chest pain heart disease infection especially a viral infection such as chickenpox, cold sores, hepatitis B, or herpes immune system problems irregular heartbeat or rhythm kidney disease low blood counts (white cells, platelets, or red cells) lung disease recent or upcoming vaccine an unusual or allergic reaction to rituximab, other medicines, foods, dyes, or preservatives pregnant or trying to get pregnant breast-feeding How should I use this medicine? This medicine is injected into a vein. It is given by a health care provider in a hospital or clinic setting. A special MedGuide will be given to you before each treatment. Be sure to read this information carefully each time. Talk to your health care provider about the use of this medicine in children. While this drug may be prescribed for children as young as 2 years for selected conditions, precautions do apply. Overdosage: If you think you have taken too much of this medicine contact a poison control center or emergency room at once. NOTE: This medicine is only for you. Do not share this medicine with others. What if I miss a dose? Keep appointments for follow-up doses. It is important not to miss your dose. Call your health care provider if you are unable to keep an appointment. What may interact with this medicine? Do not take this medicine with any of the following medicines: live vaccines This medicine may also interact with the following medicines: cisplatin This list may not describe all possible interactions. Give your health care provider a list of all the medicines, herbs, non-prescription drugs, or dietary supplements you use. Also tell them if you smoke, drink alcohol, or use illegal drugs. Some items may interact with your medicine. What should I watch for while using this medicine? Your condition will be monitored carefully while you are receiving this medicine. You may need blood work done while you are taking  this medicine. This medicine can cause serious infusion reactions. To reduce the risk your health care provider may give you other medicines to take before receiving this one. Be sure to follow the directions from your health care provider. This medicine may increase your risk of getting an infection. Call your health care provider for advice if you get a fever, chills, sore throat, or other symptoms of a cold or flu. Do not treat yourself. Try to avoid being around people who are sick. Call your health care provider if you are around anyone with measles, chickenpox, or if you develop sores or blisters that do not heal properly. Avoid taking medicines that contain aspirin, acetaminophen, ibuprofen, naproxen, or ketoprofen unless instructed by your health care provider. These medicines may hide a fever. This medicine may cause serious skin reactions. They can happen weeks to months after starting the medicine. Contact your health care provider right away if you notice fevers or flu-like symptoms with a rash. The rash may be red or purple and then turn into blisters or peeling of the skin. Or, you might notice a red rash with swelling of the face, lips or lymph nodes in your neck or under your arms. In some patients, this medicine may cause a serious brain infection that may cause death. If you have any problems seeing, thinking, speaking, walking, or standing, tell your healthcare  professional right away. If you cannot reach your healthcare professional, urgently seek other source of medical care. Do not become pregnant while taking this medicine or for at least 12 months after stopping it. Women should inform their health care provider if they wish to become pregnant or think they might be pregnant. There is potential for serious harm to an unborn child. Talk to your health care provider for more information. Women should use a reliable form of birth control while taking this medicine and for 12 months after  stopping it. Do not breast-feed while taking this medicine or for at least 6 months after stopping it. What side effects may I notice from receiving this medicine? Side effects that you should report to your health care provider as soon as possible: allergic reactions (skin rash, itching or hives; swelling of the face, lips, or tongue) diarrhea edema (sudden weight gain; swelling of the ankles, feet, hands or other unusual swelling; trouble breathing) fast, irregular heartbeat heart attack (trouble breathing; pain or tightness in the chest, neck, back or arms; unusually weak or tired) infection (fever, chills, cough, sore throat, pain or trouble passing urine) kidney injury (trouble passing urine or change in the amount of urine) liver injury (dark yellow or brown urine; general ill feeling or flu-like symptoms; loss of appetite, right upper belly pain; unusually weak or tired, yellowing of the eyes or skin) low blood pressure (dizziness; feeling faint or lightheaded, falls; unusually weak or tired) low red blood cell counts (trouble breathing; feeling faint; lightheaded, falls; unusually weak or tired) mouth sores redness, blistering, peeling, or loosening of the skin, including inside the mouth stomach pain unusual bruising or bleeding wheezing (trouble breathing with loud or whistling sounds) vomiting Side effects that usually do not require medical attention (report to your health care provider if they continue or are bothersome): headache joint pain muscle cramps, pain nausea This list may not describe all possible side effects. Call your doctor for medical advice about side effects. You may report side effects to FDA at 1-800-FDA-1088. Where should I keep my medicine? This medicine is given in a hospital or clinic. It will not be stored at home. NOTE: This sheet is a summary. It may not cover all possible information. If you have questions about this medicine, talk to your doctor,  pharmacist, or health care provider.  2021 Elsevier/Gold Standard (2020-07-20 21:35:50)

## 2021-04-06 NOTE — Progress Notes (Signed)
Pepcid not given as premed as patient declined it. Maximum rate of infusion kept at 300mg /hr as patient had tolerated this rate during his previous infusion.  Patient tolerated treatment well without any issues.

## 2021-05-08 ENCOUNTER — Other Ambulatory Visit (HOSPITAL_COMMUNITY): Payer: Self-pay

## 2021-05-08 DIAGNOSIS — I776 Arteritis, unspecified: Secondary | ICD-10-CM | POA: Diagnosis not present

## 2021-05-08 DIAGNOSIS — T17500A Unspecified foreign body in bronchus causing asphyxiation, initial encounter: Secondary | ICD-10-CM | POA: Diagnosis not present

## 2021-05-08 MED ORDER — SODIUM CHLORIDE 3 % IN NEBU
INHALATION_SOLUTION | RESPIRATORY_TRACT | 11 refills | Status: DC
  Filled 2021-05-08: qty 240, 30d supply, fill #0

## 2021-05-10 ENCOUNTER — Other Ambulatory Visit (HOSPITAL_COMMUNITY): Payer: Self-pay

## 2021-05-11 ENCOUNTER — Other Ambulatory Visit (HOSPITAL_COMMUNITY): Payer: Self-pay

## 2021-05-11 MED ORDER — SODIUM CHLORIDE 3 % IN NEBU: INHALATION_SOLUTION | RESPIRATORY_TRACT | 11 refills | Status: DC

## 2021-05-15 ENCOUNTER — Other Ambulatory Visit (HOSPITAL_COMMUNITY): Payer: Self-pay

## 2021-05-16 ENCOUNTER — Other Ambulatory Visit (HOSPITAL_COMMUNITY): Payer: Self-pay

## 2021-05-17 ENCOUNTER — Other Ambulatory Visit (HOSPITAL_COMMUNITY): Payer: Self-pay

## 2021-05-17 DIAGNOSIS — M810 Age-related osteoporosis without current pathological fracture: Secondary | ICD-10-CM | POA: Diagnosis not present

## 2021-05-17 DIAGNOSIS — J310 Chronic rhinitis: Secondary | ICD-10-CM | POA: Diagnosis not present

## 2021-05-17 DIAGNOSIS — I776 Arteritis, unspecified: Secondary | ICD-10-CM | POA: Diagnosis not present

## 2021-05-17 DIAGNOSIS — R0602 Shortness of breath: Secondary | ICD-10-CM | POA: Diagnosis not present

## 2021-05-17 DIAGNOSIS — R040 Epistaxis: Secondary | ICD-10-CM | POA: Diagnosis not present

## 2021-05-17 DIAGNOSIS — H903 Sensorineural hearing loss, bilateral: Secondary | ICD-10-CM | POA: Diagnosis not present

## 2021-05-17 DIAGNOSIS — R0981 Nasal congestion: Secondary | ICD-10-CM | POA: Diagnosis not present

## 2021-05-17 DIAGNOSIS — Z7952 Long term (current) use of systemic steroids: Secondary | ICD-10-CM | POA: Insufficient documentation

## 2021-05-17 DIAGNOSIS — Z79899 Other long term (current) drug therapy: Secondary | ICD-10-CM | POA: Diagnosis not present

## 2021-05-17 MED ORDER — OMEPRAZOLE 20 MG PO CPDR
20.0000 mg | DELAYED_RELEASE_CAPSULE | Freq: Every day | ORAL | 0 refills | Status: AC
  Filled 2021-05-17: qty 90, 90d supply, fill #0

## 2021-05-17 MED ORDER — MOMETASONE FURO-FORMOTEROL FUM 200-5 MCG/ACT IN AERO
2.0000 | INHALATION_SPRAY | Freq: Two times a day (BID) | RESPIRATORY_TRACT | 0 refills | Status: DC
  Filled 2021-05-17: qty 13, 30d supply, fill #0
  Filled 2021-05-17: qty 1, fill #0

## 2021-05-17 MED ORDER — MUPIROCIN 2 % EX OINT
TOPICAL_OINTMENT | CUTANEOUS | 0 refills | Status: DC
  Filled 2021-05-17: qty 22, 15d supply, fill #0

## 2021-05-18 ENCOUNTER — Other Ambulatory Visit (HOSPITAL_COMMUNITY): Payer: Self-pay

## 2021-05-19 ENCOUNTER — Other Ambulatory Visit (HOSPITAL_COMMUNITY): Payer: Self-pay

## 2021-05-21 ENCOUNTER — Other Ambulatory Visit (HOSPITAL_COMMUNITY): Payer: Self-pay

## 2021-05-23 ENCOUNTER — Other Ambulatory Visit (HOSPITAL_COMMUNITY): Payer: Self-pay

## 2021-05-25 ENCOUNTER — Other Ambulatory Visit (HOSPITAL_COMMUNITY): Payer: Self-pay

## 2021-05-28 ENCOUNTER — Other Ambulatory Visit (HOSPITAL_COMMUNITY): Payer: Self-pay

## 2021-05-30 ENCOUNTER — Other Ambulatory Visit (HOSPITAL_COMMUNITY): Payer: Self-pay

## 2021-05-30 MED ORDER — ALBUTEROL SULFATE (2.5 MG/3ML) 0.083% IN NEBU
INHALATION_SOLUTION | RESPIRATORY_TRACT | 11 refills | Status: AC
  Filled 2021-05-30: qty 180, 15d supply, fill #0
  Filled 2021-07-31: qty 180, 15d supply, fill #1

## 2021-05-30 MED ORDER — DULERA 200-5 MCG/ACT IN AERO
INHALATION_SPRAY | RESPIRATORY_TRACT | 11 refills | Status: DC
  Filled 2021-05-30 – 2021-06-20 (×2): qty 13, 30d supply, fill #0

## 2021-05-31 ENCOUNTER — Other Ambulatory Visit (HOSPITAL_COMMUNITY): Payer: Self-pay

## 2021-06-01 ENCOUNTER — Other Ambulatory Visit (HOSPITAL_COMMUNITY): Payer: Self-pay

## 2021-06-05 ENCOUNTER — Other Ambulatory Visit (HOSPITAL_COMMUNITY): Payer: Self-pay

## 2021-06-08 ENCOUNTER — Other Ambulatory Visit (HOSPITAL_COMMUNITY): Payer: Self-pay

## 2021-06-09 NOTE — Progress Notes (Signed)
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 Presented 2011 with nodular infiltrates and progressive weakness/neuropathy. Bronchoscoped 2011- Neg.  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 FVC 4.91/73%, FEV1 4.19/83%, FEV1/FVC 0.85, FEF 25-75% 0.85, TLC 78%, DLCO 113% PFT 06/08/2014-normal lung volumes, minimal slowing and small airway flows with response to bronchodilator, normal diffusion CXR 08/30/2014-Emphysematous and bronchitic changes consistent with COPD. Bronchoscopy 2019-Dr. Sood-BAL-normal flora.  Aspergillus Ag BAL 0.10 WNL, fungal cultures negative, AFB neg. cytology benign. PFT [facility], 03/10/2019 without interpretation- numerical results scanned to media. 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 Presented 2011 with nodular infiltrates and progressive weakness/neuropathy. Parallel f/u with Dr Patel/ Pulmonary _0 - agrees with conservative plan for annual CT, holding bronch searching for NTM pending progression. Advised staying off macrolides in case needed for NTM in future. Dulera 200, Albuterol hfa, neb albuterol, ipratropium 0.06% nasal Had 2 Phizer Covax Slow increase nasal congestion as he continues flonase. Not aware of polyps. No productive cough. No sweats/ fever. Following up with  Dr [name] Rheumatology for hip/ low back pain, anticipating single dose of high dose solumedrol which we discussed for potential to aggravate low-grade pulmonary infection if present.  CT chest High Res- 03/08/20- 1. No evidence of fibrotic interstitial lung disease. 2. 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 3. Minimal air trapping is indicative of small airways disease.  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 Presented 2011 with nodular infiltrates and progressive weakness/neuropathy. Parallel f/u with Dr Patel/ Pulmonary _1 - agrees with conservative plan for annual CT, holding bronch searching for NTM pending progression. Advised staying off macrolides in case needed for NTM in future. -Dulera 200, Albuterol hfa, neb albuterol, ipratropium 0.06% nasal Covid vax- 3 Phizer -----Pt states he has been doing okay and denies any complaints. He continues parallel f/u with [facility] pulmonary/ vasculitis, also ENT and ID with [facility] cultured a couple of months ago. Repeat specimen sent 4 weeks ago, waiting for confirmation. Aware imaging shows progression of nodular inflammation.Cough and chest congestion slowly worse, not weather or seasonal. Daily routine morning neb albuterol then saline- only occ induces some sputum.  Dulera helpful when used intermittently- discussed availability change to Symbicort. No fever, shills, blood. Realizes Rituxin complicates.  Discussed adding Flutter device.  HRCT 03/08/21-  IMPRESSION: 1. There is extensive, clustered centrilobular and tree-in-bud nodularity as well as heterogeneous airspace opacity and  small consolidations, substantially increased compared to prior examination, particularly in the bilateral upper lobes. 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 2. Mild, diffuse bilateral bronchial wall thickening, consistent with nonspecific infectious or inflammatory bronchitis. 3. No evidence of fibrotic interstitial lung disease or specific pulmonary manifestations of vasculitis. 4. Numerous prominent mediastinal and bilateral hilar lymph nodes, unchanged, likely reactive.  ROS-see HPI + = positive Constitutional:   No-   weight loss, night sweats, fevers, chills, fatigue, lassitude. HEENT:   No-  headaches, difficulty swallowing,  tooth/dental problems, sore throat,       No-  sneezing, itching, +ear ache,+ nasal congestion, post nasal drip,  CV:  No-   chest pain, orthopnea, PND, swelling in lower extremities, anasarca,                                                    dizziness, palpitations Resp: shortness of breath with exertion or at rest.              No-   productive cough,  + non-productive cough,  No- coughing up of blood.              No-   change in color of mucus.  + wheezing.   Skin: No-   rash or lesions. GI:  No-   heartburn, indigestion, abdominal pain, nausea, vomiting,  GU:  MS:  +  joint pain or swelling.   Neuro-     nothing unusual Psych:  No- change in mood or affect. No depression or anxiety.  No memory loss.  OBJ- Physical Exam General- Alert, Oriented, Affect-appropriate, Distress- none acute,+ tall, thin,  + looks well today Skin- no rash Lymphadenopathy- none Head- atraumatic            Eyes- Gross vision intact, PERRLA, conjunctivae and secretions clear            Ears- Hearing, canals-normal            Nose- + marked turbinate edema, no-Septal dev, mucus, polyps, erosion, perforation             Throat- Mallampati II , mucosa clear , drainage- none, tonsils- atrophic Neck- flexible , trachea  midline, no stridor , thyroid nl, carotid no bruit Chest - symmetrical excursion , unlabored           Heart/CV- RRR , no murmur , no gallop  , no rub, nl s1 s2                           - JVD- none , edema- none, stasis changes- none, varices- none           Lung- + coarse wheeze, fine rhonchi, unlabored,  cough-none , dullness-none, rub- none,            Chest wall-  Abd-  Br/ Gen/ Rectal- Not done, not indicated Extrem- cyanosis- none, clubbing, none, atrophy- none, strength- nl Neuro- grossly intact to observation

## 2021-06-11 ENCOUNTER — Ambulatory Visit: Payer: 59 | Admitting: Internal Medicine

## 2021-06-11 ENCOUNTER — Encounter: Payer: Self-pay | Admitting: Internal Medicine

## 2021-06-11 ENCOUNTER — Other Ambulatory Visit: Payer: Self-pay

## 2021-06-11 VITALS — BP 116/64 | HR 65 | Temp 97.7°F | Ht 79.0 in | Wt 188.8 lb

## 2021-06-11 DIAGNOSIS — M301 Polyarteritis with lung involvement [Churg-Strauss]: Secondary | ICD-10-CM | POA: Diagnosis not present

## 2021-06-11 DIAGNOSIS — J42 Unspecified chronic bronchitis: Secondary | ICD-10-CM

## 2021-06-11 DIAGNOSIS — J45909 Unspecified asthma, uncomplicated: Secondary | ICD-10-CM

## 2021-06-11 DIAGNOSIS — R059 Cough, unspecified: Secondary | ICD-10-CM | POA: Diagnosis not present

## 2021-06-11 NOTE — Patient Instructions (Addendum)
Anticipate results of second culture from [facility] and their guidance.  Order-  Flutter device    blow through 4 times per set, 3 sets per day, when needed to loosen secretions  Order- replacement hose and mouth piece for nebulizer machine    dx chronic bronchitis   Order- schedule HRCT future- February 2023     dx Chronic Bronchitis

## 2021-06-12 ENCOUNTER — Encounter: Payer: Self-pay | Admitting: Internal Medicine

## 2021-06-12 DIAGNOSIS — M317 Microscopic polyangiitis: Secondary | ICD-10-CM | POA: Diagnosis not present

## 2021-06-12 DIAGNOSIS — J42 Unspecified chronic bronchitis: Secondary | ICD-10-CM | POA: Diagnosis not present

## 2021-06-12 DIAGNOSIS — Z6821 Body mass index (BMI) 21.0-21.9, adult: Secondary | ICD-10-CM | POA: Diagnosis not present

## 2021-06-12 DIAGNOSIS — B449 Aspergillosis, unspecified: Secondary | ICD-10-CM | POA: Diagnosis not present

## 2021-06-12 DIAGNOSIS — Z7952 Long term (current) use of systemic steroids: Secondary | ICD-10-CM | POA: Diagnosis not present

## 2021-06-12 DIAGNOSIS — L4059 Other psoriatic arthropathy: Secondary | ICD-10-CM | POA: Diagnosis not present

## 2021-06-12 NOTE — Assessment & Plan Note (Signed)
Sees some benefit from Dulera/ Symbicort and nebulizer Plan- adding Flutter to further help mucus clearing

## 2021-06-12 NOTE — Assessment & Plan Note (Addendum)
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 Aiming for 49-month update of CT for disease progression. We agreed 6 month was more frequent radiation than necessary.

## 2021-06-15 ENCOUNTER — Other Ambulatory Visit (HOSPITAL_COMMUNITY): Payer: Self-pay

## 2021-06-20 ENCOUNTER — Other Ambulatory Visit (HOSPITAL_COMMUNITY): Payer: Self-pay

## 2021-06-21 ENCOUNTER — Other Ambulatory Visit: Payer: Self-pay | Admitting: Internal Medicine

## 2021-06-21 ENCOUNTER — Other Ambulatory Visit (HOSPITAL_COMMUNITY): Payer: Self-pay

## 2021-06-21 MED ORDER — BUDESONIDE-FORMOTEROL FUMARATE 160-4.5 MCG/ACT IN AERO
2.0000 | INHALATION_SPRAY | Freq: Two times a day (BID) | RESPIRATORY_TRACT | 6 refills | Status: DC
Start: 2021-06-21 — End: 2022-05-14
  Filled 2021-06-21: qty 10.2, 30d supply, fill #0
  Filled 2021-07-31: qty 10.2, 30d supply, fill #1
  Filled 2021-08-30: qty 10.2, 30d supply, fill #2
  Filled 2021-10-06: qty 10.2, 30d supply, fill #3
  Filled 2021-12-07: qty 10.2, 30d supply, fill #4
  Filled 2022-03-03: qty 10.2, 30d supply, fill #5
  Filled 2022-04-13: qty 10.2, 30d supply, fill #6

## 2021-07-31 ENCOUNTER — Other Ambulatory Visit (HOSPITAL_COMMUNITY): Payer: Self-pay

## 2021-08-30 ENCOUNTER — Other Ambulatory Visit (HOSPITAL_COMMUNITY): Payer: Self-pay

## 2021-08-30 DIAGNOSIS — J310 Chronic rhinitis: Secondary | ICD-10-CM | POA: Diagnosis not present

## 2021-08-30 MED ORDER — MUPIROCIN 2 % EX OINT
TOPICAL_OINTMENT | CUTANEOUS | 12 refills | Status: DC
  Filled 2021-08-30: qty 22, 7d supply, fill #0
  Filled 2021-12-07: qty 22, 7d supply, fill #1
  Filled 2022-04-13: qty 22, 7d supply, fill #2
  Filled 2022-05-14: qty 22, 7d supply, fill #3

## 2021-09-14 ENCOUNTER — Other Ambulatory Visit (HOSPITAL_COMMUNITY): Payer: Self-pay

## 2021-09-18 ENCOUNTER — Other Ambulatory Visit (HOSPITAL_COMMUNITY): Payer: Self-pay

## 2021-09-18 DIAGNOSIS — L4050 Arthropathic psoriasis, unspecified: Secondary | ICD-10-CM | POA: Diagnosis not present

## 2021-09-18 DIAGNOSIS — I7782 Antineutrophilic cytoplasmic antibody (ANCA) vasculitis: Secondary | ICD-10-CM | POA: Diagnosis not present

## 2021-09-18 DIAGNOSIS — R093 Abnormal sputum: Secondary | ICD-10-CM | POA: Diagnosis not present

## 2021-09-18 DIAGNOSIS — J329 Chronic sinusitis, unspecified: Secondary | ICD-10-CM | POA: Diagnosis not present

## 2021-09-18 DIAGNOSIS — M2550 Pain in unspecified joint: Secondary | ICD-10-CM | POA: Diagnosis not present

## 2021-09-18 DIAGNOSIS — D849 Immunodeficiency, unspecified: Secondary | ICD-10-CM | POA: Diagnosis not present

## 2021-09-18 DIAGNOSIS — Z792 Long term (current) use of antibiotics: Secondary | ICD-10-CM | POA: Diagnosis not present

## 2021-09-18 DIAGNOSIS — Z7951 Long term (current) use of inhaled steroids: Secondary | ICD-10-CM | POA: Diagnosis not present

## 2021-09-18 DIAGNOSIS — M3130 Wegener's granulomatosis without renal involvement: Secondary | ICD-10-CM | POA: Diagnosis not present

## 2021-09-18 MED ORDER — MYCOPHENOLATE MOFETIL 500 MG PO TABS
ORAL_TABLET | ORAL | 0 refills | Status: DC
  Filled 2021-09-18: qty 360, 90d supply, fill #0

## 2021-09-19 ENCOUNTER — Other Ambulatory Visit (HOSPITAL_COMMUNITY): Payer: Self-pay

## 2021-09-21 ENCOUNTER — Encounter: Payer: Self-pay | Admitting: Family Medicine

## 2021-09-21 ENCOUNTER — Telehealth (INDEPENDENT_AMBULATORY_CARE_PROVIDER_SITE_OTHER): Payer: 59 | Admitting: Family Medicine

## 2021-09-21 DIAGNOSIS — K2100 Gastro-esophageal reflux disease with esophagitis, without bleeding: Secondary | ICD-10-CM

## 2021-09-21 DIAGNOSIS — M317 Microscopic polyangiitis: Secondary | ICD-10-CM | POA: Diagnosis not present

## 2021-09-21 NOTE — Progress Notes (Signed)
Virtual Visit via Video Note  I connected with Jesse Wong on 09/21/21 at  1:00 PM EST by a video enabled telemedicine application and verified that I am speaking with the correct person using two identifiers.   I discussed the limitations of evaluation and management by telemedicine and the availability of in person appointments. The patient expressed understanding and agreed to proceed.  Patient location: at home Provider location: in office  Subjective:    CC:   Chief Complaint  Patient presents with   Gastroesophageal Reflux    HPI: He was previously taking his PPI pretty regularly and symptoms were really well controlled in fact all the symptoms that he been having previously seem to resolve.  He decided to stop the medication and about a week later started to get more significant and severe reflux symptoms.  He says overall he really does a great job in his diet choices.  He mostly drinks water and avoids caffeine, carbonation, and even alcohol.  He avoids greasy spicy foods.  And still will sometimes have some reflux.  He has been just taking the PPI occasionally maybe a couple times.  Also recently seen by ENT at [facility] and they noticed a frothiness in the back of his throat on exam which they felt could be secondary to reflux.  They encouraged him to consider taking the PPI but also or hesitant to have him take it for prolonged periods and encouraged him to speak with PCP about it.  He is currently being evaluated and treated by a care team at [facility] for his ANCA associated vasculitis.  He has started CellCept today.  He is a little bit worried about the potential side effects of the medication.  He has been really careful to try to minimize his exposure to people who are ill and sick.  With his vasculitis if he gets sick he gets extremely congested and short of breath and it takes a lot for his immune system to respond.  He does usually benefit from  prednisone.  Past medical history, Surgical history, Family history not pertinant except as noted below, Social history, Allergies, and medications have been entered into the medical record, reviewed, and corrections made.    Objective:    General: Speaking clearly in complete sentences without any shortness of breath.  Alert and oriented x3.  Normal judgment. No apparent acute distress.    Impression and Recommendations:    Problem List Items Addressed This Visit       Cardiovascular and Mediastinum   Microscopic polyangiitis (HCC)    Started CellCept today.  If he feels like he is experiencing any nausea more than happy to call in some Zofran as needed.        Digestive   GERD    Mostly having intermittent symptoms at this point.  We discussed okay to use PPI more as needed.  If he is having a more significant flare than he might even want to take it for several days or even an entire week to try to get symptoms back under control.  Also okay to use Tums.  Discussed the long-term side effects of PPIs and always weighing out the pros and cons of continued therapy with side effects of not controlling reflux versus the medication itself.  I think for now because the symptoms seem more intermittent than it is reasonable to use the medication intermittently.       He has been  advised to [facility] that it is safe for him to go ahead and get another pneumonia vaccine.  Recommend that he be given the Prevnar 20.  Okay to schedule nurse visit for that visit at his convenience.  No orders of the defined types were placed in this encounter.   No orders of the defined types were placed in this encounter.    I discussed the assessment and treatment plan with the patient. The patient was provided an opportunity to ask questions and all were answered. The patient agreed with the plan and demonstrated an understanding of the instructions.   The patient was advised to call back or seek an  in-person evaluation if the symptoms worsen or if the condition fails to improve as anticipated.   Beatrice Lecher, MD

## 2021-09-21 NOTE — Progress Notes (Signed)
Pt stated he is having abnormal reflux. He had been taking omeprazole x 4 months. He stated that he was seen by ENT and they did a scope and he was advised to stop omeprazole and f/u with pcp.   Pt reports that he drinks water, doesn't eat any spicy or highly seasoned foods but he continues to experience GERD really bad.

## 2021-09-21 NOTE — Assessment & Plan Note (Signed)
Mostly having intermittent symptoms at this point.  We discussed okay to use PPI more as needed.  If he is having a more significant flare than he might even want to take it for several days or even an entire week to try to get symptoms back under control.  Also okay to use Tums.  Discussed the long-term side effects of PPIs and always weighing out the pros and cons of continued therapy with side effects of not controlling reflux versus the medication itself.  I think for now because the symptoms seem more intermittent than it is reasonable to use the medication intermittently.

## 2021-09-21 NOTE — Assessment & Plan Note (Signed)
Started CellCept today.  If he feels like he is experiencing any nausea more than happy to call in some Zofran as needed.

## 2021-10-06 ENCOUNTER — Other Ambulatory Visit (HOSPITAL_COMMUNITY): Payer: Self-pay

## 2021-10-08 DIAGNOSIS — M3130 Wegener's granulomatosis without renal involvement: Secondary | ICD-10-CM | POA: Diagnosis not present

## 2021-10-09 ENCOUNTER — Other Ambulatory Visit (HOSPITAL_COMMUNITY): Payer: Self-pay

## 2021-10-11 ENCOUNTER — Other Ambulatory Visit (HOSPITAL_COMMUNITY): Payer: Self-pay

## 2021-10-11 ENCOUNTER — Encounter: Payer: Self-pay | Admitting: Family Medicine

## 2021-10-11 ENCOUNTER — Ambulatory Visit (INDEPENDENT_AMBULATORY_CARE_PROVIDER_SITE_OTHER): Payer: 59

## 2021-10-11 ENCOUNTER — Other Ambulatory Visit: Payer: Self-pay

## 2021-10-11 ENCOUNTER — Ambulatory Visit: Payer: 59 | Admitting: Family Medicine

## 2021-10-11 VITALS — BP 130/73 | HR 60 | Ht 79.0 in | Wt 188.0 lb

## 2021-10-11 DIAGNOSIS — M549 Dorsalgia, unspecified: Secondary | ICD-10-CM | POA: Diagnosis not present

## 2021-10-11 DIAGNOSIS — Z23 Encounter for immunization: Secondary | ICD-10-CM | POA: Diagnosis not present

## 2021-10-11 DIAGNOSIS — M546 Pain in thoracic spine: Secondary | ICD-10-CM | POA: Diagnosis not present

## 2021-10-11 MED ORDER — CYCLOBENZAPRINE HCL 10 MG PO TABS
10.0000 mg | ORAL_TABLET | Freq: Every evening | ORAL | 0 refills | Status: DC | PRN
  Filled 2021-10-11: qty 30, 30d supply, fill #0

## 2021-10-11 NOTE — Patient Instructions (Signed)
Increase Aleve to 2 tabs twice a day for 5 days. Take with food.

## 2021-10-11 NOTE — Progress Notes (Signed)
Pt reports that he began experiencing this back pain about 2-3 weeks ago. Denies any injury/trauma heavy lifting.  He feels that the pain is mostly affecting the top his lumbar to mid back.

## 2021-10-11 NOTE — Progress Notes (Signed)
Acute Office Visit  Subjective:    Patient ID: Jesse Wong, male    DOB: [birth year], 40 y.o.   MRN: [id number]  Chief Complaint  Patient presents with   Back Pain    HPI Patient is in today for midback back pain.  Started about 2 weeks ago after he got up out of bed.  He does not member any injury or trauma or aggravating event.  It started when he got up and was standing looking in the mirror getting ready and started to feel some discomfort over the lower thoracic spine.  Dull pain about 7/10.  Aleve helps some.  Currently taking 2 tabs once a day.  Straight back chair helps.   Certain lumbar supports actually aggravate it.  1 or more straight cushioned chair does help.  Not keeping him awake at night he is able to partially sleep on his stomach and get some relief.  History of psoriatic arthritis.  Occasionally will get a zinging sensation down the leg but it usually just last for few seconds and as soon as he changes positions it stops.  No fevers or unusual rash.  He has been having some skin peeling which usually happens when he comes off of a steroid.  Has tried heat/ice-neither helpful.  Feels like it is "bone-on-bone".  He did start the CellCept recently and has been having a lot of nausea.  He says he actually feels better if he takes it without food if he tries to eat and then take the medication he feels worse.  I will get some epigastric discomfort but he does not feel like the epigastric pain is radiating into his back.  Past Medical History:  Diagnosis Date   Allergy    Arthritis    Asthma    Chronic prostatitis    Myalgia    Pulmonary infiltrates    Sinusitis, chronic    Tonsillar abscess    Vasculitis ([name])     Past Surgical History:  Procedure Laterality Date   BRONCHOSCOPY  2011   TBBX ----inflammation   IR FLUORO GUIDE CV LINE RIGHT  01/31/2017   IR FLUORO GUIDE CV MIDLINE PICC RIGHT  01/30/2017   IR US GUIDE VASC ACCESS RIGHT  01/30/2017   peritonsilar  abscess     TYMPANOPLASTY     VIDEO BRONCHOSCOPY Bilateral 06/25/2018   Procedure: VIDEO BRONCHOSCOPY WITHOUT FLUORO;  Surgeon: [name], MD;  Location: [facility] END[facility]COPY;  Service: Cardiopulmonary;  Laterality: Bilateral;    Family History  Problem Relation Age of Onset   Heart attack Other        grandmother   Hyperlipidemia Father    Thyroid disease Father        hashimoto's thyroidistis   Heart disease Paternal Grandfather    Colon cancer Neg Hx    Stomach cancer Neg Hx    Rectal cancer Neg Hx    Esophageal cancer Neg Hx     Social History   Socioeconomic History   Marital status: Married    Spouse name: Not on file   Number of children: 1   Years of education: Not on file   Highest education level: Not on file  Occupational History   Occupation: Nuclear Medicine    Employer: [employer]  Tobacco Use   Smoking status: Never   Smokeless tobacco: Never  Vaping Use   Vaping Use: Never used  Substance and Sexual Activity   Alcohol use: Yes    Comment: occasional  Drug use: No   Sexual activity: Not on file    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  Other Topics Concern   Not on file  Social History Narrative   Not on file   Social Determinants of Health   Financial Resource Strain: Not on file  Food Insecurity: Not on file  Transportation Needs: Not on file  Physical Activity: Not on file  Stress: Not on file  Social Connections: Not on file  Intimate Partner Violence: Not on file    Outpatient Medications Prior to Visit  Medication Sig Dispense Refill   albuterol (PROVENTIL) (2.5 MG/3ML) 0.083% nebulizer solution Inhale 1 vial via nebulizer every 6 hours as needed for wheezing. 180 mL 11   albuterol (VENTOLIN HFA) 108 (90 Base) MCG/ACT inhaler Inhale 2 puffs into the lungs every 6 (six) hours as needed for wheezing or shortness of breath. 18 g 5   budesonide-formoterol (SYMBICORT) 160-4.5 MCG/ACT inhaler Inhale 2 puffs into  the lungs 2 (two) times daily. 10.2 g 6   CALCIUM PO Take 1 tablet by mouth 2 (two) times daily.     cholecalciferol (VITAMIN D) 1000 UNITS tablet Take 1 tablet (1,000 Units total) by mouth daily. (Patient taking differently: Take 1,000 Units by mouth 2 (two) times daily.) 30 tablet 6   fluticasone (FLONASE) 50 MCG/ACT nasal spray INSTILL 2 SPRAYS INTO BOTH NOSTRILS DAILY (NEED APPT FOR FURTHER REFILLS) 16 g 0   Multiple Vitamin (MULTIVITAMIN WITH MINERALS) TABS tablet Take 1 tablet by mouth 2 (two) times daily.     mupirocin ointment (BACTROBAN) 2 % Use with nasal rinse bottle 22 g 12   mycophenolate (CELLCEPT) 500 MG tablet Take 1 tablet (500 mg total) by mouth 2 times a day for 7 days, then 2 tablets (1,000 mg total) 2 times a day. 374 tablet 0   naproxen sodium (ALEVE) 220 MG tablet Take 440 mg by mouth 2 (two) times daily as needed (headache/pain).     Nebulizers (COMPRESSOR/NEBULIZER) MISC Use as direccted 1 each 0   omeprazole (PRILOSEC) 20 MG capsule Take 1 capsule (20 mg total) by mouth daily. 90 capsule 0   pravastatin (PRAVACHOL) 20 MG tablet TAKE 1 TABLET BY MOUTH ONCE DAILY 90 tablet 3   sodium chloride HYPERTONIC 3 % nebulizer solution Inhale 4 mL by nebulization 2  times a day. 240 mL 11   mometasone-formoterol (DULERA) 200-5 MCG/ACT AERO Inhale 2 puffs by mouth 2 times a day. 13 g 11   mupirocin ointment (BACTROBAN) 2 % Use with nasal rinse bottle as directed 22 g 0   Facility-Administered Medications Prior to Visit  Medication Dose Route Frequency Provider Last Rate Last Admin   diphenhydrAMINE (BENADRYL) injection 50 mg  50 mg Intramuscular Once PRN Causey, Larna Daughters, NP       EPINEPHrine (EPI-PEN) injection 0.3 mg  0.3 mg Intramuscular Once PRN Loa Socks, NP       methylPREDNISolone sodium succinate (SOLU-MEDROL) 125 mg/2 mL injection 125 mg  125 mg Intramuscular Once PRN Causey, Larna Daughters, NP        Allergies  Allergen Reactions   Lipitor  [Atorvastatin] Other (See Comments)    myalgias    Review of Systems     Objective:    Physical Exam Vitals reviewed.  Constitutional:      Appearance: He is well-developed.  HENT:     Head: Normocephalic and atraumatic.  Eyes:     Conjunctiva/sclera: Conjunctivae normal.  Cardiovascular:     Rate and Rhythm: Normal rate.  Pulmonary:     Effort: Pulmonary effort is normal.  Musculoskeletal:     Comments: Normal lumbar flexion and extension, rotation and side bending.  There was some increased discomfort with side bending to the right.  Mild tenderness over the thoracic lower spine area and some tenderness over the left paraspinous muscles, just lateral to that area  Skin:    General: Skin is dry.     Coloration: Skin is not pale.  Neurological:     Mental Status: He is alert and oriented to person, place, and time.  Psychiatric:        Behavior: Behavior normal.    BP 130/73    Pulse 60    Ht 6\' 7"  (2.007 m)    Wt 188 lb (85.3 kg)    SpO2 96%    BMI 21.18 kg/m  Wt Readings from Last 3 Encounters:  10/11/21 188 lb (85.3 kg)  06/11/21 188 lb 12.8 oz (85.6 kg)  04/02/21 187 lb (84.8 kg)     No results found for: HGBA1C     Assessment & Plan:   Problem List Items Addressed This Visit   None Visit Diagnoses     Mid back pain    -  Primary   Relevant Medications   cyclobenzaprine (FLEXERIL) 10 MG tablet   Other Relevant Orders   DG Thoracic Spine 2 View   Need for pneumococcal vaccination       Relevant Orders   Pneumococcal conjugate vaccine 20-valent (Prevnar 20) (Completed)      Lower thoracic spine pain-his course is a little bit unusual without any specific trigger or exacerbating symptoms.  We will increase Aleve to 2 tabs twice a day which is the max dose.  Make sure to take with food.  We will do a trial of muscle relaxer as well.  We will get plain film x-ray since course is a little bit unusual.  Given handout on stretches to do on his own at home.  If  x-ray is reassuring and not improving over the next 2 to 3 weeks then consider further work-up.  Meds ordered this encounter  Medications   cyclobenzaprine (FLEXERIL) 10 MG tablet    Sig: Take 1 tablet (10 mg total) by mouth at bedtime as needed for muscle spasms.    Dispense:  30 tablet    Refill:  0     Beatrice Lecher, MD

## 2021-10-12 NOTE — Progress Notes (Signed)
HI BJ, xray looks reassuring. If not better in a couple of weeks we can get further imagine. If your pan changes or worsens then please let me know.

## 2021-10-18 ENCOUNTER — Other Ambulatory Visit (HOSPITAL_COMMUNITY): Payer: Self-pay

## 2021-10-18 ENCOUNTER — Encounter: Payer: Self-pay | Admitting: Family Medicine

## 2021-10-18 DIAGNOSIS — M545 Low back pain, unspecified: Secondary | ICD-10-CM

## 2021-10-18 MED ORDER — MELOXICAM 15 MG PO TABS
15.0000 mg | ORAL_TABLET | Freq: Every day | ORAL | 0 refills | Status: DC
  Filled 2021-10-18: qty 30, 30d supply, fill #0

## 2021-10-18 MED ORDER — PREDNISONE 20 MG PO TABS
40.0000 mg | ORAL_TABLET | Freq: Every day | ORAL | 0 refills | Status: DC
  Filled 2021-10-18: qty 10, 5d supply, fill #0

## 2021-10-19 ENCOUNTER — Other Ambulatory Visit: Payer: Self-pay

## 2021-10-19 ENCOUNTER — Ambulatory Visit (INDEPENDENT_AMBULATORY_CARE_PROVIDER_SITE_OTHER): Payer: 59

## 2021-10-19 DIAGNOSIS — M545 Low back pain, unspecified: Secondary | ICD-10-CM | POA: Diagnosis not present

## 2021-10-22 ENCOUNTER — Telehealth: Payer: Self-pay | Admitting: Family Medicine

## 2021-10-22 DIAGNOSIS — M317 Microscopic polyangiitis: Secondary | ICD-10-CM

## 2021-10-22 DIAGNOSIS — M549 Dorsalgia, unspecified: Secondary | ICD-10-CM

## 2021-10-22 DIAGNOSIS — M545 Low back pain, unspecified: Secondary | ICD-10-CM

## 2021-10-22 NOTE — Progress Notes (Signed)
Hi Jesse Wong , xray looks good.  I put in the order for the MRIs so we will let you know once we get the PA.

## 2021-10-22 NOTE — Telephone Encounter (Signed)
Orders Placed This Encounter  Procedures   MR Lumbar Spine Wo Contrast    Standing Status:   Future    Standing Expiration Date:   10/22/2022    Order Specific Question:   What is the patient's sedation requirement?    Answer:   No Sedation    Order Specific Question:   Does the patient have a pacemaker or implanted devices?    Answer:   No    Order Specific Question:   Preferred imaging location?    Answer:   [facility] (table limit-350lbs)   MR Thoracic Spine Wo Contrast    Standing Status:   Future    Standing Expiration Date:   10/22/2022    Order Specific Question:   What is the patient's sedation requirement?    Answer:   No Sedation    Order Specific Question:   Does the patient have a pacemaker or implanted devices?    Answer:   No    Order Specific Question:   Preferred imaging location?    Answer:   [facility] (table limit-350lbs)

## 2021-10-23 ENCOUNTER — Other Ambulatory Visit: Payer: Self-pay

## 2021-10-23 ENCOUNTER — Ambulatory Visit (INDEPENDENT_AMBULATORY_CARE_PROVIDER_SITE_OTHER): Payer: 59

## 2021-10-23 ENCOUNTER — Other Ambulatory Visit (HOSPITAL_COMMUNITY): Payer: Self-pay

## 2021-10-23 DIAGNOSIS — M549 Dorsalgia, unspecified: Secondary | ICD-10-CM

## 2021-10-23 DIAGNOSIS — M545 Low back pain, unspecified: Secondary | ICD-10-CM | POA: Diagnosis not present

## 2021-10-23 DIAGNOSIS — M317 Microscopic polyangiitis: Secondary | ICD-10-CM | POA: Diagnosis not present

## 2021-10-23 MED ORDER — MYCOPHENOLATE SODIUM 360 MG PO TBEC
DELAYED_RELEASE_TABLET | ORAL | 2 refills | Status: DC
  Filled 2021-10-23: qty 106, 30d supply, fill #0
  Filled 2022-01-03: qty 106, 26d supply, fill #1
  Filled 2022-03-03: qty 66, 12d supply, fill #2
  Filled 2022-03-04: qty 40, 14d supply, fill #2
  Filled 2022-04-13: qty 106, 26d supply, fill #3

## 2021-10-23 NOTE — Telephone Encounter (Signed)
Please call imaging and find out what Tesla strength are open MRI is downstairs when it comes to [place].  Patient is willing to go to [facility] if it is a higher Tesla strength which might be better for spine imaging.

## 2021-10-24 NOTE — Progress Notes (Signed)
Hi BJ, the lumbar spine shows no narrowing of the canal or narrowing of the nerve root passages.  Which is great.  You do have a little bit of curvature of the spine.  No disc bulges which is fantastic.  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 That means the position of the S1 vertebrae is more in alignment with the lumbar spine versus being in alignment with your sacrum. This is usually something you were born with.  It does increase risk of pain in that area over your lifetime.

## 2021-10-24 NOTE — Progress Notes (Signed)
Hi BJ, thoracic spine MRI shows no spinal canal stenosis or narrowing where the nerve roots exit out.  Again just a little bit of curvature to her spine.  No worrisome findings.  Overall your back looks pretty great.  If you are continuing to feel better then we can just work on some gentle stretches at home.  If you feel like you are still having some soreness especially with position change then we can always refer you to PT and have them do some additional muscle work.  Just let me know.

## 2021-10-25 ENCOUNTER — Other Ambulatory Visit: Payer: Self-pay | Admitting: Family Medicine

## 2021-10-25 ENCOUNTER — Other Ambulatory Visit (HOSPITAL_COMMUNITY): Payer: Self-pay

## 2021-10-25 MED ORDER — PREDNISONE 20 MG PO TABS
40.0000 mg | ORAL_TABLET | Freq: Every day | ORAL | 0 refills | Status: DC
  Filled 2021-10-25: qty 10, 5d supply, fill #0

## 2021-10-26 NOTE — Telephone Encounter (Signed)
amb  

## 2021-10-26 NOTE — Telephone Encounter (Signed)
Meds ordered this encounter  Medications   DISCONTD: predniSONE (DELTASONE) 20 MG tablet    Sig: Take 2 tablets (40 mg total) by mouth daily with breakfast.    Dispense:  10 tablet    Refill:  0   meloxicam (MOBIC) 15 MG tablet    Sig: Take 1 tablet (15 mg total) by mouth daily.    Dispense:  30 tablet    Refill:  0   Orders Placed This Encounter  Procedures   DG Lumbar Spine Complete    Standing Status:   Future    Number of Occurrences:   1    Standing Expiration Date:   10/18/2022    Order Specific Question:   Reason for Exam (SYMPTOM  OR DIAGNOSIS REQUIRED)    Answer:   low back pain for several weeks    Order Specific Question:   Preferred imaging location?    Answer:   [facility]   Ambulatory referral to Physical Therapy    Referral Priority:   Routine    Referral Type:   Physical Medicine    Referral Reason:   Specialty Services Required    Requested Specialty:   Physical Therapy    Number of Visits Requested:   1

## 2021-10-26 NOTE — Addendum Note (Signed)
Addended by: Nani Gasser D on: 10/26/2021 01:19 PM   Modules accepted: Orders

## 2021-10-26 NOTE — Addendum Note (Signed)
Addended by: Nani Gasser D on: 10/26/2021 11:50 AM   Modules accepted: Orders

## 2021-11-07 NOTE — Therapy (Addendum)
OUTPATIENT PHYSICAL THERAPY THORACOLUMBAR EVALUATION   Patient Name: Jesse Wong MRN: [id number] DOB:07/02/1981, 41 y.o., male Today's Date: 11/08/2021   PT End of Session - 11/08/21 0849     Visit Number 1    Number of Visits 16    Date for PT Re-Evaluation 01/03/22    Authorization Type [employer] employee    Progress Note Due on Visit 10    PT Start Time 0850    PT Stop Time 0933    PT Time Calculation (min) 43 min    Activity Tolerance Patient tolerated treatment well             Past Medical History:  Diagnosis Date   Allergy    Arthritis    Asthma    Chronic prostatitis    Myalgia    Pulmonary infiltrates    Sinusitis, chronic    Tonsillar abscess    Vasculitis ([place])    Past Surgical History:  Procedure Laterality Date   BRONCHOSCOPY  2011   TBBX ----inflammation   IR FLUORO GUIDE CV LINE RIGHT  01/31/2017   IR FLUORO GUIDE CV MIDLINE PICC RIGHT  01/30/2017   IR US GUIDE VASC ACCESS RIGHT  01/30/2017   peritonsilar abscess     TYMPANOPLASTY     VIDEO BRONCHOSCOPY Bilateral 06/25/2018   Procedure: VIDEO BRONCHOSCOPY WITHOUT FLUORO;  Surgeon: [name], MD;  Location: [facility];  Service: Cardiopulmonary;  Laterality: Bilateral;   Patient Active Problem List   Diagnosis Date Noted   Coccygeal pain 06/16/2020   Nose injury, initial encounter 06/16/2020   Atherosclerosis of aorta ([place]) 04/12/2019   Pulmonary infiltrates    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   Medication monitoring encounter 03/03/2018   Pulmonary nodule seen on imaging study 01/15/2018   Microscopic polyangiitis ([place]) 03/14/2016   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   Dyshidrotic eczema 04/08/2014   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   MYALGIA 06/28/2010   GERD 06/27/2010   Nonspecific (abnormal) findings on radiological and other examination of body structure 05/29/2010   CT, CHEST, ABNORMAL 05/29/2010   Asthma with bronchitis 04/09/2010   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 States that the pain radiated from thoracic to tailbone. States that he has been having a lot of tailbone pain since COVID as he now sits a lot with virtual meetings. States since the start of COVID he has been using a tailbone cushion to sit on. Reports that hsi pain was different and very intense. He trailed different mattresses but does not think his mattress is the thing that is botheirng him. Reports he bought a Tour manager with stiff/firm support and that seems to help. He does note that he started a new drug, CellCept, 3 days prior to his onset of back pain and one of the side effects is back pain. Reports he has stopped taking the drug but that he is supposed to start that back up but he doesn't want to. Reports overall since the onset of pain his pain is better.     PERTINENT HISTORY:  psoriatic arthritis, Vasculitis, low bone density, - has been on multiple drugs for autoimmune - CellCept most recent   PAIN:  Are you having pain? Yes NPRS scale: 3/10 Pain location: thoracic spine to tail bone Pain orientation: mid PAIN TYPE: aching Pain description: intermittent  Aggravating factors: no lumbar/firm support Relieving factors: massage, gamers chair, lumbar support  PRECAUTIONS: Other: possible  osteopenia   FALLS:  Has patient fallen in last 6 months? 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 No spinal canal stenosis or neural foraminal narrowing. 2. 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   MR LUMBAR SPINE IMPRESSION   1. No spinal canal stenosis or neural foraminal narrowing. 2. 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 3. 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 Please correlate with imaging if any intervention is planned.      SCREENING FOR RED FLAGS: Bowel or bladder incontinence: No Spinal tumors: No Cauda equina syndrome: No   COGNITION:  Overall cognitive status: Within functional limits for tasks assessed      MUSCLE LENGTH: assess next session Hamstrings: Right  deg; Left  deg Marcello Moores test: Right deg; Left  deg  POSTURE:  Reduced lumbar lordosis, sacral sitting, hitches at thoracolumbar and lumbosacral junctions  Observation: left hip more restricted than right     LUMBARAROM/PROM  A/PROM A/PROM  11/08/2021  Flexion 85% limited*  Extension 85% limited  Right lateral flexion 50% limited  Left lateral flexion 50% limited  Right rotation   Left rotation    (Blank rows = not tested) *Hitches at T11 SP pain with flexion  Lower Extremity Right 11/08/2021 Left 11/08/2021   A/PROM MMT A/PROM MMT  Hip Flexion  5  5  Hip Extension 15 (VE) 4* 10 (VE) 4*  Hip Abduction      Hip Adduction      Hip Internal rotation WNL  WNL   Hip External rotation WNL  WNL   Knee Flexion  4  4  Knee Extension  5  5  Ankle Dorsiflexion      Ankle Plantarflexion      Ankle Inversion      Ankle Eversion       (Blank rows = not tested)  *Compensates in lumbar/thoracic  paraspinals   (VE)= visual estimation  LUMBAR SPECIAL TESTS:  Straight leg raise test: Negative, Slump test: Negative, and Ely's Positive left    TODAY'S TREATMENT  11/08/21 Therapeutic Exercise:  Aerobic: Supine: Prone: prone on elbows 2 minutes total, hip extension x5 bilat  Seated: "active posture" x2  Standing: Neuromuscular Re-education: Manual Therapy: Therapeutic Activity: Self Care: Trigger Point Dry Needling:  Modalities:     PATIENT EDUCATION:  Education details: on current presentation, on HEP, on clinical outcomes score and POC, on posture, on current presentation, on ideal sitting, difference between active and passive posture, on how pain presents in musculoskeletal presentation Person educated: Patient Education method: Explanation, Demonstration, and Handouts Education comprehension: verbalized understanding    HOME EXERCISE PROGRAM: [email or web address]  ASSESSMENT:  CLINICAL IMPRESSION: Patient is a 41 y.o. male who was seen today for physical therapy evaluation and treatment for back pain. Objective impairments include decreased mobility, decreased ROM, decreased strength, postural dysfunction, and pain. These impairments are limiting patient from  sitting,walking (when aggravated) . Personal factors including 1 comorbidity: psoriatic arthritis, 1-2 comorbidities: Vasculitis, and 3+ comorbidities: multiple courses of immunosuppressive drugs  are also affecting patient's functional outcome. Patient will benefit from skilled PT to address above impairments and improve overall function.  Patient presents to therapy with acute onset of back pain that patient correlates to new drug that he started 3 days prior to the onset of his back pain. Pain has improved since being off the drug but he is hesitant to go back on therapeutic drug as he was in debilitating pain. Patient does present with ROM, strength and postural limitations that could be contributing to recent onset of  back pain and symptoms due seem to be musculoskeletal in nature. Able to reduce symptoms with sitting in more active posture on this date. Discussed how developing HEP for LBP may improve patient's comfort in taking recommended drug therapies. Discussed how all changes in medications should be consulted with MD prior. Patient makes an excellent candidate for skilled Physical therapy to address limitations and improve overall function and quality of life.   REHAB POTENTIAL: Good   CLINICAL DECISION MAKING: Evolving/moderate complexity  EVALUATION COMPLEXITY: Moderate   GOALS: Goals reviewed with patient? Yes  SHORT TERM GOALS:  STG Name Target Date Goal status  1 Patient will be independent in self management strategies to improve quality of life and functional outcomes. Baseline:  12/06/2021  INITIAL  2 Patient will report at least 50% improvement in overall symptoms and/or function to demonstrate improved functional mobility Baseline: 0% 12/06/2021  INITIAL  3 Patient will be able to demonstrate improved lumbar mobility by at least 25% Baseline: 12/06/2021  INITIAL  4  Baseline:    5  Baseline:    6  Baseline:    7  Baseline:     LONG TERM GOALS:   LTG Name Target Date Goal status  1 Patient will report at least 75% improvement in overall symptoms and/or function to demonstrate improved functional mobility Baseline:0% 01/03/2022  INITIAL  2 Patient will be able to sit in good upright posture on ischial tuberosities for at least 1 minute to demonstrate improved active sitting posture.  Baseline: 01/03/2022  INITIAL  3 Patient will be able to demonstrate at least 4+ MMT in hip extensors Baseline: 01/03/2022  INITIAL  4  Baseline:    5  Baseline:    6  Baseline:    7  Baseline:       PLAN: PT FREQUENCY:  2x/week  PT DURATION: 8 weeks  PLANNED INTERVENTIONS: Therapeutic exercises, Therapeutic activity, Neuro Muscular re-education, Balance training, Gait training,  Patient/Family education, Joint mobilization, Dry Needling, Cryotherapy, Moist heat, and Manual therapy  PLAN FOR NEXT SESSION: assess thomas test and hamstring length, lumbar mobility focus on lumbar lordosis(lay over towel), core strengthening/activation, glute strengthening   11:03 AM, 11/09/21 Jerene Pitch, DPT Physical Therapy with [facility]  [phone] office

## 2021-11-08 ENCOUNTER — Other Ambulatory Visit: Payer: Self-pay

## 2021-11-08 ENCOUNTER — Ambulatory Visit (INDEPENDENT_AMBULATORY_CARE_PROVIDER_SITE_OTHER): Payer: 59 | Admitting: Physical Therapy

## 2021-11-08 DIAGNOSIS — M545 Low back pain, unspecified: Secondary | ICD-10-CM

## 2021-11-08 DIAGNOSIS — M549 Dorsalgia, unspecified: Secondary | ICD-10-CM

## 2021-11-12 NOTE — Therapy (Signed)
OUTPATIENT PHYSICAL THERAPY TREATMENT NOTE   Patient Name: Jesse Wong MRN: [id number] DOB:[birth year], 41 y.o., male Today's Date: 11/13/2021  PCP: Hali Marry, MD REFERRING PROVIDER: Hali Marry, *   PT End of Session - 11/13/21 0803     Visit Number 2    Number of Visits 16    Date for PT Re-Evaluation 01/03/22    Authorization Type [employer] employee    Progress Note Due on Visit 10    PT Start Time 0803    PT Stop Time 0843    PT Time Calculation (min) 40 min    Activity Tolerance Patient tolerated treatment well             Past Medical History:  Diagnosis Date   Allergy    Arthritis    Asthma    Chronic prostatitis    Myalgia    Pulmonary infiltrates    Sinusitis, chronic    Tonsillar abscess    Vasculitis ([place])    Past Surgical History:  Procedure Laterality Date   BRONCHOSCOPY  2011   TBBX ----inflammation   IR FLUORO GUIDE CV LINE RIGHT  01/31/2017   IR FLUORO GUIDE CV MIDLINE PICC RIGHT  01/30/2017   IR US GUIDE VASC ACCESS RIGHT  01/30/2017   peritonsilar abscess     TYMPANOPLASTY     VIDEO BRONCHOSCOPY Bilateral 06/25/2018   Procedure: VIDEO BRONCHOSCOPY WITHOUT FLUORO;  Surgeon: [name], MD;  Location: [facility];  Service: Cardiopulmonary;  Laterality: Bilateral;   Patient Active Problem List   Diagnosis Date Noted   Coccygeal pain 06/16/2020   Nose injury, initial encounter 06/16/2020   Atherosclerosis of aorta ([place]) 04/12/2019   Pulmonary infiltrates    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[place]) 03/03/2018   Medication monitoring encounter 03/03/2018   Pulmonary nodule seen on imaging study 01/15/2018   Microscopic polyangiitis ([place]) 03/14/2016   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   Dyshidrotic eczema 04/08/2014   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   MYALGIA 06/28/2010   GERD 06/27/2010   Nonspecific (abnormal) findings on radiological and other examination of body structure 05/29/2010   CT, CHEST, ABNORMAL  05/29/2010   Asthma with bronchitis 04/09/2010   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    PCP: Hali Marry, MD   REFERRING PROVIDER: Hali Marry, *   REFERRING DIAG: M54.50 (ICD-10-CM) - Acute midline low back pain, unspecified whether sciatica present   THERAPY DIAG:  Acute midline low back pain, unspecified whether sciatica present - Plan: PT plan of care cert/re-cert   Mid back pain - Plan: PT plan of care cert/re-cert   ONSET DATE: December 2022   SUBJECTIVE:  SUBJECTIVE STATEMENT: States he is feeling pretty good today. States he is still having some pain. State he is having some lumbar pain but it is more dull. States that he is putting      PERTINENT HISTORY:  psoriatic arthritis, Vasculitis, low bone density, - has been on multiple drugs for autoimmune - CellCept most recent    PAIN:  Are you having pain? Yes NPRS scale: 1/10 Pain location:  mid to lower lumbar spine. Pain orientation: mid PAIN TYPE: aching Pain description: intermittent  Aggravating factors: no lumbar/firm support Relieving factors: massage, gamers chair, lumbar support   PRECAUTIONS: Other: possible osteopenia     FALLS:  Has patient fallen in last 6 months? 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 No spinal canal stenosis or neural foraminal narrowing. 2. 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   MR LUMBAR SPINE IMPRESSION   1. No spinal canal stenosis or neural foraminal narrowing. 2. 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 3. 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 Please correlate with imaging if any intervention is planned.         SCREENING FOR RED FLAGS: Bowel or bladder incontinence: No Spinal tumors: No Cauda equina syndrome: No     COGNITION:          Overall cognitive status: Within functional limits for tasks assessed                          MUSCLE LENGTH: assess next session Hamstrings: Right  deg; Left  deg Marcello Moores test: Right deg; Left  deg   POSTURE:  Reduced lumbar lordosis, sacral sitting, hitches at thoracolumbar and lumbosacral junctions   Observation: left hip more restricted than right         LUMBARAROM/PROM   A/PROM A/PROM  11/08/2021  Flexion 85% limited*  Extension 85% limited  Right lateral flexion 50% limited  Left lateral flexion 50% limited  Right rotation    Left rotation     (Blank rows = not tested) *Hitches at T11 SP pain with flexion         Lower Extremity Right 11/08/2021 Left 11/08/2021    A/PROM MMT A/PROM MMT  Hip Flexion   5   5  Hip Extension 15 (VE) 4* 10 (VE) 4*  Hip Abduction          Hip Adduction          Hip Internal rotation WNL   WNL    Hip External rotation WNL   WNL    Knee Flexion   4   4  Knee Extension   5   5  Ankle Dorsiflexion          Ankle Plantarflexion          Ankle Inversion          Ankle Eversion           (Blank rows = not tested)          *Compensates in lumbar/thoracic paraspinals           (VE)= visual estimation   LUMBAR SPECIAL TESTS:  Straight leg raise test: Negative, Slump  test: Negative, and Ely's Positive left       TODAY'S TREATMENT  11/12/21 Therapeutic Exercise:  Aerobic: Supine:LTR 2 minutes, lumbar roll supine 2 minutes, bridge x25 5" holds Prone:  Seated: hamstring stretch x3 30"  Standing:   Quadruped: cat/cow x20 10" holds each position - verbal and tactile cues Neuromuscular Re-education:hip hinge with forward reach 4  minutes - prior demo and  verbal and tactile cues  11/08/21 Therapeutic Exercise:           Aerobic: Supine: Prone: prone on elbows 2 minutes total, hip extension x5 bilat           Seated: "active posture" x2           Standing: Neuromuscular Re-education: Manual Therapy: Therapeutic Activity: Self Care: Trigger Point Dry Needling:  Modalities:        PATIENT EDUCATION:  Education details: on posture, anatomy and rationale for exercises Person educated: Patient Education method: Explanation, Demonstration, and Handouts Education comprehension: verbalized understanding       HOME EXERCISE PROGRAM: [email or web address]   ASSESSMENT:   CLINICAL IMPRESSION: Patient is a 41 y.o. male who was seen today for physical therapy evaluation and treatment for back pain. Objective impairments include decreased mobility, decreased ROM, decreased strength, postural dysfunction, and pain. These impairments are limiting patient from  sitting,walking (when aggravated) . Personal factors including 1 comorbidity: psoriatic arthritis, 1-2 comorbidities: Vasculitis, and 3+ comorbidities: multiple courses of immunosuppressive drugs  are also affecting patient's functional outcome. Patient will benefit from skilled PT to address above impairments and improve overall function.    Patient tolerated exercises well. Continued with lumbar mobility and strengthening. Added hip hinge which was difficult but patient tolerated well. Reduced pain noted end of session with mild soreness noted. Added all new exercises to HEP. Will continue with current POC.    REHAB POTENTIAL: Good     CLINICAL DECISION MAKING: Evolving/moderate complexity   EVALUATION COMPLEXITY: Moderate     GOALS: Goals reviewed with patient? Yes   SHORT TERM GOALS:   STG Name Target Date Goal status  1 Patient will be independent in self management strategies to improve quality of life and functional outcomes. Baseline:  12/06/2021  INITIAL  2 Patient will report at  least 50% improvement in overall symptoms and/or function to demonstrate improved functional mobility Baseline: 0% 12/06/2021  INITIAL  3 Patient will be able to demonstrate improved lumbar mobility by at least 25% Baseline: 12/06/2021  INITIAL  4   Baseline:      5   Baseline:      6   Baseline:      7   Baseline:        LONG TERM GOALS:    LTG Name Target Date Goal status  1 Patient will report at least 75% improvement in overall symptoms and/or function to demonstrate improved functional mobility Baseline:0% 01/03/2022  INITIAL  2 Patient will be able to sit in good upright posture on ischial tuberosities for at least 1 minute to demonstrate improved active sitting posture.  Baseline: 01/03/2022  INITIAL  3 Patient will be able to demonstrate at least 4+ MMT in hip extensors Baseline: 01/03/2022  INITIAL  4   Baseline:      5   Baseline:      6   Baseline:      7   Baseline:            PLAN: PT FREQUENCY: 2x/week   PT DURATION: 8  weeks   PLANNED INTERVENTIONS: Therapeutic exercises, Therapeutic activity, Neuro Muscular re-education, Balance training, Gait training, Patient/Family education, Joint mobilization, Dry Needling, Cryotherapy, Moist heat, and Manual therapy   PLAN FOR NEXT SESSION: lumbar mobility focus on lumbar lordosis(lay over towel), core strengthening/activation, glute strengthening, add right wrist stretches    8:54 AM, 11/13/21 Jerene Pitch, DPT Physical Therapy with [facility]  [phone] office

## 2021-11-13 ENCOUNTER — Ambulatory Visit (INDEPENDENT_AMBULATORY_CARE_PROVIDER_SITE_OTHER): Payer: 59 | Admitting: Physical Therapy

## 2021-11-13 ENCOUNTER — Encounter: Payer: Self-pay | Admitting: Physical Therapy

## 2021-11-13 ENCOUNTER — Other Ambulatory Visit: Payer: Self-pay

## 2021-11-13 DIAGNOSIS — M545 Low back pain, unspecified: Secondary | ICD-10-CM

## 2021-11-13 DIAGNOSIS — M549 Dorsalgia, unspecified: Secondary | ICD-10-CM

## 2021-11-15 ENCOUNTER — Encounter: Payer: Self-pay | Admitting: Physical Therapy

## 2021-11-15 ENCOUNTER — Ambulatory Visit (INDEPENDENT_AMBULATORY_CARE_PROVIDER_SITE_OTHER): Payer: 59 | Admitting: Physical Therapy

## 2021-11-15 ENCOUNTER — Other Ambulatory Visit: Payer: Self-pay

## 2021-11-15 DIAGNOSIS — M549 Dorsalgia, unspecified: Secondary | ICD-10-CM | POA: Diagnosis not present

## 2021-11-15 DIAGNOSIS — M545 Low back pain, unspecified: Secondary | ICD-10-CM | POA: Diagnosis not present

## 2021-11-15 NOTE — Therapy (Signed)
OUTPATIENT PHYSICAL THERAPY TREATMENT NOTE   Patient Name: Jesse Wong MRN: [id number] DOB:[birth year], 41 y.o., male Today's Date: 11/15/2021  PCP: Hali Marry, MD REFERRING PROVIDER: Hali Marry, *   PT End of Session - 11/15/21 0846     Visit Number 3    Number of Visits 16    Date for PT Re-Evaluation 01/03/22    Authorization Type [employer] employee    Progress Note Due on Visit 0    PT Start Time 0847    PT Stop Time 0925    PT Time Calculation (min) 38 min    Activity Tolerance Patient tolerated treatment well             Past Medical History:  Diagnosis Date   Allergy    Arthritis    Asthma    Chronic prostatitis    Myalgia    Pulmonary infiltrates    Sinusitis, chronic    Tonsillar abscess    Vasculitis ([place])    Past Surgical History:  Procedure Laterality Date   BRONCHOSCOPY  2011   TBBX ----inflammation   IR FLUORO GUIDE CV LINE RIGHT  01/31/2017   IR FLUORO GUIDE CV MIDLINE PICC RIGHT  01/30/2017   IR US GUIDE VASC ACCESS RIGHT  01/30/2017   peritonsilar abscess     TYMPANOPLASTY     VIDEO BRONCHOSCOPY Bilateral 06/25/2018   Procedure: VIDEO BRONCHOSCOPY WITHOUT FLUORO;  Surgeon: [name], MD;  Location: [facility];  Service: Cardiopulmonary;  Laterality: Bilateral;   Patient Active Problem List   Diagnosis Date Noted   Coccygeal pain 06/16/2020   Nose injury, initial encounter 06/16/2020   Atherosclerosis of aorta ([place]) 04/12/2019   Pulmonary infiltrates    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[place]) 03/03/2018   Medication monitoring encounter 03/03/2018   Pulmonary nodule seen on imaging study 01/15/2018   Microscopic polyangiitis ([place]) 03/14/2016   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   Dyshidrotic eczema 04/08/2014   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   MYALGIA 06/28/2010   GERD 06/27/2010   Nonspecific (abnormal) findings on radiological and other examination of body structure 05/29/2010   CT, CHEST, ABNORMAL  05/29/2010   Asthma with bronchitis 04/09/2010   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    PCP: Hali Marry, MD   REFERRING PROVIDER: Hali Marry, *   REFERRING DIAG: M54.50 (ICD-10-CM) - Acute midline low back pain, unspecified whether sciatica present   THERAPY DIAG:  Acute midline low back pain, unspecified whether sciatica present - Plan: PT plan of care cert/re-cert   Mid back pain - Plan: PT plan of care cert/re-cert   ONSET DATE: December 2022   SUBJECTIVE:  SUBJECTIVE STATEMENT: States that yesterday he was having slightly more pain yesterday but he had a weird day at different offices.      PERTINENT HISTORY:  psoriatic arthritis, Vasculitis, low bone density, - has been on multiple drugs for autoimmune - CellCept most recent    PAIN:  Are you having pain? Yes NPRS scale: 2/10 Pain location:  mid to lower lumbar spine. Pain orientation: mid PAIN TYPE: aching Pain description: intermittent  Aggravating factors: no lumbar/firm support Relieving factors: massage, gamers chair, lumbar support   PRECAUTIONS: Other: possible osteopenia     FALLS:  Has patient fallen in last 6 months? 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 No spinal canal stenosis or neural foraminal narrowing. 2. 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   MR LUMBAR SPINE IMPRESSION   1. No spinal canal stenosis or neural foraminal narrowing. 2. 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 3. Note is made of transitional  anatomy, with lumbarization of S1. Please correlate with imaging if any intervention is planned.         SCREENING FOR RED FLAGS: Bowel or bladder incontinence: No Spinal tumors: No Cauda equina syndrome: No     COGNITION:          Overall cognitive status: Within functional limits for tasks assessed                          MUSCLE LENGTH: assess next session Hamstrings: Right  deg; Left  deg Marcello Moores test: Right deg; Left  deg   POSTURE:  Reduced lumbar lordosis, sacral sitting, hitches at thoracolumbar and lumbosacral junctions   Observation: left hip more restricted than right         LUMBARAROM/PROM   A/PROM A/PROM  11/08/2021  Flexion 85% limited*  Extension 85% limited  Right lateral flexion 50% limited  Left lateral flexion 50% limited  Right rotation    Left rotation     (Blank rows = not tested) *Hitches at T11 SP pain with flexion         Lower Extremity Right 11/08/2021 Left 11/08/2021    A/PROM MMT A/PROM MMT  Hip Flexion   5   5  Hip Extension 15 (VE) 4* 10 (VE) 4*  Hip Abduction          Hip Adduction          Hip Internal rotation WNL   WNL    Hip External rotation WNL   WNL    Knee Flexion   4   4  Knee Extension   5   5  Ankle Dorsiflexion          Ankle Plantarflexion          Ankle Inversion          Ankle Eversion           (Blank rows = not tested)          *Compensates in lumbar/thoracic paraspinals           (VE)= visual estimation   LUMBAR SPECIAL TESTS:  Straight leg raise test: Negative, Slump test: Negative, and Ely's Positive left  TODAY'S TREATMENT  11/15/21 Standing: shoulder extension dowel 2x15 5", thoracic rotation with stick 5 minutes standing then seated with tactile cues 5 minutes Neuro: posterior pelvic tilt and chin tuck at wall - 12 minutes   11/12/21 Therapeutic Exercise:  Aerobic: Supine:LTR 2 minutes, lumbar roll supine 2 minutes, bridge x25 5" holds Prone:  Seated: hamstring stretch x3  30"  Standing:   Quadruped: cat/cow x20 10" holds each position - verbal and tactile cues Neuromuscular Re-education:hip hinge with forward reach 4  minutes - prior demo and verbal and tactile cues  11/08/21 Therapeutic Exercise:           Aerobic: Supine: Prone: prone on elbows 2 minutes total, hip extension x5 bilat           Seated: "active posture" x2           Standing: Neuromuscular Re-education: Manual Therapy: Therapeutic Activity: Self Care: Trigger Point Dry Needling:  Modalities:        PATIENT EDUCATION:  Education details: on posture, anatomy and rationale for exercises on lengthening and decompressing postrieor spine Person educated: Patient Education method: Explanation, Demonstration, and Handouts Education comprehension: verbalized understanding       HOME EXERCISE PROGRAM: [email or web address]   ASSESSMENT:   CLINICAL IMPRESSION: Patient is a 41 y.o. male who was seen today for physical therapy evaluation and treatment for back pain. Objective impairments include decreased mobility, decreased ROM, decreased strength, postural dysfunction, and pain. These impairments are limiting patient from  sitting,walking (when aggravated) . Personal factors including 1 comorbidity: psoriatic arthritis, 1-2 comorbidities: Vasculitis, and 3+ comorbidities: multiple courses of immunosuppressive drugs  are also affecting patient's functional outcome. Patient will benefit from skilled PT to address above impairments and improve overall function.    Continued with progressing exercises. Patient with desire to incorporate exercises throughout the day, added standing exercises to program for this reason. Tolerated all exercises well and used pictures and prior demonstration for explanation. Will continue with current POC.  REHAB POTENTIAL: Good     CLINICAL DECISION MAKING: Evolving/moderate complexity   EVALUATION COMPLEXITY: Moderate     GOALS: Goals reviewed with patient? Yes    SHORT TERM GOALS:   STG Name Target Date Goal status  1 Patient will be independent in self management strategies to improve quality of life and functional outcomes. Baseline:  12/06/2021  INITIAL  2 Patient will report at least 50% improvement in overall symptoms and/or function to demonstrate improved functional mobility Baseline: 0% 12/06/2021  INITIAL  3 Patient will be able to demonstrate improved lumbar mobility by at least 25% Baseline: 12/06/2021  INITIAL  4   Baseline:      5   Baseline:      6   Baseline:      7   Baseline:        LONG TERM GOALS:    LTG Name Target Date Goal status  1 Patient will report at least 75% improvement in overall symptoms and/or function to demonstrate improved functional mobility Baseline:0% 01/03/2022  INITIAL  2 Patient will be able to sit in good upright posture on ischial tuberosities for at least 1 minute to demonstrate improved active sitting posture.  Baseline: 01/03/2022  INITIAL  3 Patient will be able to demonstrate at least 4+ MMT in hip extensors Baseline: 01/03/2022  INITIAL  4   Baseline:      5   Baseline:      6   Baseline:  7   Baseline:            PLAN: PT FREQUENCY: 2x/week   PT DURATION: 8 weeks   PLANNED INTERVENTIONS: Therapeutic exercises, Therapeutic activity, Neuro Muscular re-education, Balance training, Gait training, Patient/Family education, Joint mobilization, Dry Needling, Cryotherapy, Moist heat, and Manual therapy   PLAN FOR NEXT SESSION: lumbar mobility focus on lumbar lordosis(lay over towel), core strengthening/activation, glute strengthening, add right wrist stretches    9:19 AM, 11/15/21 Jerene Pitch, DPT Physical Therapy with [facility]  [phone] office

## 2021-11-20 ENCOUNTER — Encounter: Payer: Self-pay | Admitting: Physical Therapy

## 2021-11-20 ENCOUNTER — Other Ambulatory Visit: Payer: Self-pay

## 2021-11-20 ENCOUNTER — Ambulatory Visit (INDEPENDENT_AMBULATORY_CARE_PROVIDER_SITE_OTHER): Payer: 59 | Admitting: Physical Therapy

## 2021-11-20 DIAGNOSIS — M549 Dorsalgia, unspecified: Secondary | ICD-10-CM

## 2021-11-20 DIAGNOSIS — M545 Low back pain, unspecified: Secondary | ICD-10-CM

## 2021-11-20 NOTE — Therapy (Signed)
OUTPATIENT PHYSICAL THERAPY TREATMENT NOTE   Patient Name: Jesse Wong MRN: [id number] DOB:1981/09/09, 41 y.o., male Today's Date: 11/20/2021  PCP: Hali Marry, MD REFERRING PROVIDER: Hali Marry, *   PT End of Session - 11/20/21 0800     Visit Number 4    Number of Visits 16    Date for PT Re-Evaluation 01/03/22    Authorization Type [place] employee    Progress Note Due on Visit 0    PT Start Time 0800    PT Stop Time 0840    PT Time Calculation (min) 40 min    Activity Tolerance Patient tolerated treatment well             Past Medical History:  Diagnosis Date   Allergy    Arthritis    Asthma    Chronic prostatitis    Myalgia    Pulmonary infiltrates    Sinusitis, chronic    Tonsillar abscess    Vasculitis ([place])    Past Surgical History:  Procedure Laterality Date   BRONCHOSCOPY  2011   TBBX ----inflammation   IR FLUORO GUIDE CV LINE RIGHT  01/31/2017   IR FLUORO GUIDE CV MIDLINE PICC RIGHT  01/30/2017   IR US GUIDE VASC ACCESS RIGHT  01/30/2017   peritonsilar abscess     TYMPANOPLASTY     VIDEO BRONCHOSCOPY Bilateral 06/25/2018   Procedure: VIDEO BRONCHOSCOPY WITHOUT FLUORO;  Surgeon: [name], MD;  Location: [facility];  Service: Cardiopulmonary;  Laterality: Bilateral;   Patient Active Problem List   Diagnosis Date Noted   Coccygeal pain 06/16/2020   Nose injury, initial encounter 06/16/2020   Atherosclerosis of aorta ([place]) 04/12/2019   Pulmonary infiltrates    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[place]) 03/03/2018   Medication monitoring encounter 03/03/2018   Pulmonary nodule seen on imaging study 01/15/2018   Microscopic polyangiitis ([place]) 03/14/2016   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   Dyshidrotic eczema 04/08/2014   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   MYALGIA 06/28/2010   GERD 06/27/2010   Nonspecific (abnormal) findings on radiological and other examination of body structure 05/29/2010   CT, CHEST, ABNORMAL  05/29/2010   Asthma with bronchitis 04/09/2010   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    PCP: Hali Marry, MD   REFERRING PROVIDER: Hali Marry, *   REFERRING DIAG: M54.50 (ICD-10-CM) - Acute midline low back pain, unspecified whether sciatica present   THERAPY DIAG:  Acute midline low back pain, unspecified whether sciatica present - Plan: PT plan of care cert/re-cert   Mid back pain - Plan: PT plan of care cert/re-cert   ONSET DATE: December 2022   SUBJECTIVE:  SUBJECTIVE STATEMENT: States that he feels good and some days minimal pain.      PERTINENT HISTORY:  psoriatic arthritis, Vasculitis, low bone density, - has been on multiple drugs for autoimmune - CellCept most recent    PAIN:  Are you having pain? Yes NPRS scale: 2/10 Pain location:  mid to lower lumbar spine. Pain orientation: mid PAIN TYPE: aching Pain description: intermittent  Aggravating factors: no lumbar/firm support Relieving factors: massage, gamers chair, lumbar support   PRECAUTIONS: Other: possible osteopenia     FALLS:  Has patient fallen in last 6 months? 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 No spinal canal stenosis or neural foraminal narrowing. 2. 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   MR LUMBAR SPINE IMPRESSION   1. No spinal canal stenosis or neural foraminal narrowing. 2. 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 3. 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 Please correlate with  imaging if any intervention is planned.         SCREENING FOR RED FLAGS: Bowel or bladder incontinence: No Spinal tumors: No Cauda equina syndrome: No     COGNITION:          Overall cognitive status: Within functional limits for tasks assessed                          MUSCLE LENGTH: assess next session Hamstrings: Right  deg; Left  deg Marcello Moores test: Right deg; Left  deg   POSTURE:  Reduced lumbar lordosis, sacral sitting, hitches at thoracolumbar and lumbosacral junctions   Observation: left hip more restricted than right         LUMBARAROM/PROM   A/PROM A/PROM  11/08/2021  Flexion 85% limited*  Extension 85% limited  Right lateral flexion 50% limited  Left lateral flexion 50% limited  Right rotation    Left rotation     (Blank rows = not tested) *Hitches at T11 SP pain with flexion         Lower Extremity Right 11/08/2021 Left 11/08/2021    A/PROM MMT A/PROM MMT  Hip Flexion   5   5  Hip Extension 15 (VE) 4* 10 (VE) 4*  Hip Abduction          Hip Adduction          Hip Internal rotation WNL   WNL    Hip External rotation WNL   WNL    Knee Flexion   4   4  Knee Extension   5   5  Ankle Dorsiflexion          Ankle Plantarflexion          Ankle Inversion          Ankle Eversion           (Blank rows = not tested)          *Compensates in lumbar/thoracic paraspinals           (VE)= visual estimation   LUMBAR SPECIAL TESTS:  Straight leg raise test: Negative, Slump test: Negative, and Ely's Positive left       TODAY'S TREATMENT  11/20/21 Supine:: TRA activation lower rib  depression and approximation 12 minutes total - tactile and verbal cues, TRA with bridge -2x5 5" holds   11/15/21 Standing: shoulder extension dowel 2x15 5", thoracic rotation with stick 5 minutes standing then seated with tactile cues 5 minutes Neuro: posterior pelvic tilt and chin tuck at wall - 15 minutes   11/12/21 Therapeutic Exercise:  Aerobic: Supine:LTR 2 minutes, lumbar  roll supine 2 minutes, bridge x25 5" holds Prone:  Seated: hamstring stretch x3 30"  Standing:   Quadruped: cat/cow x20 10" holds each position - verbal and tactile cues Neuromuscular Re-education:hip hinge with forward reach 4  minutes - prior demo and verbal and tactile cues  11/08/21 Therapeutic Exercise:           Aerobic: Supine: Prone: prone on elbows 2 minutes total, hip extension x5 bilat           Seated: "active posture" x2           Standing:    PATIENT EDUCATION:  Education details: reviewed HEP - entirely with verbal and tactile cues Person educated: Patient Education method: Explanation, Demonstration, and Handouts Education comprehension: verbalized understanding       HOME EXERCISE PROGRAM: WWGWDECZ Laying on your back with your knees bent and hands on lower ribs - breath out as if you are blowing out 100 birthday candles on one breath - maintain abdominal contraction then relax and repeat- next try to maintain contraction for a couple of breaths while you sip in air and maintain the rib depression/approximation - practice 5-10 minutes  Add in chin tuck with breathing exercises as tolerated Can reach and press elbows up towards ceiling   ASSESSMENT:   CLINICAL IMPRESSION: Patient is a 41 y.o. male who was seen today for physical therapy evaluation and treatment for back pain. Objective impairments include decreased mobility, decreased ROM, decreased strength, postural dysfunction, and pain. These impairments are limiting patient from  sitting,walking (when aggravated) . Personal factors including 1 comorbidity: psoriatic arthritis, 1-2 comorbidities: Vasculitis, and 3+ comorbidities: multiple courses of immunosuppressive drugs  are also affecting patient's functional outcome. Patient will benefit from skilled PT to address above impairments and improve overall function.    Added TRA activation with lower rib depression. Tolerated well and required initial cues for  form but able to transition to occasional cues. Reviewed all exercises from HEP and cued for form. Will continue with current POC.  REHAB POTENTIAL: Good     CLINICAL DECISION MAKING: Evolving/moderate complexity   EVALUATION COMPLEXITY: Moderate     GOALS: Goals reviewed with patient? Yes   SHORT TERM GOALS:   STG Name Target Date Goal status  1 Patient will be independent in self management strategies to improve quality of life and functional outcomes. Baseline:  12/06/2021  INITIAL  2 Patient will report at least 50% improvement in overall symptoms and/or function to demonstrate improved functional mobility Baseline: 0% 12/06/2021  INITIAL  3 Patient will be able to demonstrate improved lumbar mobility by at least 25% Baseline: 12/06/2021  INITIAL  4   Baseline:      5   Baseline:      6   Baseline:      7   Baseline:        LONG TERM GOALS:    LTG Name Target Date Goal status  1 Patient will report at least 75% improvement in overall symptoms and/or function to demonstrate improved functional mobility Baseline:0% 01/03/2022  INITIAL  2 Patient will be able to sit  in good upright posture on ischial tuberosities for at least 1 minute to demonstrate improved active sitting posture.  Baseline: 01/03/2022  INITIAL  3 Patient will be able to demonstrate at least 4+ MMT in hip extensors Baseline: 01/03/2022  INITIAL  4   Baseline:      5   Baseline:      6   Baseline:      7   Baseline:            PLAN: PT FREQUENCY: 2x/week   PT DURATION: 8 weeks   PLANNED INTERVENTIONS: Therapeutic exercises, Therapeutic activity, Neuro Muscular re-education, Balance training, Gait training, Patient/Family education, Joint mobilization, Dry Needling, Cryotherapy, Moist heat, and Manual therapy   PLAN FOR NEXT SESSION: paloff press, lat pull down, lumbar mobility focus on lumbar lordosis(lay over towel), core strengthening/activation, glute strengthening, add right wrist stretches     8:45 AM, 11/20/21 Jerene Pitch, DPT Physical Therapy with [facility]  [phone] office

## 2021-11-22 ENCOUNTER — Other Ambulatory Visit: Payer: Self-pay

## 2021-11-22 ENCOUNTER — Ambulatory Visit (INDEPENDENT_AMBULATORY_CARE_PROVIDER_SITE_OTHER): Payer: 59 | Admitting: Physical Therapy

## 2021-11-22 ENCOUNTER — Encounter: Payer: Self-pay | Admitting: Physical Therapy

## 2021-11-22 DIAGNOSIS — M549 Dorsalgia, unspecified: Secondary | ICD-10-CM | POA: Diagnosis not present

## 2021-11-22 DIAGNOSIS — M545 Low back pain, unspecified: Secondary | ICD-10-CM

## 2021-11-22 NOTE — Therapy (Signed)
OUTPATIENT PHYSICAL THERAPY TREATMENT NOTE   Patient Name: Jesse Wong MRN: [id number] DOB:10-Aug-1981, 41 y.o., male Today's Date: 11/22/2021  PCP: Hali Marry, MD REFERRING PROVIDER: Hali Marry, *   PT End of Session - 11/22/21 0805     Visit Number 5    Number of Visits 16    Date for PT Re-Evaluation 01/03/22    Authorization Type [employer] employee    Progress Note Due on Visit 0    PT Start Time 0804    PT Stop Time 0846    PT Time Calculation (min) 42 min    Activity Tolerance Patient tolerated treatment well             Past Medical History:  Diagnosis Date   Allergy    Arthritis    Asthma    Chronic prostatitis    Myalgia    Pulmonary infiltrates    Sinusitis, chronic    Tonsillar abscess    Vasculitis ([place])    Past Surgical History:  Procedure Laterality Date   BRONCHOSCOPY  2011   TBBX ----inflammation   IR FLUORO GUIDE CV LINE RIGHT  01/31/2017   IR FLUORO GUIDE CV MIDLINE PICC RIGHT  01/30/2017   IR US GUIDE VASC ACCESS RIGHT  01/30/2017   peritonsilar abscess     TYMPANOPLASTY     VIDEO BRONCHOSCOPY Bilateral 06/25/2018   Procedure: VIDEO BRONCHOSCOPY WITHOUT FLUORO;  Surgeon: [name], MD;  Location: [facility];  Service: Cardiopulmonary;  Laterality: Bilateral;   Patient Active Problem List   Diagnosis Date Noted   Coccygeal pain 06/16/2020   Nose injury, initial encounter 06/16/2020   Atherosclerosis of aorta ([place]) 04/12/2019   Pulmonary infiltrates    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[place]) 03/03/2018   Medication monitoring encounter 03/03/2018   Pulmonary nodule seen on imaging study 01/15/2018   Microscopic polyangiitis ([place]) 03/14/2016   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   Dyshidrotic eczema 04/08/2014   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   MYALGIA 06/28/2010   GERD 06/27/2010   Nonspecific (abnormal) findings on radiological and other examination of body structure 05/29/2010   CT, CHEST, ABNORMAL  05/29/2010   Asthma with bronchitis 04/09/2010   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    PCP: Hali Marry, MD   REFERRING PROVIDER: Hali Marry, *   REFERRING DIAG: M54.50 (ICD-10-CM) - Acute midline low back pain, unspecified whether sciatica present   THERAPY DIAG:  Acute midline low back pain, unspecified whether sciatica present - Plan: PT plan of care cert/re-cert   Mid back pain - Plan: PT plan of care cert/re-cert   ONSET DATE: December 2022   SUBJECTIVE:  SUBJECTIVE STATEMENT: States that he feels good and some days minimal pain.      PERTINENT HISTORY:  psoriatic arthritis, Vasculitis, low bone density, - has been on multiple drugs for autoimmune - CellCept most recent    PAIN:  Are you having pain? Yes NPRS scale: 2/10 Pain location:  mid to lower lumbar spine. Pain orientation: mid PAIN TYPE: aching Pain description: intermittent  Aggravating factors: no lumbar/firm support Relieving factors: massage, gamers chair, lumbar support   PRECAUTIONS: Other: possible osteopenia     FALLS:  Has patient fallen in last 6 months? 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 No spinal canal stenosis or neural foraminal narrowing. 2. 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   MR LUMBAR SPINE IMPRESSION   1. No spinal canal stenosis or neural foraminal narrowing. 2. 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 3. 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 Please correlate with  imaging if any intervention is planned.         SCREENING FOR RED FLAGS: Bowel or bladder incontinence: No Spinal tumors: No Cauda equina syndrome: No     COGNITION:          Overall cognitive status: Within functional limits for tasks assessed                          MUSCLE LENGTH: assess next session Hamstrings: Right  deg; Left  deg Marcello Moores test: Right deg; Left  deg   POSTURE:  Reduced lumbar lordosis, sacral sitting, hitches at thoracolumbar and lumbosacral junctions   Observation: left hip more restricted than right         LUMBARAROM/PROM   A/PROM A/PROM  11/08/2021  Flexion 85% limited*  Extension 85% limited  Right lateral flexion 50% limited  Left lateral flexion 50% limited  Right rotation    Left rotation     (Blank rows = not tested) *Hitches at T11 SP pain with flexion         Lower Extremity Right 11/08/2021 Left 11/08/2021    A/PROM MMT A/PROM MMT  Hip Flexion   5   5  Hip Extension 15 (VE) 4* 10 (VE) 4*  Hip Abduction          Hip Adduction          Hip Internal rotation WNL   WNL    Hip External rotation WNL   WNL    Knee Flexion   4   4  Knee Extension   5   5  Ankle Dorsiflexion          Ankle Plantarflexion          Ankle Inversion          Ankle Eversion           (Blank rows = not tested)          *Compensates in lumbar/thoracic paraspinals           (VE)= visual estimation   LUMBAR SPECIAL TESTS:  Straight leg raise test: Negative, Slump test: Negative, and Ely's Positive left       TODAY'S TREATMENT  11/22/21 Standing: pallof press - 2x12  bilat green theraband, shoulder, shoulder pull to chest green theraband 2x12 5" holds, posture holds at wall 4 minutes, chest stretch x4 20" holds   11/20/21 Supine:: TRA activation lower rib depression and approximation 12 minutes total - tactile and verbal cues, TRA with bridge -2x5 5" holds   11/15/21 Standing: shoulder extension dowel 2x15 5", thoracic rotation with stick 5 minutes  standing then seated with tactile cues 5 minutes Neuro: posterior pelvic tilt and chin tuck at wall - 15 minutes   11/12/21 Therapeutic Exercise:  Aerobic: Supine:LTR 2 minutes, lumbar roll supine 2 minutes, bridge x25 5" holds Prone:  Seated: hamstring stretch x3 30"  Standing:   Quadruped: cat/cow x20 10" holds each position - verbal and tactile cues Neuromuscular Re-education:hip hinge with forward reach 4  minutes - prior demo and verbal and tactile cues     PATIENT EDUCATION:  Education details: on which exercises to focus on if increased pain, on anti - inflammatory foods, on sleeping positions  Person educated: Patient Education method: Consulting civil engineer, Demonstration, and Handouts Education comprehension: verbalized understanding       HOME EXERCISE PROGRAM: [facility] Laying on your back with your knees bent and hands on lower ribs - breath out as if you are blowing out 100 birthday candles on one breath - maintain abdominal contraction then relax and repeat- next try to maintain contraction for a couple of breaths while you sip in air and maintain the rib depression/approximation - practice 5-10 minutes  Add in chin tuck with breathing exercises as tolerated Can reach and press elbows up towards ceiling   ASSESSMENT:   CLINICAL IMPRESSION: Patient is a 41 y.o. male who was seen today for physical therapy evaluation and treatment for back pain. Objective impairments include decreased mobility, decreased ROM, decreased strength, postural dysfunction, and pain. These impairments are limiting patient from  sitting,walking (when aggravated) . Personal factors including 1 comorbidity: psoriatic arthritis, 1-2 comorbidities: Vasculitis, and 3+ comorbidities: multiple courses of immunosuppressive drugs  are also affecting patient's functional outcome. Patient will benefit from skilled PT to address above impairments and improve overall function.   11/22/2021 Patient tolerated session  well. Continued with mobility and strengthening exercises and provided patient  with green theraband for HEP adherence. Discussed upcoming return to medication that causes nausea and back pain. Focused on exercises to focus on if back pain returns, as well as positional strategies to alleviate symptoms if they return. Will follow p with patient next session about pain and return to medication.   REHAB POTENTIAL: Good     CLINICAL DECISION MAKING: Evolving/moderate complexity   EVALUATION COMPLEXITY: Moderate     GOALS: Goals reviewed with patient? Yes   SHORT TERM GOALS:   STG Name Target Date Goal status  1 Patient will be independent in self management strategies to improve quality of life and functional outcomes. Baseline:  12/06/2021  INITIAL  2 Patient will report at least 50% improvement in overall symptoms and/or function to demonstrate improved functional mobility Baseline: 0% 12/06/2021  INITIAL  3 Patient will be able to demonstrate improved lumbar mobility by at least 25% Baseline: 12/06/2021  INITIAL  4   Baseline:      5   Baseline:      6   Baseline:      7   Baseline:        LONG TERM GOALS:    LTG Name Target Date Goal status  1 Patient will report at least 75% improvement in overall  symptoms and/or function to demonstrate improved functional mobility Baseline:0% 01/03/2022  INITIAL  2 Patient will be able to sit in good upright posture on ischial tuberosities for at least 1 minute to demonstrate improved active sitting posture.  Baseline: 01/03/2022  INITIAL  3 Patient will be able to demonstrate at least 4+ MMT in hip extensors Baseline: 01/03/2022  INITIAL  4   Baseline:      5   Baseline:      6   Baseline:      7   Baseline:            PLAN: PT FREQUENCY: 2x/week   PT DURATION: 8 weeks   PLANNED INTERVENTIONS: Therapeutic exercises, Therapeutic activity, Neuro Muscular re-education, Balance training, Gait training, Patient/Family education, Joint  mobilization, Dry Needling, Cryotherapy, Moist heat, and Manual therapy   PLAN FOR NEXT SESSION: lat pull down, lumbar mobility focus on lumbar lordosis(lay over towel), core strengthening/activation, glute strengthening, add right wrist stretches    9:06 AM, 11/22/21 Jerene Pitch, DPT Physical Therapy with [facility]  [phone] office

## 2021-11-26 ENCOUNTER — Ambulatory Visit (INDEPENDENT_AMBULATORY_CARE_PROVIDER_SITE_OTHER): Payer: 59

## 2021-11-26 ENCOUNTER — Other Ambulatory Visit: Payer: Self-pay

## 2021-11-26 DIAGNOSIS — J8410 Pulmonary fibrosis, unspecified: Secondary | ICD-10-CM | POA: Diagnosis not present

## 2021-11-26 DIAGNOSIS — J42 Unspecified chronic bronchitis: Secondary | ICD-10-CM

## 2021-11-26 DIAGNOSIS — J9859 Other diseases of mediastinum, not elsewhere classified: Secondary | ICD-10-CM | POA: Diagnosis not present

## 2021-11-26 DIAGNOSIS — I2510 Atherosclerotic heart disease of native coronary artery without angina pectoris: Secondary | ICD-10-CM | POA: Diagnosis not present

## 2021-11-26 DIAGNOSIS — J479 Bronchiectasis, uncomplicated: Secondary | ICD-10-CM | POA: Diagnosis not present

## 2021-11-27 ENCOUNTER — Ambulatory Visit (INDEPENDENT_AMBULATORY_CARE_PROVIDER_SITE_OTHER): Payer: 59 | Admitting: Physical Therapy

## 2021-11-27 ENCOUNTER — Encounter: Payer: Self-pay | Admitting: Physical Therapy

## 2021-11-27 DIAGNOSIS — M549 Dorsalgia, unspecified: Secondary | ICD-10-CM

## 2021-11-27 DIAGNOSIS — M545 Low back pain, unspecified: Secondary | ICD-10-CM | POA: Diagnosis not present

## 2021-11-27 NOTE — Therapy (Signed)
OUTPATIENT PHYSICAL THERAPY TREATMENT NOTE   Patient Name: Jesse Wong MRN: [id number] DOB:[birth year], 41 y.o., male Today's Date: 11/27/2021  PCP: Hali Marry, MD REFERRING PROVIDER: Hali Marry, *   PT End of Session - 11/27/21 0805     Visit Number 6    Number of Visits 16    Date for PT Re-Evaluation 01/03/22    Authorization Type [employer] employee    Progress Note Due on Visit 10    PT Start Time 0804    PT Stop Time 0842    PT Time Calculation (min) 38 min    Activity Tolerance Patient tolerated treatment well             Past Medical History:  Diagnosis Date   Allergy    Arthritis    Asthma    Chronic prostatitis    Myalgia    Pulmonary infiltrates    Sinusitis, chronic    Tonsillar abscess    Vasculitis ([place])    Past Surgical History:  Procedure Laterality Date   BRONCHOSCOPY  2011   TBBX ----inflammation   IR FLUORO GUIDE CV LINE RIGHT  01/31/2017   IR FLUORO GUIDE CV MIDLINE PICC RIGHT  01/30/2017   IR US GUIDE VASC ACCESS RIGHT  01/30/2017   peritonsilar abscess     TYMPANOPLASTY     VIDEO BRONCHOSCOPY Bilateral 06/25/2018   Procedure: VIDEO BRONCHOSCOPY WITHOUT FLUORO;  Surgeon: [name], MD;  Location: [facility];  Service: Cardiopulmonary;  Laterality: Bilateral;   Patient Active Problem List   Diagnosis Date Noted   Coccygeal pain 06/16/2020   Nose injury, initial encounter 06/16/2020   Atherosclerosis of aorta ([place]) 04/12/2019   Pulmonary infiltrates    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[place]) 03/03/2018   Medication monitoring encounter 03/03/2018   Pulmonary nodule seen on imaging study 01/15/2018   Microscopic polyangiitis ([place]) 03/14/2016   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   Dyshidrotic eczema 04/08/2014   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   MYALGIA 06/28/2010   GERD 06/27/2010   Nonspecific (abnormal) findings on radiological and other examination of body structure 05/29/2010   CT, CHEST, ABNORMAL  05/29/2010   Asthma with bronchitis 04/09/2010   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    PCP: Hali Marry, MD   REFERRING PROVIDER: Hali Marry, *   REFERRING DIAG: M54.50 (ICD-10-CM) - Acute midline low back pain, unspecified whether sciatica present   THERAPY DIAG:  Acute midline low back pain, unspecified whether sciatica present - Plan: PT plan of care cert/re-cert   Mid back pain - Plan: PT plan of care cert/re-cert   ONSET DATE: December 2022   SUBJECTIVE:  SUBJECTIVE STATEMENT: States that his tailbone has been sore with sitting on soft surfaces. States that he has sitting all weekend as he had a fever from the COVID vaccine.     PERTINENT HISTORY:  psoriatic arthritis, Vasculitis, low bone density, - has been on multiple drugs for autoimmune - CellCept most recent    PAIN:  Are you having pain? Yes NPRS scale: 1/10 Pain location:  mid to lower lumbar spine. Pain orientation: mid PAIN TYPE: aching Pain description: intermittent  Aggravating factors: no lumbar/firm support Relieving factors: massage, gamers chair, lumbar support   PRECAUTIONS: Other: possible osteopenia     FALLS:  Has patient fallen in last 6 months? 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 No spinal canal stenosis or neural foraminal narrowing. 2. 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   MR LUMBAR SPINE IMPRESSION   1. No spinal canal stenosis or neural foraminal narrowing. 2. 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 3. 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 Please correlate with imaging if any intervention is planned.         SCREENING FOR RED FLAGS: Bowel or bladder incontinence: No Spinal tumors: No Cauda equina syndrome: No     COGNITION:          Overall cognitive status: Within functional limits for tasks assessed                          MUSCLE LENGTH: assess next session Hamstrings: Right  deg; Left  deg Marcello Moores test: Right deg; Left  deg   POSTURE:  Reduced lumbar lordosis, sacral sitting, hitches at thoracolumbar and lumbosacral junctions   Observation: left hip more restricted than right         LUMBARAROM/PROM   A/PROM A/PROM  11/08/2021  Flexion 85% limited*  Extension 85% limited  Right lateral flexion 50% limited  Left lateral flexion 50% limited  Right rotation    Left rotation     (Blank rows = not tested) *Hitches at T11 SP pain with flexion         Lower Extremity Right 11/08/2021 Left 11/08/2021    A/PROM MMT A/PROM MMT  Hip Flexion   5   5  Hip Extension 15 (VE) 4* 10 (VE) 4*  Hip Abduction          Hip Adduction          Hip Internal rotation WNL   WNL    Hip External rotation WNL   WNL    Knee Flexion   4   4  Knee Extension   5   5  Ankle Dorsiflexion          Ankle Plantarflexion          Ankle Inversion          Ankle Eversion           (Blank rows = not tested)          *Compensates in lumbar/thoracic paraspinals           (VE)= visual estimation   LUMBAR SPECIAL TESTS:  Straight leg raise test: Negative, Slump test: Negative, and Ely's  Positive left       TODAY'S TREATMENT  11/27/2021 TE Supine: LTR x5 bilat Quadruped: cat/cow x10,  Neuro:Quadruped lumbar isolated movements anterior/posterior tilts and lateral tilts with video for feedback - 15 minutes, alternate arm raises x5 bilat  11/22/21 Standing: pallof press - 2x12 bilat green theraband, shoulder, shoulder pull to chest green theraband 2x12 5"  holds, posture holds at wall 4 minutes, chest stretch x4 20" holds   11/20/21 Supine:: TRA activation lower rib depression and approximation 12 minutes total - tactile and verbal cues, TRA with bridge -2x5 5" holds   11/15/21 Standing: shoulder extension dowel 2x15 5", thoracic rotation with stick 5 minutes standing then seated with tactile cues 5 minutes Neuro: posterior pelvic tilt and chin tuck at wall - 15 minutes   11/12/21 Therapeutic Exercise:  Aerobic: Supine:LTR 2 minutes, lumbar roll supine 2 minutes, bridge x25 5" holds Prone:  Seated: hamstring stretch x3 30"  Standing:   Quadruped: cat/cow x20 10" holds each position - verbal and tactile cues Neuromuscular Re-education:hip hinge with forward reach 4  minutes - prior demo and verbal and tactile cues     PATIENT EDUCATION:  Education details: o on postures with sitting on couch to alleviate symptoms pf coccyx pain.  Person educated: Patient Education method: Explanation, Demonstration, and Handouts Education comprehension: verbalized understanding       HOME EXERCISE PROGRAM: [id number] Laying on your back with your knees bent and hands on lower ribs - breath out as if you are blowing out 100 birthday candles on one breath - maintain abdominal contraction then relax and repeat- next try to maintain contraction for a couple of breaths while you sip in air and maintain the rib depression/approximation - practice 5-10 minutes  Add in chin tuck with breathing exercises as tolerated Can reach and press elbows up towards ceiling   ASSESSMENT:   CLINICAL IMPRESSION: Patient is a 40 y.o. male who was seen today for physical therapy evaluation and treatment for back pain. Objective impairments include decreased mobility, decreased ROM, decreased strength, postural dysfunction, and pain. These impairments are limiting patient from  sitting,walking (when aggravated) . Personal factors including 1 comorbidity: psoriatic arthritis,  1-2 comorbidities: Vasculitis, and 3+ comorbidities: multiple courses of immunosuppressive drugs  are also affecting patient's functional outcome. Patient will benefit from skilled PT to address above impairments and improve overall function.   11/27/2021 Session focused on exercise progressing in quadruped. Focused on isolated lumbar/pelvic movements and stabilizing core exercises. Fatigue in back and core noted but no pain. Will continue to focus on posture and strength as tolerated.   REHAB POTENTIAL: Good     CLINICAL DECISION MAKING: Evolving/moderate complexity   EVALUATION COMPLEXITY: Moderate     GOALS: Goals reviewed with patient? Yes   SHORT TERM GOALS:   STG Name Target Date Goal status  1 Patient will be independent in self management strategies to improve quality of life and functional outcomes. Baseline:  12/06/2021  INITIAL  2 Patient will report at least 50% improvement in overall symptoms and/or function to demonstrate improved functional mobility Baseline: 0% 12/06/2021  INITIAL  3 Patient will be able to demonstrate improved lumbar mobility by at least 25% Baseline: 12/06/2021  INITIAL  4   Baseline:      5   Baseline:      6   Baseline:      7   Baseline:        LONG TERM GOALS:    LTG Name  Target Date Goal status  1 Patient will report at least 75% improvement in overall symptoms and/or function to demonstrate improved functional mobility Baseline:0% 01/03/2022  INITIAL  2 Patient will be able to sit in good upright posture on ischial tuberosities for at least 1 minute to demonstrate improved active sitting posture.  Baseline: 01/03/2022  INITIAL  3 Patient will be able to demonstrate at least 4+ MMT in hip extensors Baseline: 01/03/2022  INITIAL  4   Baseline:      5   Baseline:      6   Baseline:      7   Baseline:            PLAN: PT FREQUENCY: 2x/week   PT DURATION: 8 weeks   PLANNED INTERVENTIONS: Therapeutic exercises, Therapeutic  activity, Neuro Muscular re-education, Balance training, Gait training, Patient/Family education, Joint mobilization, Dry Needling, Cryotherapy, Moist heat, and Manual therapy   PLAN FOR NEXT SESSION: lat pull down, lumbar mobility focus on lumbar lordosis(lay over towel), core strengthening/activation, glute strengthening, add right wrist stretches    8:47 AM, 11/27/21 Jerene Pitch, DPT Physical Therapy with [facility]  [phone] office

## 2021-11-29 ENCOUNTER — Other Ambulatory Visit: Payer: Self-pay

## 2021-11-29 ENCOUNTER — Encounter: Payer: Self-pay | Admitting: Physical Therapy

## 2021-11-29 ENCOUNTER — Ambulatory Visit (INDEPENDENT_AMBULATORY_CARE_PROVIDER_SITE_OTHER): Payer: 59 | Admitting: Physical Therapy

## 2021-11-29 DIAGNOSIS — M549 Dorsalgia, unspecified: Secondary | ICD-10-CM

## 2021-11-29 DIAGNOSIS — M545 Low back pain, unspecified: Secondary | ICD-10-CM

## 2021-11-29 NOTE — Therapy (Signed)
OUTPATIENT PHYSICAL THERAPY TREATMENT NOTE   Patient Name: Jesse Wong MRN: [id number] DOB:[birth year], 41 y.o., male Today's Date: 11/29/2021  PCP: Hali Marry, MD REFERRING PROVIDER: Hali Marry, *   PT End of Session - 11/29/21 0800     Visit Number 7    Number of Visits 16    Date for PT Re-Evaluation 01/03/22    Authorization Type  employee    Progress Note Due on Visit 10    PT Start Time 0802    PT Stop Time 0842    PT Time Calculation (min) 40 min    Activity Tolerance Patient tolerated treatment well             Past Medical History:  Diagnosis Date   Allergy    Arthritis    Asthma    Chronic prostatitis    Myalgia    Pulmonary infiltrates    Sinusitis, chronic    Tonsillar abscess    Vasculitis ([place])    Past Surgical History:  Procedure Laterality Date   BRONCHOSCOPY  2011   TBBX ----inflammation   IR FLUORO GUIDE CV LINE RIGHT  01/31/2017   IR FLUORO GUIDE CV MIDLINE PICC RIGHT  01/30/2017   IR US GUIDE VASC ACCESS RIGHT  01/30/2017   peritonsilar abscess     TYMPANOPLASTY     VIDEO BRONCHOSCOPY Bilateral 06/25/2018   Procedure: VIDEO BRONCHOSCOPY WITHOUT FLUORO;  Surgeon: [name], MD;  Location: [facility];  Service: Cardiopulmonary;  Laterality: Bilateral;   Patient Active Problem List   Diagnosis Date Noted   Coccygeal pain 06/16/2020   Nose injury, initial encounter 06/16/2020   Atherosclerosis of aorta ([place]) 04/12/2019   Pulmonary infiltrates    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[place]) 03/03/2018   Medication monitoring encounter 03/03/2018   Pulmonary nodule seen on imaging study 01/15/2018   Microscopic polyangiitis ([place]) 03/14/2016   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   Dyshidrotic eczema 04/08/2014   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   MYALGIA 06/28/2010   GERD 06/27/2010   Nonspecific (abnormal) findings on radiological and other examination of body structure 05/29/2010   CT, CHEST, ABNORMAL  05/29/2010   Asthma with bronchitis 04/09/2010   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    PCP: Hali Marry, MD   REFERRING PROVIDER: Hali Marry, *   REFERRING DIAG: M54.50 (ICD-10-CM) - Acute midline low back pain, unspecified whether sciatica present   THERAPY DIAG:  Acute midline low back pain, unspecified whether sciatica present - Plan: PT plan of care cert/re-cert   Mid back pain - Plan: PT plan of care cert/re-cert   ONSET DATE: December 2022   SUBJECTIVE:  SUBJECTIVE STATEMENT: States that he is feeling pretty good but Wednesday is always crazy. States he has some pain but doesn't now if it is the medication or the weird day yesterday. States overall he feels 70% better. States that he is able to manage his pain and positioning better. States that he is no longer having pain going to bed.    PERTINENT HISTORY:  psoriatic arthritis, Vasculitis, low bone density, - has been on multiple drugs for autoimmune - CellCept most recent    PAIN:  Are you having pain? Yes NPRS scale: 2/10 Pain location:  mid to lower lumbar spine. Pain orientation: mid PAIN TYPE: aching Pain description: intermittent  Aggravating factors: no lumbar/firm support Relieving factors: massage, gamers chair, lumbar support   PRECAUTIONS: Other: possible osteopenia     FALLS:  Has patient fallen in last 6 months? 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 No spinal canal stenosis or neural foraminal narrowing. 2. 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   MR LUMBAR SPINE  IMPRESSION   1. No spinal canal stenosis or neural foraminal narrowing. 2. 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 3. 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 Please correlate with imaging if any intervention is planned.         SCREENING FOR RED FLAGS: Bowel or bladder incontinence: No Spinal tumors: No Cauda equina syndrome: No     COGNITION:          Overall cognitive status: Within functional limits for tasks assessed                          MUSCLE LENGTH: assess next session Hamstrings: Right  deg; Left  deg Marcello Moores test: Right deg; Left  deg   POSTURE:  Reduced lumbar lordosis, sacral sitting, hitches at thoracolumbar and lumbosacral junctions   Observation: left hip more restricted than right         LUMBARAROM/PROM   A/PROM A/PROM  11/08/2021  Flexion 75% limited*  Extension 75% limited  Right lateral flexion 50% limited  Left lateral flexion 45% limited  Right rotation    Left rotation     (Blank rows = not tested) *Hitches at T11 SP on return upright with flexion         Lower Extremity Right 11/29/2021 Left 11/29/2021    A/PROM MMT A/PROM MMT  Hip Flexion   5   5  Hip Extension 15 (VE) 4+ 15(VE) 4+  Hip Abduction          Hip Adduction          Hip Internal rotation WNL   WNL    Hip External rotation WNL   WNL    Knee Flexion   4+   4+  Knee Extension   5   5  Ankle Dorsiflexion          Ankle Plantarflexion          Ankle Inversion          Ankle Eversion           (Blank rows = not tested)          *Compensates in  lumbar/thoracic paraspinals           (VE)= visual estimation    TODAY'S TREATMENT  11/29/2021 TE: reviewed all exercises and came up with 5 minute routine -practiced and developled with PT Seated ball exercises pelvic tilts anterior/posterior/lateral 3 minutes total, kick-outs x15 5" holds  11/22/21 Standing: pallof press - 2x12 bilat green theraband,  shoulder, shoulder pull to chest green theraband 2x12 5" holds, posture holds at wall 4 minutes, chest stretch x4 20" holds   11/20/21 Supine:: TRA activation lower rib depression and approximation 12 minutes total - tactile and verbal cues, TRA with bridge -2x5 5" holds   11/15/21 Standing: shoulder extension dowel 2x15 5", thoracic rotation with stick 5 minutes standing then seated with tactile cues 5 minutes Neuro: posterior pelvic tilt and chin tuck at wall - 15 minutes   11/12/21 Therapeutic Exercise:  Aerobic: Supine:LTR 2 minutes, lumbar roll supine 2 minutes, bridge x25 5" holds Prone:  Seated: hamstring stretch x3 30"  Standing:   Quadruped: cat/cow x20 10" holds each position - verbal and tactile cues Neuromuscular Re-education:hip hinge with forward reach 4  minutes - prior demo and verbal and tactile cues     PATIENT EDUCATION:  Education details: on progress made, on HEP, on plan on 2 5 minute mobility routines, on continued limitations Person educated: Patient Education method: Consulting civil engineer, Demonstration, and Handouts Education comprehension: verbalized understanding       HOME EXERCISE PROGRAM: [facility] Laying on your back with your knees bent and hands on lower ribs - breath out as if you are blowing out 100 birthday candles on one breath - maintain abdominal contraction then relax and repeat- next try to maintain contraction for a couple of breaths while you sip in air and maintain the rib depression/approximation - practice 5-10 minutes  Add in chin tuck with breathing exercises as tolerated Can reach and press elbows up towards ceiling   ASSESSMENT:   CLINICAL IMPRESSION: Patient is a 41 y.o. male who was seen today for physical therapy evaluation and treatment for back pain. Objective impairments include decreased mobility, decreased ROM, decreased strength, postural dysfunction, and pain. These impairments are limiting patient from  sitting,walking (when  aggravated) . Personal factors including 1 comorbidity: psoriatic arthritis, 1-2 comorbidities: Vasculitis, and 3+ comorbidities: multiple courses of immunosuppressive drugs  are also affecting patient's functional outcome. Patient will benefit from skilled PT to address above impairments and improve overall function.   11/29/2021 Patient tolerated session well. Reviewed goals and patient is improving in function, pain and strength. Discussed plan moving forward and will continue to focus on pain management strategies and HEP development.    REHAB POTENTIAL: Good     CLINICAL DECISION MAKING: Evolving/moderate complexity   EVALUATION COMPLEXITY: Moderate     GOALS: Goals reviewed with patient? Yes   SHORT TERM GOALS:   STG Name Target Date Goal status  1 Patient will be independent in self management strategies to improve quality of life and functional outcomes. Baseline:  12/06/2021  MET  2 Patient will report at least 50% improvement in overall symptoms and/or function to demonstrate improved functional mobility Baseline: 70% 12/06/2021  MET  3 Patient will be able to demonstrate improved lumbar mobility by at least 25% Baseline: 12/06/2021  PROGRESSING  4   Baseline:      5   Baseline:      6   Baseline:      7   Baseline:  LONG TERM GOALS:    LTG Name Target Date Goal status  1 Patient will report at least 75% improvement in overall symptoms and/or function to demonstrate improved functional mobility Baseline:70% 01/03/2022  PROGRESSING  2 Patient will be able to sit in good upright posture on ischial tuberosities for at least 1 minute to demonstrate improved active sitting posture.  Baseline: 01/03/2022  MET  3 Patient will be able to demonstrate at least 4+ MMT in hip extensors Baseline: 01/03/2022  MET  4  Patient will be able to manage symptoms throughout the day to report less pain at the end of the day with 5-10 minute mobility routine Baseline:  01/03/22  NEW  5    Baseline:      6   Baseline:      7   Baseline:            PLAN: PT FREQUENCY: 2x/week   PT DURATION: 8 weeks   PLANNED INTERVENTIONS: Therapeutic exercises, Therapeutic activity, Neuro Muscular re-education, Balance training, Gait training, Patient/Family education, Joint mobilization, Dry Needling, Cryotherapy, Moist heat, and Manual therapy   PLAN FOR NEXT SESSION: self mobilization techniques to paraspinals and spinous processes and exercises with different gadgets    9:07 AM, 11/29/21 Jerene Pitch, DPT Physical Therapy with [facility]  [phone] office

## 2021-11-30 ENCOUNTER — Encounter: Payer: Self-pay | Admitting: Family Medicine

## 2021-11-30 ENCOUNTER — Telehealth: Payer: 59 | Admitting: Nurse Practitioner

## 2021-11-30 DIAGNOSIS — U071 COVID-19: Secondary | ICD-10-CM

## 2021-11-30 MED ORDER — NIRMATRELVIR/RITONAVIR (PAXLOVID)TABLET: 3.0000 | ORAL_TABLET | Freq: Two times a day (BID) | ORAL | 0 refills | Status: AC

## 2021-11-30 MED ORDER — BENZONATATE 100 MG PO CAPS: 100.0000 mg | ORAL_CAPSULE | Freq: Three times a day (TID) | ORAL | 0 refills | Status: DC | PRN

## 2021-11-30 NOTE — Patient Instructions (Signed)
COVID-19: Quarantine and Isolation °Quarantine °If you were exposed °Quarantine and stay away from others when you have been in close contact with someone who has COVID-19. °Isolate °If you are sick or test positive °Isolate when you are sick or when you have COVID-19, even if you don't have symptoms. °When to stay home °Calculating quarantine °The date of your exposure is considered day 0. Day 1 is the first full day after your last contact with a person who has had COVID-19. Stay home and away from other people for at least 5 days. Learn why CDC updated guidance for the general public. °IF YOU were exposed to COVID-19 and are NOT  °up to dateIF YOU were exposed to COVID-19 and are NOT on COVID-19 vaccinations °Quarantine for at least 5 days °Stay home °Stay home and quarantine for at least 5 full days. °Wear a well-fitting mask if you must be around others in your home. °Do not travel. °Get tested °Even if you don't develop symptoms, get tested at least 5 days after you last had close contact with someone with COVID-19. °After quarantine °Watch for symptoms °Watch for symptoms until 10 days after you last had close contact with someone with COVID-19. °Avoid travel °It is best to avoid travel until a full 10 days after you last had close contact with someone with COVID-19. °If you develop symptoms °Isolate immediately and get tested. Continue to stay home until you know the results. Wear a well-fitting mask around others. °Take precautions until day 10 °Wear a well-fitting mask °Wear a well-fitting mask for 10 full days any time you are around others inside your home or in public. Do not go to places where you are unable to wear a well-fitting mask. °If you must travel during days 6-10, take precautions. °Avoid being around people who are more likely to get very sick from COVID-19. °IF YOU were exposed to COVID-19 and are  °up to dateIF YOU were exposed to COVID-19 and are on COVID-19 vaccinations °No  quarantine °You do not need to stay home unless you develop symptoms. °Get tested °Even if you don't develop symptoms, get tested at least 5 days after you last had close contact with someone with COVID-19. °Watch for symptoms °Watch for symptoms until 10 days after you last had close contact with someone with COVID-19. °If you develop symptoms °Isolate immediately and get tested. Continue to stay home until you know the results. Wear a well-fitting mask around others. °Take precautions until day 10 °Wear a well-fitting mask °Wear a well-fitting mask for 10 full days any time you are around others inside your home or in public. Do not go to places where you are unable to wear a well-fitting mask. °Take precautions if traveling °Avoid being around people who are more likely to get very sick from COVID-19. °IF YOU were exposed to COVID-19 and had confirmed COVID-19 within the past 90 days (you tested positive using a viral test) °No quarantine °You do not need to stay home unless you develop symptoms. °Watch for symptoms °Watch for symptoms until 10 days after you last had close contact with someone with COVID-19. °If you develop symptoms °Isolate immediately and get tested. Continue to stay home until you know the results. Wear a well-fitting mask around others. °Take precautions until day 10 °Wear a well-fitting mask °Wear a well-fitting mask for 10 full days any time you are around others inside your home or in public. Do not go to places where you are   unable to wear a well-fitting mask. °Take precautions if traveling °Avoid being around people who are more likely to get very sick from COVID-19. °Calculating isolation °Day 0 is your first day of symptoms or a positive viral test. Day 1 is the first full day after your symptoms developed or your test specimen was collected. If you have COVID-19 or have symptoms, isolate for at least 5 days. °IF YOU tested positive for COVID-19 or have symptoms, regardless of  vaccination status °Stay home for at least 5 days °Stay home for 5 days and isolate from others in your home. °Wear a well-fitting mask if you must be around others in your home. °Do not travel. °Ending isolation if you had symptoms °End isolation after 5 full days if you are fever-free for 24 hours (without the use of fever-reducing medication) and your symptoms are improving. °Ending isolation if you did NOT have symptoms °End isolation after at least 5 full days after your positive test. °If you got very sick from COVID-19 or have a weakened immune system °You should isolate for at least 10 days. Consult your doctor before ending isolation. °Take precautions until day 10 °Wear a well-fitting mask °Wear a well-fitting mask for 10 full days any time you are around others inside your home or in public. Do not go to places where you are unable to wear a well-fitting mask. °Do not travel °Do not travel until a full 10 days after your symptoms started or the date your positive test was taken if you had no symptoms. °Avoid being around people who are more likely to get very sick from COVID-19. °Definitions °Exposure °Contact with someone infected with SARS-CoV-2, the virus that causes COVID-19, in a way that increases the likelihood of getting infected with the virus. °Close contact °A close contact is someone who was less than 6 feet away from an infected person (laboratory-confirmed or a clinical diagnosis) for a cumulative total of 15 minutes or more over a 24-hour period. For example, three individual 5-minute exposures for a total of 15 minutes. People who are exposed to someone with COVID-19 after they completed at least 5 days of isolation are not considered close contacts. °Quarantine °Quarantine is a strategy used to prevent transmission of COVID-19 by keeping people who have been in close contact with someone with COVID-19 apart from others. °Who does not need to quarantine? °If you had close contact with  someone with COVID-19 and you are in one of the following groups, you do not need to quarantine. °You are up to date with your COVID-19 vaccines. °You had confirmed COVID-19 within the last 90 days (meaning you tested positive using a viral test). °If you are up to date with COVID-19 vaccines, you should wear a well-fitting mask around others for 10 days from the date of your last close contact with someone with COVID-19 (the date of last close contact is considered day 0). Get tested at least 5 days after you last had close contact with someone with COVID-19. If you test positive or develop COVID-19 symptoms, isolate from other people and follow recommendations in the Isolation section below. If you tested positive for COVID-19 with a viral test within the previous 90 days and subsequently recovered and remain without COVID-19 symptoms, you do not need to quarantine or get tested after close contact. You should wear a well-fitting mask around others for 10 days from the date of your last close contact with someone with COVID-19 (the date of last   close contact is considered day 0). If you have COVID-19 symptoms, get tested and isolate from other people and follow recommendations in the Isolation section below. °Who should quarantine? °If you come into close contact with someone with COVID-19, you should quarantine if you are not up to date on COVID-19 vaccines. This includes people who are not vaccinated. °What to do for quarantine °Stay home and away from other people for at least 5 days (day 0 through day 5) after your last contact with a person who has COVID-19. The date of your exposure is considered day 0. Wear a well-fitting mask when around others at home, if possible. °For 10 days after your last close contact with someone with COVID-19, watch for fever (100.4°F or greater), cough, shortness of breath, or other COVID-19 symptoms. °If you develop symptoms, get tested immediately and isolate until you receive  your test results. If you test positive, follow isolation recommendations. °If you do not develop symptoms, get tested at least 5 days after you last had close contact with someone with COVID-19. °If you test negative, you can leave your home, but continue to wear a well-fitting mask when around others at home and in public until 10 days after your last close contact with someone with COVID-19. °If you test positive, you should isolate for at least 5 days from the date of your positive test (if you do not have symptoms). If you do develop COVID-19 symptoms, isolate for at least 5 days from the date your symptoms began (the date the symptoms started is day 0). Follow recommendations in the isolation section below. °If you are unable to get a test 5 days after last close contact with someone with COVID-19, you can leave your home after day 5 if you have been without COVID-19 symptoms throughout the 5-day period. Wear a well-fitting mask for 10 days after your date of last close contact when around others at home and in public. °Avoid people who are have weakened immune systems or are more likely to get very sick from COVID-19, and nursing homes and other high-risk settings, until after at least 10 days. °If possible, stay away from people you live with, especially people who are at higher risk for getting very sick from COVID-19, as well as others outside your home throughout the full 10 days after your last close contact with someone with COVID-19. °If you are unable to quarantine, you should wear a well-fitting mask for 10 days when around others at home and in public. °If you are unable to wear a mask when around others, you should continue to quarantine for 10 days. Avoid people who have weakened immune systems or are more likely to get very sick from COVID-19, and nursing homes and other high-risk settings, until after at least 10 days. °See additional information about travel. °Do not go to places where you are  unable to wear a mask, such as restaurants and some gyms, and avoid eating around others at home and at work until after 10 days after your last close contact with someone with COVID-19. °After quarantine °Watch for symptoms until 10 days after your last close contact with someone with COVID-19. °If you have symptoms, isolate immediately and get tested. °Quarantine in high-risk congregate settings °In certain congregate settings that have high risk of secondary transmission (such as correctional and detention facilities, homeless shelters, or cruise ships), CDC recommends a 10-day quarantine for residents, regardless of vaccination and booster status. During periods of critical staffing   shortages, facilities may consider shortening the quarantine period for staff to ensure continuity of operations. Decisions to shorten quarantine in these settings should be made in consultation with state, local, tribal, or territorial health departments and should take into consideration the context and characteristics of the facility. CDC's setting-specific guidance provides additional recommendations for these settings. °Isolation °Isolation is used to separate people with confirmed or suspected COVID-19 from those without COVID-19. People who are in isolation should stay home until it's safe for them to be around others. At home, anyone sick or infected should separate from others, or wear a well-fitting mask when they need to be around others. People in isolation should stay in a specific "sick room" or area and use a separate bathroom if available. Everyone who has presumed or confirmed COVID-19 should stay home and isolate from other people for at least 5 full days (day 0 is the first day of symptoms or the date of the day of the positive viral test for asymptomatic persons). They should wear a mask when around others at home and in public for an additional 5 days. People who are confirmed to have COVID-19 or are showing  symptoms of COVID-19 need to isolate regardless of their vaccination status. This includes: °People who have a positive viral test for COVID-19, regardless of whether or not they have symptoms. °People with symptoms of COVID-19, including people who are awaiting test results or have not been tested. People with symptoms should isolate even if they do not know if they have been in close contact with someone with COVID-19. °What to do for isolation °Monitor your symptoms. If you have an emergency warning sign (including trouble breathing), seek emergency medical care immediately. °Stay in a separate room from other household members, if possible. °Use a separate bathroom, if possible. °Take steps to improve ventilation at home, if possible. °Avoid contact with other members of the household and pets. °Don't share personal household items, like cups, towels, and utensils. °Wear a well-fitting mask when you need to be around other people. °Learn more about what to do if you are sick and how to notify your contacts. °Ending isolation for people who had COVID-19 and had symptoms °If you had COVID-19 and had symptoms, isolate for at least 5 days. To calculate your 5-day isolation period, day 0 is your first day of symptoms. Day 1 is the first full day after your symptoms developed. You can leave isolation after 5 full days. °You can end isolation after 5 full days if you are fever-free for 24 hours without the use of fever-reducing medication and your other symptoms have improved (Loss of taste and smell may persist for weeks or months after recovery and need not delay the end of isolation). °You should continue to wear a well-fitting mask around others at home and in public for 5 additional days (day 6 through day 10) after the end of your 5-day isolation period. If you are unable to wear a mask when around others, you should continue to isolate for a full 10 days. Avoid people who have weakened immune systems or are more  likely to get very sick from COVID-19, and nursing homes and other high-risk settings, until after at least 10 days. °If you continue to have fever or your other symptoms have not improved after 5 days of isolation, you should wait to end your isolation until you are fever-free for 24 hours without the use of fever-reducing medication and your other symptoms have improved.   Continue to wear a well-fitting mask through day 10. Contact your healthcare provider if you have questions. °See additional information about travel. °Do not go to places where you are unable to wear a mask, such as restaurants and some gyms, and avoid eating around others at home and at work until a full 10 days after your first day of symptoms. °If an individual has access to a test and wants to test, the best approach is to use an antigen test1 towards the end of the 5-day isolation period. Collect the test sample only if you are fever-free for 24 hours without the use of fever-reducing medication and your other symptoms have improved (loss of taste and smell may persist for weeks or months after recovery and need not delay the end of isolation). If your test result is positive, you should continue to isolate until day 10. If your test result is negative, you can end isolation, but continue to wear a well-fitting mask around others at home and in public until day 10. Follow additional recommendations for masking and avoiding travel as described above. °1As noted in the labeling for authorized over-the counter antigen tests: Negative results should be treated as presumptive. Negative results do not rule out SARS-CoV-2 infection and should not be used as the sole basis for treatment or patient management decisions, including infection control decisions. To improve results, antigen tests should be used twice over a three-day period with at least 24 hours and no more than 48 hours between tests. °Note that these recommendations on ending isolation  do not apply to people who are moderately ill or very sick from COVID-19 or have weakened immune systems. See section below for recommendations for when to end isolation for these groups. °Ending isolation for people who tested positive for COVID-19 but had no symptoms °If you test positive for COVID-19 and never develop symptoms, isolate for at least 5 days. Day 0 is the day of your positive viral test (based on the date you were tested) and day 1 is the first full day after the specimen was collected for your positive test. You can leave isolation after 5 full days. °If you continue to have no symptoms, you can end isolation after at least 5 days. °You should continue to wear a well-fitting mask around others at home and in public until day 10 (day 6 through day 10). If you are unable to wear a mask when around others, you should continue to isolate for 10 days. Avoid people who have weakened immune systems or are more likely to get very sick from COVID-19, and nursing homes and other high-risk settings, until after at least 10 days. °If you develop symptoms after testing positive, your 5-day isolation period should start over. Day 0 is your first day of symptoms. Follow the recommendations above for ending isolation for people who had COVID-19 and had symptoms. °See additional information about travel. °Do not go to places where you are unable to wear a mask, such as restaurants and some gyms, and avoid eating around others at home and at work until 10 days after the day of your positive test. °If an individual has access to a test and wants to test, the best approach is to use an antigen test1 towards the end of the 5-day isolation period. If your test result is positive, you should continue to isolate until day 10. If your test result is positive, you can also choose to test daily and if your test result   is negative, you can end isolation, but continue to wear a well-fitting mask around others at home and in  public until day 10. Follow additional recommendations for masking and avoiding travel as described above. °1As noted in the labeling for authorized over-the counter antigen tests: Negative results should be treated as presumptive. Negative results do not rule out SARS-CoV-2 infection and should not be used as the sole basis for treatment or patient management decisions, including infection control decisions. To improve results, antigen tests should be used twice over a three-day period with at least 24 hours and no more than 48 hours between tests. °Ending isolation for people who were moderately or very sick from COVID-19 or have a weakened immune system °People who are moderately ill from COVID-19 (experiencing symptoms that affect the lungs like shortness of breath or difficulty breathing) should isolate for 10 days and follow all other isolation precautions. To calculate your 10-day isolation period, day 0 is your first day of symptoms. Day 1 is the first full day after your symptoms developed. If you are unsure if your symptoms are moderate, talk to a healthcare provider for further guidance. °People who are very sick from COVID-19 (this means people who were hospitalized or required intensive care or ventilation support) and people who have weakened immune systems might need to isolate at home longer. They may also require testing with a viral test to determine when they can be around others. CDC recommends an isolation period of at least 10 and up to 20 days for people who were very sick from COVID-19 and for people with weakened immune systems. Consult with your healthcare provider about when you can resume being around other people. If you are unsure if your symptoms are severe or if you have a weakened immune system, talk to a healthcare provider for further guidance. °People who have a weakened immune system should talk to their healthcare provider about the potential for reduced immune responses to  COVID-19 vaccines and the need to continue to follow current prevention measures (including wearing a well-fitting mask and avoiding crowds and poorly ventilated indoor spaces) to protect themselves against COVID-19 until advised otherwise by their healthcare provider. Close contacts of immunocompromised people--including household members--should also be encouraged to receive all recommended COVID-19 vaccine doses to help protect these people. °Isolation in high-risk congregate settings °In certain high-risk congregate settings that have high risk of secondary transmission and where it is not feasible to cohort people (such as correctional and detention facilities, homeless shelters, and cruise ships), CDC recommends a 10-day isolation period for residents. During periods of critical staffing shortages, facilities may consider shortening the isolation period for staff to ensure continuity of operations. Decisions to shorten isolation in these settings should be made in consultation with state, local, tribal, or territorial health departments and should take into consideration the context and characteristics of the facility. CDC's setting-specific guidance provides additional recommendations for these settings. °This CDC guidance is meant to supplement--not replace--any federal, state, local, territorial, or tribal health and safety laws, rules, and regulations. °Recommendations for specific settings °These recommendations do not apply to healthcare professionals. For guidance specific to these settings, see °Healthcare professionals: Interim Guidance for Managing Healthcare Personnel with SARS-CoV-2 Infection or Exposure to SARS-CoV-2 °Patients, residents, and visitors to healthcare settings: Interim Infection Prevention and Control Recommendations for Healthcare Personnel During the Coronavirus Disease 2019 (COVID-19) Pandemic °Additional setting-specific guidance and recommendations are available. °These  recommendations on quarantine and isolation do apply to K[employer]12 School   settings. Additional guidance is available here: Overview of COVID-19 Quarantine for K-12 Schools °Travelers: Travel information and recommendations °Congregate facilities and other settings: guidance pages for community, work, and school settings °Ongoing COVID-19 exposure FAQs °I live with someone with COVID-19, but I cannot be separated from them. How do we manage quarantine in this situation? °It is very important for people with COVID-19 to remain apart from other people, if possible, even if they are living together. If separation of the person with COVID-19 from others that they live with is not possible, the other people that they live with will have ongoing exposure, meaning they will be repeatedly exposed until that person is no longer able to spread the virus to other people. In this situation, there are precautions you can take to limit the spread of COVID-19: °The person with COVID-19 and everyone they live with should wear a well-fitting mask inside the home. °If possible, one person should care for the person with COVID-19 to limit the number of people who are in close contact with the infected person. °Take steps to protect yourself and others to reduce transmission in the home: °Quarantine if you are not up to date with your COVID-19 vaccines. °Isolate if you are sick or tested positive for COVID-19, even if you don't have symptoms. °Learn more about the public health recommendations for testing, mask use and quarantine of close contacts, like yourself, who have ongoing exposure. These recommendations differ depending on your vaccination status. °What should I do if I have ongoing exposure to COVID-19 from someone I live with? °Recommendations for this situation depend on your vaccination status: °If you are not up to date on COVID-19 vaccines and have ongoing exposure to COVID-19, you should: °Begin quarantine immediately and  continue to quarantine throughout the isolation period of the person with COVID-19. °Continue to quarantine for an additional 5 days starting the day after the end of isolation for the person with COVID-19. °Get tested at least 5 days after the end of isolation of the infected person that lives with them. °If you test negative, you can leave the home but should continue to wear a well-fitting mask when around others at home and in public until 10 days after the end of isolation for the person with COVID-19. °Isolate immediately if you develop symptoms of COVID-19 or test positive. °If you are up to date with COVID-19 vaccines and have ongoing exposure to COVID-19, you should: °Get tested at least 5 days after your first exposure. A person with COVID-19 is considered infectious starting 2 days before they develop symptoms, or 2 days before the date of their positive test if they do not have symptoms. °Get tested again at least 5 days after the end of isolation for the person with COVID-19. °Wear a well-fitting mask when you are around the person with COVID-19, and do this throughout their isolation period. °Wear a well-fitting mask around others for 10 days after the infected person's isolation period ends. °Isolate immediately if you develop symptoms of COVID-19 or test positive. °What should I do if multiple people I live with test positive for COVID-19 at different times? °Recommendations for this situation depend on your vaccination status: °If you are not up to date with your COVID-19 vaccines, you should: °Quarantine throughout the isolation period of any infected person that you live with. °Continue to quarantine until 5 days after the end of isolation date for the most recently infected person that lives with you. For example, if   the last day of isolation of the person most recently infected with COVID-19 was June 30, the new 5-day quarantine period starts on July 1. °Get tested at least 5 days after the end  of isolation for the most recently infected person that lives with you. °Wear a well-fitting mask when you are around any person with COVID-19 while that person is in isolation. °Wear a well-fitting mask when you are around other people until 10 days after your last close contact. °Isolate immediately if you develop symptoms of COVID-19 or test positive. °If you are up to date with your COVID-19 vaccines, you should: °Get tested at least 5 days after your first exposure. A person with COVID-19 is considered infectious starting 2 days before they developed symptoms, or 2 days before the date of their positive test if they do not have symptoms. °Get tested again at least 5 days after the end of isolation for the most recently infected person that lives with you. °Wear a well-fitting mask when you are around any person with COVID-19 while that person is in isolation. °Wear a well-fitting mask around others for 10 days after the end of isolation for the most recently infected person that lives with you. For example, if the last day of isolation for the person most recently infected with COVID-19 was June 30, the new 10-day period to wear a well-fitting mask indoors in public starts on July 1. °Isolate immediately if you develop symptoms of COVID-19 or test positive. °I had COVID-19 and completed isolation. Do I have to quarantine or get tested if someone I live with gets COVID-19 shortly after I completed isolation? °No. If you recently completed isolation and someone that lives with you tests positive for the virus that causes COVID-19 shortly after the end of your isolation period, you do not have to quarantine or get tested as long as you do not develop new symptoms. Once all of the people that live together have completed isolation or quarantine, refer to the guidance below for new exposures to COVID-19. °If you had COVID-19 in the previous 90 days and then came into close contact with someone with COVID-19, you do  not have to quarantine or get tested if you do not have symptoms. But you should: °Wear a well-fitting mask indoors in public for 10 days after your last close contact. °Monitor for COVID-19 symptoms for 10 days from the date of your last close contact. °Isolate immediately and get tested if symptoms develop. °If more than 90 days have passed since your recovery from infection, follow CDC's recommendations for close contacts. These recommendations will differ depending on your vaccination status. °01/17/2021 °Content source: National Center for Immunization and Respiratory Diseases (NCIRD), Division of Viral Diseases °This information is not intended to replace advice given to you by your health care provider. Make sure you discuss any questions you have with your health care provider. °Document Revised: 05/23/2021 Document Reviewed: 05/23/2021 °Elsevier Patient Education © 2022 Elsevier Inc. ° °

## 2021-11-30 NOTE — Progress Notes (Signed)
Virtual Visit Consent   Jesse Wong, you are scheduled for a virtual visit with Mary-Margaret Daphine Deutscher, FNP, a [facility] provider, today.     Just as with appointments in the office, your consent must be obtained to participate.  Your consent will be active for this visit and any virtual visit you may have with one of our providers in the next 365 days.     If you have a MyChart account, a copy of this consent can be sent to you electronically.  All virtual visits are billed to your insurance company just like a traditional visit in the office.    As this is a virtual visit, video technology does not allow for your provider to perform a traditional examination.  This may limit your provider's ability to fully assess your condition.  If your provider identifies any concerns that need to be evaluated in person or the need to arrange testing (such as labs, EKG, etc.), we will make arrangements to do so.     Although advances in technology are sophisticated, we cannot ensure that it will always work on either your end or our end.  If the connection with a video visit is poor, the visit may have to be switched to a telephone visit.  With either a video or telephone visit, we are not always able to ensure that we have a secure connection.     I need to obtain your verbal consent now.   Are you willing to proceed with your visit today? YES   Jesse Wong has provided verbal consent on 11/30/2021 for a virtual visit (video or telephone).   Mary-Margaret Daphine Deutscher, FNP   Date: 11/30/2021 5:40 PM   Virtual Visit via Video Note   I, Mary-Margaret Daphine Deutscher, connected with Jesse Wong ([id number], Feb 19, 1981) on 11/30/21 at  5:45 PM EST by a video-enabled telemedicine application and verified that I am speaking with the correct person using two identifiers.  Location: Patient: Virtual Visit Location Patient: Home Provider: Virtual Visit Location Provider: Mobile   I discussed the  limitations of evaluation and management by telemedicine and the availability of in person appointments. The patient expressed understanding and agreed to proceed.    History of Present Illness: Jesse Wong is a 41 y.o. who identifies as a male who was assigned male at birth, and is being seen today for covid positive .  HPI: URI  This is a new problem. The current episode started yesterday. The problem has been gradually worsening. There has been no fever. Associated symptoms include congestion, coughing, headaches, rhinorrhea and a sore throat. He has tried decongestant for the symptoms. The treatment provided mild relief.  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 Tested positive for covid this morning. Review of Systems  HENT:  Positive for congestion, rhinorrhea and sore throat.   Respiratory:  Positive for cough.   Neurological:  Positive for headaches.   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   MYALGIA 06/28/2010  GERD 06/27/2010   Nonspecific (abnormal) findings on radiological and other examination of body structure 05/29/2010   CT, CHEST, ABNORMAL 05/29/2010   Asthma with bronchitis 04/09/2010   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    Allergies:  Allergies  Allergen Reactions   Lipitor [Atorvastatin] Other (See Comments)    myalgias   Medications:  Current Outpatient Medications:    albuterol (PROVENTIL) (2.5 MG/3ML) 0.083%  nebulizer solution, Inhale 1 vial via nebulizer every 6 hours as needed for wheezing., Disp: 180 mL, Rfl: 11   albuterol (VENTOLIN HFA) 108 (90 Base) MCG/ACT inhaler, Inhale 2 puffs into the lungs every 6 (six) hours as needed for wheezing or shortness of breath., Disp: 18 g, Rfl: 5   budesonide-formoterol (SYMBICORT) 160-4.5 MCG/ACT inhaler, Inhale 2 puffs into the lungs 2 (two) times daily., Disp: 10.2 g, Rfl: 6   CALCIUM PO, Take 1 tablet by mouth 2 (two) times daily., Disp: , Rfl:    cholecalciferol (VITAMIN D) 1000 UNITS tablet, Take 1 tablet (1,000 Units total) by mouth daily. (Patient taking differently: Take 1,000 Units by mouth 2 (two) times daily.), Disp: 30 tablet, Rfl: 6   cyclobenzaprine (FLEXERIL) 10 MG tablet, Take 1 tablet (10 mg total) by mouth at bedtime as needed for muscle spasms., Disp: 30 tablet, Rfl: 0   fluticasone (FLONASE) 50 MCG/ACT nasal spray, INSTILL 2 SPRAYS INTO BOTH NOSTRILS DAILY (NEED APPT FOR FURTHER REFILLS), Disp: 16 g, Rfl: 0   meloxicam (MOBIC) 15 MG tablet, Take 1 tablet (15 mg total) by mouth daily., Disp: 30 tablet, Rfl: 0   Multiple Vitamin (MULTIVITAMIN WITH MINERALS) TABS tablet, Take 1 tablet by mouth 2 (two) times daily., Disp: , Rfl:    mupirocin ointment (BACTROBAN) 2 %, Use with nasal rinse bottle, Disp: 22 g, Rfl: 12   mycophenolate (MYFORTIC) 360 MG TBEC EC tablet, Take 1 tablet (360 mg total) by mouth two (2) times a day for 7 days, then 2 tablets (720 mg total) two (2) times a day., Disp: 134 tablet, Rfl: 2   naproxen sodium (ALEVE) 220 MG tablet, Take 440 mg by mouth 2 (two) times daily as needed (headache/pain)., Disp: , Rfl:    Nebulizers (COMPRESSOR/NEBULIZER) MISC, Use as direccted, Disp: 1 each, Rfl: 0   omeprazole (PRILOSEC) 20 MG capsule, Take 1 capsule (20 mg total) by mouth daily., Disp: 90 capsule, Rfl: 0   pravastatin (PRAVACHOL) 20 MG tablet, TAKE 1 TABLET BY MOUTH ONCE DAILY, Disp: 90 tablet, Rfl: 3   predniSONE (DELTASONE) 20 MG  tablet, Take 2 tablets (40 mg total) by mouth daily with breakfast., Disp: 10 tablet, Rfl: 0   sodium chloride HYPERTONIC 3 % nebulizer solution, Inhale 4 mL by nebulization 2  times a day., Disp: 240 mL, Rfl: 11  Current Facility-Administered Medications:    diphenhydrAMINE (BENADRYL) injection 50 mg, 50 mg, Intramuscular, Once PRN, Causey, Larna Daughters, NP   EPINEPHrine (EPI-PEN) injection 0.3 mg, 0.3 mg, Intramuscular, Once PRN, Causey, Larna Daughters, NP   methylPREDNISolone sodium succinate (SOLU-MEDROL) 125 mg/2 mL injection 125 mg, 125 mg, Intramuscular, Once PRN, Causey, Larna Daughters, NP  Observations/Objective: Patient is well-developed, well-nourished in no acute distress.  Resting comfortably  at home.  Head is normocephalic, atraumatic.  No labored breathing.  Speech is clear and coherent with logical content.  Patient is alert and oriented at baseline.  O2 sat 95%  Assessment and Plan:  Jesse Wong in today with chief complaint of Covid Positive   1. Positive self-administered antigen test for  COVID-19 1. Take meds as prescribed 2. Use a cool mist humidifier especially during the winter months and when heat has been humid. 3. Use saline nose sprays frequently 4. Saline irrigations of the nose can be very helpful if done frequently.  * 4X daily for 1 week*  * Use of a nettie pot can be helpful with this. Follow directions with this* 5. Drink plenty of fluids 6. Keep thermostat turn down low 7.For any cough or congestion- tessalon perles 8. For fever or aces or pains- take tylenol or ibuprofen appropriate for age and weight.  * for fevers greater than 101 orally you may alternate ibuprofen and tylenol every  3 hours.   Meds ordered this encounter  Medications   nirmatrelvir/ritonavir EUA (PAXLOVID) 20 x 150 MG & 10 x 100MG  TABS    Sig: Take 3 tablets by mouth 2 (two) times daily for 5 days. (Take nirmatrelvir 150 mg two tablets twice daily for 5 days  and ritonavir 100 mg one tablet twice daily for 5 days) Patient GFR is >90    Dispense:  30 tablet    Refill:  0    Order Specific Question:   Supervising Provider    Answer:   , BRIAN [[id number]]   benzonatate (TESSALON PERLES) 100 MG capsule    Sig: Take 1 capsule (100 mg total) by mouth 3 (three) times daily as needed.    Dispense:  20 capsule    Refill:  0    Order Specific Question:   Supervising Provider    Answer:   MILLER, BRIAN [[id number]]  If O2 sat drops below 89 ned to go to the ED.     Follow Up Instructions: I discussed the assessment and treatment plan with the patient. The patient was provided an opportunity to ask questions and all were answered. The patient agreed with the plan and demonstrated an understanding of the instructions.  A copy of instructions were sent to the patient via MyChart.  The patient was advised to call back or seek an in-person evaluation if the symptoms worsen or if the condition fails to improve as anticipated.  Time:  I spent 8 minutes with the patient via telehealth technology discussing the above problems/concerns.    Mary-Margaret Hyacinth Meeker, FNP

## 2021-12-04 ENCOUNTER — Encounter: Payer: 59 | Admitting: Physical Therapy

## 2021-12-06 ENCOUNTER — Encounter: Payer: 59 | Admitting: Physical Therapy

## 2021-12-07 ENCOUNTER — Other Ambulatory Visit (HOSPITAL_COMMUNITY): Payer: Self-pay

## 2021-12-11 ENCOUNTER — Ambulatory Visit (INDEPENDENT_AMBULATORY_CARE_PROVIDER_SITE_OTHER): Payer: 59 | Admitting: Physical Therapy

## 2021-12-11 ENCOUNTER — Encounter: Payer: Self-pay | Admitting: Physical Therapy

## 2021-12-11 ENCOUNTER — Other Ambulatory Visit: Payer: Self-pay

## 2021-12-11 DIAGNOSIS — M549 Dorsalgia, unspecified: Secondary | ICD-10-CM | POA: Diagnosis not present

## 2021-12-11 DIAGNOSIS — M545 Low back pain, unspecified: Secondary | ICD-10-CM

## 2021-12-11 NOTE — Therapy (Signed)
OUTPATIENT PHYSICAL THERAPY TREATMENT NOTE   Patient Name: Jesse Wong MRN: [id number] DOB:04/21/1981, 41 y.o., male Today's Date: 12/11/2021  PCP: Hali Marry, MD REFERRING PROVIDER: Hali Marry, *   PT End of Session - 12/11/21 0801     Visit Number 8    Number of Visits 16    Date for PT Re-Evaluation 01/03/22    Authorization Type [employer] employee    Progress Note Due on Visit 10    PT Start Time 0802    PT Stop Time 0840    PT Time Calculation (min) 38 min    Activity Tolerance Patient tolerated treatment well             Past Medical History:  Diagnosis Date   Allergy    Arthritis    Asthma    Chronic prostatitis    Myalgia    Pulmonary infiltrates    Sinusitis, chronic    Tonsillar abscess    Vasculitis ([place])    Past Surgical History:  Procedure Laterality Date   BRONCHOSCOPY  2011   TBBX ----inflammation   IR FLUORO GUIDE CV LINE RIGHT  01/31/2017   IR FLUORO GUIDE CV MIDLINE PICC RIGHT  01/30/2017   IR US GUIDE VASC ACCESS RIGHT  01/30/2017   peritonsilar abscess     TYMPANOPLASTY     VIDEO BRONCHOSCOPY Bilateral 06/25/2018   Procedure: VIDEO BRONCHOSCOPY WITHOUT FLUORO;  Surgeon: [name], MD;  Location: [facility];  Service: Cardiopulmonary;  Laterality: Bilateral;   Patient Active Problem List   Diagnosis Date Noted   Coccygeal pain 06/16/2020   Nose injury, initial encounter 06/16/2020   Atherosclerosis of aorta ([place]) 04/12/2019   Pulmonary infiltrates    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[place]) 03/03/2018   Medication monitoring encounter 03/03/2018   Pulmonary nodule seen on imaging study 01/15/2018   Microscopic polyangiitis ([place]) 03/14/2016   Polyarticular psoriatic arthritis ([place]) 09/08/2014   Dyshidrotic eczema 04/08/2014   Granulomatous angiitis ([place]) 07/06/2010   MYALGIA 06/28/2010   GERD 06/27/2010   Nonspecific (abnormal) findings on radiological and other examination of body structure 05/29/2010   CT, CHEST, ABNORMAL  05/29/2010   Asthma with bronchitis 04/09/2010   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    PCP: Hali Marry, MD   REFERRING PROVIDER: Hali Marry, *   REFERRING DIAG: M54.50 (ICD-10-CM) - Acute midline low back pain, unspecified whether sciatica present   THERAPY DIAG:  Acute midline low back pain, unspecified whether sciatica present - Plan: PT plan of care cert/re-cert   Mid back pain - Plan: PT plan of care cert/re-cert   ONSET DATE: December 2022   SUBJECTIVE:  SUBJECTIVE STATEMENT: Sates that he felt like he was able to manage his symptoms over the last week. States that he still finds that sitting and rolling his pelvis forward is difficult   PERTINENT HISTORY:  psoriatic arthritis, Vasculitis, low bone density, - has been on multiple drugs for autoimmune - CellCept most recent    PAIN:  Are you having pain? no NPRS scale: 0/10 Pain location:  mid to lower lumbar spine. Pain orientation: mid PAIN TYPE: aching Pain description: intermittent  Aggravating factors: no lumbar/firm support Relieving factors: massage, gamers chair, lumbar support   PRECAUTIONS: Other: possible osteopenia     FALLS:  Has patient fallen in last 6 months? 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 No spinal canal stenosis or neural foraminal narrowing. 2. 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   MR LUMBAR SPINE IMPRESSION   1. No spinal canal stenosis or neural foraminal narrowing. 2. 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 3. 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 Please correlate with imaging if any intervention is planned.         SCREENING FOR RED FLAGS: Bowel or bladder incontinence: No Spinal tumors: No Cauda equina syndrome: No     COGNITION:          Overall cognitive status: Within functional limits for tasks assessed                          MUSCLE LENGTH: assess next session Hamstrings: Right  deg; Left  deg Marcello Moores test: Right deg; Left  deg   POSTURE:  Reduced lumbar lordosis, sacral sitting, hitches at thoracolumbar and lumbosacral junctions   Observation: left hip more restricted than right         LUMBARAROM/PROM   A/PROM A/PROM  11/08/2021  Flexion 75% limited*  Extension 75% limited  Right lateral flexion 50% limited  Left lateral flexion 45% limited  Right rotation    Left rotation     (Blank rows = not tested) *Hitches at T11 SP on return upright with flexion         Lower Extremity Right 12/11/2021 Left 12/11/2021    A/PROM MMT A/PROM MMT  Hip Flexion   5   5  Hip Extension 15 (VE) 4+ 15(VE) 4+  Hip Abduction          Hip Adduction          Hip Internal rotation WNL   WNL    Hip External rotation WNL   WNL    Knee Flexion   4+   4+  Knee Extension   5   5  Ankle Dorsiflexion          Ankle Plantarflexion          Ankle Inversion          Ankle Eversion           (Blank rows = not tested)          *Compensates in lumbar/thoracic paraspinals           (VE)= visual estimation    TODAY'S TREATMENT  12/11/2021 TE: seated ball exercises tilts/marching/kickouts/circles, thoracic extension15 minutes -  verbal and tactile cues, reviewed previous HEP exercises and answered all questions 10 minutes Neuro: posture and chair education with different angles for chair dump and how to adjust for it - 15 minutes  11/22/21 Standing: pallof press - 2x12 bilat green theraband, shoulder, shoulder pull to chest green theraband 2x12 5"  holds, posture holds at wall 4 minutes, chest stretch x4 20" holds   11/20/21 Supine:: TRA activation lower rib depression and approximation 12 minutes total - tactile and verbal cues, TRA with bridge -2x5 5" holds   11/15/21 Standing: shoulder extension dowel 2x15 5", thoracic rotation with stick 5 minutes standing then seated with tactile cues 5 minutes Neuro: posterior pelvic tilt and chin tuck at wall - 15 minutes   11/12/21 Therapeutic Exercise:  Aerobic: Supine:LTR 2 minutes, lumbar roll supine 2 minutes, bridge x25 5" holds Prone:  Seated: hamstring stretch x3 30"  Standing:   Quadruped: cat/cow x20 10" holds each position - verbal and tactile cues Neuromuscular Re-education:hip hinge with forward reach 4  minutes - prior demo and verbal and tactile cues     PATIENT EDUCATION:  Education details: on current presentation, on plan moving forward Person educated: Patient Education method: Consulting civil engineer, Media planner, and Handouts Education comprehension: verbalized understanding       HOME EXERCISE PROGRAM: [facility] Laying on your back with your knees bent and hands on lower ribs - breath out as if you are blowing out 100 birthday candles on one breath - maintain abdominal contraction then relax and repeat- next try to maintain contraction for a couple of breaths while you sip in air and maintain the rib depression/approximation - practice 5-10 minutes  Add in chin tuck with breathing exercises as tolerated Can reach and press elbows up towards ceiling   ASSESSMENT:   CLINICAL IMPRESSION: Patient is a 41 y.o. male who was seen today for physical therapy evaluation and treatment for back pain. Objective impairments include decreased mobility, decreased ROM, decreased strength, postural dysfunction, and pain. These impairments are limiting patient from  sitting,walking (when aggravated) . Personal factors including 1 comorbidity: psoriatic arthritis, 1-2 comorbidities:  Vasculitis, and 3+ comorbidities: multiple courses of immunosuppressive drugs  are also affecting patient's functional outcome. Patient will benefit from skilled PT to address above impairments and improve overall function.   12/11/2021 Session focused education and brainstorming different ways to manage and improve posture while at home and work. Answered all questions and will continue to advance HEP as indicated. Patient would continue to benefit from skilled PT at this time.   REHAB POTENTIAL: Good     CLINICAL DECISION MAKING: Evolving/moderate complexity   EVALUATION COMPLEXITY: Moderate     GOALS: Goals reviewed with patient? Yes   SHORT TERM GOALS:   STG Name Target Date Goal status  1 Patient will be independent in self management strategies to improve quality of life and functional outcomes. Baseline:  12/06/2021  MET  2 Patient will report at least 50% improvement in overall symptoms and/or function to demonstrate improved functional mobility Baseline: 70% 12/06/2021  MET  3 Patient will be able to demonstrate improved lumbar mobility by at least 25% Baseline: 12/06/2021  PROGRESSING  4   Baseline:      5   Baseline:      6   Baseline:      7   Baseline:        LONG TERM GOALS:    LTG Name Target Date Goal status  1 Patient will report at least  75% improvement in overall symptoms and/or function to demonstrate improved functional mobility Baseline:70% 01/03/2022  PROGRESSING  2 Patient will be able to sit in good upright posture on ischial tuberosities for at least 1 minute to demonstrate improved active sitting posture.  Baseline: 01/03/2022  MET  3 Patient will be able to demonstrate at least 4+ MMT in hip extensors Baseline: 01/03/2022  MET  4  Patient will be able to manage symptoms throughout the day to report less pain at the end of the day with 5-10 minute mobility routine Baseline:  01/03/22  NEW  5   Baseline:      6   Baseline:      7   Baseline:             PLAN: PT FREQUENCY: 2x/week   PT DURATION: 8 weeks   PLANNED INTERVENTIONS: Therapeutic exercises, Therapeutic activity, Neuro Muscular re-education, Balance training, Gait training, Patient/Family education, Joint mobilization, Dry Needling, Cryotherapy, Moist heat, and Manual therapy   PLAN FOR NEXT SESSION: self mobilization techniques to paraspinals and spinous processes and exercises with different gadgets    9:15 AM, 12/11/21 Jerene Pitch, DPT Physical Therapy with [facility]  [phone] office

## 2021-12-12 NOTE — Progress Notes (Signed)
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 Presented 2011 with nodular infiltrates and progressive weakness/neuropathy. Bronchoscoped 2011- Neg.  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 FVC 4.91/73%, FEV1 4.19/83%, FEV1/FVC 0.85, FEF 25-75% 0.85, TLC 78%, DLCO 113% PFT 06/08/2014-normal lung volumes, minimal slowing and small airway flows with response to bronchodilator, normal diffusion CXR 08/30/2014-Emphysematous and bronchitic changes consistent with COPD. Bronchoscopy 2019-Dr. Sood-BAL-normal flora.  Aspergillus Ag BAL 0.10 WNL, fungal cultures negative, AFB neg. cytology benign. PFT [facility], 03/10/2019 without interpretation- numerical results scanned to media. 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 Presented 2011 with nodular infiltrates and progressive weakness/neuropathy. Parallel f/u with Dr Patel/ Pulmonary _0 - agrees with conservative plan for annual CT, holding bronch searching for NTM pending progression. Advised staying off macrolides in case needed for NTM in future. -Dulera 200, Albuterol hfa, neb albuterol, ipratropium 0.06% nasal Covid vax- 3 Phizer -----Pt states he has been doing okay and denies any complaints. He continues parallel f/u with [facility] pulmonary/ vasculitis,  also ENT and ID with [facility] cultured a couple of months ago. Repeat specimen sent 4 weeks ago, waiting for confirmation. Aware imaging shows progression of nodular inflammation.Cough and chest congestion slowly worse, not weather or seasonal. Daily routine morning neb albuterol then saline- only occ induces some sputum.  Dulera helpful when used intermittently- discussed availability change to Symbicort. No fever, shills, blood. Realizes Rituxin complicates.  Discussed adding Flutter device.  HRCT 03/08/21-  IMPRESSION: 1. There is extensive, clustered centrilobular and tree-in-bud nodularity as well as heterogeneous airspace opacity and small consolidations, substantially increased compared to prior examination, particularly in the bilateral upper lobes. 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 2. Mild, diffuse bilateral bronchial wall thickening, consistent with nonspecific infectious or inflammatory bronchitis. 3. No evidence of fibrotic interstitial lung disease or specific pulmonary manifestations of vasculitis. 4. Numerous prominent mediastinal and bilateral hilar lymph nodes, unchanged, likely reactive.  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 Aortic Atherosclerosis,  Presented 2011 with nodular infiltrates and progressive weakness/neuropathy. Parallel f/u with Dr Patel/ Pulmonary _1 - agrees with conservative plan for annual CT, holding bronch searching for NTM pending progression. Advised staying off macrolides in case needed for NTM in future. -Symbicort, Albuterol hfa, neb albuterol, ipratropium 0.06%  nasal Covid vax- 4 Phizer Flu vax-had Started Paxlovid for acute Covid Feb. [facility]- POS ANCA 09/18/21 Has been on CellCept.  Had a very difficult time through the month of December with back pains he associated with CellCept.  It did help his breathing.  Back pain eased when he quit CellCept.  Then had COVID infection in February, treated with Paxlovid.  Currently using Symbicort, nebulizer, rescue inhaler and benzonatate and feels chest is stable.  He points out he tends to get very sick with viral infections and wants to keep her prednisone taper available-discussed.  Currently just has dry cough. Most recent chest CT interprets the pattern as  either atypical infection or his "Wegener's" without distinct progression.  Plan is to continue annual HRCT. He will talk with his primary physician about the atherosclerosis noted on CT with consideration of cardiology evaluation. HRCT 11/26/21-  IMPRESSION: 1. Widespread areas of cylindrical bronchiectasis, peripheral bronchiolectasis, thickening of the peribronchovascular interstitium and peribronchovascular micro and macronodularity. Given the patient's history of Wegener's granulomatosis, these findings are likely reflective of disease. Alternatively, similar findings could be seen in the setting of chronic indolent atypical infectious processes such as MAI (mycobacterium avium intracellulare). 2. Small sessile nodule associated with the posterior wall of the distal right mainstem bronchus. This may simply represent some retained mucus, however, the possibility of a small bronchial ulceration is not excluded given the patient's history of Wegener's granulomatosis. 3. Aortic atherosclerosis, in addition to 2 vessel coronary artery disease. Please note that although the presence of coronary artery calcium documents the presence of coronary artery disease, the severity of this disease and any potential stenosis cannot be assessed on this non-gated  CT examination. Assessment for potential risk factor modification, dietary therapy or pharmacologic therapy may be warranted, if clinically indicated. Aortic Atherosclerosis (ICD10-I70.0).    ROS-see HPI + = positive Constitutional:   No-   weight loss, night sweats, fevers, chills, fatigue, lassitude. HEENT:   No-  headaches, difficulty swallowing, tooth/dental problems, sore throat,       No-  sneezing, itching, +ear ache,+ nasal congestion, post nasal drip,  CV:  No-   chest pain, orthopnea, PND, swelling in lower extremities, anasarca,                                                    dizziness, palpitations Resp: shortness of breath with exertion or at rest.              No-   productive cough,  + non-productive cough,  No- coughing up of blood.              No-   change in color of mucus.  + wheezing.   Skin: No-   rash or lesions. GI:  No-   heartburn, indigestion, abdominal pain, nausea, vomiting,  GU:  MS:  +  joint pain or swelling.   Neuro-     nothing unusual Psych:  No- change in mood or affect. No depression or anxiety.  No memory loss.  OBJ- Physical Exam General- Alert, Oriented, Affect-appropriate, Distress- none acute,+ tall, thin,  + looks tired today Skin- no rash Lymphadenopathy- none Head- atraumatic            Eyes- Gross vision intact, PERRLA, conjunctivae and secretions clear            Ears- Hearing, canals-normal            Nose- + marked turbinate edema, no-Septal dev, mucus, polyps, erosion, perforation             Throat- Mallampati II , mucosa clear , drainage- none, tonsils- atrophic Neck- flexible , trachea midline, no stridor , thyroid nl, carotid no bruit Chest - symmetrical excursion , unlabored           Heart/CV- RRR , no murmur , no gallop  , no rub, nl s1 s2                           -  JVD- none , edema- none, stasis changes- none, varices- none           Lung- + coarse without wheeze, fine rhonchi, unlabored,  cough-none , dullness-none,  rub- none,            Chest wall-  Abd-  Br/ Gen/ Rectal- Not done, not indicated Extrem- cyanosis- none, clubbing, none, atrophy- none, strength- nl Neuro- grossly intact to observation

## 2021-12-13 ENCOUNTER — Ambulatory Visit: Payer: 59 | Admitting: Internal Medicine

## 2021-12-13 ENCOUNTER — Other Ambulatory Visit: Payer: Self-pay | Admitting: Internal Medicine

## 2021-12-13 ENCOUNTER — Other Ambulatory Visit: Payer: Self-pay

## 2021-12-13 ENCOUNTER — Other Ambulatory Visit (HOSPITAL_COMMUNITY): Payer: Self-pay

## 2021-12-13 ENCOUNTER — Encounter: Payer: Self-pay | Admitting: Physical Therapy

## 2021-12-13 ENCOUNTER — Encounter: Payer: Self-pay | Admitting: Internal Medicine

## 2021-12-13 ENCOUNTER — Ambulatory Visit (INDEPENDENT_AMBULATORY_CARE_PROVIDER_SITE_OTHER): Payer: 59 | Admitting: Physical Therapy

## 2021-12-13 VITALS — BP 110/78 | HR 75 | Temp 98.2°F | Ht 78.0 in | Wt 186.8 lb

## 2021-12-13 DIAGNOSIS — I700 Atherosclerosis of aorta: Secondary | ICD-10-CM | POA: Diagnosis not present

## 2021-12-13 DIAGNOSIS — M545 Low back pain, unspecified: Secondary | ICD-10-CM | POA: Diagnosis not present

## 2021-12-13 DIAGNOSIS — M317 Microscopic polyangiitis: Secondary | ICD-10-CM

## 2021-12-13 DIAGNOSIS — J45909 Unspecified asthma, uncomplicated: Secondary | ICD-10-CM | POA: Diagnosis not present

## 2021-12-13 DIAGNOSIS — M549 Dorsalgia, unspecified: Secondary | ICD-10-CM

## 2021-12-13 MED ORDER — BENZONATATE 100 MG PO CAPS
100.0000 mg | ORAL_CAPSULE | Freq: Three times a day (TID) | ORAL | 2 refills | Status: DC | PRN
  Filled 2021-12-13: qty 20, 7d supply, fill #0
  Filled 2022-01-03: qty 20, 7d supply, fill #1
  Filled 2022-03-03: qty 20, 7d supply, fill #2

## 2021-12-13 MED ORDER — PREDNISONE 10 MG PO TABS
ORAL_TABLET | ORAL | 2 refills | Status: DC
  Filled 2021-12-13: qty 20, 8d supply, fill #0
  Filled 2022-01-03: qty 20, 8d supply, fill #1
  Filled 2022-03-03: qty 20, 8d supply, fill #2

## 2021-12-13 NOTE — Assessment & Plan Note (Signed)
We will continue annual chest CT with [facility] pulmonary follow-up for co-management.

## 2021-12-13 NOTE — Patient Instructions (Addendum)
Refillable scripts ot hold sent for benzonatate and prednisone taper  Please call if we can help  Order- future HRCT chest no contrast in one year  dx Microscopic Polyangiitis (Wegener's)

## 2021-12-13 NOTE — Assessment & Plan Note (Signed)
He got through COVID infection this winter.  Feels very vulnerable to respiratory viral infections. Plan-continue Symbicort, nebulizer.  Refill prednisone taper and benzonatate to hold

## 2021-12-13 NOTE — Therapy (Signed)
OUTPATIENT PHYSICAL THERAPY TREATMENT NOTE   Patient Name: Jesse Wong MRN: [id number] DOB:07-Sep-1981, 41 y.o., male Today's Date: 12/13/2021  PCP: Hali Marry, MD REFERRING PROVIDER: Hali Marry, *   PT End of Session - 12/13/21 0759     Visit Number 9    Number of Visits 16    Date for PT Re-Evaluation 01/03/22    Authorization Type [employer] employee    Progress Note Due on Visit 10    PT Start Time 0800    PT Stop Time 0840    PT Time Calculation (min) 40 min    Activity Tolerance Patient tolerated treatment well             Past Medical History:  Diagnosis Date   Allergy    Arthritis    Asthma    Chronic prostatitis    Myalgia    Pulmonary infiltrates    Sinusitis, chronic    Tonsillar abscess    Vasculitis ([place])    Past Surgical History:  Procedure Laterality Date   BRONCHOSCOPY  2011   TBBX ----inflammation   IR FLUORO GUIDE CV LINE RIGHT  01/31/2017   IR FLUORO GUIDE CV MIDLINE PICC RIGHT  01/30/2017   IR US GUIDE VASC ACCESS RIGHT  01/30/2017   peritonsilar abscess     TYMPANOPLASTY     VIDEO BRONCHOSCOPY Bilateral 06/25/2018   Procedure: VIDEO BRONCHOSCOPY WITHOUT FLUORO;  Surgeon: [name], MD;  Location: [facility];  Service: Cardiopulmonary;  Laterality: Bilateral;   Patient Active Problem List   Diagnosis Date Noted   Coccygeal pain 06/16/2020   Nose injury, initial encounter 06/16/2020   Atherosclerosis of aorta ([place]) 04/12/2019   Pulmonary infiltrates    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[place]) 03/03/2018   Medication monitoring encounter 03/03/2018   Pulmonary nodule seen on imaging study 01/15/2018   Microscopic polyangiitis ([place]) 03/14/2016   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   Dyshidrotic eczema 04/08/2014   Granulomatous angiitis ([place]) 07/06/2010   MYALGIA 06/28/2010   GERD 06/27/2010   Nonspecific (abnormal) findings on radiological and other examination of body structure 05/29/2010   CT, CHEST, ABNORMAL  05/29/2010   Asthma with bronchitis 04/09/2010   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    PCP: Hali Marry, MD   REFERRING PROVIDER: Hali Marry, *   REFERRING DIAG: M54.50 (ICD-10-CM) - Acute midline low back pain, unspecified whether sciatica present   THERAPY DIAG:  Acute midline low back pain, unspecified whether sciatica present - Plan: PT plan of care cert/re-cert   Mid back pain - Plan: PT plan of care cert/re-cert   ONSET DATE: December 2022   SUBJECTIVE:  SUBJECTIVE STATEMENT: States that he had a bad day and was hurting at the end of the day. States he did do his exercises yesterday    PERTINENT HISTORY:  psoriatic arthritis, Vasculitis, low bone density, - has been on multiple drugs for autoimmune - CellCept most recent    PAIN:  Are you having pain? no NPRS scale: 0/10 Pain location:  mid to lower lumbar spine. Pain orientation: mid PAIN TYPE: aching Pain description: intermittent  Aggravating factors: no lumbar/firm support Relieving factors: massage, gamers chair, lumbar support   PRECAUTIONS: Other: possible osteopenia     FALLS:  Has patient fallen in last 6 months? 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 No spinal canal stenosis or neural foraminal narrowing. 2. 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   MR LUMBAR SPINE IMPRESSION   1. No spinal canal stenosis or neural foraminal narrowing. 2. 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 3. Note is made of transitional anatomy,  with lumbarization of S1. Please correlate with imaging if any intervention is planned.         SCREENING FOR RED FLAGS: Bowel or bladder incontinence: No Spinal tumors: No Cauda equina syndrome: No     COGNITION:          Overall cognitive status: Within functional limits for tasks assessed                          MUSCLE LENGTH: assess next session Hamstrings: Right  deg; Left  deg Marcello Moores test: Right deg; Left  deg   POSTURE:  Reduced lumbar lordosis, sacral sitting, hitches at thoracolumbar and lumbosacral junctions   Observation: left hip more restricted than right         LUMBARAROM/PROM   A/PROM A/PROM  11/08/2021  Flexion 75% limited*  Extension 75% limited  Right lateral flexion 50% limited  Left lateral flexion 45% limited  Right rotation    Left rotation     (Blank rows = not tested) *Hitches at T11 SP on return upright with flexion         Lower Extremity Right 12/13/2021 Left 12/13/2021    A/PROM MMT A/PROM MMT  Hip Flexion   5   5  Hip Extension 15 (VE) 4+ 15(VE) 4+  Hip Abduction          Hip Adduction          Hip Internal rotation WNL   WNL    Hip External rotation WNL   WNL    Knee Flexion   4+   4+  Knee Extension   5   5  Ankle Dorsiflexion          Ankle Plantarflexion          Ankle Inversion          Ankle Eversion           (Blank rows = not tested)          *Compensates in lumbar/thoracic paraspinals           (VE)= visual estimation    TODAY'S TREATMENT  12/13/2021 TE: self mobilization with theracane and lacrosse ball -25 minutes total prior demo  11/22/21 Standing: pallof press - 2x12 bilat green theraband, shoulder, shoulder pull to chest green theraband 2x12 5" holds, posture holds at wall 4 minutes, chest stretch x4 20" holds   11/20/21 Supine:: TRA activation lower rib depression and approximation 12 minutes total - tactile and verbal cues, TRA with bridge -2x5 5" holds   11/15/21 Standing: shoulder extension dowel 2x15  5", thoracic rotation with stick 5 minutes standing then seated with tactile cues 5 minutes Neuro: posterior pelvic tilt and chin tuck at wall - 15 minutes   11/12/21 Therapeutic Exercise:  Aerobic: Supine:LTR 2 minutes, lumbar roll supine 2 minutes, bridge x25 5" holds Prone:  Seated: hamstring stretch x3 30"  Standing:   Quadruped: cat/cow x20 10" holds each position - verbal and tactile cues Neuromuscular Re-education:hip hinge with forward reach 4  minutes - prior demo and verbal and tactile cues     PATIENT EDUCATION:  Education details:on different gadgets and tools to use. On plan moving forward Person educated: Patient Education method: Explanation, Demonstration, and Handouts Education comprehension: verbalized understanding       HOME EXERCISE PROGRAM: [facility] Laying on your back with your knees bent and hands on lower ribs - breath out as if you are blowing out 100 birthday candles on one breath - maintain abdominal contraction then relax and repeat- next try to maintain contraction for a couple of breaths while you sip in air and maintain the rib depression/approximation - practice 5-10 minutes  Add in chin tuck with breathing exercises as tolerated Can reach and press elbows up towards ceiling   ASSESSMENT:   CLINICAL IMPRESSION: Patient is a 41 y.o. male who was seen today for physical therapy evaluation and treatment for back pain. Objective impairments include decreased mobility, decreased ROM, decreased strength, postural dysfunction, and pain. These impairments are limiting patient from  sitting,walking (when aggravated) . Personal factors including 1 comorbidity: psoriatic arthritis, 1-2 comorbidities: Vasculitis, and 3+ comorbidities: multiple courses of immunosuppressive drugs  are also affecting patient's functional outcome. Patient will benefit from skilled PT to address above impairments and improve overall function.   12/13/2021 Session focused on review of  exercises, different types of supplemental equipment to continue to work towards goals. Practiced with theracan and tennis ball but educated on percussion gun, infrared heat, spikey balls and acu-mat. Answered all questions and patient would like to be placed on hold at this time. Will discharge patient at end of march if he does not return to therapy. REHAB POTENTIAL: Good     CLINICAL DECISION MAKING: Evolving/moderate complexity   EVALUATION COMPLEXITY: Moderate     GOALS: Goals reviewed with patient? Yes   SHORT TERM GOALS:   STG Name Target Date Goal status  1 Patient will be independent in self management strategies to improve quality of life and functional outcomes. Baseline:  12/06/2021  MET  2 Patient will report at least 50% improvement in overall symptoms and/or function to demonstrate improved functional mobility Baseline: 70% 12/06/2021  MET  3 Patient will be able to demonstrate improved lumbar mobility by at least 25% Baseline: 12/06/2021  PROGRESSING  4   Baseline:      5   Baseline:      6   Baseline:      7   Baseline:        LONG TERM GOALS:    LTG Name Target Date Goal status  1 Patient will report at least 75% improvement in overall symptoms and/or function to demonstrate improved  functional mobility Baseline:100%  01/03/2022  MET  2 Patient will be able to sit in good upright posture on ischial tuberosities for at least 1 minute to demonstrate improved active sitting posture.  Baseline: 01/03/2022  MET  3 Patient will be able to demonstrate at least 4+ MMT in hip extensors Baseline: 01/03/2022  MET  4  Patient will be able to manage symptoms throughout the day to report less pain at the end of the day with 5-10 minute mobility routine Baseline:  01/03/22  MET  5   Baseline:      6   Baseline:      7   Baseline:            PLAN: PT FREQUENCY: 2x/week   PT DURATION: 8 weeks   PLANNED INTERVENTIONS: Therapeutic exercises, Therapeutic activity, Neuro  Muscular re-education, Balance training, Gait training, Patient/Family education, Joint mobilization, Dry Needling, Cryotherapy, Moist heat, and Manual therapy   PLAN FOR NEXT SESSION: on hold till end of April    8:47 AM, 12/13/21 Jerene Pitch, DPT Physical Therapy with [facility]  [phone] office

## 2021-12-13 NOTE — Assessment & Plan Note (Signed)
Atherosclerosis of aorta and two-vessel CAD described on chest CT. Plan-he would rather avoid unnecessary exposure to other people as associated with other waiting rooms.  We discussed establishing at some point with cardiology for risk stratification.  He does have positive family history.

## 2021-12-14 ENCOUNTER — Telehealth: Payer: Self-pay | Admitting: Family Medicine

## 2021-12-14 DIAGNOSIS — I700 Atherosclerosis of aorta: Secondary | ICD-10-CM

## 2021-12-14 NOTE — Telephone Encounter (Signed)
My chart note sent.

## 2021-12-17 NOTE — Addendum Note (Signed)
Addended by: Nani Gasser D on: 12/17/2021 05:18 PM   Modules accepted: Orders

## 2021-12-18 ENCOUNTER — Other Ambulatory Visit (HOSPITAL_COMMUNITY): Payer: Self-pay

## 2021-12-18 DIAGNOSIS — Z6821 Body mass index (BMI) 21.0-21.9, adult: Secondary | ICD-10-CM | POA: Diagnosis not present

## 2021-12-18 DIAGNOSIS — M317 Microscopic polyangiitis: Secondary | ICD-10-CM | POA: Diagnosis not present

## 2021-12-18 DIAGNOSIS — B449 Aspergillosis, unspecified: Secondary | ICD-10-CM | POA: Diagnosis not present

## 2021-12-18 DIAGNOSIS — Z7952 Long term (current) use of systemic steroids: Secondary | ICD-10-CM | POA: Diagnosis not present

## 2021-12-18 DIAGNOSIS — L4059 Other psoriatic arthropathy: Secondary | ICD-10-CM | POA: Diagnosis not present

## 2021-12-18 MED ORDER — PRAVASTATIN SODIUM 40 MG PO TABS
40.0000 mg | ORAL_TABLET | Freq: Every day | ORAL | 1 refills | Status: DC
  Filled 2021-12-18: qty 90, 90d supply, fill #0
  Filled 2022-03-21: qty 90, 90d supply, fill #1

## 2021-12-18 NOTE — Addendum Note (Signed)
Addended by: Beatrice Lecher D on: 12/18/2021 05:36 PM   Modules accepted: Orders

## 2021-12-25 DIAGNOSIS — R052 Subacute cough: Secondary | ICD-10-CM | POA: Diagnosis not present

## 2021-12-25 DIAGNOSIS — D849 Immunodeficiency, unspecified: Secondary | ICD-10-CM | POA: Diagnosis not present

## 2021-12-25 DIAGNOSIS — M3130 Wegener's granulomatosis without renal involvement: Secondary | ICD-10-CM | POA: Diagnosis not present

## 2022-01-03 ENCOUNTER — Other Ambulatory Visit (HOSPITAL_COMMUNITY): Payer: Self-pay

## 2022-02-18 DIAGNOSIS — M3130 Wegener's granulomatosis without renal involvement: Secondary | ICD-10-CM | POA: Diagnosis not present

## 2022-03-04 ENCOUNTER — Other Ambulatory Visit (HOSPITAL_COMMUNITY): Payer: Self-pay

## 2022-03-21 ENCOUNTER — Other Ambulatory Visit (HOSPITAL_COMMUNITY): Payer: Self-pay

## 2022-04-02 ENCOUNTER — Encounter: Payer: 59 | Admitting: Family Medicine

## 2022-04-15 ENCOUNTER — Other Ambulatory Visit (HOSPITAL_COMMUNITY): Payer: Self-pay

## 2022-04-16 ENCOUNTER — Other Ambulatory Visit (HOSPITAL_COMMUNITY): Payer: Self-pay

## 2022-04-18 ENCOUNTER — Other Ambulatory Visit (HOSPITAL_COMMUNITY): Payer: Self-pay

## 2022-04-18 MED ORDER — MYCOPHENOLATE SODIUM 360 MG PO TBEC
DELAYED_RELEASE_TABLET | ORAL | 3 refills | Status: DC
  Filled 2022-04-18: qty 20, 5d supply, fill #0
  Filled 2022-04-19: qty 100, 25d supply, fill #0
  Filled 2022-05-14: qty 120, 30d supply, fill #1
  Filled 2022-06-12: qty 120, 30d supply, fill #2

## 2022-04-19 ENCOUNTER — Other Ambulatory Visit (HOSPITAL_COMMUNITY): Payer: Self-pay

## 2022-05-09 ENCOUNTER — Ambulatory Visit (INDEPENDENT_AMBULATORY_CARE_PROVIDER_SITE_OTHER): Payer: 59 | Admitting: Family Medicine

## 2022-05-09 ENCOUNTER — Encounter: Payer: Self-pay | Admitting: Family Medicine

## 2022-05-09 VITALS — BP 111/61 | HR 66 | Ht 79.0 in | Wt 187.0 lb

## 2022-05-09 DIAGNOSIS — M818 Other osteoporosis without current pathological fracture: Secondary | ICD-10-CM | POA: Diagnosis not present

## 2022-05-09 DIAGNOSIS — Z0000 Encounter for general adult medical examination without abnormal findings: Secondary | ICD-10-CM | POA: Diagnosis not present

## 2022-05-09 NOTE — Progress Notes (Signed)
Complete physical exam  Patient: Jesse Wong   DOB: 02/07/1981   41 y.o. Male  MRN: [id number]  Subjective:    Chief Complaint  Patient presents with   Annual Exam    Jesse Wong is a 41 y.o. male who presents today for a complete physical exam. He reports consuming a general diet.  He generally feels fairly well.  [name] He does not have additional problems to discuss today.  Vaccines are UTD.     Most recent fall risk assessment:    05/09/2022   10:31 AM  Fall Risk   Falls in the past year? 0  Number falls in past yr: 0  Injury with Fall? 0  Risk for fall due to : No Fall Risks  Follow up Falls prevention discussed     Most recent depression screenings:    05/09/2022   10:32 AM 12/07/2020   12:54 PM  PHQ 2/9 Scores  PHQ - 2 Score 0 0      Past Surgical History:  Procedure Laterality Date   BRONCHOSCOPY  2011   TBBX ----inflammation   IR FLUORO GUIDE CV LINE RIGHT  01/31/2017   IR FLUORO GUIDE CV MIDLINE PICC RIGHT  01/30/2017   IR US GUIDE VASC ACCESS RIGHT  01/30/2017   peritonsilar abscess     TYMPANOPLASTY     VIDEO BRONCHOSCOPY Bilateral 06/25/2018   Procedure: VIDEO BRONCHOSCOPY WITHOUT FLUORO;  Surgeon: Coralyn Helling, MD;  Location: [facility];  Service: Cardiopulmonary;  Laterality: Bilateral;   Social History   Tobacco Use   Smoking status: Never   Smokeless tobacco: Never  Vaping Use   Vaping Use: Never used  Substance Use Topics   Alcohol use: Yes    Comment: occasional   Drug use: No   Family History  Problem Relation Age of Onset   Heart attack Other        grandmother   Hyperlipidemia Father    Thyroid disease Father        hashimoto's thyroidistis   Heart disease Paternal Grandfather    Colon cancer Neg Hx    Stomach cancer Neg Hx    Rectal cancer Neg Hx    Esophageal cancer Neg Hx       Patient Care Team: Agapito Games, MD as PCP - Kristine Linea, MD as Consulting  Physician (Rheumatology) Waymon Budge, MD as Consulting Physician (Pulmonary Disease) Hermenia Bers, PT as Physical Therapist (Physical Therapy)   Outpatient Medications Prior to Visit  Medication Sig   albuterol (PROVENTIL) (2.5 MG/3ML) 0.083% nebulizer solution Inhale 1 vial via nebulizer every 6 hours as needed for wheezing.   albuterol (VENTOLIN HFA) 108 (90 Base) MCG/ACT inhaler Inhale 2 puffs into the lungs every 6 (six) hours as needed for wheezing or shortness of breath.   benzonatate (TESSALON PERLES) 100 MG capsule Take 1 capsule by mouth 3 times daily as needed.   budesonide-formoterol (SYMBICORT) 160-4.5 MCG/ACT inhaler Inhale 2 puffs into the lungs 2 (two) times daily.   CALCIUM PO Take 1 tablet by mouth 2 (two) times daily.   cholecalciferol (VITAMIN D) 1000 UNITS tablet Take 1 tablet (1,000 Units total) by mouth daily. (Patient taking differently: Take 1,000 Units by mouth 2 (two) times daily.)   fluticasone (FLONASE) 50 MCG/ACT nasal spray INSTILL 2 SPRAYS INTO BOTH NOSTRILS DAILY (NEED APPT FOR FURTHER REFILLS)   Multiple Vitamin (MULTIVITAMIN WITH MINERALS) TABS tablet Take 1 tablet  by mouth 2 (two) times daily.   mupirocin ointment (BACTROBAN) 2 % Use with nasal rinse bottle   mycophenolate (MYFORTIC) 360 MG TBEC EC tablet Take 2 tablets by mouth twice daily   naproxen sodium (ALEVE) 220 MG tablet Take 440 mg by mouth 2 (two) times daily as needed (headache/pain).   Nebulizers (COMPRESSOR/NEBULIZER) MISC Use as direccted   omeprazole (PRILOSEC) 20 MG capsule Take 1 capsule (20 mg total) by mouth daily.   pravastatin (PRAVACHOL) 40 MG tablet Take 1 tablet by mouth daily.   predniSONE (DELTASONE) 10 MG tablet Take 4 tablets by mouth daily for 2 days, 3 daily for 2 days, 2 daily for 2 days then 1 daily for 2 days   predniSONE (DELTASONE) 20 MG tablet Take 2 tablets (40 mg total) by mouth daily with breakfast.   sodium chloride HYPERTONIC 3 % nebulizer solution Inhale  4 mL by nebulization 2  times a day.   [DISCONTINUED] cyclobenzaprine (FLEXERIL) 10 MG tablet Take 1 tablet (10 mg total) by mouth at bedtime as needed for muscle spasms.   [DISCONTINUED] meloxicam (MOBIC) 15 MG tablet Take 1 tablet (15 mg total) by mouth daily.   Facility-Administered Medications Prior to Visit  Medication Dose Route Frequency Provider   diphenhydrAMINE (BENADRYL) injection 50 mg  50 mg Intramuscular Once PRN Causey, Larna Daughters, NP   EPINEPHrine (EPI-PEN) injection 0.3 mg  0.3 mg Intramuscular Once PRN Causey, Larna Daughters, NP   methylPREDNISolone sodium succinate (SOLU-MEDROL) 125 mg/2 mL injection 125 mg  125 mg Intramuscular Once PRN Loa Socks, NP    ROS        Objective:     BP 111/61   Pulse 66   Ht 6\' 7"  (2.007 m)   Wt 187 lb (84.8 kg)   SpO2 97%   BMI 21.07 kg/m     Physical Exam Constitutional:      Appearance: He is well-developed.  HENT:     Head: Normocephalic and atraumatic.     Right Ear: Ear canal and external ear normal.     Left Ear: Tympanic membrane, ear canal and external ear normal.     Ears:     Comments: Right TM with scar and wrinkling of middle tissue    Nose: Nose normal.     Mouth/Throat:     Pharynx: Oropharynx is clear.  Eyes:     Conjunctiva/sclera: Conjunctivae normal.     Pupils: Pupils are equal, round, and reactive to light.  Neck:     Thyroid: No thyromegaly.  Cardiovascular:     Rate and Rhythm: Normal rate and regular rhythm.     Heart sounds: Normal heart sounds.  Pulmonary:     Effort: Pulmonary effort is normal.     Breath sounds: Normal breath sounds.  Abdominal:     General: Bowel sounds are normal. There is no distension.     Palpations: Abdomen is soft. There is no mass.     Tenderness: There is no abdominal tenderness. There is no guarding or rebound.  Musculoskeletal:        General: Normal range of motion.     Cervical back: Normal range of motion and neck supple.   Lymphadenopathy:     Cervical: No cervical adenopathy.  Skin:    General: Skin is warm and dry.  Neurological:     Mental Status: He is alert and oriented to person, place, and time.     Deep Tendon Reflexes: Reflexes are normal and  symmetric.  Psychiatric:        Behavior: Behavior normal.        Thought Content: Thought content normal.        Judgment: Judgment normal.      No results found for any visits on 05/09/22.      Assessment & Plan:    Routine Health Maintenance and Physical Exam  Immunization History  Administered Date(s) Administered   DTaP 07/06/2007   Influenza Split 07/26/2015, 07/21/2017   Influenza Whole 09/04/2009, 07/22/2019   Influenza-Unspecified 07/05/2013, 07/22/2014, 07/21/2018, 07/18/2020, 08/13/2021   PFIZER(Purple Top)SARS-COV-2 Vaccination 10/18/2019, 11/08/2019, 07/08/2020, 11/22/2021   PNEUMOCOCCAL CONJUGATE-20 10/11/2021   Pneumococcal Polysaccharide-23 03/11/2018   Td 01/20/2008   Tdap 01/25/2017   Tetanus 07/06/2007    Health Maintenance  Topic Date Due   HIV Screening  Never done   COVID-19 Vaccine (5 - Booster for Pfizer series) 01/17/2022   INFLUENZA VACCINE  05/21/2022   TETANUS/TDAP  01/26/2027   Hepatitis C Screening  Completed   Pneumococcal Vaccine 98-35 Years old  Aged Out   HPV VACCINES  Aged Out    Problem List Items Addressed This Visit   None Visit Diagnoses     Wellness examination    -  Primary   Relevant Orders   Lipid Panel w/reflex Direct LDL   COMPLETE METABOLIC PANEL WITH GFR   CBC   Other osteoporosis without current pathological fracture       Relevant Orders   DG Bone Density      Keep up a regular exercise program and make sure you are eating a healthy diet Try to eat 4 servings of dairy a day, or if you are lactose intolerant take a calcium with vitamin D daily.  Your vaccines are up to date.  DEXA ordered.    Return in about 1 year (around 05/10/2023) for Wellness Exam.     Nani Gasser, MD

## 2022-05-10 LAB — LIPID PANEL W/REFLEX DIRECT LDL
Cholesterol: 151 mg/dL (ref <200)
HDL: 36 mg/dL — ABNORMAL LOW (ref >=40)
LDL Cholesterol (Calc): 91 mg/dL (calc)
Non-HDL Cholesterol (Calc): 115 mg/dL (calc) (ref <130)
Total CHOL/HDL Ratio: 4.2 (calc) (ref <5.0)
Triglycerides: 142 mg/dL (ref <150)

## 2022-05-10 LAB — CBC
HCT: 48.8 % (ref 38.5–50.0)
Hemoglobin: 16.7 g/dL (ref 13.2–17.1)
MCH: 31.6 pg (ref 27.0–33.0)
MCHC: 34.2 g/dL (ref 32.0–36.0)
MCV: 92.2 fL (ref 80.0–100.0)
MPV: 10.6 fL (ref 7.5–12.5)
Platelets: 281 10*3/uL (ref 140–400)
RBC: 5.29 10*6/uL (ref 4.20–5.80)
RDW: 12.2 % (ref 11.0–15.0)
WBC: 5.7 10*3/uL (ref 3.8–10.8)

## 2022-05-10 LAB — COMPLETE METABOLIC PANEL WITH GFR
AG Ratio: 1.7 (calc) (ref 1.0–2.5)
ALT: 28 U/L (ref 9–46)
AST: 16 U/L (ref 10–40)
Albumin: 4.5 g/dL (ref 3.6–5.1)
Alkaline phosphatase (APISO): 67 U/L (ref 36–130)
BUN: 12 mg/dL (ref 7–25)
CO2: 29 mmol/L (ref 20–32)
Calcium: 9.3 mg/dL (ref 8.6–10.3)
Chloride: 101 mmol/L (ref 98–110)
Creat: 0.97 mg/dL (ref 0.60–1.29)
Globulin: 2.7 g/dL (calc) (ref 1.9–3.7)
Glucose, Bld: 88 mg/dL (ref 65–99)
Potassium: 4.4 mmol/L (ref 3.5–5.3)
Sodium: 139 mmol/L (ref 135–146)
Total Bilirubin: 0.7 mg/dL (ref 0.2–1.2)
Total Protein: 7.2 g/dL (ref 6.1–8.1)
eGFR: 100 mL/min/{1.73_m2} (ref >=60)

## 2022-05-10 NOTE — Progress Notes (Signed)
Hi Jesse Wong, LDL under 100 with looks which looks great.  Metabolic panel including liver and kidney function is normal.  Blood count looks good as well, no sign of anemia.  Did order your bone density test so they should hopefully call to schedule you soon.

## 2022-05-14 ENCOUNTER — Other Ambulatory Visit (HOSPITAL_COMMUNITY): Payer: Self-pay

## 2022-05-14 ENCOUNTER — Other Ambulatory Visit: Payer: Self-pay | Admitting: Internal Medicine

## 2022-05-14 MED ORDER — BUDESONIDE-FORMOTEROL FUMARATE 160-4.5 MCG/ACT IN AERO
2.0000 | INHALATION_SPRAY | Freq: Two times a day (BID) | RESPIRATORY_TRACT | 6 refills | Status: DC
  Filled 2022-05-14: qty 10.2, 30d supply, fill #0
  Filled 2022-06-12: qty 10.2, 30d supply, fill #1
  Filled 2022-12-19: qty 10.2, 30d supply, fill #2
  Filled 2023-01-24: qty 10.2, 30d supply, fill #3
  Filled 2023-03-17: qty 10.2, 30d supply, fill #4

## 2022-05-15 ENCOUNTER — Other Ambulatory Visit (HOSPITAL_COMMUNITY): Payer: Self-pay

## 2022-05-15 ENCOUNTER — Ambulatory Visit (INDEPENDENT_AMBULATORY_CARE_PROVIDER_SITE_OTHER): Payer: 59

## 2022-05-15 DIAGNOSIS — M85851 Other specified disorders of bone density and structure, right thigh: Secondary | ICD-10-CM | POA: Diagnosis not present

## 2022-05-15 DIAGNOSIS — M818 Other osteoporosis without current pathological fracture: Secondary | ICD-10-CM | POA: Diagnosis not present

## 2022-05-15 DIAGNOSIS — M85852 Other specified disorders of bone density and structure, left thigh: Secondary | ICD-10-CM | POA: Diagnosis not present

## 2022-05-16 ENCOUNTER — Other Ambulatory Visit (HOSPITAL_COMMUNITY): Payer: Self-pay

## 2022-05-20 NOTE — Progress Notes (Signed)
Hi Jesse Wong, sorry for the delay on resulting your bone density.  Your Z score is- 2.4. it was - 2.6 last time, so slightly better this time though still low. I think for now will monitor.  Make sure getting adequate calcium in diet (1200mg  per day) and try to doing weights twice a week.  Of course, try to minimize steroids as much as can.  I wouldn't mine testing vitamin D and testosterone again. It has been about 8 years. This can worsen osteoporosis. Let me know if you are doing labs anytime soon and we can order these.

## 2022-05-22 ENCOUNTER — Other Ambulatory Visit (HOSPITAL_COMMUNITY): Payer: Self-pay

## 2022-06-07 ENCOUNTER — Other Ambulatory Visit (HOSPITAL_COMMUNITY): Payer: Self-pay

## 2022-06-12 ENCOUNTER — Other Ambulatory Visit: Payer: Self-pay | Admitting: Family Medicine

## 2022-06-12 ENCOUNTER — Other Ambulatory Visit (HOSPITAL_COMMUNITY): Payer: Self-pay

## 2022-06-12 DIAGNOSIS — I700 Atherosclerosis of aorta: Secondary | ICD-10-CM

## 2022-06-13 ENCOUNTER — Other Ambulatory Visit (HOSPITAL_COMMUNITY): Payer: Self-pay

## 2022-06-14 ENCOUNTER — Other Ambulatory Visit (HOSPITAL_COMMUNITY): Payer: Self-pay

## 2022-06-14 MED ORDER — PRAVASTATIN SODIUM 40 MG PO TABS
40.0000 mg | ORAL_TABLET | Freq: Every day | ORAL | 3 refills | Status: DC
  Filled 2022-06-14: qty 90, 90d supply, fill #0
  Filled 2022-09-17: qty 90, 90d supply, fill #1
  Filled 2022-12-19: qty 90, 90d supply, fill #2
  Filled 2023-03-17: qty 90, 90d supply, fill #3

## 2022-06-17 DIAGNOSIS — M317 Microscopic polyangiitis: Secondary | ICD-10-CM | POA: Diagnosis not present

## 2022-06-17 DIAGNOSIS — L4059 Other psoriatic arthropathy: Secondary | ICD-10-CM | POA: Diagnosis not present

## 2022-06-17 DIAGNOSIS — Z6821 Body mass index (BMI) 21.0-21.9, adult: Secondary | ICD-10-CM | POA: Diagnosis not present

## 2022-06-17 DIAGNOSIS — Z7952 Long term (current) use of systemic steroids: Secondary | ICD-10-CM | POA: Diagnosis not present

## 2022-06-17 DIAGNOSIS — B449 Aspergillosis, unspecified: Secondary | ICD-10-CM | POA: Diagnosis not present

## 2022-06-25 ENCOUNTER — Other Ambulatory Visit (HOSPITAL_COMMUNITY): Payer: Self-pay

## 2022-06-25 DIAGNOSIS — D849 Immunodeficiency, unspecified: Secondary | ICD-10-CM | POA: Diagnosis not present

## 2022-06-25 DIAGNOSIS — M301 Polyarteritis with lung involvement [Churg-Strauss]: Secondary | ICD-10-CM | POA: Diagnosis not present

## 2022-06-25 DIAGNOSIS — I7782 Antineutrophilic cytoplasmic antibody (ANCA) vasculitis: Secondary | ICD-10-CM | POA: Diagnosis not present

## 2022-06-25 MED ORDER — PREDNISONE 10 MG PO TABS
ORAL_TABLET | ORAL | 0 refills | Status: DC
  Filled 2022-06-25: qty 70, 28d supply, fill #0

## 2022-06-26 ENCOUNTER — Other Ambulatory Visit (HOSPITAL_COMMUNITY): Payer: Self-pay

## 2022-07-02 ENCOUNTER — Other Ambulatory Visit: Payer: Self-pay

## 2022-07-02 ENCOUNTER — Telehealth: Payer: Self-pay | Admitting: Pharmacy Technician

## 2022-07-02 NOTE — Telephone Encounter (Addendum)
Auth Submission: PENDING - submitted on 9/19 Payer: [employer] Medication & CPT/J Code(s) submitted: ruxience - [id number] Route of submission (phone, fax, portal): PORTAL Phone # Fax # Auth type: Buy/Bill Units/visits requested: 100MG  X2 DOSES Reference number: CASE ID: [id number] Requested case to be expedited on 07/09/22 Approval from:  to    RITUXAN has been denied due to non preferred medication.  Submitted new PA for Ruxience. Pa: [phone]

## 2022-07-03 ENCOUNTER — Other Ambulatory Visit (HOSPITAL_COMMUNITY): Payer: Self-pay

## 2022-07-11 ENCOUNTER — Other Ambulatory Visit (HOSPITAL_COMMUNITY): Payer: Self-pay

## 2022-07-11 NOTE — Telephone Encounter (Signed)
Dr. Grayland Ormond, Rituxan is a non-preferred medication with [employer] and has been denied due to patient has not tried and or failed step therapy.  Preferred medication is Ruxience.  Would you like to try Ruxience?

## 2022-07-17 ENCOUNTER — Encounter: Payer: Self-pay | Admitting: Pulmonary Disease

## 2022-07-17 NOTE — Telephone Encounter (Addendum)
  Auth Submission: APPROVED Payer: [employer] Medication & CPT/J Code(s) submitted: Ruxience (Rituximab-pvvr) [phone] Route of submission (phone, fax, portal): PORTAL Phone # Fax # Auth type: Buy/Bill Units/visits requested: 375mg  q7 days x 4 wks Reference number: [id number] Approval from: 07/17/22 to 07/17/23   Ruxience co-pay card: APPROVED ID: [id number] PCN: [id number] GR: [id number] BIN: [id number] EXP: 10/20/22 - Auto re-enroll AMOUNT: $25,000

## 2022-07-17 NOTE — Addendum Note (Signed)
Addended by: Terence Lux on: 07/17/2022 09:53 AM   Modules accepted: Orders

## 2022-07-22 ENCOUNTER — Other Ambulatory Visit (HOSPITAL_COMMUNITY): Payer: Self-pay

## 2022-07-22 MED ORDER — PREDNISONE 5 MG PO TABS
5.0000 mg | ORAL_TABLET | Freq: Every day | ORAL | 1 refills | Status: DC
  Filled 2022-07-22: qty 30, 30d supply, fill #0
  Filled 2022-09-17: qty 30, 30d supply, fill #1

## 2022-07-26 ENCOUNTER — Encounter: Payer: Self-pay | Admitting: Pulmonary Disease

## 2022-07-26 ENCOUNTER — Encounter: Payer: Self-pay | Admitting: Oncology

## 2022-08-02 ENCOUNTER — Ambulatory Visit (INDEPENDENT_AMBULATORY_CARE_PROVIDER_SITE_OTHER): Payer: 59

## 2022-08-02 VITALS — BP 124/81 | HR 65 | Temp 98.7°F | Resp 16 | Ht 78.0 in | Wt 187.6 lb

## 2022-08-02 DIAGNOSIS — T451X5A Adverse effect of antineoplastic and immunosuppressive drugs, initial encounter: Secondary | ICD-10-CM

## 2022-08-02 DIAGNOSIS — M317 Microscopic polyangiitis: Secondary | ICD-10-CM | POA: Diagnosis not present

## 2022-08-02 MED ORDER — ALTEPLASE 2 MG IJ SOLR: 2.0000 mg | Freq: Once | INTRAMUSCULAR | Status: DC | PRN

## 2022-08-02 MED ORDER — SODIUM CHLORIDE 0.9 % IV SOLN
375.0000 mg/m2 | Freq: Once | INTRAVENOUS | Status: AC
  Administered 2022-08-02: 800 mg via INTRAVENOUS
  Filled 2022-08-02: qty 80

## 2022-08-02 MED ORDER — ALBUTEROL SULFATE HFA 108 (90 BASE) MCG/ACT IN AERS: 2.0000 | INHALATION_SPRAY | Freq: Once | RESPIRATORY_TRACT | Status: DC | PRN

## 2022-08-02 MED ORDER — SODIUM CHLORIDE 0.9 % IV SOLN: Freq: Once | INTRAVENOUS | Status: DC | PRN

## 2022-08-02 MED ORDER — HEPARIN SOD (PORK) LOCK FLUSH 100 UNIT/ML IV SOLN: 500.0000 [IU] | Freq: Once | INTRAVENOUS | Status: DC | PRN

## 2022-08-02 MED ORDER — DIPHENHYDRAMINE HCL 50 MG/ML IJ SOLN: 50.0000 mg | Freq: Once | INTRAMUSCULAR | Status: DC | PRN

## 2022-08-02 MED ORDER — ANTICOAGULANT SODIUM CITRATE 4% (200MG/5ML) IV SOLN: 5.0000 mL | Freq: Once | Status: DC | PRN

## 2022-08-02 MED ORDER — FAMOTIDINE IN NACL 20-0.9 MG/50ML-% IV SOLN
20.0000 mg | Freq: Once | INTRAVENOUS | Status: AC | PRN
  Administered 2022-08-02: 20 mg via INTRAVENOUS
  Filled 2022-08-02: qty 50

## 2022-08-02 MED ORDER — DIPHENHYDRAMINE HCL 50 MG/ML IJ SOLN
INTRAMUSCULAR | Status: AC
  Administered 2022-08-02: 50 mg
  Filled 2022-08-02: qty 1

## 2022-08-02 MED ORDER — METHYLPREDNISOLONE SODIUM SUCC 125 MG IJ SOLR
125.0000 mg | Freq: Once | INTRAMUSCULAR | Status: AC | PRN
  Administered 2022-08-02: 125 mg via INTRAVENOUS
  Filled 2022-08-02: qty 2

## 2022-08-02 MED ORDER — METHYLPREDNISOLONE SODIUM SUCC 125 MG IJ SOLR
100.0000 mg | Freq: Once | INTRAMUSCULAR | Status: AC
  Administered 2022-08-02: 100 mg via INTRAVENOUS
  Filled 2022-08-02: qty 2

## 2022-08-02 MED ORDER — HEPARIN SOD (PORK) LOCK FLUSH 100 UNIT/ML IV SOLN: 250.0000 [IU] | Freq: Once | INTRAVENOUS | Status: DC | PRN

## 2022-08-02 MED ORDER — EPINEPHRINE 0.3 MG/0.3ML IJ SOAJ: 0.3000 mg | Freq: Once | INTRAMUSCULAR | Status: DC | PRN

## 2022-08-02 MED ORDER — SODIUM CHLORIDE 0.9% FLUSH: 3.0000 mL | Freq: Once | INTRAVENOUS | Status: DC | PRN

## 2022-08-02 MED ORDER — SODIUM CHLORIDE 0.9% FLUSH: 10.0000 mL | Freq: Once | INTRAVENOUS | Status: DC | PRN

## 2022-08-02 NOTE — Progress Notes (Signed)
Diagnosis: microscopic polyangitis  Provider:  Marshell Garfinkel MD  Procedure: Infusion  IV Type: Peripheral, IV Location: L Forearm  Ruxience (rituximab-pvvr), Dose: 800 mg  Infusion Start Time: 0947  Rate at 74ml/hr 11:02am-Rate73ml/hr 11:35am-Rate 12ml/hr 11:59am-Medication Stopped due to complaints of "tickle in back of throat and nose"  12:05pm -Benadryl 50mg  given IVPUSH 12:10pm-Dr. Hunsucker from [place] came to evaluate. Instructed to resume infusion at initial starting rate of 37ml/hr, increase by 30ml every 40 minutes. If symptoms recur, then repeat Benadryl and stop infusion. 1:37pm-Hold medication d/t symptoms starting to recurring  2:06- Gave Solumedrol 125mg  and Pepcid 20mg  per emergency meds after consulting with pharmacy staff. Pt declines any further administration of benadryl due to sedation. Dr. Silas Flood back in to check on pt. In agreement with plan.   Pepcid (Famotidine), Dose: 20 mg  Infusion Start Time: 0962  Infusion Stop Time: 8366  2947- Pepcid complete. Resumed Ruxience at 58ml/hr. 1456: 47ml/hr 1530: 72ml/hr 1600: 150ml/hr 1720- STOPPED  Infusion Stop Time: 1720  Post Infusion IV Care: Observation period completed and Peripheral IV Discontinued  Discharge: Condition: Good, Destination: Home . AVS provided to patient.   Performed by:  Adelina Mings, LPN

## 2022-08-05 ENCOUNTER — Encounter: Payer: Self-pay | Admitting: Pulmonary Disease

## 2022-08-05 NOTE — Addendum Note (Signed)
Addended by: Jonelle Sidle E on: 08/05/2022 03:51 PM   Modules accepted: Orders

## 2022-08-06 ENCOUNTER — Other Ambulatory Visit: Payer: Self-pay | Admitting: Pharmacy Technician

## 2022-08-06 ENCOUNTER — Telehealth: Payer: Self-pay | Admitting: Pharmacy Technician

## 2022-08-06 NOTE — Telephone Encounter (Addendum)
Ruxience has been d/c'd due to patient had reaction.  Auth Submission: PENDING Payer: [employer] Medication & CPT/J Code(s) submitted: Truxima (Rituximab-abbs) [phone] Route of submission (phone, fax, portal): PORTAL Phone # Fax # Auth type: Buy/Bill Units/visits requested: X3 DOSES Reference number: CASE ID: [id number] Approval from:  to  at [facility]   Note: Letter of necessity from [place] for Truxima has been uploaded in portal for review.

## 2022-08-09 ENCOUNTER — Ambulatory Visit: Payer: 59

## 2022-08-12 ENCOUNTER — Encounter: Payer: Self-pay | Admitting: Oncology

## 2022-08-12 ENCOUNTER — Encounter: Payer: Self-pay | Admitting: Pulmonary Disease

## 2022-08-16 ENCOUNTER — Ambulatory Visit: Payer: 59

## 2022-08-16 ENCOUNTER — Ambulatory Visit (INDEPENDENT_AMBULATORY_CARE_PROVIDER_SITE_OTHER): Payer: 59

## 2022-08-16 VITALS — BP 116/77 | HR 71 | Temp 98.7°F | Resp 18 | Ht 78.0 in | Wt 187.2 lb

## 2022-08-16 DIAGNOSIS — M317 Microscopic polyangiitis: Secondary | ICD-10-CM | POA: Diagnosis not present

## 2022-08-16 MED ORDER — METHYLPREDNISOLONE SODIUM SUCC 125 MG IJ SOLR
125.0000 mg | Freq: Once | INTRAMUSCULAR | Status: AC
  Administered 2022-08-16: 125 mg via INTRAVENOUS
  Filled 2022-08-16: qty 2

## 2022-08-16 MED ORDER — SODIUM CHLORIDE 0.9 % IV SOLN
375.0000 mg/m2 | Freq: Once | INTRAVENOUS | Status: AC
  Administered 2022-08-16: 800 mg via INTRAVENOUS
  Filled 2022-08-16: qty 80

## 2022-08-16 MED ORDER — METHYLPREDNISOLONE SODIUM SUCC 125 MG IJ SOLR: 125.0000 mg | Freq: Once | INTRAMUSCULAR | Status: DC | PRN

## 2022-08-16 MED ORDER — EPINEPHRINE 0.3 MG/0.3ML IJ SOAJ: 0.3000 mg | Freq: Once | INTRAMUSCULAR | Status: DC | PRN

## 2022-08-16 MED ORDER — SODIUM CHLORIDE 0.9 % IV SOLN: Freq: Once | INTRAVENOUS | Status: DC | PRN

## 2022-08-16 MED ORDER — DIPHENHYDRAMINE HCL 50 MG/ML IJ SOLN: 50.0000 mg | Freq: Once | INTRAMUSCULAR | Status: DC | PRN

## 2022-08-16 MED ORDER — DIPHENHYDRAMINE HCL 50 MG/ML IJ SOLN
50.0000 mg | Freq: Once | INTRAMUSCULAR | Status: AC
  Administered 2022-08-16: 50 mg via INTRAVENOUS
  Filled 2022-08-16: qty 1

## 2022-08-16 MED ORDER — ALBUTEROL SULFATE HFA 108 (90 BASE) MCG/ACT IN AERS: 2.0000 | INHALATION_SPRAY | Freq: Once | RESPIRATORY_TRACT | Status: DC | PRN

## 2022-08-16 MED ORDER — FAMOTIDINE IN NACL 20-0.9 MG/50ML-% IV SOLN: 20.0000 mg | Freq: Once | INTRAVENOUS | Status: DC | PRN

## 2022-08-16 NOTE — Progress Notes (Signed)
Diagnosis: microscopic polyangitis  Provider:  Marshell Garfinkel MD  Procedure: Infusion  IV Type: Peripheral, IV Location: R Antecubital  Truxima (Rituximab-abbs), Dose: 800mg   Infusion Start Time: 0936  Infusion Stop Time: 1335  Post Infusion IV Care: Observation period completed and Peripheral IV Discontinued  Discharge: Condition: Good, Destination: Home . AVS provided to patient.   Performed by:  Adelina Mings, LPN

## 2022-08-23 ENCOUNTER — Ambulatory Visit (INDEPENDENT_AMBULATORY_CARE_PROVIDER_SITE_OTHER): Payer: 59

## 2022-08-23 VITALS — BP 131/76 | HR 67 | Temp 98.6°F | Resp 16 | Ht 78.0 in | Wt 188.0 lb

## 2022-08-23 DIAGNOSIS — M317 Microscopic polyangiitis: Secondary | ICD-10-CM | POA: Diagnosis not present

## 2022-08-23 MED ORDER — DIPHENHYDRAMINE HCL 50 MG/ML IJ SOLN
50.0000 mg | Freq: Once | INTRAMUSCULAR | Status: AC
  Administered 2022-08-23: 50 mg via INTRAVENOUS
  Filled 2022-08-23: qty 1

## 2022-08-23 MED ORDER — METHYLPREDNISOLONE SODIUM SUCC 125 MG IJ SOLR
125.0000 mg | Freq: Once | INTRAMUSCULAR | Status: AC
  Administered 2022-08-23: 125 mg via INTRAVENOUS
  Filled 2022-08-23: qty 2

## 2022-08-23 MED ORDER — SODIUM CHLORIDE 0.9 % IV SOLN
375.0000 mg/m2 | Freq: Once | INTRAVENOUS | Status: AC
  Administered 2022-08-23: 800 mg via INTRAVENOUS
  Filled 2022-08-23: qty 80

## 2022-08-23 NOTE — Progress Notes (Signed)
Diagnosis: microscopic polyangitis   Provider:  Marshell Garfinkel MD  Procedure: Infusion  IV Type: Peripheral, IV Location: L Forearm  Truxima (Rituximab-abbs), Dose: 800mg   Infusion Start Time: 0925  Infusion Stop Time: 1244  Post Infusion IV Care: Patient declined observation and Peripheral IV Discontinued  Discharge: Condition: Good, Destination: Home . AVS provided to patient.   Performed by:  Adelina Mings, LPN

## 2022-08-30 ENCOUNTER — Ambulatory Visit (INDEPENDENT_AMBULATORY_CARE_PROVIDER_SITE_OTHER): Payer: 59

## 2022-08-30 VITALS — BP 120/80 | HR 69 | Temp 99.1°F | Resp 16 | Ht 78.0 in | Wt 186.0 lb

## 2022-08-30 DIAGNOSIS — M317 Microscopic polyangiitis: Secondary | ICD-10-CM

## 2022-08-30 MED ORDER — DIPHENHYDRAMINE HCL 50 MG/ML IJ SOLN
50.0000 mg | Freq: Once | INTRAMUSCULAR | Status: AC
  Administered 2022-08-30: 50 mg via INTRAVENOUS
  Filled 2022-08-30: qty 1

## 2022-08-30 MED ORDER — METHYLPREDNISOLONE SODIUM SUCC 125 MG IJ SOLR
125.0000 mg | Freq: Once | INTRAMUSCULAR | Status: AC
  Administered 2022-08-30: 125 mg via INTRAVENOUS
  Filled 2022-08-30: qty 2

## 2022-08-30 MED ORDER — SODIUM CHLORIDE 0.9 % IV SOLN
375.0000 mg/m2 | Freq: Once | INTRAVENOUS | Status: AC
  Administered 2022-08-30: 800 mg via INTRAVENOUS
  Filled 2022-08-30: qty 80

## 2022-08-30 NOTE — Progress Notes (Signed)
Diagnosis: Microscopic Polyangiitis  Provider:  Chilton Greathouse MD  Procedure: Infusion  IV Type: Peripheral, IV Location: R Antecubital  Truxima (Rituximab-abbs), Dose: 800mg   Infusion Start Time: 0915  Infusion Stop Time: 1220  Post Infusion IV Care: Peripheral IV Discontinued  Discharge: Condition: Good, Destination: Home . AVS provided to patient.   Performed by:  [id number], RN

## 2022-09-17 ENCOUNTER — Other Ambulatory Visit (HOSPITAL_COMMUNITY): Payer: Self-pay

## 2022-09-18 ENCOUNTER — Other Ambulatory Visit (HOSPITAL_COMMUNITY): Payer: Self-pay

## 2022-10-03 ENCOUNTER — Encounter: Payer: Self-pay | Admitting: Oncology

## 2022-10-03 ENCOUNTER — Encounter: Payer: Self-pay | Admitting: Pulmonary Disease

## 2022-10-05 ENCOUNTER — Encounter: Payer: Self-pay | Admitting: Pulmonary Disease

## 2022-10-05 ENCOUNTER — Encounter: Payer: Self-pay | Admitting: Oncology

## 2022-10-09 DIAGNOSIS — L4059 Other psoriatic arthropathy: Secondary | ICD-10-CM | POA: Diagnosis not present

## 2022-10-09 DIAGNOSIS — Z7952 Long term (current) use of systemic steroids: Secondary | ICD-10-CM | POA: Diagnosis not present

## 2022-10-09 DIAGNOSIS — B449 Aspergillosis, unspecified: Secondary | ICD-10-CM | POA: Diagnosis not present

## 2022-10-09 DIAGNOSIS — Z6822 Body mass index (BMI) 22.0-22.9, adult: Secondary | ICD-10-CM | POA: Diagnosis not present

## 2022-10-09 DIAGNOSIS — M317 Microscopic polyangiitis: Secondary | ICD-10-CM | POA: Diagnosis not present

## 2022-10-14 ENCOUNTER — Encounter: Payer: 59 | Admitting: Physician Assistant

## 2022-10-14 NOTE — Progress Notes (Signed)
This encounter was created in error - please disregard.  Needed appt for daughter

## 2022-10-23 ENCOUNTER — Telehealth: Payer: Commercial Managed Care - PPO | Admitting: Nurse Practitioner

## 2022-10-23 DIAGNOSIS — R109 Unspecified abdominal pain: Secondary | ICD-10-CM

## 2022-10-23 NOTE — Progress Notes (Signed)
Virtual Visit Consent   Jesse Wong, you are scheduled for a virtual visit with a [facility] provider today. Just as with appointments in the office, your consent must be obtained to participate. Your consent will be active for this visit and any virtual visit you may have with one of our providers in the next 365 days. If you have a MyChart account, a copy of this consent can be sent to you electronically.  As this is a virtual visit, video technology does not allow for your provider to perform a traditional examination. This may limit your provider's ability to fully assess your condition. If your provider identifies any concerns that need to be evaluated in person or the need to arrange testing (such as labs, EKG, etc.), we will make arrangements to do so. Although advances in technology are sophisticated, we cannot ensure that it will always work on either your end or our end. If the connection with a video visit is poor, the visit may have to be switched to a telephone visit. With either a video or telephone visit, we are not always able to ensure that we have a secure connection.  By engaging in this virtual visit, you consent to the provision of healthcare and authorize for your insurance to be billed (if applicable) for the services provided during this visit. Depending on your insurance coverage, you may receive a charge related to this service.  I need to obtain your verbal consent now. Are you willing to proceed with your visit today? Jesse Wong has provided verbal consent on 10/23/2022 for a virtual visit (video or telephone). Apolonio Schneiders, FNP  Date: 10/23/2022 7:02 PM  Virtual Visit via Video Note   I, Apolonio Schneiders, connected with  Jesse Wong  ([id number], 06-23-1981) on 10/23/22 at  7:15 PM EST by a video-enabled telemedicine application and verified that I am speaking with the correct person using two identifiers.  Location: Patient: Virtual Visit Location Patient:  Home Provider: Virtual Visit Location Provider: Home Office   I discussed the limitations of evaluation and management by telemedicine and the availability of in person appointments. The patient expressed understanding and agreed to proceed.    History of Present Illness: Jesse Wong is a 42 y.o. who identifies as a male who was assigned male at birth, and is being seen today for abdominal pain and changes in stool color.  He has been having occasional intermittent fevers up to 101 for the past three days, acid reflux, and nausea. Noted his stools to be pale/white in color that started yesterday.   Decreased appetite yesterday.   Woke up in the middle of the night last night with acute abdominal pain that almost caused him to go to the ED did resolve within an hour.  Localized abdominal pain he believes central abdomen just below his stomach.  Denies movement that he associated with gas pains Different from kidney stone pain that he has had in the past.   He does have his gallbladder, polyp was noted on abdominal ultrasound in 2014  He has had a colonoscopy in 2021 involving biopsies that were all benign GI notes from 2021: 5-year history of intermittent diarrheal episodes occurring at least twice per week, and resulting incomplete evacuation of his bowel. Onset coincided with diagnosis of C. difficile in 2016. 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   Medication monitoring encounter 03/03/2018    Pulmonary nodule seen on imaging study 01/15/2018   Microscopic polyangiitis (HCC) 03/14/2016   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   Dyshidrotic eczema 04/08/2014   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   MYALGIA 06/28/2010   GERD 06/27/2010   Nonspecific (abnormal) findings on radiological and other examination of body structure 05/29/2010   CT, CHEST, ABNORMAL 05/29/2010   Asthma with bronchitis 04/09/2010   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    Allergies:  Allergies  Allergen Reactions   Lipitor [Atorvastatin] Other (See Comments)    myalgias   Ruxience [Rituximab-Pvvr] Itching    Itching in back of throat and nose    Medications:  Current Outpatient Medications:    albuterol (PROVENTIL) (2.5 MG/3ML) 0.083% nebulizer solution, Inhale 1 vial via nebulizer every 6 hours as needed for wheezing., Disp: 180 mL, Rfl: 11   albuterol (VENTOLIN HFA) 108 (90 Base) MCG/ACT inhaler, Inhale 2 puffs into the lungs every 6 (six) hours as needed for wheezing or shortness of breath., Disp: 18 g, Rfl: 5   benzonatate (TESSALON PERLES) 100 MG capsule, Take 1 capsule by mouth 3 times daily as needed., Disp: 20 capsule, Rfl: 2   budesonide-formoterol (SYMBICORT) 160-4.5 MCG/ACT inhaler, Inhale 2 puffs into the lungs 2 (two) times daily., Disp: 10.2 g, Rfl: 6   CALCIUM PO, Take 1 tablet by mouth 2 (two) times daily., Disp: , Rfl:    cholecalciferol (VITAMIN D) 1000 UNITS tablet, Take 1 tablet (1,000 Units total) by mouth daily. (Patient taking differently: Take 1,000 Units by mouth 2 (two) times daily.), Disp: 30 tablet, Rfl: 6   fluticasone (FLONASE) 50 MCG/ACT nasal spray, INSTILL 2 SPRAYS INTO BOTH NOSTRILS DAILY (NEED APPT FOR FURTHER REFILLS), Disp: 16 g, Rfl: 0   Multiple Vitamin (MULTIVITAMIN WITH MINERALS) TABS tablet, Take 1 tablet by mouth 2 (two) times daily., Disp: , Rfl:    mupirocin ointment (BACTROBAN) 2 %, Use with nasal rinse bottle, Disp: 22 g, Rfl: 12   mycophenolate  (MYFORTIC) 360 MG TBEC EC tablet, Take 2 tablets by mouth twice daily, Disp: 134 tablet, Rfl: 3   naproxen sodium (ALEVE) 220 MG tablet, Take 440 mg by mouth 2 (two) times daily as needed (headache/pain)., Disp: , Rfl:    Nebulizers (COMPRESSOR/NEBULIZER) MISC, Use as direccted, Disp: 1 each, Rfl: 0   omeprazole (PRILOSEC) 20 MG capsule, Take 1 capsule (20 mg total) by mouth daily., Disp: 90 capsule, Rfl: 0   pravastatin (PRAVACHOL) 40 MG tablet, Take 1 tablet by mouth daily., Disp: 90 tablet, Rfl: 3   predniSONE (DELTASONE) 10 MG tablet, Take 4 tablets by mouth daily for 2 days, 3 daily for 2 days, 2 daily for 2 days then 1 daily for 2 days, Disp: 20 tablet, Rfl: 2   predniSONE (DELTASONE) 10 MG tablet, Take 4 tabs by mouth daily for 7 days, 3 tabs daily for 7 days, 2 tabs daily for 7 days, 1 tab daily for 7 days., Disp: 70 tablet, Rfl: 0   predniSONE (DELTASONE) 20 MG tablet, Take 2 tablets (40 mg total) by mouth daily with breakfast., Disp: 10 tablet, Rfl: 0   predniSONE (DELTASONE) 5 MG tablet, Take 1 tablet (5  mg total) by mouth daily., Disp: 30 tablet, Rfl: 1   sodium chloride HYPERTONIC 3 % nebulizer solution, Inhale 4 mL by nebulization 2  times a day., Disp: 240 mL, Rfl: 11  Current Facility-Administered Medications:    diphenhydrAMINE (BENADRYL) injection 50 mg, 50 mg, Intramuscular, Once PRN, Causey, Charlestine Massed, NP   EPINEPHrine (EPI-PEN) injection 0.3 mg, 0.3 mg, Intramuscular, Once PRN, Causey, Charlestine Massed, NP   methylPREDNISolone sodium succinate (SOLU-MEDROL) 125 mg/2 mL injection 125 mg, 125 mg, Intramuscular, Once PRN, Causey, Charlestine Massed, NP  Observations/Objective: Patient is well-developed, well-nourished in no acute distress.  Resting comfortably  at home.  Head is normocephalic, atraumatic.  No labored breathing.  Speech is clear and coherent with logical content.  Patient is alert and oriented at baseline.    Assessment and Plan: 1. Abdominal pain,  unspecified abdominal location Discussed with patient that imaging is necessary for diagnosis.  Copy PCP on note and advised to call office in the am to discuss outpatient imaging   If pain returns, fever returns are persists, nausea with vomiting or other new or worsening symptoms he will report to ED for immediate assessment as discussed.        Follow Up Instructions: I discussed the assessment and treatment plan with the patient. The patient was provided an opportunity to ask questions and all were answered. The patient agreed with the plan and demonstrated an understanding of the instructions.  A copy of instructions were sent to the patient via MyChart unless otherwise noted below.    The patient was advised to call back or seek an in-person evaluation if the symptoms worsen or if the condition fails to improve as anticipated.  Time:  I spent 15 minutes with the patient via telehealth technology discussing the above problems/concerns.    Apolonio Schneiders, FNP

## 2022-10-24 ENCOUNTER — Encounter: Payer: Self-pay | Admitting: Family Medicine

## 2022-10-24 ENCOUNTER — Ambulatory Visit: Payer: Commercial Managed Care - PPO

## 2022-10-24 ENCOUNTER — Telehealth: Payer: Self-pay | Admitting: Pharmacy Technician

## 2022-10-24 ENCOUNTER — Ambulatory Visit (INDEPENDENT_AMBULATORY_CARE_PROVIDER_SITE_OTHER): Payer: Commercial Managed Care - PPO

## 2022-10-24 ENCOUNTER — Ambulatory Visit (INDEPENDENT_AMBULATORY_CARE_PROVIDER_SITE_OTHER): Payer: Commercial Managed Care - PPO | Admitting: Family Medicine

## 2022-10-24 VITALS — BP 115/75 | HR 75 | Temp 98.8°F | Ht 79.0 in | Wt 189.0 lb

## 2022-10-24 DIAGNOSIS — R1013 Epigastric pain: Secondary | ICD-10-CM

## 2022-10-24 DIAGNOSIS — R195 Other fecal abnormalities: Secondary | ICD-10-CM

## 2022-10-24 DIAGNOSIS — R110 Nausea: Secondary | ICD-10-CM | POA: Diagnosis not present

## 2022-10-24 DIAGNOSIS — R509 Fever, unspecified: Secondary | ICD-10-CM

## 2022-10-24 DIAGNOSIS — R197 Diarrhea, unspecified: Secondary | ICD-10-CM | POA: Diagnosis not present

## 2022-10-24 LAB — CBC WITH DIFFERENTIAL/PLATELET
Absolute Monocytes: 534 cells/uL (ref 200–950)
Basophils Absolute: 28 cells/uL (ref 0–200)
Basophils Relative: 0.5 %
Eosinophils Absolute: 253 cells/uL (ref 15–500)
Eosinophils Relative: 4.6 %
HCT: 47.4 % (ref 38.5–50.0)
Hemoglobin: 16.6 g/dL (ref 13.2–17.1)
Lymphs Abs: 1100 cells/uL (ref 850–3900)
MCH: 31.6 pg (ref 27.0–33.0)
MCHC: 35 g/dL (ref 32.0–36.0)
MCV: 90.1 fL (ref 80.0–100.0)
MPV: 9.9 fL (ref 7.5–12.5)
Monocytes Relative: 9.7 %
Neutro Abs: 3586 cells/uL (ref 1500–7800)
Neutrophils Relative %: 65.2 %
Platelets: 240 10*3/uL (ref 140–400)
RBC: 5.26 10*6/uL (ref 4.20–5.80)
RDW: 12 % (ref 11.0–15.0)
Total Lymphocyte: 20 %
WBC: 5.5 10*3/uL (ref 3.8–10.8)

## 2022-10-24 LAB — COMPLETE METABOLIC PANEL WITH GFR
AG Ratio: 1.7 (calc) (ref 1.0–2.5)
ALT: 47 U/L — ABNORMAL HIGH (ref 9–46)
AST: 33 U/L (ref 10–40)
Albumin: 4.6 g/dL (ref 3.6–5.1)
Alkaline phosphatase (APISO): 76 U/L (ref 36–130)
BUN: 14 mg/dL (ref 7–25)
CO2: 32 mmol/L (ref 20–32)
Calcium: 9.2 mg/dL (ref 8.6–10.3)
Chloride: 103 mmol/L (ref 98–110)
Creat: 0.9 mg/dL (ref 0.60–1.29)
Globulin: 2.7 g/dL (calc) (ref 1.9–3.7)
Glucose, Bld: 88 mg/dL (ref 65–99)
Potassium: 4.2 mmol/L (ref 3.5–5.3)
Sodium: 141 mmol/L (ref 135–146)
Total Bilirubin: 0.9 mg/dL (ref 0.2–1.2)
Total Protein: 7.3 g/dL (ref 6.1–8.1)
eGFR: 109 mL/min/{1.73_m2} (ref >=60)

## 2022-10-24 LAB — LIPASE: Lipase: 39 U/L (ref 7–60)

## 2022-10-24 MED ORDER — IOHEXOL 300 MG/ML  SOLN
100.0000 mL | Freq: Once | INTRAMUSCULAR | Status: AC | PRN
  Administered 2022-10-24: 100 mL via INTRAVENOUS

## 2022-10-24 NOTE — Addendum Note (Signed)
Addended by: Beatrice Lecher D on: 10/24/2022 01:54 PM   Modules accepted: Orders

## 2022-10-24 NOTE — Progress Notes (Signed)
That ALT liver enzyme is just mildly elevated again similar to about 2 years ago.  Not in a worrisome range but I would like to recheck it in a month just to make sure that it goes back down.  Could definitely occur with a viral illness.  Blood count looks great no worrisome elevation in your white blood cells.  And no sign of acute pancreatitis.

## 2022-10-24 NOTE — Progress Notes (Signed)
Hi BJ, it basically looks like colitis which is inflammation of the colon wall.  This can typically Happen with viral illnesses.  They did not see anything else worrisome.  Pancreas looks good.  Liver also looks good.  Gallbladder was not dilated.  I think at this point just continue to stay well-hydrated and advance your diet as tolerated hopefully her appetite will start coming back.

## 2022-10-24 NOTE — Progress Notes (Addendum)
Acute Office Visit  Subjective:     Patient ID: Jesse Wong, male    DOB: 11/14/1980, 41 y.o.   MRN: [id number]  Chief Complaint  Patient presents with   Abdominal Pain         HPI Patient is in today for severe abd pain that started 2 night ago. Pain was 9/10 and almost went to the ED. Lasted about 45 min and then got relief.  Has felt extremely nauseated to the point of wanting to vomit but no vomiting.  Still has some residual nausea.  And most alarmingly the stools were very pale most of beige color.  They have been loose but not watery.  No blood in the stools.  He did have a fever a couple nights ago to 101.  Is not had much to eat today appetite is way down.  He is trying to stay hydrated.  ROS      Objective:    BP 115/75   Pulse 75   Temp 98.8 F (37.1 C)   Ht 6\' 7"  (2.007 m)   Wt 189 lb (85.7 kg)   SpO2 98%   BMI 21.29 kg/m    Physical Exam Constitutional:      Appearance: He is well-developed.  HENT:     Head: Normocephalic and atraumatic.  Cardiovascular:     Rate and Rhythm: Normal rate and regular rhythm.     Heart sounds: Normal heart sounds.  Pulmonary:     Effort: Pulmonary effort is normal.     Breath sounds: Wheezing present.     Comments: Slight expiratory wheeze in the left lower lung Abdominal:     Tenderness: There is generalized abdominal tenderness. There is no guarding or rebound.  Skin:    General: Skin is warm and dry.  Neurological:     Mental Status: He is alert and oriented to person, place, and time.  Psychiatric:        Behavior: Behavior normal.     No results found for any visits on 10/24/22.      Assessment & Plan:   Problem List Items Addressed This Visit   None Visit Diagnoses     Pale stool    -  Primary   Relevant Orders   CT Abdomen Pelvis Wo Contrast   CBC with Differential/Platelet   COMPLETE METABOLIC PANEL WITH GFR   Lipase   CT Abdomen Pelvis W Contrast   Epigastric pain       Relevant  Orders   CT Abdomen Pelvis Wo Contrast   CBC with Differential/Platelet   COMPLETE METABOLIC PANEL WITH GFR   Lipase   CT Abdomen Pelvis W Contrast   Nausea       Relevant Orders   CT Abdomen Pelvis Wo Contrast   CBC with Differential/Platelet   COMPLETE METABOLIC PANEL WITH GFR   Lipase   CT Abdomen Pelvis W Contrast   Loose stools       Relevant Orders   CBC with Differential/Platelet   COMPLETE METABOLIC PANEL WITH GFR   Lipase   CT Abdomen Pelvis W Contrast      Epigastric pain with pale stools and fever-could be a gallbladder disorder versus pancreatic issue versus gastroenteritis.  We discussed options.  Will get CT for further workup.  Will also get stat labs including CBC, CMP to evaluate liver function and a lipase to evaluate pancreatic function.  No orders of the defined types were placed in this encounter.  No follow-ups on file.  Beatrice Lecher, MD

## 2022-10-24 NOTE — Telephone Encounter (Addendum)
New insurance info:  AETNA: [id number] GR: [id number]  Auth Submission: APPROVED Payer: [employer] Medication & CPT/J Code(s) submitted: Truxima (Rituximab-abbs) [phone] Route of submission (phone, fax, portal):  Phone [phone] Fax [phone] Auth type: Buy/Bill Units/visits requested: 2 DOSES Reference number:  [id number] Approval from:  01/16/23 - 01/16/24

## 2022-10-25 ENCOUNTER — Other Ambulatory Visit: Payer: Self-pay | Admitting: *Deleted

## 2022-10-25 DIAGNOSIS — R7401 Elevation of levels of liver transaminase levels: Secondary | ICD-10-CM

## 2022-10-25 MED ORDER — ONDANSETRON HCL 4 MG PO TABS: 4.0000 mg | ORAL_TABLET | Freq: Three times a day (TID) | ORAL | 0 refills | Status: DC | PRN

## 2022-10-25 NOTE — Addendum Note (Signed)
Addended by: Beatrice Lecher D on: 10/25/2022 05:28 PM   Modules accepted: Orders

## 2022-10-25 NOTE — Progress Notes (Signed)
Hi BJ, I would recommend over-the-counter Prevacid or Prilosec.  I think you may already take Prilosec but not sure if you take it every day or just as needed.  If you do have Prilosec at home I recommend taking 2 tabs which is a total of 40 mg.  That is equivalent to prescription strength.  Go ahead and take it twice a day to really suppress the acid in your stomach and small intestine just to see if that helps with the discomfort.  Colitis usually improves on its own but it may take several more days before you feel like yourself.  Continue to try to stay hydrated and eat bland foods as tolerated.  If you are not feeling better after the weekend then definitely let us know.  I know you mentioned you felt nauseated as well some to send over some Zofran to the pharmacy to.  Sent to [facility] in [place].

## 2022-10-29 DIAGNOSIS — I7782 Antineutrophilic cytoplasmic antibody (ANCA) vasculitis: Secondary | ICD-10-CM | POA: Diagnosis not present

## 2022-10-29 DIAGNOSIS — A319 Mycobacterial infection, unspecified: Secondary | ICD-10-CM | POA: Diagnosis not present

## 2022-12-01 NOTE — Progress Notes (Addendum)
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 Presented 2011 with nodular infiltrates and progressive weakness/neuropathy. Bronchoscoped 2011- Neg.  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 FVC 4.91/73%, FEV1 4.19/83%, FEV1/FVC 0.85, FEF 25-75% 0.85, TLC 78%, DLCO 113% PFT 06/08/2014-normal lung volumes, minimal slowing and small airway flows with response to bronchodilator, normal diffusion CXR 08/30/2014-Emphysematous and bronchitic changes consistent with COPD. Bronchoscopy 2019-Dr. Sood-BAL-normal flora.  Aspergillus Ag BAL 0.10 WNL, fungal cultures negative, AFB neg. cytology benign. PFT [facility], 03/10/2019 without interpretation- numerical results scanned to media. 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 Aortic Atherosclerosis,  Presented 2011 with nodular infiltrates and progressive weakness/neuropathy. Parallel f/u with Dr Patel/ Pulmonary @[facility] - agrees with conservative plan for annual CT, holding bronch searching for NTM pending progression. Advised staying off macrolides in case needed for NTM in future. -Symbicort, Albuterol hfa, neb albuterol, ipratropium 0.06% nasal Covid vax- 4  Phizer Flu vax-had Started Paxlovid for acute Covid Feb. [facility]- POS ANCA 09/18/21 Has been on CellCept.  Had a very difficult time through the month of December with back pains he associated with CellCept.  It did help his breathing.  Back pain eased when he quit CellCept.  Then had COVID infection in February, treated with Paxlovid.  Currently using Symbicort, nebulizer, rescue inhaler and benzonatate and feels chest is stable.  He points out he tends to get very sick with viral infections and wants to keep her prednisone taper available-discussed.  Currently just has dry cough. Most recent chest CT interprets the pattern as either atypical infection or his "Wegener's" without distinct progression.  Plan is to continue annual HRCT. He will talk with his primary physician about the atherosclerosis noted on CT with consideration of cardiology evaluation. HRCT 11/26/21-  IMPRESSION: 1. Widespread areas of cylindrical bronchiectasis, peripheral bronchiolectasis, thickening of the peribronchovascular interstitium and peribronchovascular micro and macronodularity. Given the patient's history of Wegener's granulomatosis, these findings are likely reflective of disease. Alternatively, similar findings could be seen in the setting of chronic indolent atypical infectious processes such as MAI (mycobacterium avium intracellulare). 2. Small sessile nodule associated with the posterior wall of the distal right mainstem bronchus. This may simply represent some retained mucus, however, the possibility of a small bronchial ulceration is not excluded given the patient's history of Wegener's granulomatosis. 3. Aortic atherosclerosis, in addition to 2 vessel coronary artery disease. Please note that although the presence of coronary artery calcium documents the presence of coronary artery disease, the severity of this disease and any potential stenosis cannot be assessed on this non-gated CT examination.  Assessment for potential risk factor modification, dietary therapy or pharmacologic therapy may be warranted, if clinically indicated. Aortic Atherosclerosis (ICD10-I70.0).  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 Aortic Atherosclerosis,  Presented 2011 with nodular infiltrates and progressive weakness/neuropathy. Induced sputum> M. Chelonae. Parallel f/u with Dr Patel/  Pulmonary @[facility] - agrees with conservative plan for annual CT, holding bronch searching for NTM pending progression. Advised staying off macrolides in case needed for NTM in future. -Symbicort, Albuterol hfa, neb albuterol, ipratropium 0.06% nasal Covid vax- 4 Phizer Flu vax-had  [facility] 10/29/22- Dr [facility] for updated CT, daily albuterol nebs, exercise [facility] 10/29/22- continues maintenance rituximab He is optimistic about Rituxan but still aware of a tendency to retain secretions.  We talked about changing from Symbicort to remove the steroid component.  We discussed adding a pneumatic vest to do more than his Flutter device can do it to help secretion clearance.  We are also going to try hypertonic nebulizer solution. Discussed ILD/ not UIP on his CT....smartvest --Patient has has daily productive cough lasting greater than 12 months requiring antibiotic therapy.  Flutter valve has been tried, but has not effectively mobilized secretions.  Considered and ruled out manual CPT as no consistent skilled caregiver available to perform therapy.  CT scan performed on 12/03/22 confirms bronchiectasis.  Starting patient on vest  therapy.  HRCT chest 12/02/22- IMPRESSION: 1. Patchy peribronchovascular nodularity, upper and midlung zone predominant, unchanged from 11/26/2021 but progressive from 03/08/2020. Given history of microscopic polyangiitis, findings are compatible with indolent progression of disease. Findings are suggestive of an alternative diagnosis (not UIP) per consensus guidelines: Diagnosis of Idiopathic Pulmonary Fibrosis: An Official ATS/ERS/JRS/ALAT Clinical Practice Guideline. [place], Iss 5, [phone], Jun 21 2017. 2. Left anterior descending coronary artery calcification, age advanced.   ROS-see HPI + = positive Constitutional:   No-   weight loss, night sweats, fevers, chills, fatigue, lassitude. HEENT:   No-  headaches, difficulty swallowing, tooth/dental problems, sore throat,       No-  sneezing, itching, +ear ache,+ nasal congestion, post nasal drip,  CV:  No-   chest pain, orthopnea, PND, swelling in lower extremities, anasarca,                                                    dizziness, palpitations Resp: shortness of breath with exertion or at rest.              No-   productive cough,  + non-productive cough,  No- coughing up of blood.              No-   change in color of mucus.  + wheezing.   Skin: No-   rash or lesions. GI:  No-   heartburn, indigestion, abdominal pain, nausea, vomiting,  GU:  MS:  +  joint pain or swelling.   Neuro-     nothing unusual Psych:  No- change in mood or affect. No depression or anxiety.  No memory loss.  OBJ- Physical Exam General- Alert, Oriented, Affect-appropriate, Distress- none acute,+ tall, thin,  + looks tired today Skin- no rash Lymphadenopathy- none Head- atraumatic            Eyes- Gross vision intact, PERRLA, conjunctivae and secretions clear            Ears- Hearing, canals-normal            Nose- + marked turbinate edema, no-Septal dev, mucus, polyps, erosion, perforation             Throat- Mallampati  II , mucosa clear , drainage- none, tonsils-  atrophic Neck- flexible , trachea midline, no stridor , thyroid nl, carotid no bruit Chest - symmetrical excursion , unlabored           Heart/CV- RRR , no murmur , no gallop  , no rub, nl s1 s2                           - JVD- none , edema- none, stasis changes- none, varices- none           Lung- + coarse / wet airway sounds, no- wheeze or fine rhonchi, unlabored,  cough-none , dullness-none, rub- none,            Chest wall-  Abd-  Br/ Gen/ Rectal- Not done, not indicated Extrem- cyanosis- none, clubbing, none, atrophy- none, strength- nl Neuro- grossly intact to observation

## 2022-12-02 ENCOUNTER — Ambulatory Visit: Payer: Commercial Managed Care - PPO

## 2022-12-02 DIAGNOSIS — M317 Microscopic polyangiitis: Secondary | ICD-10-CM

## 2022-12-02 DIAGNOSIS — I2510 Atherosclerotic heart disease of native coronary artery without angina pectoris: Secondary | ICD-10-CM | POA: Diagnosis not present

## 2022-12-02 DIAGNOSIS — R918 Other nonspecific abnormal finding of lung field: Secondary | ICD-10-CM | POA: Diagnosis not present

## 2022-12-02 DIAGNOSIS — J984 Other disorders of lung: Secondary | ICD-10-CM | POA: Diagnosis not present

## 2022-12-02 DIAGNOSIS — J84112 Idiopathic pulmonary fibrosis: Secondary | ICD-10-CM | POA: Diagnosis not present

## 2022-12-03 ENCOUNTER — Ambulatory Visit: Payer: Commercial Managed Care - PPO | Admitting: Internal Medicine

## 2022-12-03 ENCOUNTER — Other Ambulatory Visit (HOSPITAL_COMMUNITY): Payer: Self-pay

## 2022-12-03 ENCOUNTER — Encounter: Payer: Self-pay | Admitting: Internal Medicine

## 2022-12-03 VITALS — BP 120/72 | HR 68 | Ht 79.0 in | Wt 185.8 lb

## 2022-12-03 DIAGNOSIS — J479 Bronchiectasis, uncomplicated: Secondary | ICD-10-CM

## 2022-12-03 DIAGNOSIS — M317 Microscopic polyangiitis: Secondary | ICD-10-CM | POA: Diagnosis not present

## 2022-12-03 DIAGNOSIS — J45909 Unspecified asthma, uncomplicated: Secondary | ICD-10-CM

## 2022-12-03 MED ORDER — SODIUM CHLORIDE 3 % IN NEBU
3.0000 mL | INHALATION_SOLUTION | Freq: Three times a day (TID) | RESPIRATORY_TRACT | 12 refills | Status: AC
  Filled 2022-12-03 – 2022-12-04 (×2): qty 750, 17d supply, fill #0

## 2022-12-03 MED ORDER — BENZONATATE 200 MG PO CAPS
200.0000 mg | ORAL_CAPSULE | Freq: Three times a day (TID) | ORAL | 5 refills | Status: DC | PRN
  Filled 2022-12-03: qty 30, 10d supply, fill #0
  Filled 2022-12-19: qty 30, 10d supply, fill #1

## 2022-12-03 MED ORDER — ANORO ELLIPTA 62.5-25 MCG/ACT IN AEPB
1.0000 | INHALATION_SPRAY | Freq: Every day | RESPIRATORY_TRACT | 12 refills | Status: DC
  Filled 2022-12-03: qty 60, 30d supply, fill #0

## 2022-12-03 NOTE — Patient Instructions (Addendum)
Script sent for hypertonic saline nebulizer solution-  try taking a neb treatment with this as needed  Order- Pneumatic Vest      dx Bronchiectasis  Script sent to try replacing Symbicort with Bevespi inhaler    Inhale 2 puffs twice daily. This leave out the steroid.

## 2022-12-03 NOTE — Assessment & Plan Note (Signed)
Emphasis now on maximizing airway clearance. Plan- Pneumatic Vest, Hypertonic saline nebs. Refill benzonatate for occasional use if needed. He will continue to follow at [facility] as well.

## 2022-12-03 NOTE — Assessment & Plan Note (Signed)
Symbicort seems useful, but he is concerned about bones now and we discussed inhalers without steroids. Plan- try Anoro

## 2022-12-04 ENCOUNTER — Other Ambulatory Visit: Payer: Self-pay

## 2022-12-04 ENCOUNTER — Other Ambulatory Visit (HOSPITAL_COMMUNITY): Payer: Self-pay

## 2022-12-05 ENCOUNTER — Other Ambulatory Visit: Payer: Self-pay

## 2022-12-06 ENCOUNTER — Other Ambulatory Visit: Payer: Self-pay

## 2022-12-19 ENCOUNTER — Other Ambulatory Visit (HOSPITAL_COMMUNITY): Payer: Self-pay

## 2022-12-25 ENCOUNTER — Encounter: Payer: Self-pay | Admitting: Internal Medicine

## 2022-12-25 ENCOUNTER — Encounter: Payer: Self-pay | Admitting: Oncology

## 2022-12-26 ENCOUNTER — Other Ambulatory Visit: Payer: Self-pay

## 2022-12-30 ENCOUNTER — Ambulatory Visit: Payer: Commercial Managed Care - PPO | Admitting: Sports Medicine

## 2022-12-30 ENCOUNTER — Other Ambulatory Visit (HOSPITAL_COMMUNITY): Payer: Self-pay

## 2022-12-30 ENCOUNTER — Other Ambulatory Visit: Payer: Self-pay

## 2022-12-30 ENCOUNTER — Ambulatory Visit (INDEPENDENT_AMBULATORY_CARE_PROVIDER_SITE_OTHER): Payer: Commercial Managed Care - PPO

## 2022-12-30 DIAGNOSIS — M5412 Radiculopathy, cervical region: Secondary | ICD-10-CM

## 2022-12-30 DIAGNOSIS — M5416 Radiculopathy, lumbar region: Secondary | ICD-10-CM | POA: Diagnosis not present

## 2022-12-30 DIAGNOSIS — M8000XS Age-related osteoporosis with current pathological fracture, unspecified site, sequela: Secondary | ICD-10-CM | POA: Diagnosis not present

## 2022-12-30 DIAGNOSIS — M818 Other osteoporosis without current pathological fracture: Secondary | ICD-10-CM | POA: Insufficient documentation

## 2022-12-30 DIAGNOSIS — M545 Low back pain, unspecified: Secondary | ICD-10-CM | POA: Diagnosis not present

## 2022-12-30 DIAGNOSIS — M50322 Other cervical disc degeneration at C5-C6 level: Secondary | ICD-10-CM | POA: Diagnosis not present

## 2022-12-30 DIAGNOSIS — M810 Age-related osteoporosis without current pathological fracture: Secondary | ICD-10-CM | POA: Insufficient documentation

## 2022-12-30 MED ORDER — GABAPENTIN 300 MG PO CAPS
ORAL_CAPSULE | ORAL | 3 refills | Status: DC
  Filled 2022-12-30: qty 90, 8d supply, fill #0

## 2022-12-30 MED ORDER — PREDNISONE 50 MG PO TABS
50.0000 mg | ORAL_TABLET | Freq: Every day | ORAL | 0 refills | Status: AC
  Filled 2022-12-30: qty 5, 5d supply, fill #0

## 2022-12-30 MED ORDER — NAPROXEN 500 MG PO TABS
500.0000 mg | ORAL_TABLET | Freq: Two times a day (BID) | ORAL | 3 refills | Status: DC
  Filled 2022-12-30: qty 60, 30d supply, fill #0
  Filled 2023-01-24: qty 60, 30d supply, fill #1
  Filled 2023-03-17: qty 60, 30d supply, fill #2
  Filled 2023-04-12: qty 60, 30d supply, fill #3

## 2022-12-30 NOTE — Assessment & Plan Note (Signed)
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 As below we will use prednisone followed by naproxen, gabapentin at night, x-rays and formal PT. Return to see me in 6 weeks, MR for interventional planning if no better.

## 2022-12-30 NOTE — Assessment & Plan Note (Signed)
Axial back pain with radiation down the left buttock and posterior thigh, recent lumbar spine MRI was further was part unrevealing, adding 5 days of prednisone followed by naproxen, gabapentin at night, aggressive formal physical therapy and updated lumbar spine x-rays. We would get an updated lumbar spine MRI and if insufficient improvement after 6 weeks however if no areas of neuroforaminal stenosis identified we would likely get a nerve conduction and EMG.

## 2022-12-30 NOTE — Progress Notes (Signed)
    Procedures performed today:    None.  Independent interpretation of notes and tests performed by another provider:   None.  Brief History, Exam, Impression, and Recommendations:    Osteoporosis This is a pleasant 42 year old male, he does have what sounds to be mild thoracic vertebral compression fracture, he has been on steroids for a long time for Wegener's granulomatosis. Recent bone density test showed a T-score of -2.4, combined with a compression fracture I would give him a diagnosis of osteoporosis. I would like him to discuss Evenity followed by Prolia with his PCP.  Left lumbar radiculitis Axial back pain with radiation down the left buttock and posterior thigh, recent lumbar spine MRI was further was part unrevealing, adding 5 days of prednisone followed by naproxen, gabapentin at night, aggressive formal physical therapy and updated lumbar spine x-rays. We would get an updated lumbar spine MRI and if insufficient improvement after 6 weeks however if no areas of neuroforaminal stenosis identified we would likely get a nerve conduction and EMG.  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 As below we will use prednisone followed by naproxen, gabapentin at night, x-rays and formal PT. Return to see me in 6 weeks, MR for interventional planning if no better.    ____________________________________________ Gwen Her. Dianah Field, M.D., ABFM., CAQSM., AME. Primary Care and Sports Medicine [facility]  Adjunct Professor of [employer]  Risk manager

## 2022-12-30 NOTE — Assessment & Plan Note (Signed)
This is a pleasant 42 year old male, he does have what sounds to be mild thoracic vertebral compression fracture, he has been on steroids for a long time for Wegener's granulomatosis. Recent bone density test showed a T-score of -2.4, combined with a compression fracture I would give him a diagnosis of osteoporosis. I would like him to discuss Evenity followed by Prolia with his PCP.

## 2023-01-07 ENCOUNTER — Encounter: Payer: Self-pay | Admitting: Physical Therapy

## 2023-01-07 ENCOUNTER — Ambulatory Visit (INDEPENDENT_AMBULATORY_CARE_PROVIDER_SITE_OTHER): Payer: Commercial Managed Care - PPO | Admitting: Physical Therapy

## 2023-01-07 DIAGNOSIS — M542 Cervicalgia: Secondary | ICD-10-CM

## 2023-01-07 DIAGNOSIS — M6281 Muscle weakness (generalized): Secondary | ICD-10-CM

## 2023-01-07 DIAGNOSIS — M5459 Other low back pain: Secondary | ICD-10-CM | POA: Diagnosis not present

## 2023-01-07 NOTE — Therapy (Signed)
OUTPATIENT PHYSICAL THERAPY THORACOLUMBAR EVALUATION   Patient Name: Jesse ADAMIC, [name] MRN: [id number] DOB:09/06/1981, 42 y.o., male Today's Date: 01/07/2023  END OF SESSION:  PT End of Session - 01/07/23 1302     Visit Number 1    Number of Visits 16    Date for PT Re-Evaluation 03/04/23    PT Start Time [id number]    PT Stop Time 1354    PT Time Calculation (min) 51 min    Activity Tolerance Patient limited by pain             Past Medical History:  Diagnosis Date   Allergy    Arthritis    Asthma    Chronic prostatitis    Myalgia    Pulmonary infiltrates    Sinusitis, chronic    Tonsillar abscess    Vasculitis ([place])    Past Surgical History:  Procedure Laterality Date   BRONCHOSCOPY  2011   TBBX ----inflammation   IR FLUORO GUIDE CV LINE RIGHT  01/31/2017   IR FLUORO GUIDE CV MIDLINE PICC RIGHT  01/30/2017   IR US GUIDE VASC ACCESS RIGHT  01/30/2017   peritonsilar abscess     TYMPANOPLASTY     VIDEO BRONCHOSCOPY Bilateral 06/25/2018   Procedure: VIDEO BRONCHOSCOPY WITHOUT FLUORO;  Surgeon: [name], MD;  Location: [facility];  Service: Cardiopulmonary;  Laterality: Bilateral;   Patient Active Problem List   Diagnosis Date Noted   Radiculitis of left cervical region 12/30/2022   Left lumbar radiculitis 12/30/2022   Osteoporosis 12/30/2022   Coccygeal pain 06/16/2020   Nose injury, initial encounter 06/16/2020   Atherosclerosis of aorta ([place]) 04/12/2019   Pulmonary infiltrates    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/2018   Medication monitoring encounter 03/03/2018   Pulmonary nodule seen on imaging study 01/15/2018   Microscopic polyangiitis ([place]) 03/14/2016   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   Dyshidrotic eczema 04/08/2014   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   MYALGIA 06/28/2010   GERD 06/27/2010   Nonspecific (abnormal) findings on radiological and other examination of body structure 05/29/2010   CT, CHEST, ABNORMAL 05/29/2010   Asthma  with bronchitis 04/09/2010   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  Patient reports he was fine on Saturday where he went rollerskating and had no falls or issues but the next day his neck/shoulder/arm started to hurt.  Reports he played the guitar which increased his pain on this left side.  Reports he was given prednisone which did not change his symptoms and was also given gabapentin but has not started that yet.  Patient has a difficult time getting comfortable and pain is just continuous in nature.  He gets slight relief with immobilization but no relief with anything else he has tried at this time.  Pain radiates into left upper arm and at times into forearm.  Prior to this episode patient was having occasional symptoms but they would resolve after a short while.  During 1 of these episode he did have some hand tingling but has not had it since.  Patient also has a current T7 compression fracture which was found during a CT scan of his chest, reports he does have pain in his lower thoracic (points to around T12) but his neck is his biggest issue at this time. Patient is right-handed but is ambidextrous and some activities.  PERTINENT HISTORY:  Osteoporosis, Wegener's granulomatosis (long  term steroid use), psoriatic arthritis, T7 compression fracture  PAIN:  Are you having pain? Yes: NPRS scale: 6/10 Pain location: left arm and neck  Pain description: dull, continuous, weakness  Aggravating factors: R ROT  Relieving factors: keeping his neck still  PRECAUTIONS: None  WEIGHT BEARING RESTRICTIONS: No  FALLS:  Has patient fallen in last 6 months? No   OCCUPATION: nuclear physicist  PLOF: Independent  PATIENT GOALS: to have less pain and be able to play guitar     OBJECTIVE:   DIAGNOSTIC FINDINGS: 12/30/22 Xray cervical spine  FINDINGS: There is no evidence of cervical spine fracture or prevertebral soft tissue swelling. Alignment is normal. Mild endplate osteophyte formation is present at C5-C6. No significant neural foraminal stenosis bilaterally. The dens is intact and lateral masses are symmetric.   IMPRESSION: Mild degenerative changes at C5-C6.  Xray Lumbar spine FINDINGS: Alignment is anatomic. Vertebral body and disc space heights are normal. No degenerative changes. No definite pars defects.   IMPRESSION: No findings to explain the patient's pain.  CT chest 2 months ago  Musculoskeletal: Degenerative changes in the spine. Slight compression of the T7 vertebral body, unchanged. No worrisome lytic or sclerotic lesions.    COGNITION: Overall cognitive status: Within functional limits for tasks assessed          POSTURE: forward head and flat thoracic spine  PALPATION: Tenderness to palpation along spinous processes of thoracic spine      Cervical  AROM: Not formally assessed on this date all motions and cervical spine painful and limited   01/07/2023      Flexion       Extension       R ROT       L ROT       R SB      L SB      * Pain   (Blank rows = not tested)      UE Measurements not formally assessed on this date patient in too much pain Upper Extremity Right 01/07/2023 Left 01/07/2023   A/PROM MMT A/PROM MMT   Shoulder Flexion      Shoulder Extension      Shoulder Abduction  Shoulder Adduction      Shoulder Internal Rotation      Shoulder External Rotation      Elbow Flexion      Elbow Extension      Wrist Flexion      Wrist Extension      Wrist Supination      Wrist Pronation      Wrist Ulnar Deviation      Wrist Radial Deviation      Grip Strength NA  NA     (Blank rows = not tested)   * pain   SPECIAL TESTS:  Traction relief test-positive symptoms reduced Median nerve tension-negative bilaterally  FUNCTIONAL TESTS:  Bed mobilities-slow and painful with all transitional movements   TODAY'S TREATMENT:                                                                                                                              DATE:   01/07/2023  Therapeutic Exercise:  Aerobic: Supine: Prone:  Seated: box breathing - educated on how to perform   S/l:breathing in to upper thoracic region - 5 minutes Neuromuscular Re-education: Manual Therapy: s/l cervical traction - tolerated well 10 minutes  Therapeutic Activity: Self Care: Trigger Point Dry Needling:  Modalities:    PATIENT EDUCATION:  Education details: on current presentation, on HEP, on clinical outcomes score and POC, on benefits of topicals, breathing exercises, TENs, Cervical tractions device, thermotherapy, positional support in seated position, and how to reduce neural tension with hands on head, on how to perform self cervical traction Person educated: Patient Education method: Explanation, Demonstration, and Handouts Education comprehension: verbalized understanding    HOME EXERCISE PROGRAM: 3/19 box breathing/pain relief strategies [id number]  - previous with long exhale breathing   ASSESSMENT:  CLINICAL IMPRESSION: Patient presents to physical therapy today with severe neck and left upper arm pain that started about 1.5 weeks ago.  Assessment limited secondary to high levels of pain on this date.   Greatest relief felt with cervical traction which patient was educated on as well as other pain management strategies.  Discussed current pain response and importance of addressing pain  to down regulate central nervous system.  Patient severely limited in range of motion, function, sleep and overall quality of life secondary to pain and physical limitations at this time and would greatly benefit from skilled physical therapy.  OBJECTIVE IMPAIRMENTS: decreased activity tolerance, decreased ROM, decreased strength, improper body mechanics, postural dysfunction, and pain.   ACTIVITY LIMITATIONS: lifting, bending, sitting, sleeping, dressing, reach over head, and locomotion level  PARTICIPATION LIMITATIONS: community activity and occupation, playing guitar  PERSONAL FACTORS: 1-2 comorbidities: osteoporosis, vasculitis,  are also affecting patient's functional outcome.   REHAB POTENTIAL: Good  CLINICAL DECISION MAKING: Evolving/moderate complexity  EVALUATION COMPLEXITY: Moderate   GOALS: Goals reviewed with patient? yes  SHORT TERM GOALS: Target date: 02/04/2023  Patient will be independent in self management strategies to improve quality of life and  functional outcomes. Baseline: New Program Goal status: INITIAL  2.  Patient will report at least 50% improvement in overall symptoms and/or function to demonstrate improved functional mobility Baseline: 0% better Goal status: INITIAL  3.  Patient will be able to find comfortable position at night to improve ability to get comfortable and fall asleep Baseline: Difficult finding comfortable position Goal status: INITIAL  4.  Patient will demonstrate pain-free cervical range of motion in all directions Baseline: Painful Goal status: INITIAL    LONG TERM GOALS: Target date: 03/04/2023   Patient will report at least 75% improvement in overall symptoms and/or function to demonstrate improved functional mobility Baseline: 0% better Goal  status: INITIAL  2.  Patient will be able to play guitar without pain or difficulties to return to prior level of function Baseline: Painful/difficult Goal status: INITIAL  3.  Patient report no radicular symptoms in left upper extremity to improve quality of life. Baseline: Current radicular symptoms into left upper extremity Goal status: INITIAL    PLAN:  PT FREQUENCY: 2x/week  PT DURATION: 8 weeks  PLANNED INTERVENTIONS: Therapeutic exercises, Therapeutic activity, Neuromuscular re-education, Balance training, Gait training, Patient/Family education, Self Care, Joint mobilization, Joint manipulation, Stair training, Canalith repositioning, Orthotic/Fit training, DME instructions, Aquatic Therapy, Dry Needling, Electrical stimulation, Spinal manipulation, Spinal mobilization, Cryotherapy, Moist heat, Traction, Ionotophoresis 4mg /ml Dexamethasone, Manual therapy, and Re-evaluation.  PLAN FOR NEXT SESSION: Cervical traction, Pain management strategies-IFC, manual, heat/ice, cupping, breathing strategies, scapular protraction, isometrics, continue assessment of cervical spine   3:10 PM, 01/07/23 Jerene Pitch, DPT Physical Therapy with [facility]

## 2023-01-08 ENCOUNTER — Ambulatory Visit: Payer: Commercial Managed Care - PPO | Admitting: Family Medicine

## 2023-01-08 ENCOUNTER — Encounter: Payer: Self-pay | Admitting: Family Medicine

## 2023-01-08 ENCOUNTER — Ambulatory Visit (INDEPENDENT_AMBULATORY_CARE_PROVIDER_SITE_OTHER): Payer: Commercial Managed Care - PPO

## 2023-01-08 VITALS — BP 120/69 | HR 69 | Resp 20 | Ht 79.0 in | Wt 191.0 lb

## 2023-01-08 DIAGNOSIS — N5089 Other specified disorders of the male genital organs: Secondary | ICD-10-CM | POA: Diagnosis not present

## 2023-01-08 DIAGNOSIS — N5082 Scrotal pain: Secondary | ICD-10-CM | POA: Diagnosis not present

## 2023-01-08 DIAGNOSIS — N433 Hydrocele, unspecified: Secondary | ICD-10-CM | POA: Diagnosis not present

## 2023-01-08 HISTORY — DX: Scrotal pain: N50.82

## 2023-01-08 MED ORDER — LEVOFLOXACIN 500 MG PO TABS: 500.0000 mg | ORAL_TABLET | Freq: Every day | ORAL | 0 refills | Status: AC

## 2023-01-08 NOTE — Assessment & Plan Note (Addendum)
-   Right testicular swelling with sudden onset - Will go ahead and obtain a scrotal ultrasound with Doppler - Differential includes hydrocele, varicocele, epididymitis or less likely testicular torsion - Patient will go downstairs right after exam for ultrasound stat and we will call with the results. - US shows R hydrocele however with pain I believe it likely we could be dealing with but did a mitis.  I will go ahead and treat with Levaquin 500 mg for 10 days

## 2023-01-08 NOTE — Progress Notes (Signed)
Acute Office Visit  Subjective:     Patient ID: Jesse Glazier, [name], male    DOB: [birth year], 42 y.o.   MRN: [id number]  Chief Complaint  Patient presents with   SWOLLEN TESTICLES    HPI Patient is in today for testicular swelling.  Noticed R testicle started swelling 4 days ago.  Does admit to pain in addition to swelling.  The only abnormality he notes is that he started prednisone a few days prior to presentation.  He does not think that this is related but that is the only change to his activities.  Denies any trauma to the area.  Nuys fever or chills.  Review of Systems  Constitutional:  Negative for chills and fever.  Respiratory:  Negative for cough and shortness of breath.   Cardiovascular:  Negative for chest pain.  Genitourinary:        Testicular swelling  Neurological:  Negative for headaches.        Objective:    BP 120/69 (BP Location: Right Arm, Cuff Size: Normal)   Pulse 69   Resp 20   Ht 6\' 7"  (2.007 m)   Wt 191 lb (86.6 kg)   SpO2 98%   BMI 21.52 kg/m    Physical Exam Vitals and nursing note reviewed.  Constitutional:      General: He is not in acute distress.    Appearance: Normal appearance.  HENT:     Head: Normocephalic and atraumatic.     Right Ear: External ear normal.     Left Ear: External ear normal.     Nose: Nose normal.  Eyes:     Conjunctiva/sclera: Conjunctivae normal.  Cardiovascular:     Rate and Rhythm: Normal rate and regular rhythm.  Pulmonary:     Effort: Pulmonary effort is normal.     Breath sounds: Normal breath sounds.  Genitourinary:    Comments: Chaperone present during exam.  Right scrotal sac swelling with erythema noted.  Some tenderness to palpation. Neurological:     General: No focal deficit present.     Mental Status: He is alert and oriented to person, place, and time.  Psychiatric:        Mood and Affect: Mood normal.        Behavior: Behavior normal.        Thought Content: Thought content  normal.        Judgment: Judgment normal.     No results found for any visits on 01/08/23.      Assessment & Plan:   Problem List Items Addressed This Visit       Other   Testicular swelling, right - Primary    - Right testicular swelling with sudden onset - Will go ahead and obtain a scrotal ultrasound with Doppler - Differential includes hydrocele, varicocele, epididymitis or less likely testicular torsion - Patient will go downstairs right after exam for ultrasound stat and we will call with the results. - US shows R hydrocele however with pain I believe it likely we could be dealing with but did a mitis.  I will go ahead and treat with Levaquin 500 mg for 10 days      Relevant Orders   US SCROTUM W/DOPPLER (Completed)   Scrotal pain   Relevant Orders   US SCROTUM W/DOPPLER (Completed)    Meds ordered this encounter  Medications   levofloxacin (LEVAQUIN) 500 MG tablet    Sig: Take 1 tablet (500 mg total) by mouth daily  for 10 days.    Dispense:  10 tablet    Refill:  0    Return if symptoms worsen or fail to improve.  Owens Loffler, DO

## 2023-01-09 ENCOUNTER — Other Ambulatory Visit: Payer: Commercial Managed Care - PPO

## 2023-01-09 ENCOUNTER — Encounter: Payer: Self-pay | Admitting: Physical Therapy

## 2023-01-09 ENCOUNTER — Ambulatory Visit (INDEPENDENT_AMBULATORY_CARE_PROVIDER_SITE_OTHER): Payer: Commercial Managed Care - PPO | Admitting: Physical Therapy

## 2023-01-09 DIAGNOSIS — M5459 Other low back pain: Secondary | ICD-10-CM | POA: Diagnosis not present

## 2023-01-09 DIAGNOSIS — M6281 Muscle weakness (generalized): Secondary | ICD-10-CM

## 2023-01-09 DIAGNOSIS — M542 Cervicalgia: Secondary | ICD-10-CM | POA: Diagnosis not present

## 2023-01-09 NOTE — Therapy (Signed)
OUTPATIENT PHYSICAL THERAPY TREATMENT NOTE   Patient Name: Jesse BARSH, [name] MRN: [id number] DOB:08/27/1981, 42 y.o., male Today's Date: 01/09/2023  PCP: Hali Marry, MD   REFERRING PROVIDER: Silverio Decamp, MD  END OF SESSION:   PT End of Session - 01/09/23 1425     Visit Number 2    Number of Visits 16    Date for PT Re-Evaluation 03/04/23    PT Start Time 1430    PT Stop Time 1518    PT Time Calculation (min) 48 min    Activity Tolerance Patient limited by pain             Past Medical History:  Diagnosis Date   Allergy    Arthritis    Asthma    Chronic prostatitis    Myalgia    Pulmonary infiltrates    Sinusitis, chronic    Tonsillar abscess    Vasculitis ([place])    Past Surgical History:  Procedure Laterality Date   BRONCHOSCOPY  2011   TBBX ----inflammation   IR FLUORO GUIDE CV LINE RIGHT  01/31/2017   IR FLUORO GUIDE CV MIDLINE PICC RIGHT  01/30/2017   IR US GUIDE VASC ACCESS RIGHT  01/30/2017   peritonsilar abscess     TYMPANOPLASTY     VIDEO BRONCHOSCOPY Bilateral 06/25/2018   Procedure: VIDEO BRONCHOSCOPY WITHOUT FLUORO;  Surgeon: [name], MD;  Location: [facility];  Service: Cardiopulmonary;  Laterality: Bilateral;   Patient Active Problem List   Diagnosis Date Noted   Testicular swelling, right 01/08/2023   Scrotal pain 01/08/2023   Radiculitis of left cervical region 12/30/2022   Left lumbar radiculitis 12/30/2022   Osteoporosis 12/30/2022   Long term current use of systemic steroids 05/17/2021   Closed fracture of nasal bones 06/19/2020   Coccygeal pain 06/16/2020   Nose injury, initial encounter 06/16/2020   Atherosclerosis of aorta ([place]) 04/12/2019   Pulmonary infiltrates    Medication monitoring encounter 03/03/2018   Pulmonary nodule seen on imaging study 01/15/2018   Microscopic polyangiitis ([place]) 03/14/2016   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   Dyshidrotic eczema 04/08/2014    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   MYALGIA 06/28/2010   GERD 06/27/2010   Nonspecific (abnormal) findings on radiological and other examination of body structure 05/29/2010   CT, CHEST, ABNORMAL 05/29/2010   Asthma with bronchitis 04/09/2010   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 States that some times felt after. Hanging his head helps.   Eval: Patient reports intense neck pain that started 1 and half weeks ago.  Patient reports he was fine on Saturday where he went rollerskating and had no falls or issues but the next day his neck/shoulder/arm started to hurt.  Reports he played the guitar which increased his pain on this left side.  Reports he was given prednisone which did not change his symptoms and was also given gabapentin but has not started that yet.  Patient has a difficult time getting comfortable and pain is just continuous in nature.  He gets slight relief with immobilization but no relief with anything else he has tried at this time.   Pain radiates into left upper arm and at times into forearm.  Prior to this episode patient was having occasional symptoms but they would resolve after a short while.  During 1 of these episode he did have some hand tingling but has not had it since.   Patient also has a current T7 compression fracture which was found during a CT scan of his chest, reports he does have pain in his lower  thoracic (points to around T12) but his neck is his biggest issue at this time. Patient is right-handed but is ambidextrous and some activities.   PERTINENT HISTORY:  Osteoporosis, Wegener's granulomatosis (long term steroid use), psoriatic arthritis, T7 compression fracture   PAIN:  Are you having pain? Yes: NPRS scale: 5/10 Pain location: left arm and neck  Pain description: dull, continuous, weakness  Aggravating factors: R ROT  Relieving factors: keeping his neck still   PRECAUTIONS: None   WEIGHT BEARING RESTRICTIONS: No   FALLS:  Has patient fallen in last 6 months? No     OCCUPATION: nuclear physicist   PLOF: Independent   PATIENT GOALS: to have less pain and be able to play guitar       OBJECTIVE:    DIAGNOSTIC FINDINGS: 12/30/22 Xray cervical spine  FINDINGS: There is no evidence of cervical spine fracture or prevertebral soft tissue swelling. Alignment is normal. Mild endplate osteophyte formation is present at C5-C6. No significant neural foraminal stenosis bilaterally. The dens is intact and lateral masses are symmetric.   IMPRESSION: Mild degenerative changes at C5-C6.   Xray Lumbar spine FINDINGS: Alignment is anatomic. Vertebral body and disc space heights are normal. No degenerative changes. No definite pars defects.   IMPRESSION: No findings to explain the patient's pain.   CT chest 2 months ago  Musculoskeletal: Degenerative changes in the spine. Slight compression of the T7 vertebral body, unchanged. No worrisome lytic or sclerotic lesions.     COGNITION: Overall cognitive status: Within functional limits for tasks assessed                                  POSTURE: forward head and flat thoracic spine   PALPATION: Tenderness to palpation along spinous processes of thoracic spine                  Cervical  AROM: Not formally assessed on this date all motions and cervical spine painful and limited   01/07/2023      Flexion        Extension       R ROT  L ROT       R SB      L SB                            * Pain              (Blank rows = not tested)                  UE Measurements not formally assessed on this date patient in too much pain       Upper Extremity Right 01/07/2023 Left 01/07/2023    A/PROM MMT A/PROM MMT  Shoulder Flexion          Shoulder Extension          Shoulder Abduction          Shoulder Adduction          Shoulder Internal Rotation          Shoulder External Rotation          Elbow Flexion          Elbow Extension          Wrist Flexion          Wrist Extension          Wrist Supination          Wrist Pronation          Wrist Ulnar Deviation          Wrist Radial Deviation          Grip Strength NA   NA                          (Blank rows = not tested)                       * pain     SPECIAL TESTS:  Traction relief test-positive symptoms reduced Median nerve tension-negative bilaterally   FUNCTIONAL TESTS:  Bed mobilities-slow and painful with all transitional movements     TODAY'S TREATMENT:                                                                                                                              DATE:   01/09/2023   Therapeutic Exercise:    Aerobic: standing: L stretch at counter with fingers hooked in sink - 5 minutes -felt good with head hanging down, educated on how to use cervical traction device. Prone: laying over ball - not tolerated well. Cat of cat/cow - tolerated well - 6 minutes laying over 2 pillows right neck ROT - tolerated well but had some spasms     Seated: box breathing  - reviewed Neuromuscular Re-education: Manual Therapy: medial glides to C7/T1 grade II, PA to ribs 3-8 B grade II - tolerated well, STM to thoracic paraspinals/rhomboids, Traps - tolerated well,  Therapeutic Activity: Self Care: Trigger Point Dry Needling:  Modalities:      PATIENT EDUCATION:  Education details: on anatomy, on proper use of  cervical traction device, on possible causes of spasms/pain, on positional relief (right cervical rotation, gentle C7 self mobilization (into right rotation) Person educated: Patient Education method: Explanation, Demonstration, and Handouts Education comprehension: verbalized understanding       HOME EXERCISE PROGRAM: 3/19 box breathing/pain relief strategies [id number]  - previous with long exhale breathing     ASSESSMENT:   CLINICAL IMPRESSION: 01/09/2023 Patient tolerated manual work better on this date. Relief noted with decompression of upper thoracic spine and mobilization to C7/T1. Improved rib and upper thoracic mobility noted after mobilization. Did not mobilize T6/7 SP as patient with compression fracture at T7. Overall significant improvement in symptoms noted end of session reporting 1/10. Right cervical rotation and cervical flexion tolerated best. Will continue with current POC as tolerated.   Eval: Patient presents to physical therapy today with severe neck and left upper arm pain that started about 1.5 weeks ago.  Assessment limited secondary to high levels of pain on this date.  Greatest relief felt with cervical traction which patient was educated on as well as other pain management strategies.  Discussed current pain response and importance of addressing pain  to down regulate central nervous system.  Patient severely limited in range of motion, function, sleep and overall quality of life secondary to pain and physical limitations at this time and would greatly benefit from skilled physical therapy.   OBJECTIVE IMPAIRMENTS: decreased activity tolerance, decreased ROM, decreased strength, improper body mechanics, postural dysfunction, and pain.    ACTIVITY LIMITATIONS: lifting, bending, sitting, sleeping, dressing, reach over head, and locomotion level   PARTICIPATION LIMITATIONS: community activity and occupation, playing guitar   PERSONAL FACTORS: 1-2 comorbidities:  osteoporosis, vasculitis,  are also affecting patient's functional outcome.    REHAB POTENTIAL: Good   CLINICAL DECISION MAKING: Evolving/moderate complexity   EVALUATION COMPLEXITY: Moderate     GOALS: Goals reviewed with patient? yes   SHORT TERM GOALS: Target date: 02/04/2023  Patient will be independent in self management strategies to improve quality of life and functional outcomes. Baseline: New Program Goal status: INITIAL   2.  Patient will report at least 50% improvement in overall symptoms and/or function to demonstrate improved functional mobility Baseline: 0% better Goal status: INITIAL   3.  Patient will be able to find comfortable position at night to improve ability to get comfortable and fall asleep Baseline: Difficult finding comfortable position Goal status: INITIAL   4.  Patient will demonstrate pain-free cervical range of motion in all directions Baseline: Painful Goal status: INITIAL       LONG TERM GOALS: Target date: 03/04/2023    Patient will report at least 75% improvement in overall symptoms and/or function to demonstrate improved functional mobility Baseline: 0% better Goal status: INITIAL   2.  Patient will be able to play guitar without pain or difficulties to return to prior level of function Baseline: Painful/difficult Goal status: INITIAL   3.  Patient report no radicular symptoms in left upper extremity to improve quality of life. Baseline: Current radicular symptoms into left upper extremity Goal status: INITIAL       PLAN:   PT FREQUENCY: 2x/week   PT DURATION: 8 weeks   PLANNED INTERVENTIONS: Therapeutic exercises, Therapeutic activity, Neuromuscular re-education, Balance training, Gait training, Patient/Family education, Self Care, Joint mobilization, Joint manipulation, Stair training, Canalith repositioning, Orthotic/Fit training,  DME instructions, Aquatic Therapy, Dry Needling, Electrical stimulation, Spinal manipulation,  Spinal mobilization, Cryotherapy, Moist heat, Traction, Ionotophoresis 4mg /ml Dexamethasone, Manual therapy, and Re-evaluation.   PLAN FOR NEXT SESSION: Cervical traction, Pain management strategies-IFC, manual, heat/ice, cupping, breathing strategies, scapular protraction, isometrics, continue assessment of cervical spine     3:42 PM, 01/09/23 Jerene Pitch, DPT Physical Therapy with [facility]

## 2023-01-14 ENCOUNTER — Encounter: Payer: Self-pay | Admitting: Physical Therapy

## 2023-01-14 ENCOUNTER — Ambulatory Visit (INDEPENDENT_AMBULATORY_CARE_PROVIDER_SITE_OTHER): Payer: Commercial Managed Care - PPO | Admitting: Physical Therapy

## 2023-01-14 DIAGNOSIS — M542 Cervicalgia: Secondary | ICD-10-CM

## 2023-01-14 DIAGNOSIS — M5459 Other low back pain: Secondary | ICD-10-CM

## 2023-01-14 DIAGNOSIS — M6281 Muscle weakness (generalized): Secondary | ICD-10-CM

## 2023-01-14 NOTE — Therapy (Addendum)
OUTPATIENT PHYSICAL THERAPY TREATMENT NOTE   Patient Name: Jesse ZUNK, [name] MRN: [id number] DOB:08/01/1981, 42 y.o., male Today's Date: 01/14/2023  PCP: Hali Marry, MD   REFERRING PROVIDER: Silverio Decamp, MD  END OF SESSION:   PT End of Session - 01/14/23 1519     Visit Number 3    Number of Visits 16    Date for PT Re-Evaluation 03/04/23    PT Start Time 1519    PT Stop Time 1620    PT Time Calculation (min) 61 min    Activity Tolerance Patient limited by pain             Past Medical History:  Diagnosis Date   Allergy    Arthritis    Asthma    Chronic prostatitis    Myalgia    Pulmonary infiltrates    Sinusitis, chronic    Tonsillar abscess    Vasculitis ([place])    Past Surgical History:  Procedure Laterality Date   BRONCHOSCOPY  2011   TBBX ----inflammation   IR FLUORO GUIDE CV LINE RIGHT  01/31/2017   IR FLUORO GUIDE CV MIDLINE PICC RIGHT  01/30/2017   IR US GUIDE VASC ACCESS RIGHT  01/30/2017   peritonsilar abscess     TYMPANOPLASTY     VIDEO BRONCHOSCOPY Bilateral 06/25/2018   Procedure: VIDEO BRONCHOSCOPY WITHOUT FLUORO;  Surgeon: [name], MD;  Location: [facility];  Service: Cardiopulmonary;  Laterality: Bilateral;   Patient Active Problem List   Diagnosis Date Noted   Testicular swelling, right 01/08/2023   Scrotal pain 01/08/2023   Radiculitis of left cervical region 12/30/2022   Left lumbar radiculitis 12/30/2022   Osteoporosis 12/30/2022   Long term current use of systemic steroids 05/17/2021   Closed fracture of nasal bones 06/19/2020   Coccygeal pain 06/16/2020   Nose injury, initial encounter 06/16/2020   Atherosclerosis of aorta ([place]) 04/12/2019   Pulmonary infiltrates    Medication monitoring encounter 03/03/2018   Pulmonary nodule seen on imaging study 01/15/2018   Microscopic polyangiitis ([place]) 03/14/2016   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   Dyshidrotic eczema 04/08/2014    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 States that he felt better that day and night after last session. States that he has been using the traction device is helping a little. States he used his neck pillow and it felt pretty good and had zero arm pain.  Reports that he has had a bad weekend in regards to pain despite feeling pretty good right now.  He has also noticed some stiffness in his ear in the morning and is concerned that this is related to his recent intake of gabapentin.   Eval: Patient reports intense neck pain that started 1 and half weeks ago.  Patient reports he was fine on Saturday where he went rollerskating and had no falls or issues but the next day his neck/shoulder/arm started to hurt.  Reports he played the guitar which increased his pain on this left side.  Reports he was given prednisone which did not change his symptoms and was also given gabapentin but has not started that yet.  Patient has a difficult time getting comfortable and pain is just continuous in nature.  He gets slight relief with immobilization but no relief with anything else he has tried at this time.   Pain radiates into left upper arm and at  times into forearm.  Prior to this episode patient was having occasional symptoms but they would resolve after a short while.  During 1 of these episode he did have some hand tingling but has not had it since.   Patient also has a current T7 compression fracture which was found during a CT scan of his chest, reports he does have pain in his lower thoracic (points to around T12) but his neck is his biggest issue at this time. Patient is right-handed but is ambidextrous and some activities.   PERTINENT HISTORY:  Osteoporosis, Wegener's granulomatosis (long term steroid use), psoriatic arthritis, T7 compression fracture   PAIN:  Are you having pain? Yes: NPRS scale: 3/10 Pain location: left arm and neck  Pain description: dull, continuous, weakness  Aggravating factors: R ROT  Relieving factors: keeping his neck still   PRECAUTIONS: None   WEIGHT BEARING RESTRICTIONS: No   FALLS:  Has patient fallen in last 6 months? No     OCCUPATION: nuclear physicist   PLOF: Independent   PATIENT GOALS: to have less pain and be able to play guitar       OBJECTIVE:    DIAGNOSTIC FINDINGS: 12/30/22 Xray cervical spine  FINDINGS: There is no evidence of cervical spine fracture or prevertebral soft tissue swelling. Alignment is normal. Mild endplate osteophyte formation is present at C5-C6. No significant neural foraminal stenosis bilaterally. The dens is intact and lateral masses are symmetric.   IMPRESSION: Mild degenerative changes at C5-C6.   Xray Lumbar spine FINDINGS: Alignment is anatomic. Vertebral body and disc space heights are normal. No degenerative changes. No definite pars defects.   IMPRESSION: No findings to explain the patient's pain.   CT chest 2 months ago  Musculoskeletal: Degenerative changes in the spine. Slight compression of the T7 vertebral body, unchanged. No worrisome lytic or sclerotic lesions.     COGNITION: Overall cognitive status: Within  functional limits for tasks assessed                                  POSTURE: forward head and flat thoracic spine   PALPATION: Tenderness to palpation  along spinous processes of thoracic spine                  Cervical  AROM: Not formally assessed on this date all motions and cervical spine painful and limited   01/07/2023      Flexion       Extension       R ROT       L ROT       R SB      L SB                            * Pain              (Blank rows = not tested)                  UE Measurements not formally assessed on this date patient in too much pain       Upper Extremity Right 01/07/2023 Left 01/07/2023    A/PROM MMT A/PROM MMT  Shoulder Flexion          Shoulder Extension          Shoulder Abduction          Shoulder Adduction          Shoulder Internal Rotation          Shoulder External Rotation          Elbow Flexion          Elbow Extension          Wrist Flexion          Wrist Extension          Wrist Supination          Wrist Pronation          Wrist Ulnar Deviation          Wrist Radial Deviation          Grip Strength NA   NA                          (Blank rows = not tested)                       * pain     SPECIAL TESTS:  Traction relief test-positive symptoms reduced Median nerve tension-negative bilaterally   FUNCTIONAL TESTS:  Bed mobilities-slow and painful with all transitional movements     TODAY'S TREATMENT:                                                                                                                              DATE:   01/14/2023   Therapeutic Exercise:    Aerobic: standing:  at wall - scapular protraction -not tolerated well  Prone:  laying over 2 pillows right neck ROT - breathing in this position with  and without cups on Neuromuscular Re-education: Manual Therapy: medial glides to C7/T1 grade II, facial release to upper/mid thoracic paraspinals and rhomboids/trap muscles - performed bilaterally but  focused on R side. Trigger point release to levator scapula R - tolerated moderately well. Cupping to upper thoracic paraspinals.  Therapeutic Activity: Self Care: Trigger Point Dry Needling:  Modalities:      PATIENT EDUCATION:  Education details: on pain management strategies - use of cupping (and how to safely use cups), infrared sauna/deep heat, use of cervical collar, positional relief, use of recliner at night to improve sleeping posture/tolerance. F/u with MD about continued - possible benefits of injection (if warranted), compression fracture rehab/strategies for healing and pain management. F/u with MD about concerns about gabapentin- see if stopping medication changes hearing symptoms (pt just started medication) Person educated: Patient Education method: Explanation, Demonstration, and Handouts Education comprehension: verbalized understanding       HOME EXERCISE PROGRAM: 3/19 box breathing/pain relief strategies [id number]  - previous with long exhale breathing     ASSESSMENT:   CLINICAL IMPRESSION: 01/14/2023 Session continue to focus on pain management strategies and educating patient on rationale behind interventions as well as pros and cons of different pain management interventions.  Discussed possible benefits for deep heat like use of infrared sauna, positional relief strategies and sleeping postures/positions, and use of cupping or fascial massage to help with current symptoms.  Patient inquired about inversion table, discussed how full inversion would most likely be painful due to the muscle spasms but slight inversion at about 30 degrees may be beneficial at this time.  Instructed patient to follow-up with MD in regards to continued high levels of pain and possible pain presenting from thoracic compression fracture.  Tolerated fascial release well reporting no pain end of session.  Overall patient is improving but does continue to have bouts of high levels of pain, will  continue to monitor and focus on pain management strategies at this time.  Eval: Patient presents to physical therapy today with severe neck and left upper arm pain that started about 1.5 weeks ago.  Assessment limited secondary to high levels of pain on this date.  Greatest relief felt with cervical traction which patient was educated on as well as other pain management strategies.  Discussed current pain response and importance of addressing pain  to down regulate central nervous system.  Patient severely limited in range of motion, function, sleep and overall quality of life secondary to pain and physical limitations at this time and would greatly benefit from skilled physical therapy.   OBJECTIVE IMPAIRMENTS: decreased activity tolerance, decreased ROM, decreased strength, improper body mechanics, postural dysfunction, and pain.    ACTIVITY LIMITATIONS: lifting, bending, sitting, sleeping, dressing, reach over head, and locomotion level   PARTICIPATION LIMITATIONS: community activity and occupation, playing guitar   PERSONAL FACTORS: 1-2 comorbidities: osteoporosis, vasculitis,  are also affecting patient's functional outcome.    REHAB POTENTIAL: Good   CLINICAL DECISION MAKING: Evolving/moderate complexity   EVALUATION COMPLEXITY: Moderate     GOALS: Goals reviewed with patient? yes   SHORT TERM GOALS: Target date: 02/04/2023  Patient will be independent in self management strategies to improve quality of life and functional outcomes. Baseline: New Program Goal status: INITIAL   2.  Patient will report at least 50% improvement in overall symptoms and/or function to demonstrate improved functional mobility Baseline: 0% better Goal status: INITIAL   3.  Patient will be able to find comfortable position at night to improve  ability to get comfortable and fall asleep Baseline: Difficult finding comfortable position Goal status: INITIAL   4.  Patient will demonstrate pain-free  cervical range of motion in all directions Baseline: Painful Goal status: INITIAL       LONG TERM GOALS: Target date: 03/04/2023    Patient will report at least 75% improvement in overall symptoms and/or function to demonstrate improved functional mobility Baseline: 0% better Goal status: INITIAL   2.  Patient will be able to play guitar without pain or difficulties to return to prior level of function Baseline: Painful/difficult Goal status: INITIAL   3.  Patient report no radicular symptoms in left upper extremity to improve quality of life. Baseline: Current radicular symptoms into left upper extremity Goal status: INITIAL       PLAN:   PT FREQUENCY: 2x/week   PT DURATION: 8 weeks   PLANNED INTERVENTIONS: Therapeutic exercises, Therapeutic activity, Neuromuscular re-education, Balance training, Gait training, Patient/Family education, Self Care, Joint mobilization, Joint manipulation, Stair training, Canalith repositioning, Orthotic/Fit training, DME instructions, Aquatic Therapy, Dry Needling, Electrical stimulation, Spinal manipulation, Spinal mobilization, Cryotherapy, Moist heat, Traction, Ionotophoresis 4mg /ml Dexamethasone, Manual therapy, and Re-evaluation.   PLAN FOR NEXT SESSION: Cervical traction, Pain management strategies-IFC, manual, heat/ice, cupping, breathing strategies, scapular protraction, isometrics, continue assessment of cervical spine     4:39 PM, 01/14/23 Jerene Pitch, DPT Physical Therapy with [facility]

## 2023-01-15 ENCOUNTER — Other Ambulatory Visit: Payer: Self-pay | Admitting: Family Medicine

## 2023-01-15 ENCOUNTER — Encounter: Payer: Self-pay | Admitting: Family Medicine

## 2023-01-15 DIAGNOSIS — N5089 Other specified disorders of the male genital organs: Secondary | ICD-10-CM

## 2023-01-16 ENCOUNTER — Encounter: Payer: Self-pay | Admitting: Physical Therapy

## 2023-01-16 ENCOUNTER — Ambulatory Visit (INDEPENDENT_AMBULATORY_CARE_PROVIDER_SITE_OTHER): Payer: Commercial Managed Care - PPO | Admitting: Physical Therapy

## 2023-01-16 DIAGNOSIS — M6281 Muscle weakness (generalized): Secondary | ICD-10-CM

## 2023-01-16 DIAGNOSIS — M542 Cervicalgia: Secondary | ICD-10-CM | POA: Diagnosis not present

## 2023-01-16 DIAGNOSIS — M5459 Other low back pain: Secondary | ICD-10-CM

## 2023-01-16 NOTE — Therapy (Signed)
OUTPATIENT PHYSICAL THERAPY TREATMENT NOTE   Patient Name: Jesse GARONE, [name] MRN: [id number] DOB:[birth year], 42 y.o., male Today's Date: 01/16/2023  PCP: Hali Marry, MD   REFERRING PROVIDER: Silverio Decamp, MD  END OF SESSION:   PT End of Session - 01/16/23 1017     Visit Number 4    Number of Visits 16    Date for PT Re-Evaluation 03/04/23    PT Start Time 1018    PT Stop Time 1100    PT Time Calculation (min) 42 min    Activity Tolerance Patient limited by pain             Past Medical History:  Diagnosis Date   Allergy    Arthritis    Asthma    Chronic prostatitis    Myalgia    Pulmonary infiltrates    Sinusitis, chronic    Tonsillar abscess    Vasculitis ([place])    Past Surgical History:  Procedure Laterality Date   BRONCHOSCOPY  2011   TBBX ----inflammation   IR FLUORO GUIDE CV LINE RIGHT  01/31/2017   IR FLUORO GUIDE CV MIDLINE PICC RIGHT  01/30/2017   IR US GUIDE VASC ACCESS RIGHT  01/30/2017   peritonsilar abscess     TYMPANOPLASTY     VIDEO BRONCHOSCOPY Bilateral 06/25/2018   Procedure: VIDEO BRONCHOSCOPY WITHOUT FLUORO;  Surgeon: [name], MD;  Location: [facility];  Service: Cardiopulmonary;  Laterality: Bilateral;   Patient Active Problem List   Diagnosis Date Noted   Testicular swelling, right 01/08/2023   Scrotal pain 01/08/2023   Radiculitis of left cervical region 12/30/2022   Left lumbar radiculitis 12/30/2022   Osteoporosis 12/30/2022   Long term current use of systemic steroids 05/17/2021   Closed fracture of nasal bones 06/19/2020   Coccygeal pain 06/16/2020   Nose injury, initial encounter 06/16/2020   Atherosclerosis of aorta ([place]) 04/12/2019   Pulmonary infiltrates    Medication monitoring encounter 03/03/2018   Pulmonary nodule seen on imaging study 01/15/2018   Microscopic polyangiitis ([place]) 03/14/2016   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   Dyshidrotic eczema 04/08/2014    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 States he feels better. States stilll has trouble sleeping. Feels more mobile.    Eval: Patient reports intense neck pain that started 1 and half weeks ago.  Patient reports he was fine on Saturday where he went rollerskating and had no falls or issues but the next day his neck/shoulder/arm started to hurt.  Reports he played the guitar which increased his pain on this left side.  Reports he was given prednisone which did not change his symptoms and was also given gabapentin but has not started that yet.  Patient has a difficult time getting comfortable and pain is just continuous in nature.  He gets slight relief with immobilization but no relief with anything else he has tried at this time.   Pain radiates into left upper arm and at times into forearm.  Prior to this episode patient was having occasional symptoms but they would resolve after a short while.  During 1 of these episode he did have some hand tingling but has not had it since.   Patient also has a current T7 compression fracture which was found during a CT scan of his chest, reports he does have pain in his lower thoracic  (points to around T12) but his neck is his biggest issue at this time. Patient is right-handed but is ambidextrous and some activities.   PERTINENT HISTORY:  Osteoporosis, Wegener's granulomatosis (long term steroid use), psoriatic arthritis, T7 compression fracture   PAIN:  Are you having pain? Yes: NPRS scale: 1-2/10 Pain location: left arm and neck  Pain description: dull, continuous, weakness  Aggravating factors: R ROT  Relieving factors: keeping his neck still   PRECAUTIONS: None   WEIGHT BEARING RESTRICTIONS: No   FALLS:  Has patient fallen in last 6 months? No     OCCUPATION: nuclear physicist   PLOF: Independent   PATIENT GOALS: to have less pain and be able to play guitar       OBJECTIVE:    DIAGNOSTIC FINDINGS: 12/30/22 Xray cervical spine  FINDINGS: There is no evidence of cervical spine fracture or prevertebral soft tissue swelling. Alignment is normal. Mild endplate osteophyte formation is present at C5-C6. No significant neural foraminal stenosis bilaterally. The dens is intact and lateral masses are symmetric.   IMPRESSION: Mild degenerative changes at C5-C6.   Xray Lumbar spine FINDINGS: Alignment is anatomic. Vertebral body and disc space heights are normal. No degenerative changes. No definite pars defects.   IMPRESSION: No findings to explain the patient's pain.   CT chest 2 months ago  Musculoskeletal: Degenerative changes in the spine. Slight compression of the T7 vertebral body, unchanged. No worrisome lytic or sclerotic lesions.     COGNITION: Overall cognitive status: Within functional limits for tasks assessed                                  POSTURE: forward head and flat thoracic spine   PALPATION: Tenderness to palpation along spinous processes of thoracic spine                  Cervical  AROM: Not formally assessed on this date all motions and cervical spine painful and limited   01/07/2023      Flexion        Extension       R ROT       L ROT  R SB      L SB                            * Pain              (Blank rows = not tested)                  UE Measurements not formally assessed on this date patient in too much pain       Upper Extremity Right 01/07/2023 Left 01/07/2023    A/PROM MMT A/PROM MMT  Shoulder Flexion          Shoulder Extension          Shoulder Abduction          Shoulder Adduction          Shoulder Internal Rotation          Shoulder External Rotation          Elbow Flexion          Elbow Extension          Wrist Flexion          Wrist Extension          Wrist Supination          Wrist Pronation          Wrist Ulnar Deviation          Wrist Radial Deviation          Grip Strength NA   NA                          (Blank rows = not tested)                       * pain     SPECIAL TESTS:  Traction relief test-positive symptoms reduced Median nerve tension-negative bilaterally   FUNCTIONAL TESTS:  Bed mobilities-slow and painful with all transitional movements     TODAY'S TREATMENT:                                                                                                                              DATE:   01/16/2023   Therapeutic Exercise:    Aerobic: standing:   Prone:  laying over 2 pillows right neck ROT -mini plank with lower rib left 6 minutes total, shoulder at 90 degrees abduction internal/external rotation 5 minutes each side, scapular protraction with chin tuck 5 minutes Neuromuscular Re-education: Manual Therapy: STM to left infraspinatus and left external rotators/internal rotators of shoulder.  Tolerated mildly well very point tender Therapeutic Activity: Self Care: Trigger Point Dry Needling:  Modalities:      PATIENT EDUCATION:  Education details: on lymphatic anatomy as well as how to perform lymphatic massage in lower extremities to groin region secondary to pain/inflammation.  On overall  anatomy and current findings and  restrictions to shoulder motion Person educated: Patient Education method: Explanation, Demonstration, and Handouts Education comprehension: verbalized understanding       HOME EXERCISE PROGRAM: 3/19 box breathing/pain relief strategies [id number]  - previous with long exhale breathing     ASSESSMENT:   CLINICAL IMPRESSION: 01/16/2023 Patient with reduced pain compared to previous sessions.  Focused on shoulder internal and external rotation which was limited bilaterally but more on the left side.  Muscle spasms and trigger points noted throughout left shoulder girdle specifically in his infraspinatus which did not tolerate manual work well.  Answered all questions and added strengthening exercises which did not flare up mid to upper back symptoms but did cause soreness around T12.  Educated patient in generalized lymphatic massage secondary to increased swelling and pain reported in genital region.  Overall patient is doing well and would continue to benefit from skilled physical therapy at this time.  Eval: Patient presents to physical therapy today with severe neck and left upper arm pain that started about 1.5 weeks ago.  Assessment limited secondary to high levels of pain on this date.  Greatest relief felt with cervical traction which patient was educated on as well as other pain management strategies.  Discussed current pain response and importance of addressing pain  to down regulate central nervous system.  Patient severely limited in range of motion, function, sleep and overall quality of life secondary to pain and physical limitations at this time and would greatly benefit from skilled physical therapy.   OBJECTIVE IMPAIRMENTS: decreased activity tolerance, decreased ROM, decreased strength, improper body mechanics, postural dysfunction, and pain.    ACTIVITY LIMITATIONS: lifting, bending, sitting, sleeping, dressing, reach over head, and locomotion level   PARTICIPATION LIMITATIONS:  community activity and occupation, playing guitar   PERSONAL FACTORS: 1-2 comorbidities: osteoporosis, vasculitis,  are also affecting patient's functional outcome.    REHAB POTENTIAL: Good   CLINICAL DECISION MAKING: Evolving/moderate complexity   EVALUATION COMPLEXITY: Moderate     GOALS: Goals reviewed with patient? yes   SHORT TERM GOALS: Target date: 02/04/2023  Patient will be independent in self management strategies to improve quality of life and functional outcomes. Baseline: New Program Goal status: INITIAL   2.  Patient will report at least 50% improvement in overall symptoms and/or function to demonstrate improved functional mobility Baseline: 0% better Goal status: INITIAL   3.  Patient will be able to find comfortable position at night to improve ability to get comfortable and fall asleep Baseline: Difficult finding comfortable position Goal status: INITIAL   4.  Patient will demonstrate pain-free cervical range of motion in all directions Baseline: Painful Goal status: INITIAL       LONG TERM GOALS: Target date: 03/04/2023    Patient will report at least 75% improvement in overall symptoms and/or function to demonstrate improved functional mobility Baseline: 0% better Goal status: INITIAL   2.  Patient will be able to play guitar without pain or difficulties to return to prior level of function Baseline: Painful/difficult Goal status: INITIAL   3.  Patient report no radicular symptoms in left upper extremity to improve quality of life. Baseline: Current radicular symptoms into left upper extremity Goal status: INITIAL       PLAN:   PT FREQUENCY: 2x/week   PT DURATION: 8 weeks   PLANNED INTERVENTIONS: Therapeutic exercises, Therapeutic activity, Neuromuscular re-education, Balance training, Gait training, Patient/Family education, Self Care, Joint mobilization, Joint manipulation, Stair training, Canalith repositioning, Orthotic/Fit  training, DME  instructions, Aquatic Therapy, Dry Needling, Electrical stimulation, Spinal manipulation, Spinal mobilization, Cryotherapy, Moist heat, Traction, Ionotophoresis 4mg /ml Dexamethasone, Manual therapy, and Re-evaluation.   PLAN FOR NEXT SESSION: Cervical traction, Pain management strategies-IFC, manual, heat/ice, cupping, breathing strategies, scapular protraction, isometrics, continue assessment of cervical spine     1:06 PM, 01/16/23 Jerene Pitch, DPT Physical Therapy with [facility]

## 2023-01-20 ENCOUNTER — Ambulatory Visit (INDEPENDENT_AMBULATORY_CARE_PROVIDER_SITE_OTHER): Payer: Commercial Managed Care - PPO | Admitting: Physical Therapy

## 2023-01-20 ENCOUNTER — Ambulatory Visit: Payer: Commercial Managed Care - PPO | Admitting: Urology

## 2023-01-20 ENCOUNTER — Encounter: Payer: Self-pay | Admitting: Urology

## 2023-01-20 ENCOUNTER — Encounter: Payer: Self-pay | Admitting: Physical Therapy

## 2023-01-20 VITALS — BP 125/85 | HR 71 | Ht 79.0 in | Wt 185.0 lb

## 2023-01-20 DIAGNOSIS — Z87438 Personal history of other diseases of male genital organs: Secondary | ICD-10-CM

## 2023-01-20 DIAGNOSIS — N5089 Other specified disorders of the male genital organs: Secondary | ICD-10-CM

## 2023-01-20 DIAGNOSIS — M6281 Muscle weakness (generalized): Secondary | ICD-10-CM

## 2023-01-20 DIAGNOSIS — M5459 Other low back pain: Secondary | ICD-10-CM | POA: Diagnosis not present

## 2023-01-20 DIAGNOSIS — M542 Cervicalgia: Secondary | ICD-10-CM | POA: Diagnosis not present

## 2023-01-20 LAB — URINALYSIS, ROUTINE W REFLEX MICROSCOPIC
Bilirubin, UA: NEGATIVE
Glucose, UA: NEGATIVE
Ketones, UA: NEGATIVE
Leukocytes,UA: NEGATIVE
Nitrite, UA: NEGATIVE
Protein,UA: NEGATIVE
RBC, UA: NEGATIVE
Specific Gravity, UA: 1.03 (ref 1.005–1.030)
Urobilinogen, Ur: 0.2 mg/dL (ref 0.2–1.0)
pH, UA: 6 (ref 5.0–7.5)

## 2023-01-20 NOTE — Progress Notes (Signed)
Assessment: 1. Testicular swelling      Plan: The patient's right testis discomfort has resolved as has most of the swelling.  Today I did discuss with him his apparent history consistent with chronic pelvic syndrome and the possibility for recurrent episodes of scrotal discomfort and conservative measures during flareups. Patient will return as needed.  Chief Complaint: Right testis swelling and discomfort  History of Present Illness:  Jesse Glazier, [name] is a 42 y.o. male who is seen in consultation from Hali Marry, MD for evaluation of testicular swelling. Patient has a past medical history significant for autoimmune (MPO-ANCA vasculitis) Prior urologic history is remarkable for apparently passing a kidney stone a few years previously and also for having episodes of "prostatitis" when he was in his late teens and early 46s.  Patient continues to report occasional pain associated with ejaculation also suggestive of chronic pelvic pain syndrome.  Patient presented to his primary care physician on 01/08/2023 complaining of a several day history of right testicular swelling and discomfort. Patient underwent scrotal ultrasound with Doppler 12/2022--see full report below. IMPRESSION: There is no evidence of testicular torsion. There is homogeneous echogenicity in both testes.   Moderate right hydrocele. There are few right epididymal cysts.  He was treated empirically with a 10-day course of Levaquin. The patient states that his pain has resolved and swelling has also improved substantially.   Past Medical History:  Past Medical History:  Diagnosis Date   Allergy    Arthritis    Asthma    Chronic prostatitis    Myalgia    Pulmonary infiltrates    Sinusitis, chronic    Tonsillar abscess    Vasculitis ([place])     Past Surgical History:  Past Surgical History:  Procedure Laterality Date   BRONCHOSCOPY  2011   TBBX ----inflammation   IR FLUORO GUIDE CV LINE  RIGHT  01/31/2017   IR FLUORO GUIDE CV MIDLINE PICC RIGHT  01/30/2017   IR US GUIDE VASC ACCESS RIGHT  01/30/2017   peritonsilar abscess     TYMPANOPLASTY     VIDEO BRONCHOSCOPY Bilateral 06/25/2018   Procedure: VIDEO BRONCHOSCOPY WITHOUT FLUORO;  Surgeon: [name], MD;  Location: [facility];  Service: Cardiopulmonary;  Laterality: Bilateral;    Allergies:  Allergies  Allergen Reactions   Lipitor [Atorvastatin] Other (See Comments)    myalgias   Ruxience [Rituximab-Pvvr] Itching    Itching in back of throat and nose     Family History:  Family History  Problem Relation Age of Onset   Heart attack Other        grandmother   Hyperlipidemia Father    Thyroid disease Father        hashimoto's thyroidistis   Heart disease Paternal Grandfather    Colon cancer Neg Hx    Stomach cancer Neg Hx    Rectal cancer Neg Hx    Esophageal cancer Neg Hx     Social History:  Social History   Tobacco Use   Smoking status: Never   Smokeless tobacco: Never  Vaping Use   Vaping Use: Never used  Substance Use Topics   Alcohol use: Yes    Comment: occasional   Drug use: No    Review of symptoms:  Constitutional:  Negative for unexplained weight loss, night sweats, fever, chills ENT:  Negative for nose bleeds, sinus pain, painful swallowing CV:  Negative for chest pain, shortness of breath, exercise intolerance, palpitations, loss of consciousness Resp:  Negative for  cough, wheezing, shortness of breath GI:  Negative for nausea, vomiting, diarrhea, bloody stools GU:  Positives noted in HPI; otherwise negative for gross hematuria, dysuria, urinary incontinence Neuro:  Negative for seizures, poor balance, limb weakness, slurred speech Psych:  Negative for lack of energy, depression, anxiety Endocrine:  Negative for polydipsia, polyuria, symptoms of hypoglycemia (dizziness, hunger, sweating) Hematologic:  Negative for anemia, purpura, petechia, prolonged or excessive bleeding, use of  anticoagulants  Allergic:  Negative for difficulty breathing or choking as a result of exposure to anything; no shellfish allergy; no allergic response (rash/itch) to materials, foods  Physical exam: BP 125/85   Pulse 71   Ht 6\' 7"  (2.007 m)   Wt 185 lb (83.9 kg)   BMI 20.84 kg/m  GENERAL APPEARANCE:  Well appearing, well developed, well nourished, NAD  GU: Normal phallus; normal left testis and cord without evidence of hernia Right testis palpable with mild hydrocele.  Small epididymal head cyst.  No evidence of hernia.  Results: Scrotal ultrasound 01/08/2023 CLINICAL DATA:  Right scrotal pain and swelling   EXAM: SCROTAL ULTRASOUND   DOPPLER ULTRASOUND OF THE TESTICLES   TECHNIQUE: Complete ultrasound examination of the testicles, epididymis, and other scrotal structures was performed. Color and spectral Doppler ultrasound were also utilized to evaluate blood flow to the testicles.   COMPARISON:  None Available.   FINDINGS: Right testicle   Measurements: 4.4 x 3.2 x 3.3 cm. No mass or microlithiasis visualized within the testis.There is a 5 mm smooth marginated echogenic structure in the margin of the right testis, possibly appendix testis.   Left testicle   Measurements: 4.1 x 2.5 x 3.2 cm. No mass or microlithiasis visualized.   Right epididymis: There are few anechoic structures measuring up to 1.4 cm in size suggesting epididymal cysts.   Left epididymis:  Normal in size and appearance.   Hydrocele:  There is moderate right hydrocele.   Varicocele:  None visualized.   Pulsed Doppler interrogation of both testes demonstrates normal low resistance arterial and venous waveforms bilaterally.   IMPRESSION: There is no evidence of testicular torsion. There is homogeneous echogenicity in both testes.   Moderate right hydrocele. There are few right epididymal cysts.     Electronically Signed   By: Elmer Picker M.D.   On: 01/08/2023 14:14

## 2023-01-20 NOTE — Therapy (Signed)
OUTPATIENT PHYSICAL THERAPY TREATMENT NOTE   Patient Name: Jesse ROTMAN, [name] MRN: [id number] DOB:1981/10/16, 42 y.o., male Today's Date: 01/20/2023  PCP: Hali Marry, MD   REFERRING PROVIDER: Silverio Decamp, MD  END OF SESSION:   PT End of Session - 01/20/23 1131     Visit Number 5    Number of Visits 16    Date for PT Re-Evaluation 03/04/23    PT Start Time 1130    PT Stop Time 1225    PT Time Calculation (min) 55 min    Activity Tolerance Patient limited by pain             Past Medical History:  Diagnosis Date   Allergy    Arthritis    Asthma    Chronic prostatitis    Myalgia    Pulmonary infiltrates    Sinusitis, chronic    Tonsillar abscess    Vasculitis    Past Surgical History:  Procedure Laterality Date   BRONCHOSCOPY  2011   TBBX ----inflammation   IR FLUORO GUIDE CV LINE RIGHT  01/31/2017   IR FLUORO GUIDE CV MIDLINE PICC RIGHT  01/30/2017   IR US GUIDE VASC ACCESS RIGHT  01/30/2017   peritonsilar abscess     TYMPANOPLASTY     VIDEO BRONCHOSCOPY Bilateral 06/25/2018   Procedure: VIDEO BRONCHOSCOPY WITHOUT FLUORO;  Surgeon: [name], MD;  Location: [facility];  Service: Cardiopulmonary;  Laterality: Bilateral;   Patient Active Problem List   Diagnosis Date Noted   Testicular swelling, right 01/08/2023   Scrotal pain 01/08/2023   Radiculitis of left cervical region 12/30/2022   Left lumbar radiculitis 12/30/2022   Osteoporosis 12/30/2022   Long term current use of systemic steroids 05/17/2021   Closed fracture of nasal bones 06/19/2020   Coccygeal pain 06/16/2020   Nose injury, initial encounter 06/16/2020   Atherosclerosis of aorta 04/12/2019   Pulmonary infiltrates    Medication monitoring encounter 03/03/2018   Pulmonary nodule seen on imaging study 01/15/2018   Microscopic polyangiitis 03/14/2016   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   Dyshidrotic eczema 04/08/2014   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   MYALGIA 06/28/2010   GERD 06/27/2010   Nonspecific (abnormal) findings on radiological and other examination of body structure 05/29/2010   CT, CHEST, ABNORMAL 05/29/2010   Asthma with bronchitis 04/09/2010   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 States that he has been doing his exercises and has been not trying to do too much. States that Saturday was really bad pain wise but immobilizing himself helped a lot.  Swelling and testicular pain completely resolved after lymphatic massage.   Eval: Patient reports intense neck pain that started 1 and half weeks ago.  Patient reports he was fine on Saturday where he went rollerskating and had no falls or issues but the next day his neck/shoulder/arm started to hurt.  Reports he played the guitar which increased his pain on this left side.  Reports he was given prednisone which did not change his symptoms and was also given gabapentin but has not started that yet.  Patient has a difficult time getting comfortable and pain is just continuous in nature.  He gets slight relief with immobilization but no relief with anything else he has tried at this time.   Pain radiates into left upper arm and at times into forearm.  Prior to this episode patient was having occasional symptoms but they would resolve after a short while.  During 1 of these episode he did have some hand tingling but  has not had it since.   Patient also has a current T7 compression fracture which was found during a CT scan of his chest, reports he does have pain in his lower thoracic (points to around T12) but his neck is his biggest issue at this time. Patient is right-handed but is ambidextrous and some activities.   PERTINENT HISTORY:  Osteoporosis, Wegener's granulomatosis (long term steroid use), psoriatic arthritis, T7 compression fracture   PAIN:  Are you having pain? Yes: NPRS scale: 2/10 Pain location: left arm Pain description: dull, continuous, weakness  Aggravating factors: R ROT  Relieving factors: keeping his neck still   PRECAUTIONS: None   WEIGHT BEARING RESTRICTIONS: No   FALLS:  Has patient fallen in last 6 months? No     OCCUPATION: nuclear physicist   PLOF: Independent   PATIENT GOALS: to have less pain and be able to play guitar       OBJECTIVE:    DIAGNOSTIC FINDINGS: 12/30/22 Xray cervical spine  FINDINGS: There is no evidence of cervical spine fracture or prevertebral soft tissue swelling. Alignment is normal. Mild endplate osteophyte formation is present at C5-C6. No significant neural foraminal stenosis bilaterally. The dens is intact and lateral masses are symmetric.   IMPRESSION: Mild degenerative changes at C5-C6.   Xray Lumbar spine FINDINGS: Alignment is anatomic. Vertebral body and disc space heights are normal. No degenerative changes. No definite pars defects.   IMPRESSION: No findings to explain the patient's pain.   CT chest 2 months ago  Musculoskeletal: Degenerative changes in the spine. Slight compression of the T7 vertebral body, unchanged. No worrisome lytic or sclerotic lesions.     COGNITION: Overall cognitive status: Within functional limits for tasks assessed                                  POSTURE: forward head and flat thoracic spine   PALPATION: Tenderness to palpation along spinous processes of thoracic spine                   Cervical  AROM: Not formally assessed on this date all motions and cervical spine painful and  limited   01/07/2023      Flexion       Extension       R ROT       L ROT       R SB      L SB                            * Pain              (Blank rows = not tested)                  UE Measurements not formally assessed on this date patient in too much pain       Upper Extremity Right 01/07/2023 Left 01/07/2023    A/PROM MMT A/PROM MMT  Shoulder Flexion          Shoulder Extension          Shoulder Abduction          Shoulder Adduction          Shoulder Internal Rotation          Shoulder External Rotation          Elbow Flexion          Elbow Extension          Wrist Flexion          Wrist Extension          Wrist Supination          Wrist Pronation          Wrist Ulnar Deviation          Wrist Radial Deviation          Grip Strength NA   NA                          (Blank rows = not tested)                       * pain     SPECIAL TESTS:  Traction relief test-positive symptoms reduced Median nerve tension-negative bilaterally   FUNCTIONAL TESTS:  Bed mobilities-slow and painful with all transitional movements     TODAY'S TREATMENT:                                                                                                                              DATE:   01/20/2023   Therapeutic Exercise:    Aerobic: seated:  scapular protraction and retraction - not tolerated well, self mobilization inferior glide to left shoulder x15  Prone:  laying over 2 pillows right neck ROT PROM/AAROM left arm IR/ER - tolerated well after traction of left shoulder Neuromuscular Re-education: Manual Therapy: STM to left infraspinatus and left external rotators/internal rotators of shoulder.  STM to left levator scapula, inferior mobilization to left  shoulder grade II/III - tolerated well  Therapeutic Activity: Self Care:   Trigger Point Dry-Needling  Treatment  instructions: Expect mild to moderate muscle soreness. S/S of pneumothorax if dry needled over a lung field, and to seek immediate medical attention should they occur. Patient verbalized understanding of these instructions and education.  Patient Consent Given: Yes Education handout provided: No Muscles treated: left deltoid Electrical stimulation performed: No Parameters: N/A Treatment response/outcome: muscle twitch - reduced muscle tension noted   Modalities: Thermotherapy to thoracic spine patient prone over 2 pillows-performed end of session 10 minutes-not billed for     PATIENT EDUCATION:  Education details: On benefits and risks associated with dry needling, answered all questions and concerns, reviewed benefits of heat/sauna/pool well out of town Person educated: Patient Education method: Consulting civil engineer, Media planner, and Handouts Education comprehension: verbalized understanding       HOME EXERCISE PROGRAM: 3/19 box breathing/pain relief strategies [id number]  - previous with long exhale breathing     ASSESSMENT:   CLINICAL IMPRESSION: 01/20/2023 Overall patient with reduced pain levels compared to previous sessions but continues to have muscle fasciculations with minimal motions and palpation of muscles.  Trial dry needling in left upper extremity today but did not perform in lung fields secondary to patient's long history.  Muscle twitch response noted with dry needling and generalized aching during/after session.  Continues to tolerate laying over 2 pillows best.  Concerns with upcoming trip noted and will continue to assess and determine possible seated exercises that patient tolerates well driving/as a passenger while driving to [place] this upcoming week..  Patient will continue to benefit from skilled physical therapy at this time.  Eval: Patient presents to physical therapy today with severe neck and left upper arm pain that started about 1.5 weeks ago.  Assessment limited  secondary to high levels of pain on this date.  Greatest relief felt with cervical traction which patient was educated on as well as other pain management strategies.  Discussed current pain response and importance of addressing pain  to down regulate central nervous system.  Patient severely limited in range of motion, function, sleep and overall quality of life secondary to pain and physical limitations at this time and would greatly benefit from skilled physical therapy.   OBJECTIVE IMPAIRMENTS: decreased activity tolerance, decreased ROM, decreased strength, improper body mechanics, postural dysfunction, and pain.    ACTIVITY LIMITATIONS: lifting, bending, sitting, sleeping, dressing, reach over head, and locomotion level   PARTICIPATION LIMITATIONS: community activity and occupation, playing guitar   PERSONAL FACTORS: 1-2 comorbidities: osteoporosis, vasculitis,  are also affecting patient's functional outcome.    REHAB POTENTIAL: Good   CLINICAL DECISION MAKING: Evolving/moderate complexity   EVALUATION COMPLEXITY: Moderate     GOALS: Goals reviewed with patient? yes   SHORT TERM GOALS: Target date: 02/04/2023  Patient will be independent in self management strategies to improve quality of life and functional outcomes. Baseline: New Program Goal status: INITIAL   2.  Patient will report at least 50% improvement in overall symptoms and/or function to demonstrate improved functional mobility Baseline: 0% better Goal status: INITIAL   3.  Patient will be able to find comfortable position at night to improve ability to get comfortable and fall asleep Baseline: Difficult finding comfortable position Goal status: INITIAL   4.  Patient will demonstrate pain-free cervical range of motion in all directions Baseline: Painful Goal status: INITIAL       LONG TERM GOALS: Target date: 03/04/2023    Patient will  report at least 75% improvement in overall symptoms and/or function to  demonstrate improved functional mobility Baseline: 0% better Goal status: INITIAL   2.  Patient will be able to play guitar without pain or difficulties to return to prior level of function Baseline: Painful/difficult Goal status: INITIAL   3.  Patient report no radicular symptoms in left upper extremity to improve quality of life. Baseline: Current radicular symptoms into left upper extremity Goal status: INITIAL       PLAN:   PT FREQUENCY: 2x/week   PT DURATION: 8 weeks   PLANNED INTERVENTIONS: Therapeutic exercises, Therapeutic activity, Neuromuscular re-education, Balance training, Gait training, Patient/Family education, Self Care, Joint mobilization, Joint manipulation, Stair training, Canalith repositioning, Orthotic/Fit training, DME instructions, Aquatic Therapy, Dry Needling, Electrical stimulation, Spinal manipulation, Spinal mobilization, Cryotherapy, Moist heat, Traction, Ionotophoresis 4mg /ml Dexamethasone, Manual therapy, and Re-evaluation.   PLAN FOR NEXT SESSION: seated exercises for upcoming trip? Cervical traction, Pain management strategies-IFC, manual, heat/ice, cupping, breathing strategies, scapular protraction, isometrics, continue assessment of cervical spine     2:29 PM, 01/20/23 Jerene Pitch, DPT Physical Therapy with [facility]

## 2023-01-21 ENCOUNTER — Ambulatory Visit (INDEPENDENT_AMBULATORY_CARE_PROVIDER_SITE_OTHER): Payer: Commercial Managed Care - PPO | Admitting: Physical Therapy

## 2023-01-21 ENCOUNTER — Encounter: Payer: Self-pay | Admitting: Physical Therapy

## 2023-01-21 DIAGNOSIS — M5459 Other low back pain: Secondary | ICD-10-CM | POA: Diagnosis not present

## 2023-01-21 DIAGNOSIS — M6281 Muscle weakness (generalized): Secondary | ICD-10-CM

## 2023-01-21 DIAGNOSIS — M542 Cervicalgia: Secondary | ICD-10-CM | POA: Diagnosis not present

## 2023-01-21 NOTE — Therapy (Signed)
OUTPATIENT PHYSICAL THERAPY TREATMENT NOTE   Patient Name: Jesse RATHGEBER, [name] MRN: [id number] DOB:[birth year], 42 y.o., male Today's Date: 01/21/2023  PCP: Hali Marry, MD   REFERRING PROVIDER: Silverio Decamp, MD  END OF SESSION:   PT End of Session - 01/21/23 1517     Visit Number 6    Number of Visits 16    Date for PT Re-Evaluation 03/04/23    PT Start Time [id number]    PT Stop Time 1608    PT Time Calculation (min) 51 min    Activity Tolerance Patient limited by pain             Past Medical History:  Diagnosis Date   Allergy    Arthritis    Asthma    Chronic prostatitis    Myalgia    Pulmonary infiltrates    Sinusitis, chronic    Tonsillar abscess    Vasculitis    Past Surgical History:  Procedure Laterality Date   BRONCHOSCOPY  2011   TBBX ----inflammation   IR FLUORO GUIDE CV LINE RIGHT  01/31/2017   IR FLUORO GUIDE CV MIDLINE PICC RIGHT  01/30/2017   IR US GUIDE VASC ACCESS RIGHT  01/30/2017   peritonsilar abscess     TYMPANOPLASTY     VIDEO BRONCHOSCOPY Bilateral 06/25/2018   Procedure: VIDEO BRONCHOSCOPY WITHOUT FLUORO;  Surgeon: [name], MD;  Location: [facility];  Service: Cardiopulmonary;  Laterality: Bilateral;   Patient Active Problem List   Diagnosis Date Noted   Testicular swelling, right 01/08/2023   Scrotal pain 01/08/2023   Radiculitis of left cervical region 12/30/2022   Left lumbar radiculitis 12/30/2022   Osteoporosis 12/30/2022   Long term current use of systemic steroids 05/17/2021   Closed fracture of nasal bones 06/19/2020   Coccygeal pain 06/16/2020   Nose injury, initial encounter 06/16/2020   Atherosclerosis of aorta 04/12/2019   Pulmonary infiltrates    Medication monitoring encounter 03/03/2018   Pulmonary nodule seen on imaging study 01/15/2018   Microscopic polyangiitis 03/14/2016   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   Dyshidrotic eczema 04/08/2014   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   MYALGIA 06/28/2010   GERD 06/27/2010   Nonspecific (abnormal) findings on radiological and other examination of body structure 05/29/2010   CT, CHEST, ABNORMAL 05/29/2010   Asthma with bronchitis 04/09/2010   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 States he had some numbness in his hand 2nd and 3rd fingers. States that he is having less pain  in his left arm. States that his neck was pulsing a little bit.   Eval: Patient reports intense neck pain that started 1 and half weeks ago.  Patient reports he was fine on Saturday where he went rollerskating and had no falls or issues but the next day his neck/shoulder/arm started to hurt.  Reports he played the guitar which increased his pain on this left side.  Reports he was given prednisone which did not change his symptoms and was also given gabapentin but has not started that yet.  Patient has a difficult time getting comfortable and pain is just continuous in nature.  He gets slight relief with immobilization but no relief with anything else he has tried at this time.   Pain radiates into left upper arm and at times into forearm.  Prior to this episode patient was having occasional symptoms but they would resolve after a short while.  During 1 of these episode he did have some hand tingling but has not had it since.   Patient also has a current T7 compression  fracture which was found during a CT scan of his chest, reports he does have pain in his lower thoracic (points to around T12) but his neck is his biggest issue at this time. Patient is right-handed but is ambidextrous and some activities.   PERTINENT HISTORY:  Osteoporosis, Wegener's granulomatosis (long term steroid use), psoriatic arthritis, T7 compression fracture   PAIN:  Are you having pain? Yes: NPRS scale: 2/10 Pain location: left arm Pain description: dull, continuous, weakness  Aggravating factors: R ROT  Relieving factors: keeping his neck still   PRECAUTIONS: None   WEIGHT BEARING RESTRICTIONS: No   FALLS:  Has patient fallen in last 6 months? No     OCCUPATION: nuclear physicist   PLOF: Independent   PATIENT GOALS: to have less pain and be able to play guitar       OBJECTIVE:    DIAGNOSTIC FINDINGS: 12/30/22 Xray cervical spine  FINDINGS: There is no evidence of cervical spine fracture or prevertebral soft tissue swelling. Alignment is normal. Mild endplate osteophyte formation is present at C5-C6. No significant neural foraminal stenosis bilaterally. The dens is intact and lateral masses are symmetric.   IMPRESSION: Mild degenerative changes at C5-C6.   Xray Lumbar spine FINDINGS: Alignment is anatomic. Vertebral body and disc space heights are normal. No degenerative changes. No definite pars defects.   IMPRESSION: No findings to explain the patient's pain.   CT chest 2 months ago  Musculoskeletal: Degenerative changes in the spine. Slight compression of the T7 vertebral body, unchanged. No worrisome lytic or sclerotic lesions.     COGNITION: Overall cognitive status: Within functional limits for tasks assessed                                  POSTURE: forward head and flat thoracic spine   PALPATION: Tenderness to palpation along spinous processes of thoracic spine                  Cervical  AROM: Not formally assessed on this  date all motions and cervical spine painful and limited   01/07/2023  Flexion       Extension       R ROT       L ROT       R SB      L SB                            * Pain              (Blank rows = not tested)                  UE Measurements not formally assessed on this date patient in too much pain       Upper Extremity Right 01/07/2023 Left 01/07/2023    A/PROM MMT A/PROM MMT  Shoulder Flexion          Shoulder Extension          Shoulder Abduction          Shoulder Adduction          Shoulder Internal Rotation          Shoulder External Rotation          Elbow Flexion          Elbow Extension          Wrist Flexion          Wrist Extension          Wrist Supination          Wrist Pronation          Wrist Ulnar Deviation          Wrist Radial Deviation          Grip Strength NA   NA                          (Blank rows = not tested)                       * pain     SPECIAL TESTS:  Traction relief test-positive symptoms reduced Median nerve tension-negative bilaterally   FUNCTIONAL TESTS:  Bed mobilities-slow and painful with all transitional movements     TODAY'S TREATMENT:                                                                                                                              DATE:   01/21/2023   Therapeutic Exercise:    Aerobic: seated: nerve glides sciatic x10 B, piriformis stretch x1 B 15" holds, TRA activation on long exhale x5 5" holds  Prone:  laying over 2 pillows right neck ROT not tolerated well  Neuromuscular Re-education: Manual Therapy: STM to left infraspinatus and left external rotators/internal rotators of shoulder.  STM to left levator scapula, Vibration to posterior left shoulder muscles - tolerated well - performed supine and seated with cushion, skin rolling ot  lumbar fascia -  Therapeutic Activity: Self Care:   Modalities:    PATIENT EDUCATION:  Education details: On feel good exercises, making a plan for  feel good things while on road/vacation Person educated: Patient Education method: Explanation, Demonstration, and Handouts Education comprehension: verbalized understanding       HOME EXERCISE PROGRAM: 3/19 box breathing/pain relief strategies [id number]  - previous with long exhale breathing     ASSESSMENT:   CLINICAL IMPRESSION: 01/21/2023 Patient continues to have pain and muscle spasms. Tolerated vibration well with reduced pain noted. Did not tolerate skin rolling well during but discomfort localized and resolved after skin rolling. Reminded patient of feel good exercises/position/modalities while on vacation/on the road. No pain noted end of session. Patient will continue to benefit form skilled PT at this time.   Eval: Patient presents to physical therapy today with severe neck and left upper arm pain that started about 1.5 weeks ago.  Assessment limited secondary to high levels of pain on this date.  Greatest relief felt with cervical traction which patient was educated on as well as other pain management strategies.  Discussed current pain response and importance of addressing pain  to down regulate central nervous system.  Patient severely limited in range of motion, function, sleep and overall quality of life secondary to pain and physical limitations at this time and would greatly benefit from skilled physical therapy.   OBJECTIVE IMPAIRMENTS: decreased activity tolerance, decreased ROM, decreased strength, improper body mechanics, postural dysfunction, and pain.    ACTIVITY LIMITATIONS: lifting, bending, sitting, sleeping, dressing, reach over head, and locomotion level   PARTICIPATION LIMITATIONS: community activity and occupation, playing guitar   PERSONAL FACTORS: 1-2 comorbidities: osteoporosis, vasculitis,  are also affecting patient's functional outcome.    REHAB POTENTIAL: Good   CLINICAL DECISION MAKING: Evolving/moderate complexity   EVALUATION COMPLEXITY:  Moderate     GOALS: Goals reviewed with patient? yes   SHORT TERM GOALS: Target date: 02/04/2023  Patient will be independent in self management strategies to improve quality of life and functional outcomes. Baseline: New Program Goal status: INITIAL   2.  Patient will report at least 50% improvement in overall symptoms and/or function to demonstrate improved functional mobility Baseline: 0% better Goal status: INITIAL   3.  Patient will be able to find comfortable position at night to improve ability to get comfortable and fall asleep Baseline: Difficult finding comfortable position Goal status: INITIAL   4.  Patient will demonstrate pain-free cervical range of motion in all directions Baseline: Painful Goal status: INITIAL       LONG TERM GOALS: Target date: 03/04/2023    Patient will report at least 75% improvement in overall symptoms and/or function to demonstrate improved functional mobility Baseline: 0% better Goal status: INITIAL   2.  Patient will be able to play guitar without pain or difficulties to return to prior level of function Baseline: Painful/difficult Goal status: INITIAL   3.  Patient report no radicular symptoms in left upper extremity to improve quality of life. Baseline: Current radicular symptoms into left upper extremity Goal status: INITIAL       PLAN:   PT FREQUENCY: 2x/week   PT DURATION: 8 weeks   PLANNED INTERVENTIONS: Therapeutic exercises, Therapeutic activity, Neuromuscular re-education, Balance training, Gait training, Patient/Family education, Self Care, Joint mobilization, Joint manipulation, Stair training, Canalith repositioning, Orthotic/Fit training, DME instructions, Aquatic Therapy, Dry Needling, Electrical stimulation, Spinal manipulation, Spinal mobilization, Cryotherapy, Moist heat, Traction, Ionotophoresis 4mg /ml Dexamethasone, Manual therapy, and Re-evaluation.  PLAN FOR NEXT SESSION:  vibration therapy, skin roling,  seated exercises for upcoming trip? Cervical traction, Pain management strategies-IFC, manual, heat/ice, cupping, breathing strategies, scapular protraction, isometrics, continue assessment of cervical spine     4:21 PM, 01/21/23 Jerene Pitch, DPT Physical Therapy with [facility]

## 2023-01-23 ENCOUNTER — Ambulatory Visit: Payer: Commercial Managed Care - PPO | Admitting: Family Medicine

## 2023-01-24 ENCOUNTER — Encounter: Payer: Self-pay | Admitting: Oncology

## 2023-01-24 ENCOUNTER — Telehealth: Payer: Commercial Managed Care - PPO | Admitting: Sports Medicine

## 2023-01-24 ENCOUNTER — Encounter: Payer: Self-pay | Admitting: Internal Medicine

## 2023-01-24 ENCOUNTER — Other Ambulatory Visit (HOSPITAL_COMMUNITY): Payer: Self-pay

## 2023-01-28 ENCOUNTER — Other Ambulatory Visit: Payer: Self-pay

## 2023-01-28 ENCOUNTER — Encounter: Payer: Self-pay | Admitting: Physical Therapy

## 2023-01-28 ENCOUNTER — Ambulatory Visit (INDEPENDENT_AMBULATORY_CARE_PROVIDER_SITE_OTHER): Payer: Commercial Managed Care - PPO | Admitting: Physical Therapy

## 2023-01-28 ENCOUNTER — Telehealth (INDEPENDENT_AMBULATORY_CARE_PROVIDER_SITE_OTHER): Payer: Commercial Managed Care - PPO | Admitting: Sports Medicine

## 2023-01-28 ENCOUNTER — Other Ambulatory Visit (HOSPITAL_COMMUNITY): Payer: Self-pay

## 2023-01-28 DIAGNOSIS — M5412 Radiculopathy, cervical region: Secondary | ICD-10-CM

## 2023-01-28 DIAGNOSIS — M6281 Muscle weakness (generalized): Secondary | ICD-10-CM

## 2023-01-28 DIAGNOSIS — M5459 Other low back pain: Secondary | ICD-10-CM

## 2023-01-28 DIAGNOSIS — M542 Cervicalgia: Secondary | ICD-10-CM

## 2023-01-28 MED ORDER — PREGABALIN 50 MG PO CAPS
ORAL_CAPSULE | ORAL | 3 refills | Status: DC
  Filled 2023-01-28: qty 90, 37d supply, fill #0
  Filled 2023-03-17: qty 90, 30d supply, fill #1
  Filled 2023-09-30: qty 90, 30d supply, fill #2
  Filled 2023-10-28: qty 90, 37d supply, fill #3

## 2023-01-28 NOTE — Progress Notes (Addendum)
   Virtual Visit via WebEx/MyChart   I connected with  [name], [name]  on 02/03/23 via WebEx/MyChart/Doximity Video and verified that I am speaking with the correct person using two identifiers.   I discussed the limitations, risks, security and privacy concerns of performing an evaluation and management service by WebEx/MyChart/Doximity Video, including the higher likelihood of inaccurate diagnosis and treatment, and the availability of in person appointments.  We also discussed the likely need of an additional face to face encounter for complete and high quality delivery of care.  I also discussed with the patient that there may be a patient responsible charge related to this service. The patient expressed understanding and wishes to proceed.  Provider location is in medical facility. Patient location is at their home, different from provider location. People involved in care of the patient during this telehealth encounter were myself, my nurse/medical assistant, and my front office/scheduling team member.  Review of Systems: No fevers, chills, night sweats, weight loss, chest pain, or shortness of breath.   Objective Findings:    General: Speaking full sentences, no audible heavy breathing.  Sounds alert and appropriately interactive.  Appears well.  Face symmetric.  Extraocular movements intact.  Pupils equal and round.  No nasal flaring or accessory muscle use visualized.  Independent interpretation of tests performed by another provider:   None.  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 Switching to Lyrica. Due to persistence of  symptoms we will proceed with cervicothoracic MRI. We did go over his x-rays together on screen share.   I discussed the above assessment and treatment plan with the patient. The patient was provided an opportunity to ask questions and all were answered. The patient agreed with the plan and demonstrated an understanding of the instructions.   The patient was advised to call back or seek an in-person evaluation if the symptoms worsen or if the condition fails to improve as anticipated.   I provided 30 minutes of face to face and non-face-to-face time during this encounter date, time was needed to gather information, review chart, records, communicate/coordinate with staff remotely, as well as complete documentation.   ____________________________________________ Ihor Austin. Bristol Stain, M.D., ABFM., CAQSM., AME. Primary Care and Sports Medicine [facility]  Adjunct Professor of Family Medicine  [employer]  Restaurant manager, fast food

## 2023-01-28 NOTE — Assessment & Plan Note (Addendum)
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 Switching to Lyrica. Due to persistence of symptoms we will proceed with cervicothoracic MRI. We did go over his x-rays together on screen share.

## 2023-01-28 NOTE — Therapy (Signed)
OUTPATIENT PHYSICAL THERAPY TREATMENT NOTE   Patient Name: Jesse KyleBenjamin J Gracia, [name] MRN: [id number] DOB:1981-04-17, 42 y.o., male Today's Date: 01/28/2023  PCP: Agapito GamesMetheney, Catherine D, MD   REFERRING PROVIDER: Monica Bectonhekkekandam, Thomas J, MD  END OF SESSION:   PT End of Session - 01/28/23 1342     Visit Number 7    Number of Visits 16    Date for PT Re-Evaluation 03/04/23    PT Start Time 1345    PT Stop Time 1428    PT Time Calculation (min) 43 min    Activity Tolerance Patient limited by pain             Past Medical History:  Diagnosis Date   Allergy    Arthritis    Asthma    Chronic prostatitis    Myalgia    Pulmonary infiltrates    Sinusitis, chronic    Tonsillar abscess    Vasculitis    Past Surgical History:  Procedure Laterality Date   BRONCHOSCOPY  2011   TBBX ----inflammation   IR FLUORO GUIDE CV LINE RIGHT  01/31/2017   IR FLUORO GUIDE CV MIDLINE PICC RIGHT  01/30/2017   IR US GUIDE VASC ACCESS RIGHT  01/30/2017   peritonsilar abscess     TYMPANOPLASTY     VIDEO BRONCHOSCOPY Bilateral 06/25/2018   Procedure: VIDEO BRONCHOSCOPY WITHOUT FLUORO;  Surgeon: Coralyn HellingSood, Vineet, MD;  Location: [facility];  Service: Cardiopulmonary;  Laterality: Bilateral;   Patient Active Problem List   Diagnosis Date Noted   Testicular swelling, right 01/08/2023   Scrotal pain 01/08/2023   Radiculitis of left cervical region 12/30/2022   Left lumbar radiculitis 12/30/2022   Osteoporosis 12/30/2022   Long term current use of systemic steroids 05/17/2021   Closed fracture of nasal bones 06/19/2020   Coccygeal pain 06/16/2020   Nose injury, initial encounter 06/16/2020   Atherosclerosis of aorta 04/12/2019   Pulmonary infiltrates    Medication monitoring encounter 03/03/2018   Pulmonary nodule seen on imaging study 01/15/2018   Microscopic polyangiitis 03/14/2016   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   Dyshidrotic eczema 04/08/2014   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   MYALGIA 06/28/2010   GERD 06/27/2010   Nonspecific (abnormal) findings on radiological and other examination of body structure 05/29/2010   CT, CHEST, ABNORMAL 05/29/2010   Asthma with bronchitis 04/09/2010   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 States he didn't have any pain with his left arm supported.States he went in the pool and went to neck deep water and his left hand went numb in pinky to middle finger and was in a lot of pain during and after   Eval: Patient reports intense neck pain that started 1 and half weeks ago.  Patient reports he was fine on Saturday where he went rollerskating and had no falls or issues but the next day his neck/shoulder/arm started to hurt.  Reports he played the guitar which increased his pain on this left side.  Reports he was given prednisone which did not change his symptoms and was also given gabapentin but has not started that yet.  Patient has a difficult time getting comfortable and pain is just continuous in nature.  He gets slight relief with immobilization but no relief with anything else he has tried at this time.   Pain radiates into left upper arm and at times into forearm.  Prior to this episode patient was having occasional symptoms but they would resolve after a short while.  During 1 of these episode he did have some hand tingling but has not had it since.   Patient also has a  current T7 compression fracture which was found during a CT scan of his chest, reports he does have pain in his lower thoracic (points to around T12) but his neck is his biggest issue at this time. Patient is right-handed but is ambidextrous and some activities.   PERTINENT HISTORY:  Osteoporosis, Wegener's granulomatosis (long term steroid use), psoriatic arthritis, T7 compression fracture   PAIN:  Are you having pain? Yes: NPRS scale: 1/10 Pain location: left arm/hand and back  Pain description: dull, continuous, weakness  Aggravating factors: R ROT  Relieving factors: keeping his neck still   PRECAUTIONS: None   WEIGHT BEARING RESTRICTIONS: No   FALLS:  Has patient fallen in last 6 months? No     OCCUPATION: nuclear physicist   PLOF: Independent   PATIENT GOALS: to have less pain and be able to play guitar       OBJECTIVE:    DIAGNOSTIC FINDINGS: 12/30/22 Xray cervical spine  FINDINGS: There is no evidence of cervical spine fracture or prevertebral soft tissue swelling. Alignment is normal. Mild endplate osteophyte formation is present at C5-C6. No significant neural foraminal stenosis bilaterally. The dens is intact and lateral masses are symmetric.   IMPRESSION: Mild degenerative changes at C5-C6.   Xray Lumbar spine FINDINGS: Alignment is anatomic. Vertebral body and disc space heights are normal. No degenerative changes. No definite pars defects.   IMPRESSION: No findings to explain the patient's pain.   CT chest 2 months ago  Musculoskeletal: Degenerative changes in the spine. Slight compression of the T7 vertebral body, unchanged. No worrisome lytic or sclerotic lesions.     COGNITION: Overall cognitive status: Within functional limits for tasks assessed                                  POSTURE: forward head and flat thoracic spine   PALPATION: Tenderness to palpation along spinous processes of thoracic spine                  Cervical   AROM: Not formally assessed on this date all  motions and cervical spine painful and limited   01/07/2023      Flexion       Extension       R ROT       L ROT       R SB      L SB                            * Pain              (Blank rows = not tested)                  UE Measurements not formally assessed on this date patient in too much pain       Upper Extremity Right 01/07/2023 Left 01/07/2023    A/PROM MMT A/PROM MMT  Shoulder Flexion          Shoulder Extension          Shoulder Abduction          Shoulder Adduction          Shoulder Internal Rotation          Shoulder External Rotation          Elbow Flexion          Elbow Extension          Wrist Flexion          Wrist Extension          Wrist Supination          Wrist Pronation          Wrist Ulnar Deviation          Wrist Radial Deviation          Grip Strength NA   NA                          (Blank rows = not tested)                       * pain     SPECIAL TESTS:  Traction relief test-positive symptoms reduced Median nerve tension-negative bilaterally   FUNCTIONAL TESTS:  Bed mobilities-slow and painful with all transitional movements     TODAY'S TREATMENT:                                                                                                                              DATE:   01/28/2023   Therapeutic Exercise:    Aerobic: Supine: PROM cervical spine focused on left motions - tolerated well, ulnar nerve glide x20 5" holds, shoulder position in supine  seated:  Prone:    Neuromuscular Re-education: Manual Therapy: STM left suboccipitals, cervical paraspinals, anterior scalenes and UT  - reduced pain afterwards - patient supine with lumbar support Therapeutic Activity: Self Care:   Modalities:  PATIENT EDUCATION:  Education details: On lymph massage to cervical spine left side Person educated: Patient Education method: Explanation, Demonstration, and Handouts Education  comprehension: verbalized understanding       HOME EXERCISE PROGRAM: 3/19 box breathing/pain relief strategies [id number]  - previous with long exhale breathing     ASSESSMENT:   CLINICAL IMPRESSION: 01/28/2023 Trialed supine position today and this was tolerated better than previous sessions however still painful in left upper extremity along with neuropathy and hand.  Able to reduce this pain and neuropathy with right cervical rotation and sidebending along with STM to left cervical musculature.  Edema noted along left cervical spine and educated patient in gentle lymphatic massage along this area to reduce swelling.  Discussed self mobilization techniques lightly performed along left cervical musculature as neuropathy significantly improved with work to these muscles.  Overall patient is tolerating therapy and positions better but continues to have pain in left upper extremity.  Will continue with current plan of care as tolerated by patient.  Eval: Patient presents to physical therapy today with severe neck and left upper arm pain that started about 1.5 weeks ago.  Assessment limited secondary to high levels of pain on this date.  Greatest relief felt with cervical traction which patient was educated on as well as other pain management strategies.  Discussed current pain response and importance of addressing pain  to down regulate central nervous system.  Patient severely limited in range of motion, function, sleep and overall quality of life secondary to pain and physical limitations at this time and would greatly benefit from skilled physical therapy.   OBJECTIVE IMPAIRMENTS: decreased activity tolerance, decreased ROM, decreased strength, improper body mechanics, postural dysfunction, and pain.    ACTIVITY LIMITATIONS: lifting, bending, sitting, sleeping, dressing, reach over head, and locomotion level   PARTICIPATION LIMITATIONS: community activity and occupation, playing guitar   PERSONAL  FACTORS: 1-2 comorbidities: osteoporosis, vasculitis,  are also affecting patient's functional outcome.    REHAB POTENTIAL: Good   CLINICAL DECISION MAKING: Evolving/moderate complexity   EVALUATION COMPLEXITY: Moderate     GOALS: Goals reviewed with patient? yes   SHORT TERM GOALS: Target date: 02/04/2023  Patient will be independent in self management strategies to improve quality of life and functional outcomes. Baseline: New Program Goal status: INITIAL   2.  Patient will report at least 50% improvement in overall symptoms and/or function to demonstrate improved functional mobility Baseline: 0% better Goal status: INITIAL   3.  Patient will be able to find comfortable position at night to improve ability to get comfortable and fall asleep Baseline: Difficult finding comfortable position Goal status: INITIAL   4.  Patient will demonstrate pain-free cervical range of motion in all directions Baseline: Painful Goal status: INITIAL       LONG TERM GOALS: Target date: 03/04/2023    Patient will report at least 75% improvement in overall symptoms and/or function to demonstrate improved functional mobility Baseline: 0% better Goal status: INITIAL   2.  Patient will be able to play guitar without pain or difficulties to return to prior level of function Baseline: Painful/difficult Goal status: INITIAL   3.  Patient report no radicular symptoms in left upper extremity to improve quality of life. Baseline: Current radicular symptoms into left upper extremity Goal status: INITIAL       PLAN:   PT FREQUENCY: 2x/week   PT DURATION: 8 weeks   PLANNED INTERVENTIONS: Therapeutic exercises, Therapeutic activity, Neuromuscular re-education, Balance training, Gait training, Patient/Family  education, Self Care, Joint mobilization, Joint manipulation, Stair training, Canalith repositioning, Orthotic/Fit training, DME instructions, Aquatic Therapy, Dry Needling, Electrical  stimulation, Spinal manipulation, Spinal mobilization, Cryotherapy, Moist heat, Traction, Ionotophoresis 4mg /ml Dexamethasone, Manual therapy, and Re-evaluation.   PLAN FOR NEXT SESSION:  vibration therapy, skin roling, seated exercises for upcoming trip? Cervical traction, Pain management strategies-IFC, manual, heat/ice, cupping, breathing strategies, scapular protraction, isometrics, continue assessment of cervical spine     3:20 PM, 01/28/23 Tereasa Coop, DPT Physical Therapy with [facility]

## 2023-01-29 ENCOUNTER — Other Ambulatory Visit (HOSPITAL_COMMUNITY): Payer: Self-pay

## 2023-01-29 ENCOUNTER — Other Ambulatory Visit: Payer: Self-pay

## 2023-01-30 ENCOUNTER — Encounter: Payer: Self-pay | Admitting: Physical Therapy

## 2023-01-30 ENCOUNTER — Ambulatory Visit (INDEPENDENT_AMBULATORY_CARE_PROVIDER_SITE_OTHER): Payer: Commercial Managed Care - PPO | Admitting: Physical Therapy

## 2023-01-30 DIAGNOSIS — M542 Cervicalgia: Secondary | ICD-10-CM

## 2023-01-30 DIAGNOSIS — M6281 Muscle weakness (generalized): Secondary | ICD-10-CM | POA: Diagnosis not present

## 2023-01-30 DIAGNOSIS — M5459 Other low back pain: Secondary | ICD-10-CM | POA: Diagnosis not present

## 2023-01-30 NOTE — Therapy (Signed)
OUTPATIENT PHYSICAL THERAPY TREATMENT NOTE   Patient Name: Jesse CHEVEZ, [name] MRN: [id number] DOB:April 27, 1981, 42 y.o., male Today's Date: 01/30/2023  PCP: Agapito Games, MD   REFERRING PROVIDER: Monica Becton, MD  END OF SESSION:   PT End of Session - 01/30/23 1552     Visit Number 8    Number of Visits 16    Date for PT Re-Evaluation 03/04/23    PT Start Time 1553    PT Stop Time 1648    PT Time Calculation (min) 55 min    Activity Tolerance Patient limited by pain             Past Medical History:  Diagnosis Date   Allergy    Arthritis    Asthma    Chronic prostatitis    Myalgia    Pulmonary infiltrates    Sinusitis, chronic    Tonsillar abscess    Vasculitis    Past Surgical History:  Procedure Laterality Date   BRONCHOSCOPY  2011   TBBX ----inflammation   IR FLUORO GUIDE CV LINE RIGHT  01/31/2017   IR FLUORO GUIDE CV MIDLINE PICC RIGHT  01/30/2017   IR US GUIDE VASC ACCESS RIGHT  01/30/2017   peritonsilar abscess     TYMPANOPLASTY     VIDEO BRONCHOSCOPY Bilateral 06/25/2018   Procedure: VIDEO BRONCHOSCOPY WITHOUT FLUORO;  Surgeon: Coralyn Helling, MD;  Location: [facility];  Service: Cardiopulmonary;  Laterality: Bilateral;   Patient Active Problem List   Diagnosis Date Noted   Testicular swelling, right 01/08/2023   Scrotal pain 01/08/2023   Radiculitis of left cervical region 12/30/2022   Left lumbar radiculitis 12/30/2022   Osteoporosis 12/30/2022   Long term current use of systemic steroids 05/17/2021   Closed fracture of nasal bones 06/19/2020   Coccygeal pain 06/16/2020   Nose injury, initial encounter 06/16/2020   Atherosclerosis of aorta 04/12/2019   Pulmonary infiltrates    Medication monitoring encounter 03/03/2018   Pulmonary nodule seen on imaging study 01/15/2018   Microscopic polyangiitis 03/14/2016   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   Dyshidrotic eczema 04/08/2014   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   MYALGIA 06/28/2010   GERD 06/27/2010   Nonspecific (abnormal) findings on radiological and other examination of body structure 05/29/2010   CT, CHEST, ABNORMAL 05/29/2010   Asthma with bronchitis 04/09/2010   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 States that yesterday was also not so great. States he also took a little bit less aleve, and realized it was helping more then he realized. State he gets his chemo treatment tomorrow.   Eval: Patient reports intense neck pain that started 1 and half weeks ago.  Patient reports he was fine on Saturday where he went rollerskating and had no falls or issues but the next day his neck/shoulder/arm started to hurt.  Reports he played the guitar which increased his pain on this left side.  Reports he was given prednisone which did not change his symptoms and was also given gabapentin but has not started that yet.  Patient has a difficult time getting comfortable and pain is just continuous in nature.  He gets slight relief with immobilization but no relief with anything else he has tried at this time.   Pain radiates into left upper arm and at times into forearm.  Prior to this episode patient was having occasional symptoms but they would resolve after a short while.  During 1 of these episode he did have some hand tingling but has not had it since.   Patient also has a current T7 compression  fracture which was found during a CT scan of his chest, reports he does have pain in his lower thoracic (points to around T12) but his neck is his biggest issue at this time. Patient is right-handed but is ambidextrous and some activities.   PERTINENT HISTORY:  Osteoporosis, Wegener's granulomatosis (long term steroid use), psoriatic arthritis, T7 compression fracture   PAIN:  Are you having pain? Yes: NPRS scale: 2/10 Pain location: left arm/hand and back  Pain description: dull, continuous, weakness  Aggravating factors: R ROT  Relieving factors: keeping his neck still   PRECAUTIONS: None   WEIGHT BEARING RESTRICTIONS: No   FALLS:  Has patient fallen in last 6 months? No     OCCUPATION: nuclear physicist   PLOF: Independent   PATIENT GOALS: to have less pain and be able to play guitar       OBJECTIVE:    DIAGNOSTIC FINDINGS: 12/30/22 Xray cervical spine  FINDINGS: There is no evidence of cervical spine fracture or prevertebral soft tissue swelling. Alignment is normal. Mild endplate osteophyte formation is present at C5-C6. No significant neural foraminal stenosis bilaterally. The dens is intact and lateral masses are symmetric.   IMPRESSION: Mild degenerative changes at C5-C6.   Xray Lumbar spine FINDINGS: Alignment is anatomic. Vertebral body and disc space heights are normal. No degenerative changes. No definite pars defects.   IMPRESSION: No findings to explain the patient's pain.   CT chest 2 months ago  Musculoskeletal: Degenerative changes in the spine. Slight compression of the T7 vertebral body, unchanged. No worrisome lytic or sclerotic lesions.     COGNITION: Overall cognitive status: Within functional limits for tasks assessed                                  POSTURE: forward head and flat thoracic spine   PALPATION: Tenderness to palpation along spinous processes of thoracic spine                  Cervical  AROM: Not formally  assessed on this date all motions and cervical spine painful and limited   01/07/2023  Flexion       Extension       R ROT       L ROT       R SB      L SB                            * Pain              (Blank rows = not tested)                  UE Measurements not formally assessed on this date patient in too much pain       Upper Extremity Right 01/07/2023 Left 01/07/2023    A/PROM MMT A/PROM MMT  Shoulder Flexion          Shoulder Extension          Shoulder Abduction          Shoulder Adduction          Shoulder Internal Rotation          Shoulder External Rotation          Elbow Flexion          Elbow Extension          Wrist Flexion          Wrist Extension          Wrist Supination          Wrist Pronation          Wrist Ulnar Deviation          Wrist Radial Deviation          Grip Strength NA   NA                          (Blank rows = not tested)                       * pain     SPECIAL TESTS:  Traction relief test-positive symptoms reduced Median nerve tension-negative bilaterally   FUNCTIONAL TESTS:  Bed mobilities-slow and painful with all transitional movements     TODAY'S TREATMENT:                                                                                                                              DATE:   01/30/2023   Therapeutic Exercise:    Aerobic: Supine:  seated: Shoulder internal rotation isometric PT performed initially times five 5-second holds transition to patient performed times five 5-second holds left upper extremity Prone:    Neuromuscular Re-education: Standing venus at wall - verbal and tactile cues -lateral costal breathing at wall verbal and tactile cues total 30 minutes Manual Therapy: STM to left rhomboids, intercostals, trapezius, infraspinatus.  PA to left ribs grade 2/3 Therapeutic Activity: Self Care:  Modalities:    PATIENT EDUCATION:  Education details: Breathing techniques and rationale behind venous  exercise at wall Person educated: Patient Education method: Explanation, Demonstration, and Handouts Education comprehension: verbalized understanding       HOME EXERCISE PROGRAM: 3/19 box breathing/pain relief strategies [id number]  - previous with long exhale breathing     ASSESSMENT:   CLINICAL IMPRESSION: 01/30/2023 Focused on breathing exercises and standing in supine position was not tolerated well previous session.  This was tolerated moderately well but increased pain noted with scapular protraction and lengthening of upper extremities.  Transition to prone over 2 pillows which was tolerated well and performed manual interventions along ribs and thoracic musculature.  Tolerated this moderately well reporting less pain afterwards.  Infraspinatus and spasm/increased resting tone which was alleviated with contract relax technique.  Overall less pain noted end of session and will continue with current plan of care as tolerated.  Eval: Patient presents to physical therapy today with severe neck and left upper arm pain that started about 1.5 weeks ago.  Assessment limited secondary to high levels of pain on this date.  Greatest relief felt with cervical traction which patient was educated on as well as other pain management strategies.  Discussed current pain response and importance of addressing pain  to down regulate central nervous system.  Patient severely limited in range of motion, function, sleep and overall quality of life secondary to pain and physical limitations at this time and would greatly benefit from skilled physical therapy.   OBJECTIVE IMPAIRMENTS: decreased activity tolerance, decreased ROM, decreased strength, improper body mechanics, postural dysfunction, and pain.    ACTIVITY LIMITATIONS: lifting, bending, sitting, sleeping, dressing, reach over head, and locomotion level   PARTICIPATION LIMITATIONS: community activity and occupation, playing guitar   PERSONAL FACTORS:  1-2 comorbidities: osteoporosis, vasculitis,  are also affecting patient's functional outcome.    REHAB POTENTIAL: Good   CLINICAL DECISION MAKING: Evolving/moderate complexity   EVALUATION COMPLEXITY: Moderate     GOALS: Goals reviewed with patient? yes   SHORT TERM GOALS: Target date: 02/04/2023  Patient will be independent in self management strategies to improve quality of life and functional outcomes. Baseline: New Program Goal status: INITIAL   2.  Patient will report at least 50% improvement in overall symptoms and/or function to demonstrate improved functional mobility Baseline: 0% better Goal status: INITIAL   3.  Patient will be able to find comfortable position at night to improve ability to get comfortable and fall asleep Baseline: Difficult finding comfortable position Goal status: INITIAL   4.  Patient will demonstrate pain-free cervical range of motion in all directions Baseline: Painful Goal status: INITIAL       LONG TERM GOALS: Target date: 03/04/2023    Patient will report at least 75% improvement in overall symptoms and/or function to demonstrate improved functional mobility Baseline: 0% better Goal status: INITIAL   2.  Patient will be able to play guitar without pain or difficulties to return to prior level of function Baseline: Painful/difficult Goal status: INITIAL   3.  Patient report no radicular symptoms in left upper extremity to improve quality of life. Baseline: Current radicular symptoms into left upper extremity Goal status: INITIAL       PLAN:   PT FREQUENCY: 2x/week   PT DURATION: 8 weeks   PLANNED INTERVENTIONS: Therapeutic exercises, Therapeutic activity, Neuromuscular re-education, Balance training, Gait training, Patient/Family education, Self Care, Joint mobilization, Joint manipulation, Stair training, Canalith repositioning, Orthotic/Fit training, DME instructions, Aquatic Therapy,  Dry Needling, Electrical stimulation, Spinal  manipulation, Spinal mobilization, Cryotherapy, Moist heat, Traction, Ionotophoresis 4mg /ml Dexamethasone, Manual therapy, and Re-evaluation.   PLAN FOR NEXT SESSION:  vibration therapy, skin roling, seated exercises for upcoming trip? Cervical traction, Pain management strategies-IFC, manual, heat/ice, cupping, breathing strategies, scapular protraction, isometrics, continue assessment of cervical spine     4:55 PM, 01/30/23 Tereasa Coop, DPT Physical Therapy with [facility]

## 2023-01-31 ENCOUNTER — Other Ambulatory Visit: Payer: Self-pay

## 2023-01-31 ENCOUNTER — Ambulatory Visit (INDEPENDENT_AMBULATORY_CARE_PROVIDER_SITE_OTHER): Payer: Commercial Managed Care - PPO

## 2023-01-31 VITALS — BP 116/72 | HR 78 | Temp 98.0°F | Resp 20 | Ht 79.0 in | Wt 193.9 lb

## 2023-01-31 DIAGNOSIS — M317 Microscopic polyangiitis: Secondary | ICD-10-CM | POA: Diagnosis not present

## 2023-01-31 MED ORDER — DIPHENHYDRAMINE HCL 25 MG PO CAPS: 50.0000 mg | ORAL_CAPSULE | Freq: Once | ORAL | Status: DC

## 2023-01-31 MED ORDER — METHYLPREDNISOLONE SODIUM SUCC 125 MG IJ SOLR
100.0000 mg | Freq: Once | INTRAMUSCULAR | Status: AC
  Administered 2023-01-31: 100 mg via INTRAVENOUS
  Filled 2023-01-31: qty 2

## 2023-01-31 MED ORDER — DIPHENHYDRAMINE HCL 50 MG/ML IJ SOLN
50.0000 mg | Freq: Once | INTRAMUSCULAR | Status: AC
  Administered 2023-01-31: 50 mg via INTRAVENOUS
  Filled 2023-01-31: qty 1

## 2023-01-31 MED ORDER — SODIUM CHLORIDE 0.9 % IV SOLN
1000.0000 mg | Freq: Once | INTRAVENOUS | Status: AC
  Administered 2023-01-31: 1000 mg via INTRAVENOUS
  Filled 2023-01-31: qty 100

## 2023-01-31 MED ORDER — ACETAMINOPHEN 325 MG PO TABS: 650.0000 mg | ORAL_TABLET | Freq: Once | ORAL | Status: DC

## 2023-01-31 NOTE — Progress Notes (Signed)
Diagnosis: Microscopic Polyangitis  Provider:  Chilton Greathouse MD  Procedure: Infusion  IV Type: Peripheral, IV Location: R Antecubital  Truxima (Rituximab-abbs), Dose: 1000mg   Infusion Start Time: 1037  Infusion Stop Time: 1511  Post Infusion IV Care: Patient declined observation and Peripheral IV Discontinued  Discharge: Condition: Good, Destination: Home . AVS Declined  Performed by:  Adriana Mccallum, RN

## 2023-02-02 ENCOUNTER — Ambulatory Visit (INDEPENDENT_AMBULATORY_CARE_PROVIDER_SITE_OTHER): Payer: Commercial Managed Care - PPO

## 2023-02-02 DIAGNOSIS — M549 Dorsalgia, unspecified: Secondary | ICD-10-CM | POA: Diagnosis not present

## 2023-02-02 DIAGNOSIS — M5412 Radiculopathy, cervical region: Secondary | ICD-10-CM

## 2023-02-02 DIAGNOSIS — M4802 Spinal stenosis, cervical region: Secondary | ICD-10-CM | POA: Diagnosis not present

## 2023-02-02 DIAGNOSIS — G8929 Other chronic pain: Secondary | ICD-10-CM | POA: Diagnosis not present

## 2023-02-02 DIAGNOSIS — M542 Cervicalgia: Secondary | ICD-10-CM

## 2023-02-02 DIAGNOSIS — M40204 Unspecified kyphosis, thoracic region: Secondary | ICD-10-CM | POA: Diagnosis not present

## 2023-02-04 ENCOUNTER — Ambulatory Visit (INDEPENDENT_AMBULATORY_CARE_PROVIDER_SITE_OTHER): Payer: Commercial Managed Care - PPO | Admitting: Physical Therapy

## 2023-02-04 ENCOUNTER — Encounter: Payer: Self-pay | Admitting: Physical Therapy

## 2023-02-04 DIAGNOSIS — M6281 Muscle weakness (generalized): Secondary | ICD-10-CM | POA: Diagnosis not present

## 2023-02-04 DIAGNOSIS — M542 Cervicalgia: Secondary | ICD-10-CM

## 2023-02-04 DIAGNOSIS — M5459 Other low back pain: Secondary | ICD-10-CM | POA: Diagnosis not present

## 2023-02-04 NOTE — Therapy (Signed)
OUTPATIENT PHYSICAL THERAPY TREATMENT NOTE   Patient Name: Jesse WALTMAN, [name] MRN: [id number] DOB:19-Jul-1981, 42 y.o., male Today's Date: 02/04/2023  PCP: Agapito Games, MD   REFERRING PROVIDER: Monica Becton, MD  END OF SESSION:   PT End of Session - 02/04/23 1516     Visit Number 9    Number of Visits 16    Date for PT Re-Evaluation 03/04/23    PT Start Time 1517    PT Stop Time 1557    PT Time Calculation (min) 40 min    Activity Tolerance Patient limited by pain             Past Medical History:  Diagnosis Date   Allergy    Arthritis    Asthma    Chronic prostatitis    Myalgia    Pulmonary infiltrates    Sinusitis, chronic    Tonsillar abscess    Vasculitis    Past Surgical History:  Procedure Laterality Date   BRONCHOSCOPY  2011   TBBX ----inflammation   IR FLUORO GUIDE CV LINE RIGHT  01/31/2017   IR FLUORO GUIDE CV MIDLINE PICC RIGHT  01/30/2017   IR US GUIDE VASC ACCESS RIGHT  01/30/2017   peritonsilar abscess     TYMPANOPLASTY     VIDEO BRONCHOSCOPY Bilateral 06/25/2018   Procedure: VIDEO BRONCHOSCOPY WITHOUT FLUORO;  Surgeon: Coralyn Helling, MD;  Location: [facility];  Service: Cardiopulmonary;  Laterality: Bilateral;   Patient Active Problem List   Diagnosis Date Noted   Testicular swelling, right 01/08/2023   Scrotal pain 01/08/2023   Radiculitis of left cervical region 12/30/2022   Left lumbar radiculitis 12/30/2022   Osteoporosis 12/30/2022   Long term current use of systemic steroids 05/17/2021   Closed fracture of nasal bones 06/19/2020   Coccygeal pain 06/16/2020   Nose injury, initial encounter 06/16/2020   Atherosclerosis of aorta 04/12/2019   Pulmonary infiltrates    Medication monitoring encounter 03/03/2018   Pulmonary nodule seen on imaging study 01/15/2018   Microscopic polyangiitis 03/14/2016   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   Dyshidrotic eczema 04/08/2014   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 States that the MRI on Sunday was terrible and he was in servere pain. States that he went home and did his exercises and felt better.  Eval: Patient reports intense neck pain that started 1 and half weeks ago.  Patient reports he was fine on Saturday where he went rollerskating and had no falls or issues but the next day his neck/shoulder/arm started to hurt.  Reports he played the guitar which increased his pain on this left side.  Reports he was given prednisone which did not change his symptoms and was also given gabapentin but has not started that yet.  Patient has a difficult time getting comfortable and pain is just continuous in nature.  He gets slight relief with immobilization but no relief with anything else he has tried at this time.   Pain radiates into left upper arm and at times into forearm.  Prior to this episode patient was having occasional symptoms but they would resolve after a short while.  During 1 of these episode he did have some hand tingling but has not had it since.   Patient also has a current T7 compression fracture which was found during a CT scan of his chest, reports he does have pain in his  lower thoracic (points to around T12) but his neck is his biggest issue at this time. Patient is right-handed but is ambidextrous and some activities.   PERTINENT HISTORY:  Osteoporosis, Wegener's granulomatosis (long term steroid use), psoriatic arthritis, T7 compression fracture   PAIN:  Are you having pain? Yes: NPRS scale: 2/10 Pain location: left arm/hand and back  Pain description: dull, continuous, weakness  Aggravating factors: R ROT  Relieving factors: keeping his neck still   PRECAUTIONS: None   WEIGHT BEARING RESTRICTIONS: No   FALLS:  Has patient fallen in last 6 months? No     OCCUPATION: nuclear physicist   PLOF: Independent   PATIENT GOALS: to have less pain and be able to play guitar       OBJECTIVE:    DIAGNOSTIC FINDINGS: 12/30/22 Xray cervical spine  FINDINGS: There is no evidence of cervical spine fracture or prevertebral soft tissue swelling. Alignment is normal. Mild endplate osteophyte formation is present at C5-C6. No significant neural foraminal stenosis bilaterally. The dens is intact and lateral masses are symmetric.   IMPRESSION: Mild degenerative changes at C5-C6.   Xray Lumbar spine FINDINGS: Alignment is anatomic. Vertebral body and disc space heights are normal. No degenerative changes. No definite pars defects.   IMPRESSION: No findings to explain the patient's pain.   CT chest 2 months ago  Musculoskeletal: Degenerative changes in the spine. Slight compression of the T7 vertebral body, unchanged. No worrisome lytic or sclerotic lesions.     COGNITION: Overall cognitive status: Within functional limits for tasks assessed                                  POSTURE: forward head and flat thoracic spine   PALPATION: Tenderness to palpation along spinous processes of thoracic spine                  Cervical  AROM: Not formally assessed on this date all motions and cervical spine painful and limited   01/07/2023       Flexion       Extension  R ROT       L ROT       R SB      L SB                            * Pain              (Blank rows = not tested)                  UE Measurements not formally assessed on this date patient in too much pain       Upper Extremity Right 01/07/2023 Left 01/07/2023    A/PROM MMT A/PROM MMT  Shoulder Flexion          Shoulder Extension          Shoulder Abduction          Shoulder Adduction          Shoulder Internal Rotation          Shoulder External Rotation          Elbow Flexion          Elbow Extension          Wrist Flexion          Wrist Extension          Wrist Supination          Wrist Pronation          Wrist Ulnar Deviation          Wrist Radial Deviation          Grip Strength NA   NA                          (Blank rows = not tested)                       * pain     SPECIAL TESTS:  Traction relief test-positive symptoms reduced Median nerve tension-negative bilaterally   FUNCTIONAL TESTS:  Bed mobilities-slow and painful with all transitional movements     TODAY'S TREATMENT:                                                                                                                              DATE:   02/04/2023  Manual: STM to left pterygoid/masseter and education on self mobilization of this area   PATIENT EDUCATION:  Education details: Education on TMJ/cervical issues, on benefits of injection/epidural, on current presentation, on high progressive techniques, on self mobilization techniques with Thera cane Person educated: Patient Education method: Explanation, Demonstration, and Handouts Education comprehension: verbalized understanding       HOME EXERCISE PROGRAM: 3/19 box breathing/pain relief strategies [id number]  - previous with long exhale breathing     ASSESSMENT:   CLINICAL IMPRESSION: 02/04/2023 Session focused on education and answering all questions  about recent MRI as well as current presentation and  pain.  Discussed continued therapy to help address posture and reduce continued progression of spinal conditions.  Discussed injections/epidural benefits in regards to current pain presentation and inability to lay flat and extend neck at this time.  Discussed how stenosis/degeneration occurs over time and not overnight as well as activities that may aggravate current cervical presentation.  Patient with reports of pain along head during recent chemo injection, quick assessment demonstrated increased pain and tone along left TMJ and jaw muscles.  Will assess further next session.  Eval: Patient presents to physical therapy today with severe neck and left upper arm pain that started about 1.5 weeks ago.  Assessment limited secondary to high levels of pain on this date.  Greatest relief felt with cervical traction which patient was educated on as well as other pain management strategies.  Discussed current pain response and importance of addressing pain  to down regulate central nervous system.  Patient severely limited in range of motion, function, sleep and overall quality of life secondary to pain and physical limitations at this time and would greatly benefit from skilled physical therapy.   OBJECTIVE IMPAIRMENTS: decreased activity tolerance, decreased ROM, decreased strength, improper body mechanics, postural dysfunction, and pain.    ACTIVITY LIMITATIONS: lifting, bending, sitting, sleeping, dressing, reach over head, and locomotion level   PARTICIPATION LIMITATIONS: community activity and occupation, playing guitar   PERSONAL FACTORS: 1-2 comorbidities: osteoporosis, vasculitis,  are also affecting patient's functional outcome.    REHAB POTENTIAL: Good   CLINICAL DECISION MAKING: Evolving/moderate complexity   EVALUATION COMPLEXITY: Moderate     GOALS: Goals reviewed with patient? yes   SHORT TERM GOALS: Target date: 02/04/2023  Patient will be independent in self management strategies to  improve quality of life and functional outcomes. Baseline: New Program Goal status: INITIAL   2.  Patient will report at least 50% improvement in overall symptoms and/or function to demonstrate improved functional mobility Baseline: 0% better Goal status: INITIAL   3.  Patient will be able to find comfortable position at night to improve ability to get comfortable and fall asleep Baseline: Difficult finding comfortable position Goal status: INITIAL   4.  Patient will demonstrate pain-free cervical range of motion in all directions Baseline: Painful Goal status: INITIAL       LONG TERM GOALS: Target date: 03/04/2023    Patient will report at least 75% improvement in overall symptoms and/or function to demonstrate improved functional mobility Baseline: 0% better Goal status: INITIAL   2.  Patient will be able to play guitar without pain or difficulties to return to prior level of function Baseline: Painful/difficult Goal status: INITIAL   3.  Patient report no radicular symptoms in left upper extremity to improve quality of life. Baseline: Current radicular symptoms into left upper extremity Goal status: INITIAL       PLAN:   PT FREQUENCY: 2x/week   PT DURATION: 8 weeks   PLANNED INTERVENTIONS: Therapeutic exercises, Therapeutic activity, Neuromuscular re-education, Balance training, Gait training, Patient/Family education, Self Care, Joint mobilization, Joint manipulation, Stair training, Canalith repositioning, Orthotic/Fit training, DME instructions, Aquatic Therapy, Dry Needling, Electrical stimulation, Spinal manipulation, Spinal mobilization, Cryotherapy, Moist heat, Traction, Ionotophoresis /ml Dexamethasone, Manual therapy, and Re-evaluation.   PLAN FOR NEXT SESSION:  vibration therapy, skin roling, seated exercises for upcoming trip? Cervical traction, Pain management strategies-IFC, manual, heat/ice, cupping, breathing strategies, scapular protraction, isometrics,  continue assessment of cervical spine     4:08  PM, 02/04/23 Tereasa Coop, DPT Physical Therapy with [facility]

## 2023-02-06 ENCOUNTER — Encounter: Payer: Self-pay | Admitting: Physical Therapy

## 2023-02-06 ENCOUNTER — Ambulatory Visit (INDEPENDENT_AMBULATORY_CARE_PROVIDER_SITE_OTHER): Payer: Commercial Managed Care - PPO | Admitting: Physical Therapy

## 2023-02-06 DIAGNOSIS — M5459 Other low back pain: Secondary | ICD-10-CM | POA: Diagnosis not present

## 2023-02-06 DIAGNOSIS — M6281 Muscle weakness (generalized): Secondary | ICD-10-CM

## 2023-02-06 DIAGNOSIS — M542 Cervicalgia: Secondary | ICD-10-CM | POA: Diagnosis not present

## 2023-02-06 NOTE — Therapy (Signed)
OUTPATIENT PHYSICAL THERAPY TREATMENT NOTE   Patient Name: Jesse RUHLAND, [name] MRN: [id number] DOB:05/16/1981, 42 y.o., male Today's Date: 02/06/2023  PCP: Agapito Games, MD   REFERRING PROVIDER: Monica Becton, MD  END OF SESSION:   PT End of Session - 02/06/23 0933     Visit Number 10    Number of Visits 16    Date for PT Re-Evaluation 03/04/23    PT Start Time 0933    PT Stop Time 1015    PT Time Calculation (min) 42 min    Activity Tolerance Patient limited by pain             Past Medical History:  Diagnosis Date   Allergy    Arthritis    Asthma    Chronic prostatitis    Myalgia    Pulmonary infiltrates    Sinusitis, chronic    Tonsillar abscess    Vasculitis    Past Surgical History:  Procedure Laterality Date   BRONCHOSCOPY  2011   TBBX ----inflammation   IR FLUORO GUIDE CV LINE RIGHT  01/31/2017   IR FLUORO GUIDE CV MIDLINE PICC RIGHT  01/30/2017   IR US GUIDE VASC ACCESS RIGHT  01/30/2017   peritonsilar abscess     TYMPANOPLASTY     VIDEO BRONCHOSCOPY Bilateral 06/25/2018   Procedure: VIDEO BRONCHOSCOPY WITHOUT FLUORO;  Surgeon: Coralyn Helling, MD;  Location: [facility];  Service: Cardiopulmonary;  Laterality: Bilateral;   Patient Active Problem List   Diagnosis Date Noted   Testicular swelling, right 01/08/2023   Scrotal pain 01/08/2023   Radiculitis of left cervical region 12/30/2022   Left lumbar radiculitis 12/30/2022   Osteoporosis 12/30/2022   Long term current use of systemic steroids 05/17/2021   Closed fracture of nasal bones 06/19/2020   Coccygeal pain 06/16/2020   Nose injury, initial encounter 06/16/2020   Atherosclerosis of aorta 04/12/2019   Pulmonary infiltrates    Medication monitoring encounter 03/03/2018   Pulmonary nodule seen on imaging study 01/15/2018   Microscopic polyangiitis 03/14/2016   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   Dyshidrotic eczema 04/08/2014   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 States he feels minor pain but still avoiding pain provoking activities.  Eval: Patient reports intense neck pain that started 1 and half weeks ago.  Patient reports he was fine on Saturday where he went rollerskating and had no falls or issues but the next day his neck/shoulder/arm started to hurt.  Reports he played the guitar which increased his pain on this left side.  Reports he was given prednisone which did not change his symptoms and was also given gabapentin but has not started that yet.  Patient has a difficult time getting comfortable and pain is just continuous in nature.  He gets slight relief with immobilization but no relief with anything else he has tried at this time.   Pain radiates into left upper arm and at times into forearm.  Prior to this episode patient was having occasional symptoms but they would resolve after a short while.  During 1 of these episode he did have some hand tingling but has not had it since.   Patient also has a current T7 compression fracture which was found during a CT scan of his chest, reports he does have pain in his lower thoracic (points to around T12)  but his neck is his biggest issue at this time. Patient is right-handed but is ambidextrous and some activities.   PERTINENT HISTORY:  Osteoporosis, Wegener's granulomatosis (long term steroid use), psoriatic arthritis, T7 compression fracture   PAIN:  Are you having pain? Yes: NPRS scale: 2/10 Pain location: left arm/hand and back  Pain description: dull, continuous, weakness  Aggravating factors: R ROT  Relieving factors: keeping his neck still   PRECAUTIONS: None   WEIGHT BEARING RESTRICTIONS: No   FALLS:  Has patient fallen in last 6 months? No     OCCUPATION: nuclear physicist   PLOF: Independent   PATIENT GOALS: to have less pain and be able to play guitar       OBJECTIVE:    DIAGNOSTIC FINDINGS: 12/30/22 Xray cervical spine  FINDINGS: There is no evidence of cervical spine fracture or prevertebral soft tissue swelling. Alignment is normal. Mild endplate osteophyte formation is present at C5-C6. No significant neural foraminal stenosis bilaterally. The dens is intact and lateral masses are symmetric.   IMPRESSION: Mild degenerative changes at C5-C6.   Xray Lumbar spine FINDINGS: Alignment is anatomic. Vertebral body and disc space heights are normal. No degenerative changes. No definite pars defects.   IMPRESSION: No findings to explain the patient's pain.   CT chest 2 months ago  Musculoskeletal: Degenerative changes in the spine. Slight compression of the T7 vertebral body, unchanged. No worrisome lytic or sclerotic lesions.     COGNITION: Overall cognitive status: Within functional limits for tasks assessed                                  POSTURE: forward head and flat thoracic spine   PALPATION: Tenderness to palpation along spinous processes of thoracic spine                  Cervical  AROM: Not formally assessed on this date all motions and cervical spine painful and limited   01/07/2023      Flexion       Extension       R ROT  L ROT       R SB      L SB                            * Pain              (Blank rows = not tested)                  UE Measurements not formally assessed on this date patient in too much pain       Upper Extremity Right 01/07/2023 Left 01/07/2023    A/PROM MMT A/PROM MMT  Shoulder Flexion          Shoulder Extension          Shoulder Abduction          Shoulder Adduction          Shoulder Internal Rotation          Shoulder External Rotation          Elbow Flexion          Elbow Extension          Wrist Flexion          Wrist Extension          Wrist Supination          Wrist Pronation          Wrist Ulnar Deviation          Wrist Radial Deviation          Grip Strength NA   NA                          (Blank rows = not tested)                       * pain     SPECIAL TESTS:  Traction relief test-positive symptoms reduced Median nerve tension-negative bilaterally   FUNCTIONAL TESTS:  Bed mobilities-slow and painful with all transitional movements     TODAY'S TREATMENT:                                                                                                                              DATE:   02/06/2023  Neuromuscular Re-education: lateral costal breathing and depression - focus on lengthening spine with breaths- performed standing with arms down, arm up in barrel and supine with LUMBAR and cervical support - practiced with arms relaxed - progressed to apnea holds- 33 minutes Manual: STM mobilization to upper abdominals 8 minutes  PATIENT EDUCATION:  Education details: Education on rationale behind breathing exercises and how/why to perform Person educated: Patient Education method: Programmer, multimedia, Demonstration, and Handouts Education comprehension: verbalized understanding       HOME EXERCISE PROGRAM: 3/19 box breathing/pain relief strategies [id number]  - previous with long exhale breathing  ASSESSMENT:   CLINICAL  IMPRESSION: 02/06/2023 Session focused on progression of breathing exercises and introduction to apnea.  Able to tolerate supine position with support in lumbar spine and cervical flexion.  Educated patient on positional relief with cervical flexion being most comfortable at this time.  Tolerated all exercises well with mild increase in symptoms, but this was able to return to baseline after exercises were completed.  Added new exercises to HEP and will continue with current plan of care as tolerated.  Eval: Patient presents to physical therapy today with severe neck and left upper arm pain that started about 1.5 weeks ago.  Assessment limited secondary to high levels of pain on this date.  Greatest relief felt with cervical traction which patient was educated on as well as other pain management strategies.  Discussed current pain response and importance of addressing pain  to down regulate central nervous system.  Patient severely limited in range of motion, function, sleep and overall quality of life secondary to pain and physical limitations at this time and would greatly benefit from skilled physical therapy.   OBJECTIVE IMPAIRMENTS: decreased activity tolerance, decreased ROM, decreased strength, improper body mechanics, postural dysfunction, and pain.    ACTIVITY LIMITATIONS: lifting, bending, sitting, sleeping, dressing, reach over head, and locomotion level   PARTICIPATION LIMITATIONS: community activity and occupation, playing guitar   PERSONAL FACTORS: 1-2 comorbidities: osteoporosis, vasculitis,  are also affecting patient's functional outcome.    REHAB POTENTIAL: Good   CLINICAL DECISION MAKING: Evolving/moderate complexity   EVALUATION COMPLEXITY: Moderate     GOALS: Goals reviewed with patient? yes   SHORT TERM GOALS: Target date: 02/04/2023  Patient will be independent in self management strategies to improve quality of life and functional outcomes. Baseline: New Program Goal  status: INITIAL   2.  Patient will report at least 50% improvement in overall symptoms and/or function to demonstrate improved functional mobility Baseline: 0% better Goal status: INITIAL   3.  Patient will be able to find comfortable position at night to improve ability to get comfortable and fall asleep Baseline: Difficult finding comfortable position Goal status: INITIAL   4.  Patient will demonstrate pain-free cervical range of motion in all directions Baseline: Painful Goal status: INITIAL       LONG TERM GOALS: Target date: 03/04/2023    Patient will report at least 75% improvement in overall symptoms and/or function to demonstrate improved functional mobility Baseline: 0% better Goal status: INITIAL   2.  Patient will be able to play guitar without pain or difficulties to return to prior level of function Baseline: Painful/difficult Goal status: INITIAL   3.  Patient report no radicular symptoms in left upper extremity to improve quality of life. Baseline: Current radicular symptoms into left upper extremity Goal status: INITIAL       PLAN:   PT FREQUENCY: 2x/week   PT DURATION: 8 weeks   PLANNED INTERVENTIONS: Therapeutic exercises, Therapeutic activity, Neuromuscular re-education, Balance training, Gait training, Patient/Family education, Self Care, Joint mobilization, Joint manipulation, Stair training, Canalith repositioning, Orthotic/Fit training, DME instructions, Aquatic Therapy, Dry Needling, Electrical stimulation, Spinal manipulation, Spinal mobilization, Cryotherapy, Moist heat, Traction, Ionotophoresis /ml Dexamethasone, Manual therapy, and Re-evaluation.   PLAN FOR NEXT SESSION:  vibration therapy, skin roling, seated exercises for upcoming trip? Cervical traction, Pain management strategies-IFC, manual, heat/ice, cupping, breathing strategies, scapular protraction, isometrics, continue assessment of cervical spine     11:02 AM, 02/06/23 Tereasa Coop, DPT Physical Therapy with [facility]

## 2023-02-10 ENCOUNTER — Telehealth (INDEPENDENT_AMBULATORY_CARE_PROVIDER_SITE_OTHER): Payer: Commercial Managed Care - PPO | Admitting: Family Medicine

## 2023-02-10 ENCOUNTER — Encounter: Payer: Self-pay | Admitting: Family Medicine

## 2023-02-10 DIAGNOSIS — M8000XS Age-related osteoporosis with current pathological fracture, unspecified site, sequela: Secondary | ICD-10-CM

## 2023-02-10 DIAGNOSIS — M8000XA Age-related osteoporosis with current pathological fracture, unspecified site, initial encounter for fracture: Secondary | ICD-10-CM | POA: Diagnosis not present

## 2023-02-10 DIAGNOSIS — T426X5A Adverse effect of other antiepileptic and sedative-hypnotic drugs, initial encounter: Secondary | ICD-10-CM | POA: Diagnosis not present

## 2023-02-10 DIAGNOSIS — M5412 Radiculopathy, cervical region: Secondary | ICD-10-CM | POA: Diagnosis not present

## 2023-02-10 DIAGNOSIS — T50905A Adverse effect of unspecified drugs, medicaments and biological substances, initial encounter: Secondary | ICD-10-CM

## 2023-02-10 NOTE — Assessment & Plan Note (Addendum)
Discussed referral to Endocrine.  He has had long term steroid exposure.  Will refer to Dr. Lonzo Cloud to discuss options and wether or not would recommend treatment.  We held off last year because it had improved and exposure to steroid had reduced.

## 2023-02-10 NOTE — Progress Notes (Unsigned)
He wanted to speak to Dr. Linford Arnold about his progress and what's going on with his situation

## 2023-02-10 NOTE — Progress Notes (Signed)
Virtual Visit via Video Note  I connected with [name], [name] on 02/11/23 at  8:50 AM EDT by a video enabled telemedicine application and verified that I am speaking with the correct person using two identifiers.   I discussed the limitations of evaluation and management by telemedicine and the availability of in person appointments. The patient expressed understanding and agreed to proceed.  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 Taking Aleve max and prilosec. Has helped some. Hard to extend head back. PT is helping. Pain gradually from 6/10 to 2/10. Pain is worse with rotation to the left. Tingling in the left hand.  Still has dexterity. Had some dry needling.  After recent MRI showing disc space narrowing with diffuse disc bulge and spurring specifically to the left at C6-C7 it was recommended to consider an epidural.  Noted severe left C7 foraminal stenosis.  The right side is nice and open.  But now that he is improving and wants to know if he should just continue with PT or consider the epidural.  He also has a diagnosis of osteoporosis.  Last bone density was July 2023 showing a Z-score of -2.4 previously was -2.6 so it had improved somewhat.  Does take calcium and vitamin D but is concerned about whether or not he may need additional treatment and what type would be best.  Past medical history, Surgical history, Family history not pertinant except as noted below, Social history, Allergies, and medications have been entered into the medical record, reviewed, and corrections made.    Objective:    General: Speaking clearly in complete sentences without any shortness of breath.  Alert and oriented x3.  Normal judgment. No apparent acute distress.    Impression and Recommendations:    Problem List Items Addressed This  Visit       Nervous and Auditory   Radiculitis of left cervical region    I really think if he is improving with formal PT then I would hold off on the epidural and only use that as step 2 if either not improving or if significant pain is recurring.  But right now he is really making great progress with PT and feels like he could probably get to a baseline where the pain is more manageable with daily exercise and routine.        Musculoskeletal and Integument   Osteoporosis    Discussed referral to Endocrine.  He has had long term steroid exposure.  Will refer to Dr. Lonzo Cloud to discuss options and wether or not would recommend treatment.  We held off last year because it had improved and exposure to steroid had reduced.        Relevant Orders   Ambulatory referral to Endocrinology   Other Visit Diagnoses     Medication side effect, initial encounter    -  Primary      Medication side effect-did add gabapentin to side effect list.  Gabapentin caused tinnitus and hearing loss.  Currently on pregabalin and doing well.  Orders Placed This Encounter  Procedures   Ambulatory referral to Endocrinology    Referral Priority:   Routine    Referral Type:   Consultation    Referral Reason:   Specialty Services Required    Referred to Provider:  Shamleffer, Konrad Dolores, MD    Number of Visits Requested:   1    No orders of the defined types were placed in this encounter.   I discussed the assessment and treatment plan with the patient. The patient was provided an opportunity to ask questions and all were answered. The patient agreed with the plan and demonstrated an understanding of the instructions.   The patient was advised to call back or seek an in-person evaluation if the symptoms worsen or if the condition fails to improve as anticipated.   Nani Gasser, MD

## 2023-02-10 NOTE — Assessment & Plan Note (Signed)
I really think if he is improving with formal PT then I would hold off on the epidural and only use that as step 2 if either not improving or if significant pain is recurring.  But right now he is really making great progress with PT and feels like he could probably get to a baseline where the pain is more manageable with daily exercise and routine.

## 2023-02-11 ENCOUNTER — Encounter: Payer: Self-pay | Admitting: Physical Therapy

## 2023-02-11 ENCOUNTER — Ambulatory Visit (INDEPENDENT_AMBULATORY_CARE_PROVIDER_SITE_OTHER): Payer: Commercial Managed Care - PPO | Admitting: Physical Therapy

## 2023-02-11 DIAGNOSIS — M542 Cervicalgia: Secondary | ICD-10-CM

## 2023-02-11 DIAGNOSIS — M5459 Other low back pain: Secondary | ICD-10-CM | POA: Diagnosis not present

## 2023-02-11 DIAGNOSIS — M6281 Muscle weakness (generalized): Secondary | ICD-10-CM | POA: Diagnosis not present

## 2023-02-11 NOTE — Therapy (Signed)
OUTPATIENT PHYSICAL THERAPY TREATMENT NOTE   Patient Name: Jesse VESTAL, [name] MRN: [id number] DOB:[birth year], 42 y.o., male Today's Date: 02/11/2023  PCP: Agapito Games, MD   REFERRING PROVIDER: Monica Becton, MD  END OF SESSION:   PT End of Session - 02/11/23 1515     Visit Number 11    Number of Visits 16    Date for PT Re-Evaluation 03/04/23    PT Start Time 1517    PT Stop Time 1606    PT Time Calculation (min) 49 min    Activity Tolerance Patient limited by pain             Past Medical History:  Diagnosis Date   Allergy    Arthritis    Asthma    Chronic prostatitis    Myalgia    Pulmonary infiltrates    Sinusitis, chronic    Tonsillar abscess    Vasculitis    Past Surgical History:  Procedure Laterality Date   BRONCHOSCOPY  2011   TBBX ----inflammation   IR FLUORO GUIDE CV LINE RIGHT  01/31/2017   IR FLUORO GUIDE CV MIDLINE PICC RIGHT  01/30/2017   IR US GUIDE VASC ACCESS RIGHT  01/30/2017   peritonsilar abscess     TYMPANOPLASTY     VIDEO BRONCHOSCOPY Bilateral 06/25/2018   Procedure: VIDEO BRONCHOSCOPY WITHOUT FLUORO;  Surgeon: Coralyn Helling, MD;  Location: [facility];  Service: Cardiopulmonary;  Laterality: Bilateral;   Patient Active Problem List   Diagnosis Date Noted   Testicular swelling, right 01/08/2023   Scrotal pain 01/08/2023   Radiculitis of left cervical region 12/30/2022   Left lumbar radiculitis 12/30/2022   Osteoporosis 12/30/2022   Long term current use of systemic steroids 05/17/2021   Closed fracture of nasal bones 06/19/2020   Coccygeal pain 06/16/2020   Nose injury, initial encounter 06/16/2020   Atherosclerosis of aorta 04/12/2019   Pulmonary infiltrates    Medication monitoring encounter 03/03/2018   Pulmonary nodule seen on imaging study 01/15/2018   Microscopic polyangiitis 03/14/2016   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   Dyshidrotic eczema 04/08/2014   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 States he has been doing his exercises and gets popping in his back. States that he still has pain and neuropathy. Able to get his pain to return to baseline. States that on Saturday he had severe pain and weakness in his left leg and he rested and trying to work on his posture massage it.   Eval: Patient reports intense neck pain that started 1 and half weeks ago.  Patient reports he was fine on Saturday where he went rollerskating and had no falls or issues but the next day his neck/shoulder/arm started to hurt.  Reports he played the guitar which increased his pain on this left side.  Reports he was given prednisone which did not change his symptoms and was also given gabapentin but has not started that yet.  Patient has a difficult time getting comfortable and pain is just continuous in nature.  He gets slight relief with immobilization but no relief with anything else he has tried at this time.   Pain radiates into left upper arm and at times into forearm.  Prior to this episode patient was having occasional symptoms but they would resolve after a short while.  During 1 of these episode he did have some hand tingling but has not had  it since.   Patient also has a current T7 compression fracture which was found during a CT scan of his chest, reports he does have pain in his lower thoracic (points to around T12) but his neck is his biggest issue at this time. Patient is right-handed but is ambidextrous and some activities.   PERTINENT HISTORY:  Osteoporosis, Wegener's granulomatosis (long term steroid use), psoriatic arthritis, T7 compression fracture   PAIN:  Are you having pain? Yes: NPRS scale: 2/10 Pain location: left arm/hand and back  Pain description: dull, continuous, weakness  Aggravating factors: R ROT  Relieving factors: keeping his neck still   PRECAUTIONS: None   WEIGHT BEARING RESTRICTIONS: No   FALLS:  Has patient fallen in last 6 months? No     OCCUPATION: nuclear physicist   PLOF: Independent   PATIENT GOALS: to have less pain and be able to play guitar       OBJECTIVE:    DIAGNOSTIC FINDINGS: 12/30/22 Xray cervical spine  FINDINGS: There is no evidence of cervical spine fracture or prevertebral soft tissue swelling. Alignment is normal. Mild endplate osteophyte formation is present at C5-C6. No significant neural foraminal stenosis bilaterally. The dens is intact and lateral masses are symmetric.   IMPRESSION: Mild degenerative changes at C5-C6.   Xray Lumbar spine FINDINGS: Alignment is anatomic. Vertebral body and disc space heights are normal. No degenerative changes. No definite pars defects.   IMPRESSION: No findings to explain the patient's pain.   CT chest 2 months ago  Musculoskeletal: Degenerative changes in the spine. Slight compression of the T7 vertebral body, unchanged. No worrisome lytic or sclerotic lesions.     COGNITION: Overall cognitive status: Within functional limits for tasks assessed                                  POSTURE: forward head and flat thoracic spine   PALPATION: Tenderness to palpation along spinous processes of thoracic  spine                  Cervical  AROM: Not formally assessed on this  date all motions and cervical spine painful and limited   01/07/2023      Flexion       Extension       R ROT       L ROT       R SB      L SB                            * Pain              (Blank rows = not tested)                  UE Measurements not formally assessed on this date patient in too much pain       Upper Extremity Right 01/07/2023 Left 01/07/2023    A/PROM MMT A/PROM MMT  Shoulder Flexion          Shoulder Extension          Shoulder Abduction          Shoulder Adduction          Shoulder Internal Rotation          Shoulder External Rotation          Elbow Flexion          Elbow Extension          Wrist Flexion          Wrist Extension          Wrist Supination          Wrist Pronation          Wrist Ulnar Deviation          Wrist Radial Deviation          Grip Strength NA   NA                          (Blank rows = not tested)                       * pain     SPECIAL TESTS:  Traction relief test-positive symptoms reduced Median nerve tension-negative bilaterally   FUNCTIONAL TESTS:  Bed mobilities-slow and painful with all transitional movements     TODAY'S TREATMENT:                                                                                                                              DATE:   02/11/2023 Therapeutic Exercise: Quadruped holds with and without chin tucks 10 minutes, Maya position for stretch and upper extremity 5 minutes, review of previous exercises Neuromuscular Re-education: Quadruped/seated /standing pelvic tilts tactile and verbal cues prior demonstration 20 minutes.  Lateral pelvic motions with ball in seated position 10 minutes Manual:   PATIENT EDUCATION:  Education details: On anatomy-psoas muscle/lumbar plexus/pelvic bones and isolated motions,  on lengthening during breathing exercises and activating at both ends(heels and cervical spine) Person  educated: Patient Education method: Explanation, Demonstration, and Handouts Education comprehension: verbalized understanding       HOME EXERCISE PROGRAM: 3/19 box breathing/pain relief strategies [id number]  - previous with long exhale breathing     ASSESSMENT:   CLINICAL IMPRESSION: 02/11/2023 Session focused on continued progression of isolated pelvic motions as well as breath work.  Improved pelvic mobility noted but continues to demonstrate thoracic extension with anterior pelvic tilt.  This improved with core activation.  Able to tolerate quadruped and mild position today with no increase in pain.  Discussed performing these positions for upper extremity strength as well as neural stretching.  Overall patient is doing very well and would continue to benefit from skilled physical therapy at this time.  Eval: Patient presents to physical therapy today with severe neck and left upper arm pain that started about 1.5 weeks ago.  Assessment limited secondary to high levels of pain on this date.  Greatest relief felt with cervical traction which patient was educated on as well as other pain management strategies.  Discussed current pain response and importance of addressing pain  to down regulate central nervous system.  Patient severely limited in range of motion, function, sleep and overall quality of life secondary to pain and physical limitations at this time and would greatly benefit from skilled physical therapy.   OBJECTIVE IMPAIRMENTS: decreased activity tolerance, decreased ROM, decreased strength, improper body mechanics, postural dysfunction, and pain.    ACTIVITY LIMITATIONS: lifting, bending, sitting, sleeping, dressing, reach over head, and locomotion level   PARTICIPATION LIMITATIONS: community activity and occupation, playing guitar   PERSONAL FACTORS: 1-2 comorbidities: osteoporosis, vasculitis,  are also affecting patient's functional outcome.    REHAB POTENTIAL: Good    CLINICAL DECISION MAKING: Evolving/moderate complexity   EVALUATION COMPLEXITY: Moderate     GOALS: Goals reviewed with patient? yes   SHORT TERM GOALS: Target date: 02/04/2023  Patient will be independent in self management strategies to improve quality of life and functional outcomes. Baseline: New Program Goal status: INITIAL   2.  Patient will report at least 50% improvement in overall symptoms and/or function to demonstrate improved functional mobility Baseline: 0% better Goal status: INITIAL   3.  Patient will be able to find comfortable position at night to improve ability to get comfortable and fall asleep Baseline: Difficult finding comfortable position Goal status: INITIAL   4.  Patient will demonstrate pain-free cervical range of motion in all directions Baseline: Painful Goal status: INITIAL       LONG TERM GOALS: Target date: 03/04/2023    Patient will report at least 75% improvement in overall symptoms and/or function to demonstrate improved functional mobility Baseline: 0% better Goal status: INITIAL   2.  Patient will be able to play guitar without pain or difficulties to return to prior level of function Baseline: Painful/difficult Goal status: INITIAL   3.  Patient report no radicular symptoms in left upper extremity to improve quality of life. Baseline: Current radicular symptoms into left upper extremity Goal status: INITIAL       PLAN:   PT FREQUENCY: 2x/week   PT DURATION: 8 weeks   PLANNED INTERVENTIONS: Therapeutic exercises, Therapeutic activity, Neuromuscular re-education, Balance training, Gait training, Patient/Family education, Self Care, Joint mobilization, Joint manipulation, Stair training, Canalith repositioning, Orthotic/Fit training, DME instructions, Aquatic Therapy, Dry Needling, Electrical stimulation, Spinal manipulation, Spinal mobilization, Cryotherapy, Moist heat, Traction, Ionotophoresis /ml Dexamethasone, Manual  therapy,  and Re-evaluation.   PLAN FOR NEXT SESSION:  vibration therapy, skin roling, seated exercises for upcoming trip? Cervical traction, Pain management strategies-IFC, manual, heat/ice, cupping, breathing strategies, scapular protraction, isometrics, continue assessment of cervical spine     4:12 PM, 02/11/23 Tereasa Coop, DPT Physical Therapy with [facility]

## 2023-02-13 ENCOUNTER — Telehealth: Payer: Commercial Managed Care - PPO | Admitting: Family Medicine

## 2023-02-13 ENCOUNTER — Other Ambulatory Visit (HOSPITAL_BASED_OUTPATIENT_CLINIC_OR_DEPARTMENT_OTHER): Payer: Self-pay

## 2023-02-13 ENCOUNTER — Ambulatory Visit: Payer: Commercial Managed Care - PPO | Admitting: Sports Medicine

## 2023-02-13 ENCOUNTER — Encounter: Payer: Commercial Managed Care - PPO | Admitting: Physical Therapy

## 2023-02-13 DIAGNOSIS — U071 COVID-19: Secondary | ICD-10-CM | POA: Diagnosis not present

## 2023-02-13 MED ORDER — NIRMATRELVIR/RITONAVIR (PAXLOVID)TABLET
3.0000 | ORAL_TABLET | Freq: Two times a day (BID) | ORAL | 0 refills | Status: AC
Start: 2023-02-13 — End: 2023-02-18
  Filled 2023-02-13 (×2): qty 30, 5d supply, fill #0

## 2023-02-13 MED ORDER — BENZONATATE 200 MG PO CAPS
200.0000 mg | ORAL_CAPSULE | Freq: Three times a day (TID) | ORAL | 0 refills | Status: DC | PRN
Start: 2023-02-13 — End: 2023-03-18
  Filled 2023-02-13 (×2): qty 30, 10d supply, fill #0

## 2023-02-13 MED ORDER — NIRMATRELVIR/RITONAVIR (PAXLOVID)TABLET
3.0000 | ORAL_TABLET | Freq: Two times a day (BID) | ORAL | 0 refills | Status: DC
Start: 2023-02-13 — End: 2023-02-13

## 2023-02-13 MED ORDER — BENZONATATE 200 MG PO CAPS
200.0000 mg | ORAL_CAPSULE | Freq: Three times a day (TID) | ORAL | 0 refills | Status: DC | PRN
Start: 2023-02-13 — End: 2023-02-13

## 2023-02-13 MED ORDER — PSEUDOEPH-BROMPHEN-DM 30-2-10 MG/5ML PO SYRP
5.0000 mL | ORAL_SOLUTION | Freq: Four times a day (QID) | ORAL | 0 refills | Status: DC | PRN
Start: 2023-02-13 — End: 2023-03-18
  Filled 2023-02-13: qty 120, 6d supply, fill #0
  Filled 2023-02-13: qty 118, 6d supply, fill #0

## 2023-02-13 MED ORDER — PSEUDOEPH-BROMPHEN-DM 30-2-10 MG/5ML PO SYRP
5.0000 mL | ORAL_SOLUTION | Freq: Four times a day (QID) | ORAL | 0 refills | Status: DC | PRN
Start: 2023-02-13 — End: 2023-02-13

## 2023-02-13 NOTE — Patient Instructions (Addendum)
[name], [name], thank you for joining Freddy Finner, NP for today's virtual visit.  While this provider is not your primary care provider (PCP), if your PCP is located in our provider database this encounter information will be shared with them immediately following your visit.   A [facility] MyChart account gives you access to today's visit and all your visits, tests, and labs performed at [facility] " click here if you don't have a [facility] MyChart account or go to [email or web address]  Consent: (Patient) [name], [name] provided verbal consent for this virtual visit at the beginning of the encounter.  Current Medications:  Current Outpatient Medications:    albuterol (PROVENTIL) (2.5 MG/3ML) 0.083% nebulizer solution, Inhale 1 vial via nebulizer every 6 hours as needed for wheezing., Disp: 180 mL, Rfl: 11   albuterol (VENTOLIN HFA) 108 (90 Base) MCG/ACT inhaler, Inhale 2 puffs into the lungs every 6 (six) hours as needed for wheezing or shortness of breath., Disp: 18 g, Rfl: 5   benzonatate (TESSALON) 200 MG capsule, Take 1 capsule (200 mg total) by mouth 3 (three) times daily as needed for cough., Disp: 30 capsule, Rfl: 0   brompheniramine-pseudoephedrine-DM 30-2-10 MG/5ML syrup, Take 5 mLs by mouth 4 (four) times daily as needed., Disp: 120 mL, Rfl: 0   budesonide-formoterol (SYMBICORT) 160-4.5 MCG/ACT inhaler, Inhale 2 puffs into the lungs 2 (two) times daily., Disp: 10.2 g, Rfl: 6   CALCIUM PO, Take 1 tablet by mouth 2 (two) times daily., Disp: , Rfl:    cholecalciferol (VITAMIN D) 1000 UNITS tablet, Take 1 tablet (1,000 Units total) by mouth daily. (Patient taking differently: Take 1,000 Units by mouth 2 (two) times daily.), Disp: 30 tablet, Rfl: 6   fluticasone (FLONASE) 50 MCG/ACT nasal spray, INSTILL 2 SPRAYS INTO BOTH NOSTRILS DAILY (NEED APPT FOR FURTHER REFILLS), Disp: 16 g, Rfl: 0   Multiple Vitamin (MULTIVITAMIN WITH MINERALS) TABS tablet,  Take 1 tablet by mouth 2 (two) times daily., Disp: , Rfl:    mupirocin ointment (BACTROBAN) 2 %, Use with nasal rinse bottle, Disp: 22 g, Rfl: 12   naproxen (NAPROSYN) 500 MG tablet, Take 1 tablet (500 mg total) by mouth 2 (two) times daily with a meal., Disp: 60 tablet, Rfl: 3   naproxen sodium (ALEVE) 220 MG tablet, Take 440 mg by mouth 2 (two) times daily as needed (headache/pain)., Disp: , Rfl:    Nebulizers (COMPRESSOR/NEBULIZER) MISC, Use as direccted, Disp: 1 each, Rfl: 0   nirmatrelvir/ritonavir (PAXLOVID) 20 x 150 MG & 10 x 100MG  TABS, Take 3 tablets by mouth 2 (two) times daily for 5 days. (Take nirmatrelvir 150 mg two tablets twice daily for 5 days and ritonavir 100 mg one tablet twice daily for 5 days) Patient GFR is 109, Disp: 30 tablet, Rfl: 0   omeprazole (PRILOSEC) 20 MG capsule, Take 1 capsule (20 mg total) by mouth daily., Disp: 90 capsule, Rfl: 0   ondansetron (ZOFRAN) 4 MG tablet, Take 1 tablet (4 mg total) by mouth every 8 (eight) hours as needed for nausea or vomiting., Disp: 20 tablet, Rfl: 0   pravastatin (PRAVACHOL) 40 MG tablet, Take 1 tablet by mouth daily., Disp: 90 tablet, Rfl: 3   pregabalin (LYRICA) 50 MG capsule, Take 1 capsule (50 mg total) by mouth every evening for 7 days, THEN 1 capsule (50 mg total) 2 (two) times daily for 7 days, THEN 1 capsule (50 mg total) 3 (three) times daily for 23 days., Disp: 90  capsule, Rfl: 3   sodium chloride HYPERTONIC 3 % nebulizer solution, Inhale 4 mL by nebulization 2  times a day., Disp: 240 mL, Rfl: 11   sodium chloride HYPERTONIC 3 % nebulizer solution, Take 3 mLs by nebulization 3 (three) times daily., Disp: 750 mL, Rfl: 12   umeclidinium-vilanterol (ANORO ELLIPTA) 62.5-25 MCG/ACT AEPB, Inhale 1 puff into the lungs daily., Disp: 60 each, Rfl: 12  Current Facility-Administered Medications:    diphenhydrAMINE (BENADRYL) injection 50 mg, 50 mg, Intramuscular, Once PRN, Causey, Larna Daughters, NP   EPINEPHrine (EPI-PEN) injection  0.3 mg, 0.3 mg, Intramuscular, Once PRN, Causey, Larna Daughters, NP   methylPREDNISolone sodium succinate (SOLU-MEDROL) 125 mg/2 mL injection 125 mg, 125 mg, Intramuscular, Once PRN, Causey, Larna Daughters, NP   Medications ordered in this encounter:  Meds ordered this encounter  Medications   DISCONTD: nirmatrelvir/ritonavir (PAXLOVID) 20 x 150 MG & 10 x  TABS    Sig: Take 3 tablets by mouth 2 (two) times daily for 5 days. (Take nirmatrelvir 150 mg two tablets twice daily for 5 days and ritonavir 100 mg one tablet twice daily for 5 days) Patient GFR is 109    Dispense:  30 tablet    Refill:  0    Order Specific Question:   Supervising Provider    Answer:   Merrilee Jansky [[id number]]   DISCONTD: brompheniramine-pseudoephedrine-DM 30-2-10 MG/5ML syrup    Sig: Take 5 mLs by mouth 4 (four) times daily as needed.    Dispense:  120 mL    Refill:  0    Order Specific Question:   Supervising Provider    Answer:   Merrilee Jansky [[id number]]   DISCONTD: benzonatate (TESSALON) 200 MG capsule    Sig: Take 1 capsule (200 mg total) by mouth 3 (three) times daily as needed for cough.    Dispense:  30 capsule    Refill:  0    Order Specific Question:   Supervising Provider    Answer:   Merrilee Jansky [[id number]]   benzonatate (TESSALON) 200 MG capsule    Sig: Take 1 capsule (200 mg total) by mouth 3 (three) times daily as needed for cough.    Dispense:  30 capsule    Refill:  0    Order Specific Question:   Supervising Provider    Answer:   Merrilee Jansky [id number]   brompheniramine-pseudoephedrine-DM 30-2-10 MG/5ML syrup    Sig: Take 5 mLs by mouth 4 (four) times daily as needed.    Dispense:  120 mL    Refill:  0    Order Specific Question:   Supervising Provider    Answer:   Merrilee Jansky [id number]   nirmatrelvir/ritonavir (PAXLOVID) 20 x 150 MG & 10 x  TABS    Sig: Take 3 tablets by mouth 2 (two) times daily for 5 days. (Take nirmatrelvir 150 mg two tablets twice  daily for 5 days and ritonavir 100 mg one tablet twice daily for 5 days) Patient GFR is 109    Dispense:  30 tablet    Refill:  0    Order Specific Question:   Supervising Provider    Answer:   Merrilee Jansky [id number]     *If you need refills on other medications prior to your next appointment, please contact your pharmacy*  Follow-Up: Call back or seek an in-person evaluation if the symptoms worsen or if the condition fails to improve as anticipated.  [facility] [phone]  Care Instructions:   - Continue OTC symptomatic management of choice - Info for COVID sent on AVS as well - Take prescribed medications as directed - Push fluids - Rest as needed  Nirmatrelvir; Ritonavir Tablets What is this medication? NIRMATRELVIR; RITONAVIR (NIR ma TREL vir; ri TOE na veer) treats mild to moderate COVID-19. It may help people who are at high risk of developing severe illness. It works by limiting the spread of the virus in your body. This medicine may be used for other purposes; ask your health care provider or pharmacist if you have questions. COMMON BRAND NAME(S): PAXLOVID What should I tell my care team before I take this medication? They need to know if you have any of these conditions: Any allergies Any serious illness Kidney disease Liver disease An unusual or allergic reaction to nirmatrelvir, ritonavir, other medications, foods, dyes, or preservatives Pregnant or trying to get pregnant Breast-feeding How should I use this medication? This product contains 2 different medications that are packaged together. For the standard dose, take 2 pink tablets of nirmatrelvir with 1 white tablet of ritonavir (3 tablets total) by mouth with water twice daily. Talk to your care team if you have kidney disease. You may need a different dose. Swallow the tablets whole. You can take it with or without food. If it upsets your stomach, take it with food. Take all of this  medication unless your care team tells you to stop it early. Keep taking it even if you think you are better. Talk to your care team about the use of this medication in children. While it may be prescribed for children as young as 12 years for selected conditions, precautions do apply. Overdosage: If you think you have taken too much of this medicine contact a poison control center or emergency room at once. NOTE: This medicine is only for you. Do not share this medicine with others. What if I miss a dose? If you miss a dose, take it as soon as you can unless it is more than 8 hours late. If it is more than 8 hours late, skip the missed dose. Take the next dose at the normal time. Do not take extra or 2 doses at the same time to make up for the missed dose. What may interact with this medication? Do not take this medication with any of the following medications: Alfuzosin Certain medications for anxiety or sleep like midazolam, triazolam Certain medications for cancer like apalutamide, enzalutamide Certain medications for cholesterol like lovastatin, simvastatin Certain medications for irregular heart beat like amiodarone, dronedarone, flecainide, propafenone, quinidine Certain medications for pain like meperidine, piroxicam Certain medications for psychotic disorders like clozapine, lurasidone, pimozide Certain medications for seizures like carbamazepine, phenobarbital, phenytoin Colchicine Eletriptan Eplerenone Ergot alkaloids like dihydroergotamine, ergonovine, ergotamine, methylergonovine Finerenone Flibanserin Ivabradine Lomitapide Naloxegol Ranolazine Rifampin Sildenafil Silodosin St. John's Wort Tolvaptan Ubrogepant Voclosporin This medication may also interact with the following medications: Bedaquiline Birth control pills Bosentan Certain antibiotics like erythromycin or clarithromycin Certain medications for blood pressure like amlodipine, diltiazem, felodipine,  nicardipine, nifedipine Certain medications for cancer like abemaciclib, ceritinib, dasatinib, encorafenib, ibrutinib, ivosidenib, neratinib, nilotinib, venetoclax, vinblastine, vincristine Certain medications for cholesterol like atorvastatin, rosuvastatin Certain medications for depression like bupropion, trazodone Certain medications for fungal infections like isavuconazonium, itraconazole, ketoconazole, voriconazole Certain medications for hepatitis C like elbasvir; grazoprevir, dasabuvir; ombitasvir; paritaprevir; ritonavir, glecaprevir; pibrentasvir, sofosbuvir; velpatasvir; voxilaprevir Certain medications for HIV or AIDS Certain medications for irregular heartbeat like lidocaine Certain medications that treat  or prevent blood clots like rivaroxaban, warfarin Digoxin Fentanyl Medications that lower your chance of fighting infection like cyclosporine, sirolimus, tacrolimus Methadone Quetiapine Rifabutin Salmeterol Steroid medications like betamethasone, budesonide, ciclesonide, dexamethasone, fluticasone, methylprednisolone, mometasone, triamcinolone This list may not describe all possible interactions. Give your health care provider a list of all the medicines, herbs, non-prescription drugs, or dietary supplements you use. Also tell them if you smoke, drink alcohol, or use illegal drugs. Some items may interact with your medicine. What should I watch for while using this medication? Your condition will be monitored carefully while you are receiving this medication. Visit your care team for regular checkups. Tell your care team if your symptoms do not start to get better or if they get worse. If you have untreated HIV infection, this medication may lead to some HIV medications not working as well in the future. Estrogen and progestin hormones may not work as well while you are taking this medication. Your care team can help you find the contraceptive option that works for you. What side  effects may I notice from receiving this medication? Side effects that you should report to your care team as soon as possible: Allergic reactions--skin rash, itching, hives, swelling of the face, lips, tongue, or throat Liver injury--right upper belly pain, loss of appetite, nausea, light-colored stool, dark yellow or brown urine, yellowing skin or eyes, unusual weakness or fatigue Redness, blistering, peeling, or loosening of the skin, including inside the mouth Side effects that usually do not require medical attention (report these to your care team if they continue or are bothersome): Change in taste Diarrhea General discomfort and fatigue Increase in blood pressure Muscle pain Nausea Stomach pain This list may not describe all possible side effects. Call your doctor for medical advice about side effects. You may report side effects to FDA at 1-800-FDA-1088. Where should I keep my medication? Keep out of the reach of children and pets. Store at room temperature between 20 and 25 degrees C (68 and 77 degrees F). Get rid of any unused medication after the expiration date. To get rid of medications that are no longer needed or have expired: Take the medication to a medication take-back program. Check with your pharmacy or law enforcement to find a location. If you cannot return the medication, check the label or package insert to see if the medication should be thrown out in the garbage or flushed down the toilet. If you are not sure, ask your care team. If it is safe to put it in the trash, take the medication out of the container. Mix the medication with cat litter, dirt, coffee grounds, or other unwanted substance. Seal the mixture in a bag or container. Put it in the trash. NOTE: This sheet is a summary. It may not cover all possible information. If you have questions about this medicine, talk to your doctor, pharmacist, or health care provider.  2023 Elsevier/Gold Standard (2020-10-16  00:00:00)   Isolation Instructions: You are to isolate at home until you have been fever free for at least 24 hours without a fever-reducing medication, and symptoms have been steadily improving for 24 hours. At that time,  you can end isolation but need to mask for an additional 5 days.   If you must be around other household members who do not have symptoms, you need to make sure that both you and the family members are masking consistently with a high-quality mask.  If you note any worsening of symptoms despite  treatment, please seek an in-person evaluation ASAP. If you note any significant shortness of breath or any chest pain, please seek ER evaluation. Please do not delay care!   COVID-19: What to Do if You Are Sick If you test positive and are an older adult or someone who is at high risk of getting very sick from COVID-19, treatment may be available. Contact a healthcare provider right away after a positive test to determine if you are eligible, even if your symptoms are mild right now. You can also visit a Test to Treat location and, if eligible, receive a prescription from a provider. Don't delay: Treatment must be started within the first few days to be effective. If you have a fever, cough, or other symptoms, you might have COVID-19. Most people have mild illness and are able to recover at home. If you are sick: Keep track of your symptoms. If you have an emergency warning sign (including trouble breathing), call 911. Steps to help prevent the spread of COVID-19 if you are sick If you are sick with COVID-19 or think you might have COVID-19, follow the steps below to care for yourself and to help protect other people in your home and community. Stay home except to get medical care Stay home. Most people with COVID-19 have mild illness and can recover at home without medical care. Do not leave your home, except to get medical care. Do not visit public areas and do not go to places where  you are unable to wear a mask. Take care of yourself. Get rest and stay hydrated. Take over-the-counter medicines, such as acetaminophen, to help you feel better. Stay in touch with your doctor. Call before you get medical care. Be sure to get care if you have trouble breathing, or have any other emergency warning signs, or if you think it is an emergency. Avoid public transportation, ride-sharing, or taxis if possible. Get tested If you have symptoms of COVID-19, get tested. While waiting for test results, stay away from others, including staying apart from those living in your household. Get tested as soon as possible after your symptoms start. Treatments may be available for people with COVID-19 who are at risk for becoming very sick. Don't delay: Treatment must be started early to be effective--some treatments must begin within 5 days of your first symptoms. Contact your healthcare provider right away if your test result is positive to determine if you are eligible. Self-tests are one of several options for testing for the virus that causes COVID-19 and may be more convenient than laboratory-based tests and point-of-care tests. Ask your healthcare provider or your local health department if you need help interpreting your test results. You can visit your state, tribal, local, and territorial health department's website to look for the latest local information on testing sites. Separate yourself from other people As much as possible, stay in a specific room and away from other people and pets in your home. If possible, you should use a separate bathroom. If you need to be around other people or animals in or outside of the home, wear a well-fitting mask. Tell your close contacts that they may have been exposed to COVID-19. An infected person can spread COVID-19 starting 48 hours (or 2 days) before the person has any symptoms or tests positive. By letting your close contacts know they may have been  exposed to COVID-19, you are helping to protect everyone. See COVID-19 and Animals if you have questions about pets. If  you are diagnosed with COVID-19, someone from the health department may call you. Answer the call to slow the spread. Monitor your symptoms Symptoms of COVID-19 include fever, cough, or other symptoms. Follow care instructions from your healthcare provider and local health department. Your local health authorities may give instructions on checking your symptoms and reporting information. When to seek emergency medical attention Look for emergency warning signs* for COVID-19. If someone is showing any of these signs, seek emergency medical care immediately: Trouble breathing Persistent pain or pressure in the chest New confusion Inability to wake or stay awake Pale, gray, or blue-colored skin, lips, or nail beds, depending on skin tone *This list is not all possible symptoms. Please call your medical provider for any other symptoms that are severe or concerning to you. Call 911 or call ahead to your local emergency facility: Notify the operator that you are seeking care for someone who has or may have COVID-19. Call ahead before visiting your doctor Call ahead. Many medical visits for routine care are being postponed or done by phone or telemedicine. If you have a medical appointment that cannot be postponed, call your doctor's office, and tell them you have or may have COVID-19. This will help the office protect themselves and other patients. If you are sick, wear a well-fitting mask You should wear a mask if you must be around other people or animals, including pets (even at home). Wear a mask with the best fit, protection, and comfort for you. You don't need to wear the mask if you are alone. If you can't put on a mask (because of trouble breathing, for example), cover your coughs and sneezes in some other way. Try to stay at least 6 feet away from other people. This will  help protect the people around you. Masks should not be placed on young children under age 52 years, anyone who has trouble breathing, or anyone who is not able to remove the mask without help. Cover your coughs and sneezes Cover your mouth and nose with a tissue when you cough or sneeze. Throw away used tissues in a lined trash can. Immediately wash your hands with soap and water for at least 20 seconds. If soap and water are not available, clean your hands with an alcohol-based hand sanitizer that contains at least 60% alcohol. Clean your hands often Wash your hands often with soap and water for at least 20 seconds. This is especially important after blowing your nose, coughing, or sneezing; going to the bathroom; and before eating or preparing food. Use hand sanitizer if soap and water are not available. Use an alcohol-based hand sanitizer with at least 60% alcohol, covering all surfaces of your hands and rubbing them together until they feel dry. Soap and water are the best option, especially if hands are visibly dirty. Avoid touching your eyes, nose, and mouth with unwashed hands. Handwashing Tips Avoid sharing personal household items Do not share dishes, drinking glasses, cups, eating utensils, towels, or bedding with other people in your home. Wash these items thoroughly after using them with soap and water or put in the dishwasher. Clean surfaces in your home regularly Clean and disinfect high-touch surfaces (for example, doorknobs, tables, handles, light switches, and countertops) in your "sick room" and bathroom. In shared spaces, you should clean and disinfect surfaces and items after each use by the person who is ill. If you are sick and cannot clean, a caregiver or other person should only clean and disinfect the  area around you (such as your bedroom and bathroom) on an as needed basis. Your caregiver/other person should wait as long as possible (at least several hours) and wear a mask  before entering, cleaning, and disinfecting shared spaces that you use. Clean and disinfect areas that may have blood, stool, or body fluids on them. Use household cleaners and disinfectants. Clean visible dirty surfaces with household cleaners containing soap or detergent. Then, use a household disinfectant. Use a product from [employer] List N: Disinfectants for Coronavirus (COVID-19). Be sure to follow the instructions on the label to ensure safe and effective use of the product. Many products recommend keeping the surface wet with a disinfectant for a certain period of time (look at "contact time" on the product label). You may also need to wear personal protective equipment, such as gloves, depending on the directions on the product label. Immediately after disinfecting, wash your hands with soap and water for 20 seconds. For completed guidance on cleaning and disinfecting your home, visit Complete Disinfection Guidance. Take steps to improve ventilation at home Improve ventilation (air flow) at home to help prevent from spreading COVID-19 to other people in your household. Clear out COVID-19 virus particles in the air by opening windows, using air filters, and turning on fans in your home. Use this interactive tool to learn how to improve air flow in your home. When you can be around others after being sick with COVID-19 Deciding when you can be around others is different for different situations. Find out when you can safely end home isolation. For any additional questions about your care, contact your healthcare provider or state or local health department. 01/09/2021 Content source: [facility] ([facility]), Division of Viral Diseases This information is not intended to replace advice given to you by your health care provider. Make sure you discuss any questions you have with your health care provider. Document Revised: 02/22/2021 Document Reviewed:  02/22/2021 Elsevier Patient Education  2022 [employer].     If you have been instructed to have an in-person evaluation today at a local Urgent Care facility, please use the link below. It will take you to a list of all of our available [facility], including address, phone number and hours of operation. Please do not delay care.  [facility]  If you or a family member do not have a primary care provider, use the link below to schedule a visit and establish care. When you choose a [facility] primary care physician or advanced practice provider, you gain a long-term partner in health. Find a Primary Care Provider  Learn more about [facility]'s in-office and virtual care options: [place] - Get Care Now

## 2023-02-13 NOTE — Progress Notes (Signed)
Virtual Visit Consent   Jesse Kyle, [name], you are scheduled for a virtual visit with a [facility] provider today. Just as with appointments in the office, your consent must be obtained to participate. Your consent will be active for this visit and any virtual visit you may have with one of our providers in the next 365 days. If you have a MyChart account, a copy of this consent can be sent to you electronically.  As this is a virtual visit, video technology does not allow for your provider to perform a traditional examination. This may limit your provider's ability to fully assess your condition. If your provider identifies any concerns that need to be evaluated in person or the need to arrange testing (such as labs, EKG, etc.), we will make arrangements to do so. Although advances in technology are sophisticated, we cannot ensure that it will always work on either your end or our end. If the connection with a video visit is poor, the visit may have to be switched to a telephone visit. With either a video or telephone visit, we are not always able to ensure that we have a secure connection.  By engaging in this virtual visit, you consent to the provision of healthcare and authorize for your insurance to be billed (if applicable) for the services provided during this visit. Depending on your insurance coverage, you may receive a charge related to this service.  I need to obtain your verbal consent now. Are you willing to proceed with your visit today? Jesse Kyle, [name] has provided verbal consent on 02/13/2023 for a virtual visit (video or telephone). Jesse Finner, NP  Date: 02/13/2023 10:03 AM  Virtual Visit via Video Note   I, Jesse Wong, connected with  Jesse Kyle, [name]  ([id number], Sep 09, 1981) on 02/13/23 at 10:00 AM EDT by a video-enabled telemedicine application and verified that I am speaking with the correct person using two identifiers.  Location: Patient: Virtual  Visit Location Patient: Home Provider: Virtual Visit Location Provider: Home Office   I discussed the limitations of evaluation and management by telemedicine and the availability of in person appointments. The patient expressed understanding and agreed to proceed.    History of Present Illness: Jesse HINCH, [name] is a 42 y.o. who identifies as a male who was assigned male at birth, and is being seen today for COVID +  Onset was last night with post nasal drainage.  Associated symptoms are congestion, stuffy, fatigued, coughing, throat discomfort, some shortness of breath Modifying factors are max dose of naproxen for back pain Denies chest pain,   Exposure to sick contacts- unknown COVID test: positive this morning  Vaccines:  some vaccines- no booster in awhile  Receiving rituxan for treatment of microscopic polyangiitis     Problems:  Patient Active Problem List   Diagnosis Date Noted   Testicular swelling, right 01/08/2023   Scrotal pain 01/08/2023   Radiculitis of left cervical region 12/30/2022   Left lumbar radiculitis 12/30/2022   Osteoporosis 12/30/2022   Long term current use of systemic steroids 05/17/2021   Closed fracture of nasal bones 06/19/2020   Coccygeal pain 06/16/2020   Nose injury, initial encounter 06/16/2020   Atherosclerosis of aorta 04/12/2019   Pulmonary infiltrates    Medication monitoring encounter 03/03/2018   Pulmonary nodule seen on imaging study 01/15/2018   Microscopic polyangiitis 03/14/2016   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   Dyshidrotic eczema 04/08/2014   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  MYALGIA 06/28/2010   GERD 06/27/2010   Nonspecific (abnormal) findings on radiological and other examination of body structure 05/29/2010   CT, CHEST, ABNORMAL 05/29/2010   Asthma with bronchitis 04/09/2010   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, Disp: 18 g, Rfl: 5   budesonide-formoterol (SYMBICORT) 160-4.5 MCG/ACT inhaler, Inhale 2 puffs into the lungs 2 (two) times daily., Disp: 10.2 g, Rfl: 6   CALCIUM PO, Take 1 tablet by mouth 2 (two) times daily., Disp: , Rfl:    cholecalciferol (VITAMIN D) 1000 UNITS tablet, Take 1 tablet (1,000 Units total) by mouth daily. (Patient taking differently: Take 1,000 Units by mouth 2 (two) times daily.), Disp: 30 tablet, Rfl: 6   fluticasone (FLONASE) 50 MCG/ACT nasal spray, INSTILL 2 SPRAYS INTO BOTH NOSTRILS DAILY (NEED APPT FOR FURTHER REFILLS), Disp: 16 g, Rfl: 0   Multiple Vitamin (MULTIVITAMIN WITH MINERALS) TABS tablet, Take 1 tablet by mouth 2 (two) times daily., Disp: , Rfl:    mupirocin ointment (BACTROBAN) 2 %, Use with nasal rinse bottle, Disp: 22 g, Rfl: 12   naproxen (NAPROSYN) 500 MG tablet, Take 1 tablet (500 mg total) by mouth 2 (two) times daily with a meal., Disp: 60 tablet, Rfl: 3   naproxen sodium (ALEVE) 220 MG tablet, Take 440 mg by mouth 2 (two) times daily as needed (headache/pain)., Disp: , Rfl:    Nebulizers (COMPRESSOR/NEBULIZER) MISC, Use as direccted, Disp: 1 each, Rfl: 0   omeprazole (PRILOSEC) 20 MG capsule, Take 1 capsule (20 mg total) by mouth daily., Disp: 90 capsule, Rfl: 0   ondansetron (ZOFRAN) 4 MG tablet, Take 1 tablet (4 mg total) by mouth every 8 (eight) hours as needed for nausea or vomiting., Disp: 20 tablet, Rfl: 0   pravastatin (PRAVACHOL) 40  MG tablet, Take 1 tablet by mouth daily., Disp: 90 tablet, Rfl: 3   pregabalin (LYRICA) 50 MG capsule, Take 1 capsule (50 mg total) by mouth every evening for 7 days, THEN 1 capsule (50 mg total) 2 (two) times daily for 7 days, THEN 1 capsule (50 mg total) 3 (three) times daily for 23 days., Disp: 90 capsule, Rfl: 3   sodium chloride HYPERTONIC 3 % nebulizer solution, Inhale 4 mL by nebulization 2  times a day., Disp: 240 mL, Rfl: 11   sodium chloride HYPERTONIC 3 % nebulizer solution, Take 3 mLs by nebulization 3 (three) times daily., Disp: 750 mL, Rfl: 12   umeclidinium-vilanterol (ANORO ELLIPTA) 62.5-25 MCG/ACT AEPB, Inhale 1 puff into the lungs daily., Disp: 60 each, Rfl: 12  Current Facility-Administered Medications:    diphenhydrAMINE (BENADRYL) injection 50 mg, 50 mg, Intramuscular, Once PRN, Causey, Larna Daughters, NP   EPINEPHrine (EPI-PEN) injection 0.3 mg, 0.3 mg, Intramuscular, Once PRN, Causey, Larna Daughters, NP   methylPREDNISolone sodium succinate (SOLU-MEDROL) 125 mg/2 mL injection 125 mg, 125 mg, Intramuscular, Once PRN, Axel Filler, Larna Daughters,  NP  Observations/Objective: Patient is well-developed, well-nourished in no acute distress.  Resting comfortably  at home.  Head is normocephalic, atraumatic.  No labored breathing.  Speech is clear and coherent with logical content.  Patient is alert and oriented at baseline.  Congestion and cough noted during visit  Assessment and Plan:   1. COVID-19  - nirmatrelvir/ritonavir (PAXLOVID) 20 x 150 MG & 10 x  TABS; Take 3 tablets by mouth 2 (two) times daily for 5 days. (Take nirmatrelvir 150 mg two tablets twice daily for 5 days and ritonavir 100 mg one tablet twice daily for 5 days) Patient GFR is 109  Dispense: 30 tablet; Refill: 0 - brompheniramine-pseudoephedrine-DM 30-2-10 MG/5ML syrup; Take 5 mLs by mouth 4 (four) times daily as needed.  Dispense: 120 mL; Refill: 0 - benzonatate (TESSALON) 200 MG capsule; Take 1  capsule (200 mg total) by mouth 3 (three) times daily as needed for cough.  Dispense: 30 capsule; Refill: 0   - Continue OTC symptomatic management of choice - Info for COVID sent on AVS as well - Take prescribed medications as directed - Push fluids - Rest as needed - Discussed return precautions and when to seek in-person evaluation, sent via AVS as well  Reviewed side effects, risks and benefits of medication.    Patient acknowledged agreement and understanding of the plan.   Past Medical, Surgical, Social History, Allergies, and Medications have been Reviewed.    Follow Up Instructions: I discussed the assessment and treatment plan with the patient. The patient was provided an opportunity to ask questions and all were answered. The patient agreed with the plan and demonstrated an understanding of the instructions.  A copy of instructions were sent to the patient via MyChart unless otherwise noted below.    The patient was advised to call back or seek an in-person evaluation if the symptoms worsen or if the condition fails to improve as anticipated.  Time:  I spent 10 minutes with the patient via telehealth technology discussing the above problems/concerns.    Jesse Finner, NP

## 2023-02-17 ENCOUNTER — Ambulatory Visit: Payer: Commercial Managed Care - PPO | Admitting: Family Medicine

## 2023-02-18 ENCOUNTER — Encounter: Payer: Commercial Managed Care - PPO | Admitting: Physical Therapy

## 2023-02-20 ENCOUNTER — Encounter: Payer: Commercial Managed Care - PPO | Admitting: Physical Therapy

## 2023-02-24 ENCOUNTER — Encounter: Payer: Commercial Managed Care - PPO | Admitting: Physical Therapy

## 2023-03-11 ENCOUNTER — Encounter: Payer: Self-pay | Admitting: Physical Therapy

## 2023-03-11 ENCOUNTER — Ambulatory Visit (INDEPENDENT_AMBULATORY_CARE_PROVIDER_SITE_OTHER): Payer: Commercial Managed Care - PPO | Admitting: Physical Therapy

## 2023-03-11 DIAGNOSIS — M542 Cervicalgia: Secondary | ICD-10-CM

## 2023-03-11 DIAGNOSIS — M5459 Other low back pain: Secondary | ICD-10-CM | POA: Diagnosis not present

## 2023-03-11 DIAGNOSIS — M6281 Muscle weakness (generalized): Secondary | ICD-10-CM | POA: Diagnosis not present

## 2023-03-11 NOTE — Therapy (Signed)
OUTPATIENT PHYSICAL THERAPY TREATMENT NOTE, progress not and RECERT  Progress Note Reporting Period 01/07/23 to 03/11/23  See note below for Objective Data and Assessment of Progress/Goals.      Patient Name: Jesse GUTTER, [name] MRN: [id number] DOB:[birth year], 42 y.o., male Today's Date: 03/11/2023  PCP: Agapito Games, MD   REFERRING PROVIDER: Monica Becton, MD  END OF SESSION:   PT End of Session - 03/11/23 1556     Visit Number 12    Number of Visits 28    Date for PT Re-Evaluation 06/03/23    PT Start Time 1600    PT Stop Time 1642    PT Time Calculation (min) 42 min    Activity Tolerance --             Past Medical History:  Diagnosis Date   Allergy    Arthritis    Asthma    Chronic prostatitis    Myalgia    Pulmonary infiltrates    Sinusitis, chronic    Tonsillar abscess    Vasculitis (HCC)    Past Surgical History:  Procedure Laterality Date   BRONCHOSCOPY  2011   TBBX ----inflammation   IR FLUORO GUIDE CV LINE RIGHT  01/31/2017   IR FLUORO GUIDE CV MIDLINE PICC RIGHT  01/30/2017   IR US GUIDE VASC ACCESS RIGHT  01/30/2017   peritonsilar abscess     TYMPANOPLASTY     VIDEO BRONCHOSCOPY Bilateral 06/25/2018   Procedure: VIDEO BRONCHOSCOPY WITHOUT FLUORO;  Surgeon: Coralyn Helling, MD;  Location: [facility];  Service: Cardiopulmonary;  Laterality: Bilateral;   Patient Active Problem List   Diagnosis Date Noted   Testicular swelling, right 01/08/2023   Scrotal pain 01/08/2023   Radiculitis of left cervical region 12/30/2022   Left lumbar radiculitis 12/30/2022   Osteoporosis 12/30/2022   Long term current use of systemic steroids 05/17/2021   Closed fracture of nasal bones 06/19/2020   Coccygeal pain 06/16/2020   Nose injury, initial encounter 06/16/2020   Atherosclerosis of aorta (HCC) 04/12/2019   Pulmonary infiltrates    Medication monitoring encounter 03/03/2018   Pulmonary nodule seen on imaging study 01/15/2018    Microscopic polyangiitis (HCC) 03/14/2016   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   Dyshidrotic eczema 04/08/2014   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 States he has been dong his exercises and his breathing. States he has been playing his guitar and tried using a strap and that was fine. States he halved his alieve dose and is doing well. States he doesn't have as much pain, but when he has pain he responds to it by changing his position and he is able to resolve his symptoms pretty quickly. States he is still having some neuropathy. States he is careful how he moves. States he uses his breathing exercises to help with his movements and pain. States that he feels 80% better  from a constant pain status but 60-70% better from a functional/medication stand point and  is still concerned with neuropathy.  Eval: Patient reports intense neck pain that started 1 and half weeks ago.  Patient reports he was fine on Saturday where he went rollerskating and had no falls or issues but the next day his neck/shoulder/arm started to hurt.  Reports he played the guitar which increased his pain on this left side.  Reports he was given  prednisone which did not change his symptoms and was also given gabapentin but has not started that yet.  Patient has a difficult time getting comfortable and pain is just continuous in nature.  He gets slight relief with immobilization but no relief with anything else he has tried at this time.   Pain radiates into left upper arm and at times into forearm.  Prior to this episode patient was having occasional symptoms but they would resolve after a short while.  During 1 of these episode he did have some hand tingling but has not had it since.   Patient also has a current T7 compression fracture which was found during a CT scan of his chest, reports he does have pain in his lower thoracic (points to around T12) but his neck is his biggest issue at this time. Patient is right-handed but is ambidextrous and some activities.   PERTINENT HISTORY:  Osteoporosis, Wegener's granulomatosis (long term steroid use), psoriatic arthritis, T7 compression fracture   PAIN:  Are you having pain? Yes: NPRS scale: 1-2/10 Pain location: left arm/hand and back  Pain description: dull, , weakness  Aggravating factors: R ROT  Relieving factors: keeping his neck still   PRECAUTIONS: None   WEIGHT BEARING RESTRICTIONS: No   FALLS:  Has patient fallen in last 6 months? No     OCCUPATION: nuclear physicist   PLOF: Independent   PATIENT GOALS: to have less pain and be able to play guitar       OBJECTIVE:    DIAGNOSTIC FINDINGS: 12/30/22 Xray cervical spine  FINDINGS: There is no evidence of cervical spine fracture or prevertebral soft tissue swelling. Alignment is normal. Mild endplate osteophyte formation is present at C5-C6. No significant neural foraminal stenosis bilaterally. The dens is intact and lateral masses are symmetric.   IMPRESSION: Mild degenerative changes at C5-C6.   Xray Lumbar spine FINDINGS: Alignment is anatomic. Vertebral body and disc space heights are normal. No  degenerative changes. No definite pars defects.   IMPRESSION: No findings to explain the patient's pain.   CT chest 2 months ago  Musculoskeletal: Degenerative changes in the spine. Slight compression of the T7 vertebral body, unchanged. No worrisome lytic or sclerotic lesions.     COGNITION: Overall cognitive status: Within functional limits for tasks assessed  POSTURE: forward head and flat thoracic spine                   Cervical  AROM:   03/11/23     Flexion  WFL     Extension  80% limited*     R ROT  WFL     L ROT  25% limited *(staccato movement)     R SB  0% limited    L SB 25% limited (tightness)                           * Pain              (Blank rows = not tested)                  UE Measurements       Upper Extremity Right 03/11/23 Left 03/11/23    A/PROM MMT A/PROM MMT  Shoulder Flexion 150*  3+  150*  3+  Shoulder Extension  WFL  4+ WFL   4-**  Shoulder Abduction          Shoulder Adduction          Shoulder Internal Rotation Reaches to T6 SP   5  Reaches to T6 SP** 4*  Shoulder External Rotation Reaches to T2 SP   4- Reaches to T2 SP    3+*  Elbow Flexion          Elbow Extension          Wrist Flexion          Wrist Extension          Wrist Supination          Wrist Pronation          Wrist Ulnar Deviation          Wrist Radial Deviation          Grip Strength NA   NA                          (Blank rows = not tested)                       * pain in lower back (and in shoulders) - hinges at lower T spine with shoulder flexion    ** increased neuropathy        TODAY'S TREATMENT:                                                                                                                              DATE:   03/11/2023 Therapeutic Exercise: back up against the wall shoulder flexion with dowel 5 minutes, review of HEP, MMT. ROM and goals  PATIENT EDUCATION:  Education details: On HEP, on progress made,  on POC  Person educated: Patient Education method: Explanation, Demonstration, and Handouts Education  comprehension: verbalized understanding       HOME EXERCISE PROGRAM: 3/19 box breathing/pain relief strategies [id number]  - previous with long exhale breathing     ASSESSMENT:   CLINICAL IMPRESSION: 03/11/2023 Progress note performed on this date.  Patient has made significant improvements since initial evaluation and tolerated manual muscle testing as well as range of motion today.  Patient has met 3 of 4 short-term goals and is working towards long-term goals at this time.  2 additional goals were added to plan of care and extending plan of care to continue to work on functional range of motion and strength.  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  Additionally patient needs to return to playing guitar and drums and would greatly benefit from continued skilled physical therapy to improve physical impairments and quality of life.  Eval: Patient presents to physical therapy today with severe neck and left upper arm pain that started about 1.5 weeks ago.  Assessment limited secondary to high levels of pain on this date.  Greatest relief felt with cervical traction which patient was educated on as well as other pain management strategies.  Discussed current pain response and importance of addressing pain  to down regulate central nervous system.  Patient severely limited in range of motion, function, sleep and overall quality of life secondary to pain and physical limitations at this time and would greatly benefit from skilled physical therapy.   OBJECTIVE IMPAIRMENTS: decreased activity tolerance, decreased ROM, decreased strength, improper body mechanics, postural dysfunction, and pain.    ACTIVITY LIMITATIONS: lifting, bending, sitting, sleeping, dressing, reach over head, and locomotion level   PARTICIPATION LIMITATIONS: community activity and  occupation, playing guitar   PERSONAL FACTORS: 1-2 comorbidities: osteoporosis, vasculitis,  are also affecting patient's functional outcome.    REHAB POTENTIAL: Good   CLINICAL DECISION MAKING: Evolving/moderate complexity   EVALUATION COMPLEXITY: Moderate     GOALS: Goals reviewed with patient? yes   SHORT TERM GOALS: Target date: 02/04/2023  Patient will be independent in self management strategies to improve quality of life and functional outcomes. Baseline: New Program Goal status: MET   2.  Patient will report at least 50% improvement in overall symptoms and/or function to demonstrate improved functional mobility Baseline: 0% better Goal status: MET   3.  Patient will be able to find comfortable position at night to improve ability to get comfortable and fall asleep Baseline: Difficult finding comfortable position Goal status:MET   4.  Patient will demonstrate pain-free cervical range of motion in all directions Baseline: Painful Goal status: PROGRESSING        LONG TERM GOALS: Target date: 06/03/2023    Patient will report at least 75% improvement in overall symptoms and/or function to demonstrate improved functional mobility Baseline: 0% better Goal status: PROGRESSING   2.  Patient will be able to play guitar without pain or difficulties to return to prior level of function Baseline: Painful/difficult Goal status: PROGRESSING- working up hours of play and taking medicine   3.  Patient report no radicular symptoms in left upper extremity to improve quality of life. Baseline: Current radicular symptoms into left upper extremity Goal status: PROGRESSING       4. Patient will report at least 90% improvement in overall symptoms and/or function to demonstrate improved functional mobility   Current: 80% better Goal status: NEW  5. 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     Current: unable Goal status: NEW  PLAN:   PT FREQUENCY: 1-2x/week for a total of 16 visits over 12 week certification period   PT DURATION: 12 weeks   PLANNED INTERVENTIONS: Therapeutic exercises, Therapeutic activity, Neuromuscular re-education, Balance training, Gait training, Patient/Family education, Self Care, Joint mobilization, Joint manipulation, Stair training, Canalith repositioning, Orthotic/Fit training, DME instructions, Aquatic Therapy, Dry Needling, Electrical stimulation, Spinal manipulation, Spinal mobilization, Cryotherapy, Moist heat, Traction, Ionotophoresis 4mg /ml Dexamethasone, Manual therapy, and Re-evaluation.   PLAN FOR NEXT SESSION:  ROM cervical, MMT in UE, isolate motions, lumbar mobility/core, return to dance and playing drums and yoga routine     4:49 PM, 03/11/23 Tereasa Coop, DPT Physical Therapy with [facility]

## 2023-03-13 ENCOUNTER — Other Ambulatory Visit (INDEPENDENT_AMBULATORY_CARE_PROVIDER_SITE_OTHER): Payer: Commercial Managed Care - PPO

## 2023-03-13 ENCOUNTER — Other Ambulatory Visit: Payer: Self-pay

## 2023-03-13 DIAGNOSIS — M8000XS Age-related osteoporosis with current pathological fracture, unspecified site, sequela: Secondary | ICD-10-CM

## 2023-03-14 ENCOUNTER — Other Ambulatory Visit: Payer: Commercial Managed Care - PPO

## 2023-03-14 LAB — RENAL FUNCTION PANEL
Albumin: 4.4 g/dL (ref 3.5–5.2)
BUN: 21 mg/dL (ref 6–23)
CO2: 32 mEq/L (ref 19–32)
Calcium: 9.4 mg/dL (ref 8.4–10.5)
Chloride: 103 mEq/L (ref 96–112)
Creatinine, Ser: 1.05 mg/dL (ref 0.40–1.50)
GFR: 87.26 mL/min (ref >60.00)
Glucose, Bld: 80 mg/dL (ref 70–99)
Phosphorus: 3.8 mg/dL (ref 2.3–4.6)
Potassium: 4.3 mEq/L (ref 3.5–5.1)
Sodium: 142 mEq/L (ref 135–145)

## 2023-03-14 LAB — VITAMIN D 25 HYDROXY (VIT D DEFICIENCY, FRACTURES): VITD: 45.17 ng/mL (ref 30.00–100.00)

## 2023-03-14 LAB — MAGNESIUM: Magnesium: 1.8 mg/dL (ref 1.5–2.5)

## 2023-03-14 LAB — PTH, INTACT AND CALCIUM
Calcium: 9.5 mg/dL (ref 8.6–10.3)
PTH: 18 pg/mL (ref 16–77)

## 2023-03-14 NOTE — Progress Notes (Signed)
Vitamin D is technically in the normal range but on the lower end would recommend 25 mcg daily if not already taking vitamin D.

## 2023-03-16 LAB — VITAMIN D 1,25 DIHYDROXY
Vitamin D 1, 25 (OH)2 Total: 38 pg/mL (ref 18–72)
Vitamin D2 1, 25 (OH)2: <8 pg/mL
Vitamin D3 1, 25 (OH)2: 38 pg/mL

## 2023-03-18 ENCOUNTER — Other Ambulatory Visit (HOSPITAL_COMMUNITY): Payer: Self-pay

## 2023-03-18 ENCOUNTER — Other Ambulatory Visit: Payer: Self-pay

## 2023-03-18 ENCOUNTER — Other Ambulatory Visit: Payer: Self-pay | Admitting: Family Medicine

## 2023-03-19 ENCOUNTER — Encounter: Payer: Self-pay | Admitting: "Endocrinology

## 2023-03-19 ENCOUNTER — Other Ambulatory Visit: Payer: Self-pay

## 2023-03-19 ENCOUNTER — Other Ambulatory Visit (HOSPITAL_COMMUNITY): Payer: Self-pay

## 2023-03-19 ENCOUNTER — Ambulatory Visit: Payer: Commercial Managed Care - PPO | Admitting: "Endocrinology

## 2023-03-19 VITALS — BP 126/70 | HR 86 | Ht 79.0 in | Wt 195.2 lb

## 2023-03-19 DIAGNOSIS — M818 Other osteoporosis without current pathological fracture: Secondary | ICD-10-CM | POA: Diagnosis not present

## 2023-03-19 MED ORDER — ALENDRONATE SODIUM 70 MG PO TABS
70.0000 mg | ORAL_TABLET | ORAL | 3 refills | Status: DC
  Filled 2023-03-19: qty 4, 28d supply, fill #0
  Filled 2023-04-12: qty 4, 28d supply, fill #1
  Filled 2023-05-12: qty 4, 28d supply, fill #2
  Filled 2023-06-07: qty 4, 28d supply, fill #3

## 2023-03-19 NOTE — Progress Notes (Signed)
[facility] Note Jesse Sammamish, MD    Referring Provider: Agapito Games, * Primary Care Provider: Agapito Games, MD No chief complaint on file.    Assessment & Plan  Diagnoses and all orders for this visit:  Other osteoporosis without current pathological fracture  Other orders -     alendronate (FOSAMAX) 70 MG tablet; Take 1 tablet (70 mg total) by mouth every 7 (seven) days. Take with a full glass of water on an empty stomach.     Osteoporosis Likely secondary cause from steroids intake. History of ANCA vasculitis, long term use of steroids, psoriatic arthritis. Has sued fosamax years ago for 2 years. Has mild GERD but open to fosamax. Discussed other treatment options as well including zoledronic acid and teriparatide. 04/2022 The BMD measured at Femur Neck Left is 0.716 g/cm2 with a Z-score of -2.4. Recommend to use calcium carbonate 600 mg twice daily and vitamin D 2000 units OTC supplements.  Recommend weight bearing exercise options and dietary supplements.   Educated on risks and side effects of fosamax including but not limited to esophagitis, worsening GERD, atypical femoral fractures and osteonecrosis of the jaw. Advised to take medication first thing in the morning with plenty of water and stay upright for 30 minutes after taking the medication.  Advised fall precautions, adequate dairy in diet and exercises (aerobic, balancing and weight bearing) as tolerated.   Return in about 3 months (around 06/19/2023) for visit.  I have reviewed current medications, nurse's notes, allergies, vital signs, past medical and surgical history, family medical history, and social history for this encounter. Counseled patient on symptoms, examination findings, lab findings, imaging results, treatment decisions and monitoring and prognosis. The patient understood the recommendations and agrees with the treatment plan. All questions regarding treatment plan  were fully answered.   Jesse [place], MD   03/19/23    History of Present Illness Jesse Kyle, [name] is a 42 y.o. year old male who presents to our clinic with osteoporosis diagnosed in 2015.  She is currently taking Calcium 500 mg once daily and vitamin D 1000 international units twice daily.  Risk Factors screening:  History of low trauma fractures: No Family history of osteoporosis: Yes Hip fracture in first-degree relatives: No Smoking history: No Excessive alcohol intake >2 drinks/day: No Excessive caffeine intake >2 drinks/day: No Glucocorticoid use >5mg  prednisone/day for >3 months: Yes: has ANCA vasculitis, had 1 gm solumedrol previously   Rheumatoid arthritis history: No   Physical Exam  BP 126/70   Pulse 86   Ht 6\' 7"  (2.007 m)   Wt 195 lb 3.2 oz (88.5 kg)   SpO2 96%   BMI 21.99 kg/m  Constitutional: well developed, well nourished Head: normocephalic, atraumatic Eyes: sclera anicteric, no redness Neck: supple Lungs: normal respiratory effort Neurology: alert and oriented Skin: dry, no appreciable rashes Musculoskeletal: no appreciable defects Psychiatric: normal mood and affect  Allergies Allergies  Allergen Reactions   Gabapentin Other (See Comments)    Tinnitis, and hearing loss   Lipitor [Atorvastatin] Other (See Comments)    myalgias   Ruxience [Rituximab-Pvvr] Itching    Itching in back of throat and nose     Current Medications Patient's Medications  New Prescriptions   ALENDRONATE (FOSAMAX) 70 MG TABLET    Take 1 tablet (70 mg total) by mouth every 7 (seven) days. Take with a full glass of water on an empty stomach.  Previous Medications   ALBUTEROL (PROVENTIL) (2.5 MG/3ML)  0.083% NEBULIZER SOLUTION    Inhale 1 vial via nebulizer every 6 hours as needed for wheezing.   ALBUTEROL (VENTOLIN HFA) 108 (90 BASE) MCG/ACT INHALER    Inhale 2 puffs into the lungs every 6 (six) hours as needed for wheezing or shortness of breath.    BUDESONIDE-FORMOTEROL (SYMBICORT) 160-4.5 MCG/ACT INHALER    Inhale 2 puffs into the lungs 2 (two) times daily.   CALCIUM PO    Take 1 tablet by mouth 2 (two) times daily.   CHOLECALCIFEROL (VITAMIN D) 1000 UNITS TABLET    Take 1 tablet (1,000 Units total) by mouth daily.   FLUTICASONE (FLONASE) 50 MCG/ACT NASAL SPRAY    INSTILL 2 SPRAYS INTO BOTH NOSTRILS DAILY (NEED APPT FOR FURTHER REFILLS)   MULTIPLE VITAMIN (MULTIVITAMIN WITH MINERALS) TABS TABLET    Take 1 tablet by mouth 2 (two) times daily.   MUPIROCIN OINTMENT (BACTROBAN) 2 %    Use with nasal rinse bottle   NAPROXEN (NAPROSYN) 500 MG TABLET    Take 1 tablet (500 mg total) by mouth 2 (two) times daily with a meal.   NEBULIZERS (COMPRESSOR/NEBULIZER) MISC    Use as direccted   OMEPRAZOLE (PRILOSEC) 20 MG CAPSULE    Take 1 capsule (20 mg total) by mouth daily.   ONDANSETRON (ZOFRAN) 4 MG TABLET    Take 1 tablet (4 mg total) by mouth every 8 (eight) hours as needed for nausea or vomiting.   PRAVASTATIN (PRAVACHOL) 40 MG TABLET    Take 1 tablet by mouth daily.   PREGABALIN (LYRICA) 50 MG CAPSULE    Take 1 capsule (50 mg total) by mouth every evening for 7 days, THEN 1 capsule (50 mg total) 2 (two) times daily for 7 days, THEN 1 capsule (50 mg total) 3 (three) times daily for 23 days.   SODIUM CHLORIDE HYPERTONIC 3 % NEBULIZER SOLUTION    Take 3 mLs by nebulization 3 (three) times daily.   UMECLIDINIUM-VILANTEROL (ANORO ELLIPTA) 62.5-25 MCG/ACT AEPB    Inhale 1 puff into the lungs daily.  Modified Medications   No medications on file  Discontinued Medications   No medications on file     Past Medical History Past Medical History:  Diagnosis Date   Allergy    Arthritis    Asthma    Chronic prostatitis    Myalgia    Pulmonary infiltrates    Sinusitis, chronic    Tonsillar abscess    Vasculitis (HCC)     Past Surgical History Past Surgical History:  Procedure Laterality Date   BRONCHOSCOPY  2011   TBBX ----inflammation   IR  FLUORO GUIDE CV LINE RIGHT  01/31/2017   IR FLUORO GUIDE CV MIDLINE PICC RIGHT  01/30/2017   IR US GUIDE VASC ACCESS RIGHT  01/30/2017   peritonsilar abscess     TYMPANOPLASTY     VIDEO BRONCHOSCOPY Bilateral 06/25/2018   Procedure: VIDEO BRONCHOSCOPY WITHOUT FLUORO;  Surgeon: Coralyn Helling, MD;  Location: [facility];  Service: Cardiopulmonary;  Laterality: Bilateral;    Family History family history includes Heart attack in an other family member; Heart disease in his paternal grandfather; Hyperlipidemia in his father; Thyroid disease in his father.  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  Other Topics Concern   Not on file  Social History Narrative   Not on file   Social Determinants of Health   Financial Resource Strain: Not on file  Food Insecurity: Not on file  Transportation Needs: Not on file  Physical Activity: Not on file  Stress: Not on file  Social Connections: Not on file  Intimate Partner Violence: Not on file    Laboratory Investigations No components found for: "CMP" No components found for: "BMP" Lab Results  Component Value Date   GFR 87.26 03/13/2023   Lab Results  Component Value Date   CREATININE 1.05 03/13/2023   No results found for: "CBC" No components found for: "LFT" No components found for: "VITD" Lab Results  Component Value Date   PTH 18 03/13/2023   Lab Results  Component Value Date   TSH 3.75  04/12/2019   No components found for: "RENAL FUNCTION" No components found for: "MAGNESIUM"  Parts of this note may have been dictated using voice recognition software. There may be variances in spelling and vocabulary which are unintentional. Not all errors are proofread. Please notify the [facility] Parkin if any discrepancies are noted or if the meaning of any statement is not clear.  Physical Exam  BP 126/70   Pulse 86   Ht 6\' 7"  (2.007 m)   Wt 195 lb 3.2 oz (88.5 kg)   SpO2 96%   BMI 21.99 kg/m    Constitutional: well developed, well nourished Head: normocephalic, atraumatic Eyes: sclera anicteric, no redness Neck: supple Lungs: normal respiratory effort Neurology: alert and oriented Skin: dry, no appreciable rashes Musculoskeletal: no appreciable defects Psychiatric: normal mood and affect   Current Medications Patient's Medications  New Prescriptions   ALENDRONATE (FOSAMAX) 70 MG TABLET    Take 1 tablet (70 mg total) by mouth every 7 (seven) days. Take with a full glass of water on an empty stomach.  Previous Medications   ALBUTEROL (PROVENTIL) (2.5 MG/3ML) 0.083% NEBULIZER SOLUTION    Inhale 1 vial via nebulizer every 6 hours as needed for wheezing.   ALBUTEROL (VENTOLIN HFA) 108 (90 BASE) MCG/ACT INHALER    Inhale 2 puffs into the lungs every 6 (six) hours as needed for wheezing or shortness of breath.   BUDESONIDE-FORMOTEROL (SYMBICORT) 160-4.5 MCG/ACT INHALER    Inhale 2 puffs into the lungs 2 (two) times daily.   CALCIUM PO    Take 1 tablet by mouth 2 (two) times daily.   CHOLECALCIFEROL (VITAMIN D) 1000 UNITS TABLET    Take 1 tablet (1,000 Units total) by mouth daily.   FLUTICASONE (FLONASE) 50 MCG/ACT NASAL SPRAY    INSTILL 2 SPRAYS INTO BOTH NOSTRILS DAILY (NEED APPT FOR FURTHER REFILLS)   MULTIPLE VITAMIN (MULTIVITAMIN WITH MINERALS) TABS TABLET    Take 1 tablet by mouth 2 (two) times daily.   MUPIROCIN OINTMENT (BACTROBAN) 2 %    Use with nasal rinse bottle   NAPROXEN  (NAPROSYN) 500 MG TABLET    Take 1 tablet (500 mg total) by mouth 2 (two) times daily with a meal.   NEBULIZERS (COMPRESSOR/NEBULIZER) MISC    Use  as direccted   OMEPRAZOLE (PRILOSEC) 20 MG CAPSULE    Take 1 capsule (20 mg total) by mouth daily.   ONDANSETRON (ZOFRAN) 4 MG TABLET    Take 1 tablet (4 mg total) by mouth every 8 (eight) hours as needed for nausea or vomiting.   PRAVASTATIN (PRAVACHOL) 40 MG TABLET    Take 1 tablet by mouth daily.   PREGABALIN (LYRICA) 50 MG CAPSULE    Take 1 capsule (50 mg total) by mouth every evening for 7 days, THEN 1 capsule (50 mg total) 2 (two) times daily for 7 days, THEN 1 capsule (50 mg total) 3 (three) times daily for 23 days.   SODIUM CHLORIDE HYPERTONIC 3 % NEBULIZER SOLUTION    Take 3 mLs by nebulization 3 (three) times daily.   UMECLIDINIUM-VILANTEROL (ANORO ELLIPTA) 62.5-25 MCG/ACT AEPB    Inhale 1 puff into the lungs daily.  Modified Medications   No medications on file  Discontinued Medications   No medications on file    Allergies Allergies  Allergen Reactions   Gabapentin Other (See Comments)    Tinnitis, and hearing loss   Lipitor [Atorvastatin] Other (See Comments)    myalgias   Ruxience [Rituximab-Pvvr] Itching    Itching in back of throat and nose     Past Medical History Past Medical History:  Diagnosis Date   Allergy    Arthritis    Asthma    Chronic prostatitis    Myalgia    Pulmonary infiltrates    Sinusitis, chronic    Tonsillar abscess    Vasculitis (HCC)     Past Surgical History Past Surgical History:  Procedure Laterality Date   BRONCHOSCOPY  2011   TBBX ----inflammation   IR FLUORO GUIDE CV LINE RIGHT  01/31/2017   IR FLUORO GUIDE CV MIDLINE PICC RIGHT  01/30/2017   IR US GUIDE VASC ACCESS RIGHT  01/30/2017   peritonsilar abscess     TYMPANOPLASTY     VIDEO BRONCHOSCOPY Bilateral 06/25/2018   Procedure: VIDEO BRONCHOSCOPY WITHOUT FLUORO;  Surgeon: Coralyn Helling, MD;  Location: [facility];  Service:  Cardiopulmonary;  Laterality: Bilateral;    Family History family history includes Heart attack in an other family member; Heart disease in his paternal grandfather; Hyperlipidemia in his father; Thyroid disease in his father.  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  Other Topics Concern   Not on file  Social History Narrative   Not on file   Social Determinants of Health   Financial Resource Strain: Not on file  Food Insecurity: Not on file  Transportation Needs: Not on file  Physical Activity: Not on file  Stress: Not on file  Social Connections: Not on file  Intimate Partner Violence: Not on file    Lab Results  Component Value Date   CHOL 151 05/09/2022   Lab  Results  Component Value Date   HDL 36 (L) 05/09/2022   Lab Results  Component Value Date   LDLCALC 91 05/09/2022   Lab Results  Component Value Date   TRIG 142 05/09/2022   Lab Results  Component Value Date   CHOLHDL 4.2 05/09/2022   Lab Results  Component Value Date   CREATININE 1.05 03/13/2023   Lab Results  Component Value Date   GFR 87.26 03/13/2023      Component Value Date/Time   NA 142 03/13/2023 1543   NA 145 03/03/2017 0000   K 4.3 03/13/2023 1543   CL 103 03/13/2023 1543   CL 95 02/03/2017 0000   CO2 32 03/13/2023 1543   CO2 25 02/03/2017 0000   GLUCOSE 80 03/13/2023 1543   BUN 21 03/13/2023 1543   BUN 16 03/03/2017 0000   CREATININE  1.05 03/13/2023 1543   CREATININE 0.90 10/24/2022 1337   CALCIUM 9.5 03/13/2023 1543   CALCIUM 9.4 03/13/2023 1543   CALCIUM 9.7 02/03/2017 0000   PROT 7.3 10/24/2022 1337   ALBUMIN 4.4 03/13/2023 1543   AST 33 10/24/2022 1337   ALT 47 (H) 10/24/2022 1337   ALKPHOS 54 12/04/2020 0848   BILITOT 0.9 10/24/2022 1337   GFRNONAA >60 12/04/2020 0848   GFRNONAA 108 06/16/2020 1708   GFRAA 125 06/16/2020 1708      Latest Ref Rng & Units 03/13/2023    3:43 PM 10/24/2022    1:37 PM 05/09/2022   12:00 AM  BMP  Glucose 70 - 99 mg/dL 80  88  88   BUN 6 - 23 mg/dL 21  14  12    Creatinine 0.40 - 1.50 mg/dL 6.21  3.08  6.57   BUN/Creat Ratio 6 - 22 (calc)  SEE NOTE:  NOT APPLICABLE   Sodium 135 - 145 mEq/L 142  141  139   Potassium 3.5 - 5.1 mEq/L 4.3  4.2  4.4   Chloride 96 - 112 mEq/L 103  103  101   CO2 19 - 32 mEq/L 32  32  29   Calcium 8.6 - 10.3 mg/dL 8.4 - 84.6 mg/dL 9.5    9.4  9.2  9.3        Component Value Date/Time   WBC 5.5 10/24/2022 1337   RBC 5.26 10/24/2022 1337   HGB 16.6 10/24/2022 1337   HCT 47.4 10/24/2022 1337   PLT 240 10/24/2022 1337   MCV 90.1 10/24/2022 1337   MCH 31.6 10/24/2022 1337   MCHC 35.0 10/24/2022 1337   RDW 12.0 10/24/2022 1337   LYMPHSABS 1,100 10/24/2022 1337   MONOABS 0.5 12/04/2020 0848   EOSABS 253 10/24/2022 1337   BASOSABS 28 10/24/2022 1337   Lab Results  Component Value Date   TSH 3.75 04/12/2019   TSH 1.76 06/20/2016         Parts of this note may have been dictated using voice recognition software. There may be variances in spelling and vocabulary which are unintentional. Not all errors are proofread. Please notify the [facility] Parkin if any discrepancies are noted or if the meaning of any statement is not clear.

## 2023-03-20 ENCOUNTER — Ambulatory Visit (INDEPENDENT_AMBULATORY_CARE_PROVIDER_SITE_OTHER): Payer: Commercial Managed Care - PPO | Admitting: Physical Therapy

## 2023-03-20 ENCOUNTER — Encounter: Payer: Self-pay | Admitting: Physical Therapy

## 2023-03-20 DIAGNOSIS — M5459 Other low back pain: Secondary | ICD-10-CM

## 2023-03-20 DIAGNOSIS — M6281 Muscle weakness (generalized): Secondary | ICD-10-CM

## 2023-03-20 DIAGNOSIS — M542 Cervicalgia: Secondary | ICD-10-CM | POA: Diagnosis not present

## 2023-03-20 NOTE — Therapy (Signed)
OUTPATIENT PHYSICAL THERAPY TREATMENT NOTE     Patient Name: Jesse DOWTY, [name] MRN: [id number] DOB:[birth year], 42 y.o., male Today's Date: 03/20/2023  PCP: Agapito Games, MD   REFERRING PROVIDER: Monica Becton, MD  END OF SESSION:   PT End of Session - 03/20/23 1341     Visit Number 13    Number of Visits 28    Date for PT Re-Evaluation 06/03/23    PT Start Time 1345    PT Stop Time 1425    PT Time Calculation (min) 40 min             Past Medical History:  Diagnosis Date   Allergy    Arthritis    Asthma    Chronic prostatitis    Myalgia    Pulmonary infiltrates    Sinusitis, chronic    Tonsillar abscess    Vasculitis (HCC)    Past Surgical History:  Procedure Laterality Date   BRONCHOSCOPY  2011   TBBX ----inflammation   IR FLUORO GUIDE CV LINE RIGHT  01/31/2017   IR FLUORO GUIDE CV MIDLINE PICC RIGHT  01/30/2017   IR US GUIDE VASC ACCESS RIGHT  01/30/2017   peritonsilar abscess     TYMPANOPLASTY     VIDEO BRONCHOSCOPY Bilateral 06/25/2018   Procedure: VIDEO BRONCHOSCOPY WITHOUT FLUORO;  Surgeon: Coralyn Helling, MD;  Location: [facility];  Service: Cardiopulmonary;  Laterality: Bilateral;   Patient Active Problem List   Diagnosis Date Noted   Testicular swelling, right 01/08/2023   Scrotal pain 01/08/2023   Radiculitis of left cervical region 12/30/2022   Left lumbar radiculitis 12/30/2022   Osteoporosis 12/30/2022   Long term current use of systemic steroids 05/17/2021   Closed fracture of nasal bones 06/19/2020   Coccygeal pain 06/16/2020   Nose injury, initial encounter 06/16/2020   Atherosclerosis of aorta (HCC) 04/12/2019   Pulmonary infiltrates    Medication monitoring encounter 03/03/2018   Pulmonary nodule seen on imaging study 01/15/2018   Microscopic polyangiitis (HCC) 03/14/2016   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   Dyshidrotic eczema 04/08/2014   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 States he is slowly reducing his medication frequency and his neuropathy is getting better. States he has some pain with some of the band exercises. States the pain doesn't last more than a day.   Eval: Patient reports intense neck pain that started 1 and half weeks ago.  Patient reports he was fine on Saturday where he went rollerskating and had no falls or issues but the next day his neck/shoulder/arm started to hurt.  Reports he played the guitar which increased his pain on this left side.  Reports he was given prednisone which did not change his symptoms and was also given gabapentin but has not started that yet.  Patient has a difficult time getting comfortable and pain is just continuous in nature.  He gets slight relief with immobilization but no relief with anything else he has tried at this time.   Pain radiates into left upper arm and at times into forearm.  Prior to this episode patient was having occasional symptoms but they would resolve after a short while.  During 1 of these episode he did have some hand tingling but has not had it since.   Patient also has a current T7 compression fracture which was found during  a CT scan of his chest, reports he does have pain in his lower thoracic (points to around T12) but his neck is his biggest issue at this time. Patient is right-handed but is ambidextrous and some activities.   PERTINENT HISTORY:  Osteoporosis, Wegener's granulomatosis (long term steroid use), psoriatic arthritis, T7 compression fracture   PAIN:  Are you having pain? Yes: NPRS scale: 1-2/10 Pain location: left arm/hand and back  Pain description: dull, , weakness  Aggravating factors: R ROT  Relieving factors: keeping his neck still   PRECAUTIONS: None   WEIGHT BEARING RESTRICTIONS: No   FALLS:  Has patient fallen in last 6 months? No     OCCUPATION: nuclear physicist   PLOF: Independent   PATIENT GOALS: to have less pain and be able to play guitar       OBJECTIVE:    DIAGNOSTIC FINDINGS: 12/30/22 Xray cervical spine  FINDINGS: There is no evidence of cervical spine fracture or prevertebral soft tissue swelling. Alignment is normal. Mild endplate osteophyte formation is present at C5-C6. No significant neural foraminal stenosis bilaterally. The dens is intact and lateral masses are symmetric.   IMPRESSION: Mild degenerative changes at C5-C6.   Xray Lumbar spine FINDINGS: Alignment is anatomic. Vertebral body and disc space heights are normal. No degenerative changes. No definite pars defects.   IMPRESSION: No findings to explain the patient's pain.   CT chest 2 months ago  Musculoskeletal: Degenerative changes in the spine. Slight compression of the T7 vertebral body, unchanged. No worrisome lytic or sclerotic lesions.     COGNITION: Overall cognitive status: Within functional limits for tasks assessed                                  POSTURE: forward head and flat thoracic spine                   Cervical  AROM:   03/11/23     Flexion  WFL     Extension  80% limited*     R ROT  WFL     L ROT  25% limited *(staccato movement)     R SB  0%  limited    L SB 25% limited (tightness)                           * Pain              (Blank rows = not tested)                  UE Measurements       Upper Extremity Right 03/11/23 Left 03/11/23    A/PROM MMT A/PROM MMT  Shoulder Flexion 150*  3+  150*  3+  Shoulder Extension  WFL  4+ WFL   4-**  Shoulder Abduction          Shoulder Adduction          Shoulder Internal Rotation Reaches to T6 SP   5  Reaches to T6 SP** 4*  Shoulder External Rotation Reaches to T2 SP   4- Reaches to T2 SP    3+*  Elbow Flexion          Elbow Extension          Wrist Flexion          Wrist Extension          Wrist Supination          Wrist Pronation          Wrist Ulnar Deviation          Wrist Radial Deviation          Grip Strength NA   NA                          (Blank rows = not tested)                       * pain in lower back (and in shoulders) - hinges at lower T spine with shoulder flexion    ** increased neuropathy        TODAY'S TREATMENT:                                                                                                                              DATE:   03/20/2023  Therapeutic Exercise:  Aerobic: Supine: Quad:   Seated:SNAGs cervical extension and ROT to pain free ROM - focus on upper cervical ROM - 12 minutes   Standing: horizontal shoulder abd green TB - x20 - focus on not moving neck - back up against the wall, review of HEP with Mya pose at wall - 6 minutes  Neuromuscular Re-education: quad: isolated neck motion in cat going into extension - tactile cues for segmental motion - 12 minutes Manual Therapy: Therapeutic Activity: Self Care: Trigger Point Dry Needling:  Modalities:     PATIENT EDUCATION:  Education details: On HEP, on  segmental motion, using proprioceptive strategies (tactile cues, proprioceptive tape, spiky balls) and mental practice to improve isolated motion to increased neck extension in upper cervical  Person educated:  Patient Education method: Explanation, Demonstration, and Handouts Education comprehension: verbalized understanding       HOME EXERCISE PROGRAM: 3/19 box breathing/pain relief strategies [id number]  - previous with long exhale breathing     ASSESSMENT:   CLINICAL IMPRESSION: 03/20/2023 Overall patient is doing very well and demonstrated improved tolerance to exercises on this date.  Patient also demonstrated improved cervical extension and rotation compared to previous sessions with the focus on improving upper cervical motion as he has a tendency to rely on lower cervical mobility with all neck motions.  Tolerated cat and dog exercise with PT tactile assistance.  Discussed other strategies to improve proprioceptive awareness and segmental motion including mental practice prior to performing an exercise.  Slight increase in neuropathy but this quickly resolved after stopping exercise.  No increase in symptoms noted end of session.  Will continue with current plan of care as tolerated.  Eval: Patient presents to physical therapy today with severe neck and left upper arm pain that started about 1.5 weeks ago.  Assessment limited secondary to high levels of pain on this date.  Greatest relief felt with cervical traction which patient was educated on as well as other pain management strategies.  Discussed current pain response and importance of addressing pain  to down regulate central nervous system.  Patient severely limited in range of motion, function, sleep and overall quality of life secondary to pain and physical limitations at this time and would greatly benefit from skilled physical therapy.   OBJECTIVE IMPAIRMENTS: decreased activity tolerance, decreased ROM, decreased strength, improper body mechanics, postural dysfunction, and pain.    ACTIVITY LIMITATIONS: lifting, bending, sitting, sleeping, dressing, reach over head, and locomotion level   PARTICIPATION LIMITATIONS: community activity  and occupation, playing guitar   PERSONAL FACTORS: 1-2 comorbidities: osteoporosis, vasculitis,  are also affecting patient's functional outcome.    REHAB POTENTIAL: Good   CLINICAL DECISION MAKING: Evolving/moderate complexity   EVALUATION COMPLEXITY: Moderate     GOALS: Goals reviewed with patient? yes   SHORT TERM GOALS: Target date: 02/04/2023  Patient will be independent in self management strategies to improve quality of life and functional outcomes. Baseline: New Program Goal status: MET   2.  Patient will report at least 50% improvement in overall symptoms and/or function to demonstrate improved functional mobility Baseline: 0% better Goal status: MET   3.  Patient will be able to find comfortable position at night to improve ability to get comfortable and fall asleep Baseline: Difficult finding comfortable position Goal status:MET   4.  Patient will demonstrate pain-free cervical range of motion in all directions Baseline: Painful Goal status: PROGRESSING        LONG TERM GOALS: Target date: 06/03/2023    Patient will report at least 75% improvement in overall symptoms and/or function to demonstrate improved functional mobility Baseline: 0% better Goal status: PROGRESSING   2.  Patient will be able to play guitar without pain or difficulties to return to prior level of function Baseline: Painful/difficult Goal status: PROGRESSING- working up hours of play and taking medicine   3.  Patient report no radicular symptoms in left upper extremity to improve quality of life. Baseline: Current radicular symptoms into left upper extremity Goal status: PROGRESSING       4. Patient will report at least 90% improvement in overall  symptoms and/or function to demonstrate improved functional mobility   Current: 80% better Goal status: NEW  5. 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    Current: unable Goal status: NEW  PLAN:   PT FREQUENCY: 1-2x/week for a total of 16 visits over 12 week certification period   PT DURATION: 12 weeks   PLANNED INTERVENTIONS: Therapeutic exercises, Therapeutic activity, Neuromuscular re-education, Balance training, Gait training, Patient/Family education, Self Care, Joint mobilization, Joint manipulation, Stair training, Canalith repositioning, Orthotic/Fit training, DME instructions, Aquatic Therapy, Dry Needling, Electrical stimulation, Spinal manipulation, Spinal mobilization, Cryotherapy, Moist heat, Traction, Ionotophoresis 4mg /ml Dexamethasone, Manual therapy, and Re-evaluation.   PLAN FOR NEXT SESSION:  ROM cervical, MMT in UE, isolate motions, lumbar mobility/core, return to dance and playing drums and yoga routine     2:45 PM, 03/20/23 Tereasa Coop, DPT Physical Therapy with [facility]

## 2023-04-03 ENCOUNTER — Encounter: Payer: Self-pay | Admitting: Physical Therapy

## 2023-04-03 ENCOUNTER — Ambulatory Visit (INDEPENDENT_AMBULATORY_CARE_PROVIDER_SITE_OTHER): Payer: Commercial Managed Care - PPO | Admitting: Physical Therapy

## 2023-04-03 DIAGNOSIS — M6281 Muscle weakness (generalized): Secondary | ICD-10-CM

## 2023-04-03 DIAGNOSIS — M5459 Other low back pain: Secondary | ICD-10-CM | POA: Diagnosis not present

## 2023-04-03 DIAGNOSIS — M542 Cervicalgia: Secondary | ICD-10-CM

## 2023-04-03 NOTE — Therapy (Signed)
OUTPATIENT PHYSICAL THERAPY TREATMENT NOTE     Patient Name: Jesse CELMER, [name] MRN: [id number] DOB:10/29/1980, 42 y.o., male Today's Date: 04/03/2023  PCP: Agapito Games, MD   REFERRING PROVIDER: Monica Becton, MD  END OF SESSION:   PT End of Session - 04/03/23 0925     Visit Number 14    Number of Visits 28    Date for PT Re-Evaluation 06/03/23    PT Start Time 0930    PT Stop Time 1010    PT Time Calculation (min) 40 min             Past Medical History:  Diagnosis Date   Allergy    Arthritis    Asthma    Chronic prostatitis    Myalgia    Pulmonary infiltrates    Sinusitis, chronic    Tonsillar abscess    Vasculitis (HCC)    Past Surgical History:  Procedure Laterality Date   BRONCHOSCOPY  2011   TBBX ----inflammation   IR FLUORO GUIDE CV LINE RIGHT  01/31/2017   IR FLUORO GUIDE CV MIDLINE PICC RIGHT  01/30/2017   IR US GUIDE VASC ACCESS RIGHT  01/30/2017   peritonsilar abscess     TYMPANOPLASTY     VIDEO BRONCHOSCOPY Bilateral 06/25/2018   Procedure: VIDEO BRONCHOSCOPY WITHOUT FLUORO;  Surgeon: Coralyn Helling, MD;  Location: [facility];  Service: Cardiopulmonary;  Laterality: Bilateral;   Patient Active Problem List   Diagnosis Date Noted   Testicular swelling, right 01/08/2023   Scrotal pain 01/08/2023   Radiculitis of left cervical region 12/30/2022   Left lumbar radiculitis 12/30/2022   Osteoporosis 12/30/2022   Long term current use of systemic steroids 05/17/2021   Closed fracture of nasal bones 06/19/2020   Coccygeal pain 06/16/2020   Nose injury, initial encounter 06/16/2020   Atherosclerosis of aorta (HCC) 04/12/2019   Pulmonary infiltrates    Medication monitoring encounter 03/03/2018   Pulmonary nodule seen on imaging study 01/15/2018   Microscopic polyangiitis (HCC) 03/14/2016   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   Dyshidrotic eczema 04/08/2014   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   MYALGIA  06/28/2010   GERD 06/27/2010   Nonspecific (abnormal) findings on radiological and other examination of body structure 05/29/2010   CT, CHEST, ABNORMAL 05/29/2010   Asthma with bronchitis 04/09/2010   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 States he did all his exercises. States he was able to drive better without needed left arm support. Been playing the guitar but hasn't been able to return to the drums yet. Neuropathy is way down and it has been several days. Still taking nerve medication but has weaned from his other medications.  Eval: Patient reports intense neck pain that started 1 and half weeks ago.  Patient reports he was fine on Saturday where he went rollerskating and had no falls or issues but the next day his neck/shoulder/arm started to hurt.  Reports he played the guitar which increased his pain on this left side.  Reports he was given prednisone which did not change his symptoms and was also given gabapentin but has not started that yet.  Patient has a difficult time getting comfortable and pain is just continuous in nature.  He gets slight relief with immobilization but no relief with anything else he has tried at this time.   Pain radiates into left upper arm and at times into forearm.  Prior to this episode patient was having occasional symptoms but they would resolve after a short while.  During 1 of these episode he  did have some hand tingling but has not had it since.   Patient also has a current T7 compression fracture which was found during a CT scan of his chest, reports he does have pain in his lower thoracic (points to around T12) but his neck is his biggest issue at this time. Patient is right-handed but is ambidextrous and some activities.   PERTINENT HISTORY:  Osteoporosis, Wegener's granulomatosis (long term steroid use), psoriatic arthritis, T7 compression fracture   PAIN:  Are you having pain? Yes: NPRS scale: 0/10    PRECAUTIONS: None   WEIGHT BEARING RESTRICTIONS: No   FALLS:  Has patient fallen in last 6 months? No     OCCUPATION: nuclear physicist   PLOF: Independent   PATIENT GOALS: to have less pain and be able to play guitar       OBJECTIVE:    DIAGNOSTIC FINDINGS: 12/30/22 Xray cervical spine  FINDINGS: There is no evidence of cervical spine fracture or prevertebral soft tissue swelling. Alignment is normal. Mild endplate osteophyte formation is present at C5-C6. No significant neural foraminal stenosis bilaterally. The dens is intact and lateral masses are symmetric.   IMPRESSION: Mild degenerative changes at C5-C6.   Xray Lumbar spine FINDINGS: Alignment is anatomic. Vertebral body and disc space heights are normal. No degenerative changes. No definite pars defects.   IMPRESSION: No findings to explain the patient's pain.   CT chest 2 months ago  Musculoskeletal: Degenerative changes in the spine. Slight compression of the T7 vertebral body, unchanged. No worrisome lytic or sclerotic lesions.     COGNITION: Overall cognitive status: Within functional limits for tasks assessed                                  POSTURE: forward head and flat thoracic spine                   Cervical  AROM:   03/11/23     Flexion  WFL     Extension  80% limited*     R ROT  WFL     L ROT  25%  limited *(staccato movement)     R SB  0% limited    L SB 25%  limited (tightness)                           * Pain              (Blank rows = not tested)                  UE Measurements       Upper Extremity Right 03/11/23 Left 03/11/23    A/PROM MMT A/PROM MMT  Shoulder Flexion 150*  3+  150*  3+  Shoulder Extension  WFL  4+ WFL   4-**  Shoulder Abduction          Shoulder Adduction          Shoulder Internal Rotation Reaches to T6 SP   5  Reaches to T6 SP** 4*  Shoulder External Rotation Reaches to T2 SP   4- Reaches to T2 SP    3+*  Elbow Flexion          Elbow Extension          Wrist Flexion          Wrist Extension          Wrist Supination          Wrist Pronation          Wrist Ulnar Deviation          Wrist Radial Deviation          Grip Strength NA   NA                          (Blank rows = not tested)                       * pain in lower back (and in shoulders) - hinges at lower T spine with shoulder flexion    ** increased neuropathy        TODAY'S TREATMENT:                                                                                                                              DATE:   04/03/2023  Therapeutic Exercise:  Aerobic: Supine: Quad:   Seated:  Standing: horizontal shoulder abd green TB - x 10 with band folded on each other and posterior wall support, field goal position with forms on wall focusing on scapular protraction and shoulder external rotation 10 minutes, field goal position with green band around wrists for shoulder external rotation times fifteen 5 to 10-second holds Neuromuscular Re-education: Sitting on exercise ball-pelvic tilts anterior/posterior/lateral, marches/kicks for balance and static holds 10 minutes total Therapeutic Activity: Self Care: Trigger Point Dry Needling:  Modalities:     PATIENT EDUCATION:  Education details: Reviewed home exercise program as  well as plan moving forward and addition of functional activities focusing on posture Person educated: Patient Education  method: Explanation, Demonstration, and Handouts Education comprehension: verbalized understanding       HOME EXERCISE PROGRAM: 3/19 box breathing/pain relief strategies [id number]  - previous with long exhale breathing     ASSESSMENT:   CLINICAL IMPRESSION: 04/03/2023 Session focused on answering all questions as well as progression of exercises.  Discussed use of ball at work to continue to work on active postures throughout the day and reduce poor postural habits.  Progressed standing exercises with forearms on wall and feels goal position.  This was tolerated very well with significant stretch noted in thoracic spine.  Cues to limit extension at T12 with all standing exercises.  Also discussed importance of making regular posture modifications focused on pelvis mid back and upper back.  Patient doing very well and no pain or radicular symptoms noted during or after session.  Will continue with current plan of care as tolerated.  Eval: Patient presents to physical therapy today with severe neck and left upper arm pain that started about 1.5 weeks ago.  Assessment limited secondary to high levels of pain on this date.  Greatest relief felt with cervical traction which patient was educated on as well as other pain management strategies.  Discussed current pain response and importance of addressing pain  to down regulate central nervous system.  Patient severely limited in range of motion, function, sleep and overall quality of life secondary to pain and physical limitations at this time and would greatly benefit from skilled physical therapy.   OBJECTIVE IMPAIRMENTS: decreased activity tolerance, decreased ROM, decreased strength, improper body mechanics, postural dysfunction, and pain.    ACTIVITY LIMITATIONS: lifting, bending, sitting, sleeping, dressing, reach over head, and locomotion level   PARTICIPATION LIMITATIONS: community activity and occupation, playing guitar   PERSONAL FACTORS:  1-2 comorbidities: osteoporosis, vasculitis,  are also affecting patient's functional outcome.    REHAB POTENTIAL: Good   CLINICAL DECISION MAKING: Evolving/moderate complexity   EVALUATION COMPLEXITY: Moderate     GOALS: Goals reviewed with patient? yes   SHORT TERM GOALS: Target date: 02/04/2023  Patient will be independent in self management strategies to improve quality of life and functional outcomes. Baseline: New Program Goal status: MET   2.  Patient will report at least 50% improvement in overall symptoms and/or function to demonstrate improved functional mobility Baseline: 0% better Goal status: MET   3.  Patient will be able to find comfortable position at night to improve ability to get comfortable and fall asleep Baseline: Difficult finding comfortable position Goal status:MET   4.  Patient will demonstrate pain-free cervical range of motion in all directions Baseline: Painful Goal status: PROGRESSING        LONG TERM GOALS: Target date: 06/03/2023    Patient will report at least 75% improvement in overall symptoms and/or function to demonstrate improved functional mobility Baseline: 0% better Goal status: PROGRESSING   2.  Patient will be able to play guitar without pain or difficulties to return to prior level of function Baseline: Painful/difficult Goal status: PROGRESSING- working up hours of play and taking medicine   3.  Patient report no radicular symptoms in left upper extremity to improve quality of life. Baseline: Current radicular symptoms into left upper extremity Goal status: PROGRESSING       4. Patient will report at least 90% improvement in overall symptoms and/or function to demonstrate improved functional mobility   Current:  80% better Goal status: NEW  5. 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    Current: unable Goal status: NEW  PLAN:    PT FREQUENCY: 1-2x/week for a total of 16 visits over 12 week certification period   PT DURATION: 12 weeks   PLANNED INTERVENTIONS: Therapeutic exercises, Therapeutic activity, Neuromuscular re-education, Balance training, Gait training, Patient/Family education, Self Care, Joint mobilization, Joint manipulation, Stair training, Canalith repositioning, Orthotic/Fit training, DME instructions, Aquatic Therapy, Dry Needling, Electrical stimulation, Spinal manipulation, Spinal mobilization, Cryotherapy, Moist heat, Traction, Ionotophoresis 4mg /ml Dexamethasone, Manual therapy, and Re-evaluation.   PLAN FOR NEXT SESSION:  ROM cervical, MMT in UE, isolate motions, lumbar mobility/core, return to dance and playing drums and yoga routine     10:16 AM, 04/03/23 Tereasa Coop, DPT Physical Therapy with [facility]

## 2023-04-07 ENCOUNTER — Encounter: Payer: Self-pay | Admitting: Physical Therapy

## 2023-04-07 ENCOUNTER — Ambulatory Visit (INDEPENDENT_AMBULATORY_CARE_PROVIDER_SITE_OTHER): Payer: Commercial Managed Care - PPO | Admitting: Physical Therapy

## 2023-04-07 DIAGNOSIS — M5459 Other low back pain: Secondary | ICD-10-CM

## 2023-04-07 DIAGNOSIS — M542 Cervicalgia: Secondary | ICD-10-CM

## 2023-04-07 DIAGNOSIS — M6281 Muscle weakness (generalized): Secondary | ICD-10-CM

## 2023-04-07 NOTE — Therapy (Signed)
OUTPATIENT PHYSICAL THERAPY TREATMENT NOTE     Patient Name: Jesse WENNBERG, [name] MRN: [id number] DOB:10/20/1981, 42 y.o., male Today's Date: 04/07/2023  PCP: Agapito Games, MD   REFERRING PROVIDER: Monica Becton, MD  END OF SESSION:   PT End of Session - 04/07/23 1516     Visit Number 15    Number of Visits 28    Date for PT Re-Evaluation 06/03/23    PT Start Time 1516    PT Stop Time 1555    PT Time Calculation (min) 39 min             Past Medical History:  Diagnosis Date   Allergy    Arthritis    Asthma    Chronic prostatitis    Myalgia    Pulmonary infiltrates    Sinusitis, chronic    Tonsillar abscess    Vasculitis (HCC)    Past Surgical History:  Procedure Laterality Date   BRONCHOSCOPY  2011   TBBX ----inflammation   IR FLUORO GUIDE CV LINE RIGHT  01/31/2017   IR FLUORO GUIDE CV MIDLINE PICC RIGHT  01/30/2017   IR US GUIDE VASC ACCESS RIGHT  01/30/2017   peritonsilar abscess     TYMPANOPLASTY     VIDEO BRONCHOSCOPY Bilateral 06/25/2018   Procedure: VIDEO BRONCHOSCOPY WITHOUT FLUORO;  Surgeon: Coralyn Helling, MD;  Location: [facility];  Service: Cardiopulmonary;  Laterality: Bilateral;   Patient Active Problem List   Diagnosis Date Noted   Testicular swelling, right 01/08/2023   Scrotal pain 01/08/2023   Radiculitis of left cervical region 12/30/2022   Left lumbar radiculitis 12/30/2022   Osteoporosis 12/30/2022   Long term current use of systemic steroids 05/17/2021   Closed fracture of nasal bones 06/19/2020   Coccygeal pain 06/16/2020   Nose injury, initial encounter 06/16/2020   Atherosclerosis of aorta (HCC) 04/12/2019   Pulmonary infiltrates    Medication monitoring encounter 03/03/2018   Pulmonary nodule seen on imaging study 01/15/2018   Microscopic polyangiitis (HCC) 03/14/2016   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   Dyshidrotic eczema 04/08/2014   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 States it was in his fingers but didn't last long.  Eval: Patient reports intense neck pain that started 1 and half weeks ago.  Patient reports he was fine on Saturday where he went rollerskating and had no falls or issues but the next day his neck/shoulder/arm started to hurt.  Reports he played the guitar which increased his pain on this left side.  Reports he was given prednisone which did not change his symptoms and was also given gabapentin but has not started that yet.  Patient has a difficult time getting comfortable and pain is just continuous in nature.  He gets slight relief with immobilization but no relief with anything else he has tried at this time.   Pain radiates into left upper arm and at times into forearm.  Prior to this episode patient was having occasional symptoms but they would resolve after a short while.  During 1 of these episode he did have some hand tingling but has not had it since.   Patient also has a current T7 compression fracture which was found during a CT scan of his chest, reports he does have pain in his lower thoracic (points to around T12) but his neck is his biggest issue at this  time. Patient is right-handed but is ambidextrous and some activities.   PERTINENT HISTORY:  Osteoporosis, Wegener's granulomatosis (long term steroid use), psoriatic arthritis, T7 compression fracture   PAIN:  Are you having pain? Yes: NPRS scale: 0/10    PRECAUTIONS: None   WEIGHT BEARING RESTRICTIONS: No   FALLS:  Has patient fallen in last 6 months? No     OCCUPATION: nuclear physicist   PLOF: Independent   PATIENT GOALS: to have less pain and be able to play guitar       OBJECTIVE:    DIAGNOSTIC FINDINGS: 12/30/22 Xray cervical spine  FINDINGS: There is no evidence of cervical spine fracture or prevertebral soft tissue swelling. Alignment is normal. Mild endplate osteophyte formation is present at C5-C6. No significant neural foraminal stenosis bilaterally. The dens is intact and lateral masses are symmetric.   IMPRESSION: Mild degenerative changes at C5-C6.   Xray Lumbar spine FINDINGS: Alignment is anatomic. Vertebral body and disc space heights are normal. No degenerative changes. No definite pars defects.   IMPRESSION: No findings to explain the patient's pain.   CT chest 2 months ago  Musculoskeletal: Degenerative changes in the spine. Slight compression of the T7 vertebral body, unchanged. No worrisome lytic or sclerotic lesions.     COGNITION: Overall cognitive status: Within functional limits for tasks assessed                                  POSTURE: forward head and flat thoracic spine                   Cervical  AROM:   03/11/23     Flexion  WFL     Extension  80% limited*     R ROT  WFL     L ROT  25% limited *(staccato movement)     R SB  0% limited    L SB 25% limited (tightness)                           * Pain              (  Blank rows = not tested)                  UE Measurements       Upper Extremity Right 03/11/23 Left 03/11/23    A/PROM MMT A/PROM MMT  Shoulder Flexion 150*  3+  150*  3+  Shoulder Extension   WFL  4+ WFL   4-**  Shoulder Abduction          Shoulder Adduction          Shoulder Internal Rotation Reaches to T6 SP   5  Reaches to T6 SP** 4*  Shoulder External Rotation Reaches to T2 SP   4- Reaches to T2 SP    3+*  Elbow Flexion          Elbow Extension          Wrist Flexion          Wrist Extension          Wrist Supination          Wrist Pronation          Wrist Ulnar Deviation          Wrist Radial Deviation          Grip Strength NA   NA                          (Blank rows = not tested)                       * pain in lower back (and in shoulders) - hinges at lower T spine with shoulder flexion    ** increased neuropathy        TODAY'S TREATMENT:                                                                                                                              DATE:   04/07/2023  Therapeutic Exercise:  Aerobic: Supine: Quad:   Seated:  Standing:  low back traction hanging on door x10 5" holds, L stretch at counter x10 10" holds, posterior rib expansion with ball on chest 6 minutes Neuromuscular Re-education: posterior lateral rib expansion - tactile and verbal cues - use of TB/belt/reaching to activate appropriate muscles - tolerated belt best  15 minutes  Therapeutic Activity: Self Care: Trigger Point Dry Needling:  Modalities:     PATIENT EDUCATION:  Education details: on HEP and anatomy, on rationale behind each exercises, on how to perform vacuum properly Person educated: Patient Education method: Programmer, multimedia, Demonstration, and Handouts Education comprehension: verbalized understanding       HOME EXERCISE PROGRAM: 3/19 box breathing/pain relief strategies [email or web address]  - previous with long exhale breathing     ASSESSMENT:   CLINICAL IMPRESSION: 04/07/2023 Session focused on posterior lateral rib expansion which is still challenging for patient. Tolerated tactile cues well and use of gait belt best.  Improved muscle activation noted after  practice and use of gait belt. Discussed rest days as patient likes to perform challenging exercises back to back. Overall patient is doing very well and would continue to benefit from skilled PT at this time.   Eval: Patient presents to physical therapy today with severe neck and left upper arm pain that started about 1.5 weeks ago.  Assessment limited secondary to high levels of pain on this date.  Greatest relief felt with cervical traction which patient was educated on as well as other pain management strategies.  Discussed current pain response and importance of addressing pain  to down regulate central nervous system.  Patient severely limited in range of motion, function, sleep and overall quality of life secondary to pain and physical limitations at this time and would greatly benefit from skilled physical therapy.   OBJECTIVE IMPAIRMENTS: decreased activity tolerance, decreased ROM, decreased strength, improper body mechanics, postural dysfunction, and pain.    ACTIVITY LIMITATIONS: lifting, bending, sitting, sleeping, dressing, reach over head, and locomotion level   PARTICIPATION LIMITATIONS: community activity and occupation, playing guitar   PERSONAL FACTORS: 1-2 comorbidities: osteoporosis, vasculitis,  are also affecting patient's functional outcome.    REHAB POTENTIAL: Good   CLINICAL DECISION MAKING: Evolving/moderate complexity   EVALUATION COMPLEXITY: Moderate     GOALS: Goals reviewed with patient? yes   SHORT TERM GOALS: Target date: 02/04/2023  Patient will be independent in self management strategies to improve quality of life and functional outcomes. Baseline: New Program Goal status: MET   2.  Patient will report at least 50% improvement in overall symptoms and/or function to demonstrate improved functional mobility Baseline: 0% better Goal status: MET   3.  Patient will be able to find comfortable position at night to improve ability to get comfortable and fall  asleep Baseline: Difficult finding comfortable position Goal status:MET   4.  Patient will demonstrate pain-free cervical range of motion in all directions Baseline: Painful Goal status: PROGRESSING        LONG TERM GOALS: Target date: 06/03/2023    Patient will report at least 75% improvement in overall symptoms and/or function to demonstrate improved functional mobility Baseline: 0% better Goal status: PROGRESSING   2.  Patient will be able to play guitar without pain or difficulties to return to prior level of function Baseline: Painful/difficult Goal status: PROGRESSING- working up hours of play and taking medicine   3.  Patient report no radicular symptoms in left upper extremity to improve quality of life. Baseline: Current radicular symptoms into left upper extremity Goal status: PROGRESSING       4. Patient will report at least 90% improvement in overall symptoms and/or function to demonstrate improved functional mobility   Current: 80% better Goal status: NEW  5. 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    Current: unable Goal status: NEW  PLAN:   PT FREQUENCY: 1-2x/week for a total of 16 visits over 12 week certification period   PT DURATION: 12 weeks   PLANNED INTERVENTIONS: Therapeutic exercises, Therapeutic activity, Neuromuscular re-education, Balance training, Gait training, Patient/Family education, Self Care, Joint mobilization, Joint manipulation, Stair training, Canalith repositioning, Orthotic/Fit training, DME instructions, Aquatic Therapy, Dry Needling, Electrical stimulation, Spinal manipulation, Spinal mobilization, Cryotherapy, Moist heat, Traction, Ionotophoresis 4mg /ml Dexamethasone, Manual therapy, and Re-evaluation.   PLAN FOR NEXT SESSION:  ROM cervical, MMT in UE, isolate  motions, lumbar mobility/core, return to dance and playing drums and yoga routine      4:27 PM, 04/07/23 Tereasa Coop, DPT Physical Therapy with [facility]

## 2023-04-14 ENCOUNTER — Other Ambulatory Visit: Payer: Self-pay

## 2023-04-15 ENCOUNTER — Ambulatory Visit (INDEPENDENT_AMBULATORY_CARE_PROVIDER_SITE_OTHER): Payer: Commercial Managed Care - PPO | Admitting: Physical Therapy

## 2023-04-15 ENCOUNTER — Encounter: Payer: Self-pay | Admitting: Physical Therapy

## 2023-04-15 DIAGNOSIS — M5459 Other low back pain: Secondary | ICD-10-CM

## 2023-04-15 DIAGNOSIS — M6281 Muscle weakness (generalized): Secondary | ICD-10-CM | POA: Diagnosis not present

## 2023-04-15 DIAGNOSIS — M542 Cervicalgia: Secondary | ICD-10-CM | POA: Diagnosis not present

## 2023-04-15 NOTE — Therapy (Signed)
OUTPATIENT PHYSICAL THERAPY TREATMENT NOTE     Patient Name: Jesse COTRONEO, [name] MRN: [id number] DOB:28-Mar-1981, 42 y.o., male Today's Date: 04/15/2023  PCP: Agapito Games, MD   REFERRING PROVIDER: Monica Becton, MD  END OF SESSION:   PT End of Session - 04/15/23 1429     Visit Number 16    Number of Visits 28    Date for PT Re-Evaluation 06/03/23    PT Start Time 1431    PT Stop Time 1510    PT Time Calculation (min) 39 min             Past Medical History:  Diagnosis Date   Allergy    Arthritis    Asthma    Chronic prostatitis    Myalgia    Pulmonary infiltrates    Sinusitis, chronic    Tonsillar abscess    Vasculitis (HCC)    Past Surgical History:  Procedure Laterality Date   BRONCHOSCOPY  2011   TBBX ----inflammation   IR FLUORO GUIDE CV LINE RIGHT  01/31/2017   IR FLUORO GUIDE CV MIDLINE PICC RIGHT  01/30/2017   IR US GUIDE VASC ACCESS RIGHT  01/30/2017   peritonsilar abscess     TYMPANOPLASTY     VIDEO BRONCHOSCOPY Bilateral 06/25/2018   Procedure: VIDEO BRONCHOSCOPY WITHOUT FLUORO;  Surgeon: Coralyn Helling, MD;  Location: [facility];  Service: Cardiopulmonary;  Laterality: Bilateral;   Patient Active Problem List   Diagnosis Date Noted   Testicular swelling, right 01/08/2023   Scrotal pain 01/08/2023   Radiculitis of left cervical region 12/30/2022   Left lumbar radiculitis 12/30/2022   Osteoporosis 12/30/2022   Long term current use of systemic steroids 05/17/2021   Closed fracture of nasal bones 06/19/2020   Coccygeal pain 06/16/2020   Nose injury, initial encounter 06/16/2020   Atherosclerosis of aorta (HCC) 04/12/2019   Pulmonary infiltrates    Medication monitoring encounter 03/03/2018   Pulmonary nodule seen on imaging study 01/15/2018   Microscopic polyangiitis (HCC) 03/14/2016   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   Dyshidrotic eczema 04/08/2014   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   MYALGIA  06/28/2010   GERD 06/27/2010   Nonspecific (abnormal) findings on radiological and other examination of body structure 05/29/2010   CT, CHEST, ABNORMAL 05/29/2010   Asthma with bronchitis 04/09/2010   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     THERAPY DIAG:  Cervicalgia  Other low back pain  Muscle weakness (generalized)     REFERRING DIAG: M54.16 (ICD-10-CM) - Left lumbar radiculitis   Rationale for Evaluation and Treatment: Rehabilitation     ONSET DATE: 1.5 weeks ago   SUBJECTIVE:  SUBJECTIVE STATEMENT: 04/15/2023 States he has had some 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 States he has had some sacral pain that happen when he sits "comfortably"  Eval: Patient reports intense neck pain that started 1 and half weeks ago.  Patient reports he was fine on Saturday where he went rollerskating and had no falls or issues but the next day his neck/shoulder/arm started to hurt.  Reports he played the guitar which increased his pain on this left side.  Reports he was given prednisone which did not change his symptoms and was also given gabapentin but has not started that yet.  Patient has a difficult time getting comfortable and pain is just continuous in nature.  He gets slight relief with immobilization but no relief with anything else he has tried at this time.   Pain radiates into left upper arm and at times into forearm.  Prior to this episode patient was having occasional symptoms but they would resolve after a short while.  During 1 of these episode he did have some hand tingling but has not had it since.   Patient also has a current T7 compression fracture which was found during a CT scan of his chest, reports he does have pain in his lower thoracic (points to around T12) but his  neck is his biggest issue at this time. Patient is right-handed but is ambidextrous and some activities.   PERTINENT HISTORY:  Osteoporosis, Wegener's granulomatosis (long term steroid use), psoriatic arthritis, T7 compression fracture   PAIN:  Are you having pain? Yes: NPRS scale: 0/10    PRECAUTIONS: None   WEIGHT BEARING RESTRICTIONS: No   FALLS:  Has patient fallen in last 6 months? No     OCCUPATION: nuclear physicist   PLOF: Independent   PATIENT GOALS: to have less pain and be able to play guitar       OBJECTIVE:    DIAGNOSTIC FINDINGS: 12/30/22 Xray cervical spine  FINDINGS: There is no evidence of cervical spine fracture or prevertebral soft tissue swelling. Alignment is normal. Mild endplate osteophyte formation is present at C5-C6. No significant neural foraminal stenosis bilaterally. The dens is intact and lateral masses are symmetric.   IMPRESSION: Mild degenerative changes at C5-C6.   Xray Lumbar spine FINDINGS: Alignment is anatomic. Vertebral body and disc space heights are normal. No degenerative changes. No definite pars defects.   IMPRESSION: No findings to explain the patient's pain.   CT chest 2 months ago  Musculoskeletal: Degenerative changes in the spine. Slight compression of the T7 vertebral body, unchanged. No worrisome lytic or sclerotic lesions.     COGNITION: Overall cognitive status: Within functional limits for tasks assessed                                  POSTURE: forward head and flat thoracic spine                   Cervical  AROM:   03/11/23     Flexion  WFL     Extension  80% limited*     R ROT  WFL     L ROT  25% limited *(staccato movement)     R SB  0% limited    L SB 25% limited (tightness)                           *  Pain              (Blank rows = not tested)                  UE Measurements       Upper Extremity Right 03/11/23 Left 03/11/23    A/PROM MMT A/PROM MMT  Shoulder Flexion 150*   3+  150*  3+  Shoulder Extension  WFL  4+ WFL   4-**  Shoulder Abduction          Shoulder Adduction          Shoulder Internal Rotation Reaches to T6 SP   5  Reaches to T6 SP** 4*  Shoulder External Rotation Reaches to T2 SP   4- Reaches to T2 SP    3+*  Elbow Flexion          Elbow Extension          Wrist Flexion          Wrist Extension          Wrist Supination          Wrist Pronation          Wrist Ulnar Deviation          Wrist Radial Deviation          Grip Strength NA   NA                          (Blank rows = not tested)                       * pain in lower back (and in shoulders) - hinges at lower T spine with shoulder flexion    ** increased neuropathy        TODAY'S TREATMENT:                                                                                                                              DATE:   04/15/2023  Therapeutic Exercise:  Aerobic: Supine: bridges x25 5" holds., thomas stretch x3 30" holds B Prone: quad stretch x4 30" holds B, tall kneel stretch 20" holds  Seated: piriformis IR/ER x3 30" holds, lateral costal breathing in seated 10 minutes with band for tactile cues.   Standing:   Neuromuscular Re-education:  Therapeutic Activity: Self Care: Trigger Point Dry Needling:  Modalities:     PATIENT EDUCATION:  Education details: on HEP, on importance of LE exercises, building a program and neural stretches and muscles stretches.  Person educated: Patient Education method: Explanation, Demonstration, and Handouts Education comprehension: verbalized understanding       HOME EXERCISE PROGRAM: 3/19 box breathing/pain relief strategies [id number]  - previous with long exhale breathing     ASSESSMENT:   CLINICAL IMPRESSION: 04/15/2023 Session focused on lower extremity secondary to recent increase in lower extremity symptoms and nerve pain.  Reviewed previous home exercise program from PT in regards to low back and hip exercises.  Focused  on hip extension which is significantly limited bilaterally with left greater than right.  Added tall kneeling stretch as well as prone exercises to home exercise program.  No increased symptoms noted end of session.  Will continue with current plan of care as tolerated.  Eval: Patient presents to physical therapy today with severe neck and left upper arm pain that started about 1.5 weeks ago.  Assessment limited secondary to high levels of pain on this date.  Greatest relief felt with cervical traction which patient was educated on as well as other pain management strategies.  Discussed current pain response and importance of addressing pain  to down regulate central nervous system.  Patient severely limited in range of motion, function, sleep and overall quality of life secondary to pain and physical limitations at this time and would greatly benefit from skilled physical therapy.   OBJECTIVE IMPAIRMENTS: decreased activity tolerance, decreased ROM, decreased strength, improper body mechanics, postural dysfunction, and pain.    ACTIVITY LIMITATIONS: lifting, bending, sitting, sleeping, dressing, reach over head, and locomotion level   PARTICIPATION LIMITATIONS: community activity and occupation, playing guitar   PERSONAL FACTORS: 1-2 comorbidities: osteoporosis, vasculitis,  are also affecting patient's functional outcome.    REHAB POTENTIAL: Good   CLINICAL DECISION MAKING: Evolving/moderate complexity   EVALUATION COMPLEXITY: Moderate     GOALS: Goals reviewed with patient? yes   SHORT TERM GOALS: Target date: 02/04/2023  Patient will be independent in self management strategies to improve quality of life and functional outcomes. Baseline: New Program Goal status: MET   2.  Patient will report at least 50% improvement in overall symptoms and/or function to demonstrate improved functional mobility Baseline: 0% better Goal status: MET   3.  Patient will be able to find comfortable  position at night to improve ability to get comfortable and fall asleep Baseline: Difficult finding comfortable position Goal status:MET   4.  Patient will demonstrate pain-free cervical range of motion in all directions Baseline: Painful Goal status: PROGRESSING        LONG TERM GOALS: Target date: 06/03/2023    Patient will report at least 75% improvement in overall symptoms and/or function to demonstrate improved functional mobility Baseline: 0% better Goal status: PROGRESSING   2.  Patient will be able to play guitar without pain or difficulties to return to prior level of function Baseline: Painful/difficult Goal status: PROGRESSING- working up hours of play and taking medicine   3.  Patient report no radicular symptoms in left upper extremity to improve quality of life. Baseline: Current radicular symptoms into left upper extremity Goal status: PROGRESSING       4. Patient will report at least 90% improvement in overall symptoms and/or function to demonstrate improved functional mobility   Current: 80% better Goal status: NEW  5. 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    Current: unable Goal status: NEW  PLAN:   PT FREQUENCY: 1-2x/week for a total of 16 visits over 12 week certification period   PT DURATION: 12 weeks   PLANNED INTERVENTIONS: Therapeutic exercises, Therapeutic activity, Neuromuscular re-education, Balance training, Gait training, Patient/Family education, Self Care, Joint mobilization, Joint manipulation, Stair training, Canalith repositioning, Orthotic/Fit training, DME instructions, Aquatic Therapy, Dry Needling, Electrical stimulation, Spinal manipulation, Spinal mobilization, Cryotherapy, Moist heat, Traction,  Ionotophoresis 4mg /ml Dexamethasone, Manual therapy, and Re-evaluation.   PLAN FOR NEXT SESSION:  ROM cervical, MMT in UE, isolate motions, lumbar  mobility/core, return to dance and playing drums and yoga routine     3:19 PM, 04/15/23 Tereasa Coop, DPT Physical Therapy with [facility]

## 2023-04-22 ENCOUNTER — Ambulatory Visit (INDEPENDENT_AMBULATORY_CARE_PROVIDER_SITE_OTHER): Payer: Commercial Managed Care - PPO | Admitting: Physical Therapy

## 2023-04-22 ENCOUNTER — Encounter: Payer: Self-pay | Admitting: Physical Therapy

## 2023-04-22 DIAGNOSIS — M6281 Muscle weakness (generalized): Secondary | ICD-10-CM

## 2023-04-22 DIAGNOSIS — M5459 Other low back pain: Secondary | ICD-10-CM

## 2023-04-22 DIAGNOSIS — M542 Cervicalgia: Secondary | ICD-10-CM

## 2023-04-22 NOTE — Therapy (Signed)
OUTPATIENT PHYSICAL THERAPY TREATMENT NOTE  Progress Note Reporting Period 03/11/23 to 04/22/23  See note below for Objective Data and Assessment of Progress/Goals.       Patient Name: Jesse ASLINGER, [name] MRN: [id number] DOB:05/23/1981, 42 y.o., male Today's Date: 04/22/2023  PCP: Agapito Games, MD   REFERRING PROVIDER: Monica Becton, MD  END OF SESSION:   PT End of Session - 04/22/23 1554     Visit Number 17    Number of Visits 28    Date for PT Re-Evaluation 06/03/23    PT Start Time 1556    PT Stop Time 1645    PT Time Calculation (min) 49 min    Activity Tolerance Patient tolerated treatment well    Behavior During Therapy [facility] for tasks assessed/performed             Past Medical History:  Diagnosis Date   Allergy    Arthritis    Asthma    Chronic prostatitis    Myalgia    Pulmonary infiltrates    Sinusitis, chronic    Tonsillar abscess    Vasculitis (HCC)    Past Surgical History:  Procedure Laterality Date   BRONCHOSCOPY  2011   TBBX ----inflammation   IR FLUORO GUIDE CV LINE RIGHT  01/31/2017   IR FLUORO GUIDE CV MIDLINE PICC RIGHT  01/30/2017   IR US GUIDE VASC ACCESS RIGHT  01/30/2017   peritonsilar abscess     TYMPANOPLASTY     VIDEO BRONCHOSCOPY Bilateral 06/25/2018   Procedure: VIDEO BRONCHOSCOPY WITHOUT FLUORO;  Surgeon: Coralyn Helling, MD;  Location: [facility];  Service: Cardiopulmonary;  Laterality: Bilateral;   Patient Active Problem List   Diagnosis Date Noted   Testicular swelling, right 01/08/2023   Scrotal pain 01/08/2023   Radiculitis of left cervical region 12/30/2022   Left lumbar radiculitis 12/30/2022   Osteoporosis 12/30/2022   Long term current use of systemic steroids 05/17/2021   Closed fracture of nasal bones 06/19/2020   Coccygeal pain 06/16/2020   Nose injury, initial encounter 06/16/2020   Atherosclerosis of aorta (HCC) 04/12/2019   Pulmonary infiltrates    Medication monitoring encounter 03/03/2018    Pulmonary nodule seen on imaging study 01/15/2018   Microscopic polyangiitis (HCC) 03/14/2016   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   Dyshidrotic eczema 04/08/2014   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 States his nerve pain is better. States his sacrum hurts when he sits in a soft chair. States he feels better 85% better.  Eval: Patient reports intense neck pain that started 1 and half weeks ago.  Patient reports he was fine on Saturday where he went rollerskating and had no falls or issues but the next day his neck/shoulder/arm started to hurt.  Reports he played the guitar which increased his pain on this left side.  Reports he was given prednisone which did not change his symptoms and was also given gabapentin but has not started that yet.  Patient has a difficult time getting comfortable and pain is just continuous in nature.  He gets slight relief with immobilization but no relief with anything else he has tried at this time.   Pain radiates into left upper arm and at times into forearm.  Prior to this episode patient was having occasional symptoms but they would resolve after a short while.  During 1 of these  episode he did have some hand tingling but has not had it since.   Patient also has a current T7 compression fracture which was found during a CT scan of his chest, reports he does have pain in his lower thoracic (points to around T12) but his neck is his biggest issue at this time. Patient is right-handed but is ambidextrous and some activities.   PERTINENT HISTORY:  Osteoporosis, Wegener's granulomatosis (long term steroid use), psoriatic arthritis, T7 compression fracture   PAIN:  Are you having pain? Yes: NPRS scale: 0/10    PRECAUTIONS: None   WEIGHT BEARING RESTRICTIONS: No   FALLS:  Has patient fallen in last 6 months? No     OCCUPATION: nuclear physicist   PLOF: Independent   PATIENT GOALS: to have less pain and be able to play guitar       OBJECTIVE:    DIAGNOSTIC FINDINGS: 12/30/22 Xray cervical spine  FINDINGS: There is no evidence of cervical spine fracture or prevertebral soft tissue swelling. Alignment is normal. Mild endplate osteophyte formation is present at C5-C6. No significant neural foraminal stenosis bilaterally. The dens is intact and lateral masses are symmetric.   IMPRESSION: Mild degenerative changes at C5-C6.   Xray Lumbar spine FINDINGS: Alignment is anatomic. Vertebral body and disc space heights are normal. No degenerative changes. No definite pars defects.   IMPRESSION: No findings to explain the patient's pain.   CT chest 2 months ago  Musculoskeletal: Degenerative changes in the spine. Slight compression of the T7 vertebral body, unchanged. No worrisome lytic or sclerotic lesions.     COGNITION: Overall cognitive status: Within functional limits for tasks assessed                                  POSTURE: forward head and flat thoracic spine                   Cervical  AROM:   04/22/23     Flexion  WFL     Extension WFL     R ROT  WFL     L ROT  WFL     R SB WFL    L SB WFL                  (Blank rows = not  tested)  UE Measurements       Upper Extremity Right 04/22/23 Left 04/22/23    A/PROM MMT A/PROM MMT  Shoulder Flexion 160 4 160 4  Shoulder Extension       Shoulder Abduction  WFL  4+ WFL  4+   Shoulder Adduction          Shoulder Internal Rotation Reaches to T6 SP   5  Reaches to T6 SP 4+  Shoulder External Rotation Reaches to T2 SP   4+ Reaches to T2 SP    4+  Elbow Flexion          Elbow Extension          Wrist Flexion          Wrist Extension          Wrist Supination          Wrist Pronation          Wrist Ulnar Deviation          Wrist Radial Deviation          Grip Strength NA   NA                          (Blank rows = not tested)                       * pain in lower back (and in shoulders) - hinges at lower T spine with shoulder flexion    ** increased neuropathy        TODAY'S TREATMENT:                                                                                                                              DATE:   04/22/2023  Therapeutic Exercise: Obj measures updated- reviewed entire HEP -discussed transition back to activities - chopping wood/drumming/dancing/skating and turning activities into exercises prior to performing activity   Standing:   Neuromuscular Re-education:  Therapeutic Activity: Self Care: Trigger Point Dry Needling:  Modalities:     PATIENT EDUCATION:  Education details: on progress made and progress to be made, performing new activities and shorter durations and checking in with symptoms remembering to perform exercises before and after potentially aggravating activities as well as when pain results. Person educated: Patient Education method: Explanation, Demonstration, and Handouts Education comprehension: verbalized understanding       HOME EXERCISE PROGRAM: 3/19 box breathing/pain relief strategies [id number]  - previous with long exhale breathing     ASSESSMENT:   CLINICAL IMPRESSION: 04/22/2023 Progress  note performed On this date. Overall patient is doing very well and has met all short term goals and all but one long term goal at this time. Reviewed entire home exercise program and answered all questions as well as progress made and progress to be made. Session focused on discussion of activities and gradual return to different activities. Discussed turning  specific  activities, like chopping wood, into exercises to determine tolerance to those activities prior to performing them for long periods of time period. Reducing frequency to one time every four weeks and will focus on progression of home exercise program and transition to prior level of activity.  Eval: Patient presents to physical therapy today with severe neck and left upper arm pain that started about 1.5 weeks ago.  Assessment limited secondary to high levels of pain on this date.  Greatest relief felt with cervical traction which patient was educated on as well as other pain management strategies.  Discussed current pain response and importance of addressing pain  to down regulate central nervous system.  Patient severely limited in range of motion, function, sleep and overall quality of life secondary to pain and physical limitations at this time and would greatly benefit from skilled physical therapy.   OBJECTIVE IMPAIRMENTS: decreased activity tolerance, decreased ROM, decreased strength, improper body mechanics, postural dysfunction, and pain.    ACTIVITY LIMITATIONS: lifting, bending, sitting, sleeping, dressing, reach over head, and locomotion level   PARTICIPATION LIMITATIONS: community activity and occupation, playing guitar   PERSONAL FACTORS: 1-2 comorbidities: osteoporosis, vasculitis,  are also affecting patient's functional outcome.    REHAB POTENTIAL: Good   CLINICAL DECISION MAKING: Evolving/moderate complexity   EVALUATION COMPLEXITY: Moderate     GOALS: Goals reviewed with patient? yes   SHORT TERM GOALS:  Target date: 02/04/2023  Patient will be independent in self management strategies to improve quality of life and functional outcomes. Baseline: New Program Goal status: MET   2.  Patient will report at least 50% improvement in overall symptoms and/or function to demonstrate improved functional mobility Baseline: 0% better Goal status: MET   3.  Patient will be able to find comfortable position at night to improve ability to get comfortable and fall asleep Baseline: Difficult finding comfortable position Goal status:MET   4.  Patient will demonstrate pain-free cervical range of motion in all directions Baseline: Painful Goal status: MET        LONG TERM GOALS: Target date: 06/03/2023    Patient will report at least 75% improvement in overall symptoms and/or function to demonstrate improved functional mobility Baseline: 0% better Goal status: MET   2.  Patient will be able to play guitar without pain or difficulties to return to prior level of function Baseline: Painful/difficult Goal status:  MET   3.  Patient report no radicular symptoms in left upper extremity to improve quality of life. Baseline: Current radicular symptoms into left upper extremity Goal status: MET       4. Patient will report at least 90% improvement in overall symptoms and/or function to demonstrate improved functional mobility   Current: 80% better Goal status: PROGRESSING  5. 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    Current: unable Goal status: MET  PLAN:   PT FREQUENCY: 1-2x/week for a total of 16 visits over 12 week certification period   PT DURATION: 12 weeks   PLANNED INTERVENTIONS: Therapeutic exercises, Therapeutic activity, Neuromuscular re-education, Balance training, Gait training, Patient/Family education, Self Care, Joint mobilization, Joint manipulation, Stair training, Canalith  repositioning, Orthotic/Fit training, DME instructions, Aquatic Therapy, Dry Needling, Electrical stimulation, Spinal manipulation, Spinal mobilization, Cryotherapy, Moist heat, Traction, Ionotophoresis 4mg /ml Dexamethasone, Manual therapy, and Re-evaluation.   PLAN FOR NEXT SESSION:  MMT HEP progression - return to PLOF  4:56 PM, 04/22/23 Tereasa Coop, DPT Physical Therapy with [facility]

## 2023-05-12 ENCOUNTER — Other Ambulatory Visit (HOSPITAL_COMMUNITY): Payer: Self-pay

## 2023-05-20 ENCOUNTER — Ambulatory Visit (INDEPENDENT_AMBULATORY_CARE_PROVIDER_SITE_OTHER): Payer: Commercial Managed Care - PPO | Admitting: Physical Therapy

## 2023-05-20 ENCOUNTER — Encounter: Payer: Self-pay | Admitting: Physical Therapy

## 2023-05-20 DIAGNOSIS — M542 Cervicalgia: Secondary | ICD-10-CM

## 2023-05-20 DIAGNOSIS — M6281 Muscle weakness (generalized): Secondary | ICD-10-CM | POA: Diagnosis not present

## 2023-05-20 DIAGNOSIS — M5459 Other low back pain: Secondary | ICD-10-CM | POA: Diagnosis not present

## 2023-05-20 NOTE — Therapy (Unsigned)
OUTPATIENT PHYSICAL THERAPY TREATMENT NOTE  Progress Note Reporting Period 03/11/23 to 04/22/23  See note below for Objective Data and Assessment of Progress/Goals.       Patient Name: Jesse SCHRADER, [name] MRN: [id number] DOB:May 18, 1981, 42 y.o., male Today's Date: 05/20/2023  PCP: Agapito Games, MD   REFERRING PROVIDER: Monica Becton, MD  END OF SESSION:   PT End of Session - 05/20/23 1604     Visit Number 18    Number of Visits 28    Date for PT Re-Evaluation 06/03/23    PT Start Time 1604    PT Stop Time 1644    PT Time Calculation (min) 40 min    Activity Tolerance Patient tolerated treatment well    Behavior During Therapy [facility] for tasks assessed/performed             Past Medical History:  Diagnosis Date   Allergy    Arthritis    Asthma    Chronic prostatitis    Myalgia    Pulmonary infiltrates    Sinusitis, chronic    Tonsillar abscess    Vasculitis (HCC)    Past Surgical History:  Procedure Laterality Date   BRONCHOSCOPY  2011   TBBX ----inflammation   IR FLUORO GUIDE CV LINE RIGHT  01/31/2017   IR FLUORO GUIDE CV MIDLINE PICC RIGHT  01/30/2017   IR US GUIDE VASC ACCESS RIGHT  01/30/2017   peritonsilar abscess     TYMPANOPLASTY     VIDEO BRONCHOSCOPY Bilateral 06/25/2018   Procedure: VIDEO BRONCHOSCOPY WITHOUT FLUORO;  Surgeon: Coralyn Helling, MD;  Location: [facility];  Service: Cardiopulmonary;  Laterality: Bilateral;   Patient Active Problem List   Diagnosis Date Noted   Testicular swelling, right 01/08/2023   Scrotal pain 01/08/2023   Radiculitis of left cervical region 12/30/2022   Left lumbar radiculitis 12/30/2022   Osteoporosis 12/30/2022   Long term current use of systemic steroids 05/17/2021   Closed fracture of nasal bones 06/19/2020   Coccygeal pain 06/16/2020   Nose injury, initial encounter 06/16/2020   Atherosclerosis of aorta (HCC) 04/12/2019   Pulmonary infiltrates    Medication monitoring encounter 03/03/2018    Pulmonary nodule seen on imaging study 01/15/2018   Microscopic polyangiitis (HCC) 03/14/2016   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   Dyshidrotic eczema 04/08/2014   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 Has occasional pains in back/neck/leg but he stretches and it feels better. Has been really good about doing his exercises. States he tried to get off his nerve medication and   Eval: Patient reports intense neck pain that started 1 and half weeks ago.  Patient reports he was fine on Saturday where he went rollerskating and had no falls or issues but the next day his neck/shoulder/arm started to hurt.  Reports he played the guitar which increased his pain on this left side.  Reports he was given prednisone which did not change his symptoms and was also given gabapentin but has not started that yet.  Patient has a difficult time getting comfortable and pain is just continuous in nature.  He gets slight relief with immobilization but no relief with anything else he has tried at this time.   Pain radiates into left upper arm and at times into forearm.  Prior to this episode patient was having occasional symptoms  but they would resolve after a short while.  During 1 of these episode he did have some hand tingling but has not had it since.   Patient also has a current T7 compression fracture which was found during a CT scan of his chest, reports he does have pain in his lower thoracic (points to around T12) but his neck is his biggest issue at this time. Patient is right-handed but is ambidextrous and some activities.   PERTINENT HISTORY:  Osteoporosis, Wegener's granulomatosis (long term steroid use), psoriatic arthritis, T7 compression fracture   PAIN:  Are you having pain? Yes: NPRS scale: 0/10    PRECAUTIONS: None   WEIGHT BEARING RESTRICTIONS: No   FALLS:  Has patient fallen in last 6 months? No     OCCUPATION: nuclear physicist   PLOF: Independent   PATIENT GOALS: to have less pain and be able to play guitar       OBJECTIVE:    DIAGNOSTIC FINDINGS: 12/30/22 Xray cervical spine  FINDINGS: There is no evidence of cervical spine fracture or prevertebral soft tissue swelling. Alignment is normal. Mild endplate osteophyte formation is present at C5-C6. No significant neural foraminal stenosis bilaterally. The dens is intact and lateral masses are symmetric.   IMPRESSION: Mild degenerative changes at C5-C6.   Xray Lumbar spine FINDINGS: Alignment is anatomic. Vertebral body and disc space heights are normal. No degenerative changes. No definite pars defects.   IMPRESSION: No findings to explain the patient's pain.   CT chest 2 months ago  Musculoskeletal: Degenerative changes in the spine. Slight compression of the T7 vertebral body, unchanged. No worrisome lytic or sclerotic lesions.     COGNITION: Overall cognitive status: Within functional limits for tasks assessed                                  POSTURE: forward head and flat thoracic spine                   Cervical  AROM:   04/22/23     Flexion  WFL     Extension WFL     R ROT  WFL     L ROT  WFL      R SB WFL    L SB WFL                  (Blank rows =  not tested)                  UE Measurements       Upper Extremity Right 04/22/23 Left 04/22/23    A/PROM MMT A/PROM MMT  Shoulder Flexion 160 4 160 4  Shoulder Extension       Shoulder Abduction  WFL  4+ WFL  4+   Shoulder Adduction          Shoulder Internal Rotation Reaches to T6 SP   5  Reaches to T6 SP 4+  Shoulder External Rotation Reaches to T2 SP   4+ Reaches to T2 SP    4+  Elbow Flexion          Elbow Extension          Wrist Flexion          Wrist Extension          Wrist Supination          Wrist Pronation          Wrist Ulnar Deviation          Wrist Radial Deviation          Grip Strength NA   NA                          (Blank rows = not tested)                       * pain in lower back (and in shoulders) - hinges at lower T spine with shoulder flexion    ** increased neuropathy        TODAY'S TREATMENT:                                                                                                                              DATE:   05/20/2023  Therapeutic Exercise: Obj measures updated- reviewed entire HEP -discussed transition back to activities - chopping wood/drumming/dancing/skating and turning activities into exercises prior to performing activity   Standing:   Neuromuscular Re-education:  Therapeutic Activity: Self Care: Trigger Point Dry Needling:  Modalities:  Tall kneeling, IT band stretch not effective, tall kneeling bridge, tall kneeling pelvic tilts, Thomas stretch, prone hamstring curls, soft tissue mobilization with tiger tail to left lateral thigh, reduced tension but soreness noted on left lateral thigh   PATIENT EDUCATION:  Education details: on progress made and progress to be made, performing new activities and shorter durations and checking in with symptoms remembering to perform exercises before and after potentially aggravating activities as well as when pain results. Person  educated: Patient Education method: Explanation, Demonstration, and Handouts Education comprehension: verbalized understanding       HOME EXERCISE PROGRAM: 3/19 box breathing/pain relief strategies [id number]  - previous with long exhale breathing     ASSESSMENT:   CLINICAL IMPRESSION: 05/20/2023 ***  Eval: Patient presents to  physical therapy today with severe neck and left upper arm pain that started about 1.5 weeks ago.  Assessment limited secondary to high levels of pain on this date.  Greatest relief felt with cervical traction which patient was educated on as well as other pain management strategies.  Discussed current pain response and importance of addressing pain  to down regulate central nervous system.  Patient severely limited in range of motion, function, sleep and overall quality of life secondary to pain and physical limitations at this time and would greatly benefit from skilled physical therapy.   OBJECTIVE IMPAIRMENTS: decreased activity tolerance, decreased ROM, decreased strength, improper body mechanics, postural dysfunction, and pain.    ACTIVITY LIMITATIONS: lifting, bending, sitting, sleeping, dressing, reach over head, and locomotion level   PARTICIPATION LIMITATIONS: community activity and occupation, playing guitar   PERSONAL FACTORS: 1-2 comorbidities: osteoporosis, vasculitis,  are also affecting patient's functional outcome.    REHAB POTENTIAL: Good   CLINICAL DECISION MAKING: Evolving/moderate complexity   EVALUATION COMPLEXITY: Moderate     GOALS: Goals reviewed with patient? yes   SHORT TERM GOALS: Target date: 02/04/2023  Patient will be independent in self management strategies to improve quality of life and functional outcomes. Baseline: New Program Goal status: MET   2.  Patient will report at least 50% improvement in overall symptoms and/or function to demonstrate improved functional mobility Baseline: 0% better Goal status: MET   3.   Patient will be able to find comfortable position at night to improve ability to get comfortable and fall asleep Baseline: Difficult finding comfortable position Goal status:MET   4.  Patient will demonstrate pain-free cervical range of motion in all directions Baseline: Painful Goal status: MET        LONG TERM GOALS: Target date: 06/03/2023    Patient will report at least 75% improvement in overall symptoms and/or function to demonstrate improved functional mobility Baseline: 0% better Goal status: MET   2.  Patient will be able to play guitar without pain or difficulties to return to prior level of function Baseline: Painful/difficult Goal status:  MET   3.  Patient report no radicular symptoms in left upper extremity to improve quality of life. Baseline: Current radicular symptoms into left upper extremity Goal status: MET       4. Patient will report at least 90% improvement in overall symptoms and/or function to demonstrate improved functional mobility   Current: 80% better Goal status: PROGRESSING  5. 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    Current: unable Goal status: MET  PLAN:   PT FREQUENCY: 1-2x/week for a total of 16 visits over 12 week certification period   PT DURATION: 12 weeks   PLANNED INTERVENTIONS: Therapeutic exercises, Therapeutic activity, Neuromuscular re-education, Balance training, Gait training, Patient/Family education, Self Care, Joint mobilization, Joint manipulation, Stair training, Canalith repositioning, Orthotic/Fit training, DME instructions, Aquatic Therapy, Dry Needling, Electrical stimulation, Spinal manipulation, Spinal mobilization, Cryotherapy, Moist heat, Traction, Ionotophoresis 4mg /ml Dexamethasone, Manual therapy, and Re-evaluation.   PLAN FOR NEXT SESSION: Follow-up with neural stretches to upper extremity, progress breathing exercises      4:04 PM, 05/20/23 Tereasa Coop, DPT Physical Therapy with [facility]

## 2023-05-21 DIAGNOSIS — Z7952 Long term (current) use of systemic steroids: Secondary | ICD-10-CM | POA: Diagnosis not present

## 2023-05-21 DIAGNOSIS — M317 Microscopic polyangiitis: Secondary | ICD-10-CM | POA: Diagnosis not present

## 2023-05-21 DIAGNOSIS — L4059 Other psoriatic arthropathy: Secondary | ICD-10-CM | POA: Diagnosis not present

## 2023-05-21 DIAGNOSIS — Z6823 Body mass index (BMI) 23.0-23.9, adult: Secondary | ICD-10-CM | POA: Diagnosis not present

## 2023-05-21 DIAGNOSIS — B449 Aspergillosis, unspecified: Secondary | ICD-10-CM | POA: Diagnosis not present

## 2023-05-26 ENCOUNTER — Other Ambulatory Visit: Payer: Self-pay

## 2023-06-02 ENCOUNTER — Encounter: Payer: Commercial Managed Care - PPO | Admitting: Physical Therapy

## 2023-06-02 ENCOUNTER — Telehealth: Payer: Self-pay

## 2023-06-02 NOTE — Telephone Encounter (Signed)
error 

## 2023-06-04 ENCOUNTER — Other Ambulatory Visit (HOSPITAL_COMMUNITY): Payer: Self-pay

## 2023-06-04 ENCOUNTER — Encounter: Payer: Self-pay | Admitting: Family Medicine

## 2023-06-04 ENCOUNTER — Other Ambulatory Visit: Payer: Self-pay

## 2023-06-04 ENCOUNTER — Ambulatory Visit (INDEPENDENT_AMBULATORY_CARE_PROVIDER_SITE_OTHER): Payer: Commercial Managed Care - PPO | Admitting: Family Medicine

## 2023-06-04 VITALS — BP 123/61 | HR 78 | Ht 79.0 in | Wt 195.0 lb

## 2023-06-04 DIAGNOSIS — Z0000 Encounter for general adult medical examination without abnormal findings: Secondary | ICD-10-CM | POA: Diagnosis not present

## 2023-06-04 DIAGNOSIS — Z5181 Encounter for therapeutic drug level monitoring: Secondary | ICD-10-CM

## 2023-06-04 DIAGNOSIS — M8000XS Age-related osteoporosis with current pathological fracture, unspecified site, sequela: Secondary | ICD-10-CM | POA: Diagnosis not present

## 2023-06-04 MED ORDER — AZELASTINE HCL 0.15 % NA SOLN
1.0000 | Freq: Two times a day (BID) | NASAL | 3 refills | Status: DC
Start: 2023-06-04 — End: 2024-06-08
  Filled 2023-06-04: qty 30, 30d supply, fill #0
  Filled 2023-09-22: qty 30, 50d supply, fill #0

## 2023-06-04 NOTE — Progress Notes (Signed)
Complete physical exam  Patient: Jesse SPOLAR, [name]   DOB: Jul 10, 1981   42 y.o. Male  MRN: [id number]  Subjective:    Chief Complaint  Patient presents with   Annual Exam    Jesse Kyle, [name] is a 42 y.o. male who presents today for a complete physical exam. He reports consuming a general diet.  Has been doing more of a stretching routine for exercise to help with some of the pains and joint soreness.  Still going to physical therapy for his cervical spine and radicular symptoms and low back.Also taking pregabalin before bedtime  He generally feels fairly well.  He does have additional problems to discuss today.   Update: restarted Fosamax in May per endocrinology.  Prior use in 2015 and 2016.   Would like a refill on prior nasal spray prescribed.  Just uses as needed for intermittent nasal congestion seems to work pretty quickly and very well.  Has persistent right scrotal swelling.  Urology is following but not currently concerned.  Did mention that he notices penis at rest seems smaller and in fact the bit of residual foreskin is overlapping a little bit.  Noticing happening to be more diligent with hygiene especially after intercourse or it seems to trap bacteria.  No distinct rash or skin irritation.  Initially was using some alcohol wipes but then switched over to another type of antimicrobial cleanser.  Most recent fall risk assessment:    06/04/2023    9:06 AM  Fall Risk   Falls in the past year? 0  Number falls in past yr: 0  Injury with Fall? 0  Risk for fall due to : No Fall Risks  Follow up Falls evaluation completed     Most recent depression screenings:    06/04/2023    9:06 AM 10/24/2022    1:14 PM  PHQ 2/9 Scores  PHQ - 2 Score 0 0      Past Surgical History:  Procedure Laterality Date   BRONCHOSCOPY  2011   TBBX ----inflammation   IR FLUORO GUIDE CV LINE RIGHT  01/31/2017   IR FLUORO GUIDE CV MIDLINE PICC RIGHT  01/30/2017   IR US GUIDE VASC  ACCESS RIGHT  01/30/2017   peritonsilar abscess     TYMPANOPLASTY     VIDEO BRONCHOSCOPY Bilateral 06/25/2018   Procedure: VIDEO BRONCHOSCOPY WITHOUT FLUORO;  Surgeon: Coralyn Helling, MD;  Location: [facility];  Service: Cardiopulmonary;  Laterality: Bilateral;   Social History   Tobacco Use   Smoking status: Never   Smokeless tobacco: Never  Vaping Use   Vaping status: Never Used  Substance Use Topics   Alcohol use: Yes    Comment: occasional   Drug use: No      Patient Care Team: Agapito Games, MD as PCP - Kristine Linea, MD as Consulting Physician (Rheumatology) Waymon Budge, MD as Consulting Physician (Pulmonary Disease) Hermenia Bers, PT as Physical Therapist (Physical Therapy)   Outpatient Medications Prior to Visit  Medication Sig   albuterol (PROVENTIL) (2.5 MG/3ML) 0.083% nebulizer solution Inhale 1 vial via nebulizer every 6 hours as needed for wheezing.   albuterol (VENTOLIN HFA) 108 (90 Base) MCG/ACT inhaler Inhale 2 puffs into the lungs every 6 (six) hours as needed for wheezing or shortness of breath.   alendronate (FOSAMAX) 70 MG tablet Take 1 tablet (70 mg total) by mouth every 7 (seven) days. Take with a full glass of water on an empty stomach.  budesonide-formoterol (SYMBICORT) 160-4.5 MCG/ACT inhaler Inhale 2 puffs into the lungs 2 (two) times daily.   CALCIUM PO Take 1 tablet by mouth 2 (two) times daily.   cholecalciferol (VITAMIN D) 1000 UNITS tablet Take 1 tablet (1,000 Units total) by mouth daily. (Patient taking differently: Take 1,000 Units by mouth 2 (two) times daily.)   fluticasone (FLONASE) 50 MCG/ACT nasal spray INSTILL 2 SPRAYS INTO BOTH NOSTRILS DAILY (NEED APPT FOR FURTHER REFILLS)   Multiple Vitamin (MULTIVITAMIN WITH MINERALS) TABS tablet Take 1 tablet by mouth 2 (two) times daily.   mupirocin ointment (BACTROBAN) 2 % Use with nasal rinse bottle   naproxen (NAPROSYN) 500 MG tablet Take 1 tablet (500 mg total) by  mouth 2 (two) times daily with a meal.   Nebulizers (COMPRESSOR/NEBULIZER) MISC Use as direccted   omeprazole (PRILOSEC) 20 MG capsule Take 1 capsule (20 mg total) by mouth daily.   pravastatin (PRAVACHOL) 40 MG tablet Take 1 tablet by mouth daily.   sodium chloride HYPERTONIC 3 % nebulizer solution Take 3 mLs by nebulization 3 (three) times daily.   pregabalin (LYRICA) 50 MG capsule Take 1 capsule (50 mg total) by mouth every evening for 7 days, THEN 1 capsule (50 mg total) 2 (two) times daily for 7 days, THEN 1 capsule (50 mg total) 3 (three) times daily for 23 days.   [DISCONTINUED] ondansetron (ZOFRAN) 4 MG tablet Take 1 tablet (4 mg total) by mouth every 8 (eight) hours as needed for nausea or vomiting. (Patient not taking: Reported on 03/19/2023)   [DISCONTINUED] umeclidinium-vilanterol (ANORO ELLIPTA) 62.5-25 MCG/ACT AEPB Inhale 1 puff into the lungs daily.   Facility-Administered Medications Prior to Visit  Medication Dose Route Frequency Provider   diphenhydrAMINE (BENADRYL) injection 50 mg  50 mg Intramuscular Once PRN Causey, Larna Daughters, NP   EPINEPHrine (EPI-PEN) injection 0.3 mg  0.3 mg Intramuscular Once PRN Causey, Larna Daughters, NP   methylPREDNISolone sodium succinate (SOLU-MEDROL) 125 mg/2 mL injection 125 mg  125 mg Intramuscular Once PRN Causey, Larna Daughters, NP    ROS        Objective:     BP 123/61   Pulse 78   Ht 6\' 7"  (2.007 m)   Wt 195 lb (88.5 kg)   SpO2 98%   BMI 21.97 kg/m     Physical Exam Constitutional:      Appearance: He is well-developed.  HENT:     Head: Normocephalic and atraumatic.     Right Ear: Tympanic membrane, ear canal and external ear normal.     Left Ear: Tympanic membrane, ear canal and external ear normal.     Nose: Nose normal.     Mouth/Throat:     Pharynx: Oropharynx is clear.  Eyes:     Conjunctiva/sclera: Conjunctivae normal.     Pupils: Pupils are equal, round, and reactive to light.  Neck:     Thyroid:  No thyromegaly.  Cardiovascular:     Rate and Rhythm: Normal rate and regular rhythm.     Heart sounds: Normal heart sounds.  Pulmonary:     Effort: Pulmonary effort is normal.     Breath sounds: Normal breath sounds.  Abdominal:     General: Bowel sounds are normal. There is no distension.     Palpations: Abdomen is soft. There is no mass.     Tenderness: There is no abdominal tenderness. There is no guarding or rebound.  Musculoskeletal:        General: Normal range of motion.  Cervical back: Normal range of motion and neck supple. No tenderness.  Lymphadenopathy:     Cervical: No cervical adenopathy.  Skin:    General: Skin is warm and dry.  Neurological:     Mental Status: He is alert and oriented to person, place, and time.     Deep Tendon Reflexes: Reflexes are normal and symmetric.  Psychiatric:        Behavior: Behavior normal.        Thought Content: Thought content normal.        Judgment: Judgment normal.      No results found for any visits on 06/04/23.      Assessment & Plan:    Routine Health Maintenance and Physical Exam  Immunization History  Administered Date(s) Administered   DTaP 07/06/2007   Influenza Split 07/26/2015, 07/21/2017   Influenza Whole 09/04/2009, 07/22/2019   Influenza,inj,Quad PF,6+ Mos 07/21/2022   Influenza-Unspecified 07/05/2013, 07/22/2014, 07/21/2018, 07/18/2020, 08/13/2021   PFIZER(Purple Top)SARS-COV-2 Vaccination 10/18/2019, 11/08/2019, 07/08/2020, 11/22/2021   PNEUMOCOCCAL CONJUGATE-20 10/11/2021   Pneumococcal Polysaccharide-23 03/11/2018   Td 01/20/2008   Tdap 01/25/2017   Tetanus 07/06/2007    Health Maintenance  Topic Date Due   INFLUENZA VACCINE  05/22/2023   HIV Screening  10/25/2023 (Originally 03/21/1995)   COVID-19 Vaccine (5 - 2023-24 season) 11/10/2023 (Originally 06/21/2022)   DTaP/Tdap/Td (5 - Td or Tdap) 01/26/2027   Hepatitis C Screening  Completed   Pneumococcal Vaccine 33-40 Years old  Aged Out   HPV  VACCINES  Aged Out    Discussed health benefits of physical activity, and encouraged him to engage in regular exercise appropriate for his age and condition.  Problem List Items Addressed This Visit       Musculoskeletal and Integument   Osteoporosis    Vitamin D levels on labs today.      Relevant Orders   Lipid Panel With LDL/HDL Ratio   CMP14+EGFR   CBC   Testosterone, Free, Total, SHBG   VITAMIN D 25 Hydroxy (Vit-D Deficiency, Fractures)   CBC with Differential/Platelet     Other   Medication monitoring encounter   Relevant Orders   Lipid Panel With LDL/HDL Ratio   CMP14+EGFR   CBC   Testosterone, Free, Total, SHBG   VITAMIN D 25 Hydroxy (Vit-D Deficiency, Fractures)   CBC with Differential/Platelet   Other Visit Diagnoses     Wellness examination    -  Primary   Relevant Orders   Lipid Panel With LDL/HDL Ratio   CMP14+EGFR   CBC   Testosterone, Free, Total, SHBG   VITAMIN D 25 Hydroxy (Vit-D Deficiency, Fractures)   CBC with Differential/Platelet      Will check testosterone levels today.  Continue to keep foreskin area clean and if develops any rash or skin changes then please let me know..  Do wonder if the right chronic scrotal swelling could be contributing as well as possible underlying inguinal hernia etc.  Also consider vascular issues but typically this would be more related to peripheral vascular disease related to atherosclerosis and he would be young to really be having significant issues with this.  Keep up a regular exercise program and make sure you are eating a healthy diet Try to eat 4 servings of dairy a day, or if you are lactose intolerant take a calcium with vitamin D daily.  Your vaccines are up to date. Will get flu vac through work.   No follow-ups on file.     Nani Gasser, MD

## 2023-06-04 NOTE — Assessment & Plan Note (Signed)
Vitamin D levels on labs today.

## 2023-06-05 ENCOUNTER — Other Ambulatory Visit (HOSPITAL_COMMUNITY): Payer: Self-pay

## 2023-06-05 ENCOUNTER — Other Ambulatory Visit: Payer: Self-pay

## 2023-06-06 ENCOUNTER — Other Ambulatory Visit (HOSPITAL_COMMUNITY): Payer: Self-pay

## 2023-06-06 LAB — CBC WITH DIFFERENTIAL/PLATELET
Basophils Absolute: 0 10*3/uL (ref 0.0–0.2)
Basos: 1 %
EOS (ABSOLUTE): 0.4 10*3/uL (ref 0.0–0.4)
Eos: 7 %
Hematocrit: 51 % (ref 37.5–51.0)
Hemoglobin: 17.6 g/dL (ref 13.0–17.7)
Immature Grans (Abs): 0 10*3/uL (ref 0.0–0.1)
Immature Granulocytes: 0 %
Lymphocytes Absolute: 1.1 10*3/uL (ref 0.7–3.1)
Lymphs: 21 %
MCH: 31 pg (ref 26.6–33.0)
MCHC: 34.5 g/dL (ref 31.5–35.7)
MCV: 90 fL (ref 79–97)
Monocytes Absolute: 0.5 10*3/uL (ref 0.1–0.9)
Monocytes: 10 %
Neutrophils Absolute: 3.2 10*3/uL (ref 1.4–7.0)
Neutrophils: 61 %
Platelets: 238 10*3/uL (ref 150–450)
RBC: 5.67 x10E6/uL (ref 4.14–5.80)
RDW: 11.5 % — ABNORMAL LOW (ref 11.6–15.4)
WBC: 5.2 10*3/uL (ref 3.4–10.8)

## 2023-06-06 LAB — CMP14+EGFR
ALT: 29 IU/L (ref 0–44)
AST: 19 IU/L (ref 0–40)
Albumin: 4.8 g/dL (ref 4.1–5.1)
Alkaline Phosphatase: 84 IU/L (ref 44–121)
BUN/Creatinine Ratio: 14 (ref 9–20)
BUN: 14 mg/dL (ref 6–24)
Bilirubin Total: 0.8 mg/dL (ref 0.0–1.2)
CO2: 26 mmol/L (ref 20–29)
Calcium: 9.7 mg/dL (ref 8.7–10.2)
Chloride: 101 mmol/L (ref 96–106)
Creatinine, Ser: 1.02 mg/dL (ref 0.76–1.27)
Globulin, Total: 2.5 g/dL (ref 1.5–4.5)
Glucose: 89 mg/dL (ref 70–99)
Potassium: 4.5 mmol/L (ref 3.5–5.2)
Sodium: 140 mmol/L (ref 134–144)
Total Protein: 7.3 g/dL (ref 6.0–8.5)
eGFR: 94 mL/min/{1.73_m2} (ref >59)

## 2023-06-06 LAB — LIPID PANEL WITH LDL/HDL RATIO
Cholesterol, Total: 183 mg/dL (ref 100–199)
HDL: 39 mg/dL — ABNORMAL LOW (ref >39)
LDL Chol Calc (NIH): 116 mg/dL — ABNORMAL HIGH (ref 0–99)
LDL/HDL Ratio: 3 ratio (ref 0.0–3.6)
Triglycerides: 155 mg/dL — ABNORMAL HIGH (ref 0–149)
VLDL Cholesterol Cal: 28 mg/dL (ref 5–40)

## 2023-06-06 LAB — TESTOSTERONE, FREE, TOTAL, SHBG
Sex Hormone Binding: 49.2 nmol/L (ref 16.5–55.9)
Testosterone, Free: 6.3 pg/mL — ABNORMAL LOW (ref 6.8–21.5)
Testosterone: 651 ng/dL (ref 264–916)

## 2023-06-06 LAB — VITAMIN D 25 HYDROXY (VIT D DEFICIENCY, FRACTURES): Vit D, 25-Hydroxy: 49.1 ng/mL (ref 30.0–100.0)

## 2023-06-07 ENCOUNTER — Other Ambulatory Visit (HOSPITAL_COMMUNITY): Payer: Self-pay

## 2023-06-07 ENCOUNTER — Other Ambulatory Visit: Payer: Self-pay | Admitting: Family Medicine

## 2023-06-07 DIAGNOSIS — I700 Atherosclerosis of aorta: Secondary | ICD-10-CM

## 2023-06-07 NOTE — Progress Notes (Signed)
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 Presented 2011 with nodular infiltrates and progressive weakness/neuropathy. Bronchoscoped 2011- Neg.  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 FVC 4.91/73%, FEV1 4.19/83%, FEV1/FVC 0.85, FEF 25-75% 0.85, TLC 78%, DLCO 113% PFT 06/08/2014-normal lung volumes, minimal slowing and small airway flows with response to bronchodilator, normal diffusion CXR 08/30/2014-Emphysematous and bronchitic changes consistent with COPD. Bronchoscopy 2019-Dr. Sood-BAL-normal flora.  Aspergillus Ag BAL 0.10 WNL, fungal cultures negative, AFB neg. cytology benign. PFT [facility], 03/10/2019 without interpretation- numerical results scanned to media. 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 Aortic Atherosclerosis,  Presented 2011 with nodular infiltrates and progressive weakness/neuropathy. Induced sputum> M. Chelonae. Parallel f/u with Dr Patel/ Pulmonary @[facility] - agrees with conservative plan for annual CT, holding bronch searching for NTM pending progression. Advised staying off macrolides in case needed for NTM in  future. -Symbicort, Albuterol hfa, neb albuterol, ipratropium 0.06% nasal Covid vax- 4 Phizer Flu vax-had  [facility] Pulmonary 10/29/22- Dr [facility] for updated CT, daily albuterol nebs, exercise [facility] Rheumatology 10/29/22- continues maintenance rituximab He is optimistic about Rituxan but still aware of a tendency to retain secretions.  We talked about changing from Symbicort to remove the steroid component.  We discussed adding a pneumatic vest to do more than his Flutter device can do it to help secretion clearance.  We are also going to try hypertonic nebulizer solution. Discussed ILD/ not UIP on his CT....smartvest --Patient has has daily productive cough lasting greater than 12 months requiring antibiotic therapy.  Flutter valve has been tried, but has not effectively mobilized secretions.  Considered and ruled out manual CPT as no consistent skilled caregiver available to perform therapy.  CT scan performed on 12/03/22 confirms bronchiectasis.  Starting patient on vest therapy. HRCT chest 12/02/22- IMPRESSION: 1. Patchy peribronchovascular nodularity, upper and midlung zone predominant, unchanged from 11/26/2021 but progressive from 03/08/2020. Given history of microscopic polyangiitis, findings are compatible with indolent progression of disease. Findings are suggestive of an alternative diagnosis (not UIP) per consensus guidelines: Diagnosis of Idiopathic Pulmonary Fibrosis: An Official ATS/ERS/JRS/ALAT Clinical Practice Guideline. Am Rosezetta Schlatter Crit Care Med Vol 198, Iss 5, [phone], Jun 21 2017. 2. Left anterior descending coronary artery calcification, age advanced.  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 Aortic Atherosclerosis,  Presented 2011 with nodular infiltrates and progressive weakness/neuropathy. Induced sputum> M. Chelonae. Parallel f/u with Dr Patel/ Pulmonary @[facility] - agrees with conservative plan for annual CT, holding bronch searching for NTM pending progression. Advised staying off macrolides in case needed for NTM in future. -Symbicort, Albuterol hfa, neb albuterol, ipratropium 0.06% nasal, rituxan,  Covid vax- 4 Phizer Flu vax-had  [facility] Pulmonary 10/29/22- Dr [facility] for updated CT, daily albuterol nebs, exercise [facility] Rheumatology 10/29/22- continues maintenance rituximab ACT score 25 Doing well on rituxan "changes everything" Anoro  changed to Symbicort, used only when needed to minimize steroid. Getting PT for osteoporosis. Immunosuppressed. Gets vaccines from PCP and masks for protection. We agreed to update PFT next year, HRCT in 2 years.   ROS-see HPI + = positive Constitutional:   No-   weight loss, night sweats, fevers, chills, fatigue, lassitude. HEENT:   No-  headaches, difficulty swallowing, tooth/dental problems, sore throat,       No-  sneezing, itching, +ear ache,+ nasal congestion, post nasal drip,  CV:  No-   chest pain, orthopnea, PND, swelling in lower extremities, anasarca,                                                    dizziness, palpitations Resp: shortness of breath with exertion or at rest.              No-   productive cough,  + non-productive cough,  No- coughing up of blood.              No-   change in color of mucus.  + wheezing.   Skin: No-   rash or lesions. GI:  No-   heartburn, indigestion, abdominal pain, nausea, vomiting,  GU:  MS:  +  joint pain or swelling.   Neuro-     nothing unusual Psych:  No- change in mood or affect. No depression or anxiety.  No memory loss.  OBJ- Physical Exam General- Alert, Oriented, Affect-appropriate,  Distress- none acute,+ tall, thin,  Skin- no rash Lymphadenopathy- none Head- atraumatic            Eyes- Gross vision intact, PERRLA, conjunctivae and secretions clear            Ears- Hearing, canals-normal            Nose- + marked turbinate edema, no-Septal dev, mucus, polyps, erosion, perforation             Throat- Mallampati II , mucosa clear , drainage- none, tonsils- atrophic Neck- flexible , trachea midline, no stridor , thyroid nl, carotid no bruit Chest - symmetrical excursion , unlabored           Heart/CV- RRR , no murmur , no gallop  , no rub, nl s1 s2                           - JVD- none , edema- none, stasis changes- none, varices- none           Lung- + coarse / wet airway sounds, no- wheeze or fine rhonchi, unlabored,  cough-none , dullness-none, rub- none,            Chest wall-  Abd-  Br/ Gen/ Rectal- Not done, not indicated Extrem- cyanosis- none, clubbing, none, atrophy- none, strength- nl Neuro- grossly intact to observation

## 2023-06-09 ENCOUNTER — Ambulatory Visit: Payer: Commercial Managed Care - PPO | Admitting: Internal Medicine

## 2023-06-09 ENCOUNTER — Encounter: Payer: Self-pay | Admitting: Internal Medicine

## 2023-06-09 ENCOUNTER — Other Ambulatory Visit (HOSPITAL_COMMUNITY): Payer: Self-pay

## 2023-06-09 ENCOUNTER — Other Ambulatory Visit: Payer: Self-pay

## 2023-06-09 ENCOUNTER — Encounter: Payer: Self-pay | Admitting: Family Medicine

## 2023-06-09 VITALS — BP 118/74 | HR 79 | Temp 97.6°F | Ht 78.0 in | Wt 198.6 lb

## 2023-06-09 DIAGNOSIS — M317 Microscopic polyangiitis: Secondary | ICD-10-CM | POA: Diagnosis not present

## 2023-06-09 DIAGNOSIS — R7989 Other specified abnormal findings of blood chemistry: Secondary | ICD-10-CM

## 2023-06-09 DIAGNOSIS — J45909 Unspecified asthma, uncomplicated: Secondary | ICD-10-CM | POA: Diagnosis not present

## 2023-06-09 MED ORDER — BUDESONIDE-FORMOTEROL FUMARATE 160-4.5 MCG/ACT IN AERO
2.0000 | INHALATION_SPRAY | Freq: Two times a day (BID) | RESPIRATORY_TRACT | 12 refills | Status: AC
  Filled 2023-06-09: qty 10.2, 30d supply, fill #0

## 2023-06-09 MED ORDER — PRAVASTATIN SODIUM 40 MG PO TABS
40.0000 mg | ORAL_TABLET | Freq: Every day | ORAL | 3 refills | Status: DC
Start: 2023-06-09 — End: 2024-06-14
  Filled 2023-06-09: qty 90, 90d supply, fill #0
  Filled 2023-09-22: qty 90, 90d supply, fill #1
  Filled 2023-12-18: qty 90, 90d supply, fill #2
  Filled 2024-03-17: qty 90, 90d supply, fill #3

## 2023-06-09 NOTE — Patient Instructions (Addendum)
Symbicort refilled at [facility]  Please call if we can help  I can see you again in a year and as discussed, we can repeat CT in 2 years.

## 2023-06-09 NOTE — Progress Notes (Signed)
Hi BJ, triglycerides and LDL just a borderline elevated.  Overall cardiovascular risk score is low so just continue to work on healthy food choices and regular exercise.  Total testosterone looks good but the free testosterone which is what we call the bioavailable is a little borderline low.  We could always reevaluate that in a couple of weeks to verify if that is accurate.  We usually get 2 levels 2 weeks apart.  Vitamin D looks great.  Hemoglobin is normal.

## 2023-06-10 ENCOUNTER — Ambulatory Visit (INDEPENDENT_AMBULATORY_CARE_PROVIDER_SITE_OTHER): Payer: Commercial Managed Care - PPO | Admitting: Physical Therapy

## 2023-06-10 ENCOUNTER — Encounter: Payer: Self-pay | Admitting: Physical Therapy

## 2023-06-10 ENCOUNTER — Other Ambulatory Visit: Payer: Self-pay

## 2023-06-10 DIAGNOSIS — M542 Cervicalgia: Secondary | ICD-10-CM

## 2023-06-10 DIAGNOSIS — M5459 Other low back pain: Secondary | ICD-10-CM | POA: Diagnosis not present

## 2023-06-10 DIAGNOSIS — M6281 Muscle weakness (generalized): Secondary | ICD-10-CM

## 2023-06-10 NOTE — Therapy (Addendum)
OUTPATIENT PHYSICAL THERAPY TREATMENT NOTE     Patient Name: Jesse ROELFS, [name] MRN: [id number] DOB:18-May-1981, 42 y.o., male Today's Date: 06/10/2023  PCP: Agapito Games, MD   REFERRING PROVIDER: Monica Becton, MD  END OF SESSION:   PT End of Session - 06/10/23 1100     Visit Number 19    Number of Visits 29    Date for PT Re-Evaluation 09/02/2023    PT Start Time 1100    PT Stop Time 1140    PT Time Calculation (min) 40 min    Activity Tolerance Patient tolerated treatment well    Behavior During Therapy [facility] for tasks assessed/performed             Past Medical History:  Diagnosis Date   Allergy    Arthritis    Asthma    Chronic prostatitis    Myalgia    Nose injury, initial encounter 06/16/2020   Pulmonary infiltrates    Scrotal pain 01/08/2023   Sinusitis, chronic    Tonsillar abscess    Vasculitis (HCC)    Past Surgical History:  Procedure Laterality Date   BRONCHOSCOPY  2011   TBBX ----inflammation   IR FLUORO GUIDE CV LINE RIGHT  01/31/2017   IR FLUORO GUIDE CV MIDLINE PICC RIGHT  01/30/2017   IR US GUIDE VASC ACCESS RIGHT  01/30/2017   peritonsilar abscess     TYMPANOPLASTY     VIDEO BRONCHOSCOPY Bilateral 06/25/2018   Procedure: VIDEO BRONCHOSCOPY WITHOUT FLUORO;  Surgeon: Coralyn Helling, MD;  Location: [facility] ENDOSCOPY;  Service: Cardiopulmonary;  Laterality: Bilateral;   Patient Active Problem List   Diagnosis Date Noted   Testicular swelling, right 01/08/2023   Radiculitis of left cervical region 12/30/2022   Left lumbar radiculitis 12/30/2022   Osteoporosis 12/30/2022   Long term current use of systemic steroids 05/17/2021   Closed fracture of nasal bones 06/19/2020   Coccygeal pain 06/16/2020   Atherosclerosis of aorta (HCC) 04/12/2019   Pulmonary infiltrates    Medication monitoring encounter 03/03/2018   Pulmonary nodule seen on imaging study 01/15/2018   Microscopic polyangiitis (HCC) 03/14/2016   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   Dyshidrotic eczema 04/08/2014   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   MYALGIA 06/28/2010   GERD 06/27/2010   Nonspecific (abnormal) findings on radiological and other examination of body structure 05/29/2010   CT, CHEST, ABNORMAL 05/29/2010   Asthma with bronchitis 04/09/2010   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     THERAPY DIAG:  Cervicalgia  Other low back pain  Muscle weakness (generalized)     REFERRING DIAG: M54.16 (ICD-10-CM) - Left lumbar radiculitis   Rationale for Evaluation and Treatment: Rehabilitation     ONSET DATE: 1.5 weeks ago   SUBJECTIVE:  SUBJECTIVE STATEMENT: 06/10/2023 States his thigh pain is better. He as been stretching it and he has pain lateral to the stretch. States that overall his pain is controlled with the exercises.  Eval: Patient reports intense neck pain that started 1 and half weeks ago.  Patient reports he was fine on Saturday where he went rollerskating and had no falls or issues but the next day his neck/shoulder/arm started to hurt.  Reports he played the guitar which increased his pain on this left side.  Reports he was given prednisone which did not change his symptoms and was also given gabapentin but has not started that yet.  Patient has a difficult time getting comfortable and pain is just continuous in nature.  He gets slight relief with immobilization but no relief with anything else he has tried at this time.   Pain radiates into left upper arm and at times into forearm.  Prior to this episode patient was having occasional symptoms but they would resolve after a short while.  During 1 of these episode he did have some hand tingling but has not had it since.   Patient also has a current T7 compression fracture which was  found during a CT scan of his chest, reports he does have pain in his lower thoracic (points to around T12) but his neck is his biggest issue at this time. Patient is right-handed but is ambidextrous and some activities.   PERTINENT HISTORY:  Osteoporosis, Wegener's granulomatosis (long term steroid use), psoriatic arthritis, T7 compression fracture   PAIN:  Are you having pain? Yes: NPRS scale: 0/10    PRECAUTIONS: None   WEIGHT BEARING RESTRICTIONS: No   FALLS:  Has patient fallen in last 6 months? No     OCCUPATION: nuclear physicist   PLOF: Independent   PATIENT GOALS: to have less pain and be able to play guitar       OBJECTIVE:    DIAGNOSTIC FINDINGS: 12/30/22 Xray cervical spine  FINDINGS: There is no evidence of cervical spine fracture or prevertebral soft tissue swelling. Alignment is normal. Mild endplate osteophyte formation is present at C5-C6. No significant neural foraminal stenosis bilaterally. The dens is intact and lateral masses are symmetric.   IMPRESSION: Mild degenerative changes at C5-C6.   Xray Lumbar spine FINDINGS: Alignment is anatomic. Vertebral body and disc space heights are normal. No degenerative changes. No definite pars defects.   IMPRESSION: No findings to explain the patient's pain.   CT chest 2 months ago  Musculoskeletal: Degenerative changes in the spine. Slight compression of the T7 vertebral body, unchanged. No worrisome lytic or sclerotic lesions.     COGNITION: Overall cognitive status: Within functional limits for tasks assessed                                  POSTURE: forward head and flat thoracic spine                   Cervical  AROM:   04/22/23     Flexion  WFL     Extension WFL     R ROT  WFL     L ROT  WFL     R SB WFL    L SB WFL                  (Blank rows = not tested)  UE Measurements       Upper Extremity Right 04/22/23 Left 04/22/23    A/PROM MMT A/PROM MMT   Shoulder Flexion 160 4 160 4  Shoulder Extension       Shoulder Abduction  WFL  4+ WFL  4+   Shoulder Adduction          Shoulder Internal Rotation Reaches to T6 SP   5  Reaches to T6 SP 4+  Shoulder External Rotation Reaches to T2 SP   4+ Reaches to T2 SP    4+  Elbow Flexion          Elbow Extension          Wrist Flexion          Wrist Extension          Wrist Supination          Wrist Pronation          Wrist Ulnar Deviation          Wrist Radial Deviation          Grip Strength NA   NA                          (Blank rows = not tested)                       * pain in lower back (and in shoulders) - hinges at lower T spine with shoulder flexion    ** increased neuropathy        TODAY'S TREATMENT:                                                                                                                              DATE:   06/10/2023  Therapeutic Exercise: Review of HEP  Supine: thomas stretch x3 30-45" holds, hamstring stretch with anterior tilt x10 10" holds B  Standing:   Neuromuscular Re-education:  Therapeutic Activity: Self Care: Trigger Point Dry Needling:  Manual therapy: STM and IASTM with percussion gun along intermuscular junction of left thigh- anterior and posterior- tolerated well   PATIENT EDUCATION:  Education details: on use of percussion gun for chest vibration and how to use Person educated: Patient Education method: Explanation, Demonstration, and Handouts Education comprehension: verbalized understanding       HOME EXERCISE PROGRAM: 3/19 box breathing/pain relief strategies [id number]  - previous with long exhale breathing     ASSESSMENT:   CLINICAL IMPRESSION: 06/10/2023 Session focused on left lateral thigh pain. Tenderness along intermuscular junctoins of left ITB. Educated patient in how to stretch this and mobilize this area. Reviewed HEP. Educated patient on use of percussion gun for chest vibration. Tolerated well. Slight distal  ITB pain noted end of session. Will continue to f/u and address with DN or metal tool work as needed next session.  Extending POC to continue to work on posture, cervical  symptoms and include LE symptoms moving forward to reduce risk of re-injury and reduce stress on spine. Patient would greatly benefit from continue skilled PT based on multiple co-morbidities and improving overall function and independence at home and work.  Eval: Patient presents to physical therapy today with severe neck and left upper arm pain that started about 1.5 weeks ago.  Assessment limited secondary to high levels of pain on this date.  Greatest relief felt with cervical traction which patient was educated on as well as other pain management strategies.  Discussed current pain response and importance of addressing pain  to down regulate central nervous system.  Patient severely limited in range of motion, function, sleep and overall quality of life secondary to pain and physical limitations at this time and would greatly benefit from skilled physical therapy.   OBJECTIVE IMPAIRMENTS: decreased activity tolerance, decreased ROM, decreased strength, improper body mechanics, postural dysfunction, and pain.    ACTIVITY LIMITATIONS: lifting, bending, sitting, sleeping, dressing, reach over head, and locomotion level   PARTICIPATION LIMITATIONS: community activity and occupation, playing guitar   PERSONAL FACTORS: 1-2 comorbidities: osteoporosis, vasculitis,  are also affecting patient's functional outcome.    REHAB POTENTIAL: Good   CLINICAL DECISION MAKING: Evolving/moderate complexity   EVALUATION COMPLEXITY: Moderate     GOALS: Goals reviewed with patient? yes   SHORT TERM GOALS: Target date: 1012/2024 Patient will be independent in self management strategies to improve quality of life and functional outcomes. Baseline: New Program Goal status: MET   2.  Patient will report at least 50% improvement in overall  symptoms and/or function to demonstrate improved functional mobility Baseline: 0% better Goal status: MET   3.  Patient will be able to find comfortable position at night to improve ability to get comfortable and fall asleep Baseline: Difficult finding comfortable position Goal status:MET   4.  Patient will demonstrate pain-free cervical range of motion in all directions Baseline: Painful Goal status: MET   5.  Patient will report no return of UE radicular symptoms after weaning from medication for radicular symptoms per MD approval to return to PLOF. Baseline: currently requires medication Goal status: NEW        LONG TERM GOALS: Target date: 09/02/2023   Patient will report at least 75% improvement in overall symptoms and/or function to demonstrate improved functional mobility Baseline: 0% better Goal status: MET   2.  Patient will be able to play guitar without pain or difficulties to return to prior level of function Baseline: Painful/difficult Goal status:  MET   3.  Patient report no radicular symptoms in left upper extremity to improve quality of life. Baseline: Current radicular symptoms into left upper extremity Goal status: MET       4. Patient will report at least 90% improvement in overall symptoms and/or function to demonstrate improved functional mobility   Current: 80% better Goal status: PROGRESSING  5. 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    Current: unable Goal status: MET   6. Patient will  report no radicular symptoms down left lower extremity to improve QOL and ability to walk after sitting.    Current: symptoms down left lateral thigh Goal status: NEW   7. Patient will report performing regular mobility and strengthening routine at work to reduce stress on spine throughout the day. Current: Not daily Goal status: NEW  PLAN:  PT FREQUENCY: 1-2x/week  for a total of 10 visits over 12 week certification period   PT DURATION: 12 weeks   PLANNED INTERVENTIONS: Therapeutic exercises, Therapeutic activity, Neuromuscular re-education, Balance training, Gait training, Patient/Family education, Self Care, Joint mobilization, Joint manipulation, Stair training, Canalith repositioning, Orthotic/Fit training, DME instructions, Aquatic Therapy, Dry Needling, Electrical stimulation, Spinal manipulation, Spinal mobilization, Cryotherapy, Moist heat, Traction, Ionotophoresis 4mg /ml Dexamethasone, Manual therapy, and Re-evaluation.   PLAN FOR NEXT SESSION: Follow-up with neural stretches to upper extremity, progress breathing exercises    12:09 PM, 06/18/23 Tereasa Coop, DPT Physical Therapy with [facility]

## 2023-06-12 ENCOUNTER — Other Ambulatory Visit (HOSPITAL_COMMUNITY): Payer: Self-pay

## 2023-06-12 ENCOUNTER — Ambulatory Visit: Payer: Commercial Managed Care - PPO | Admitting: "Endocrinology

## 2023-06-12 ENCOUNTER — Encounter: Payer: Self-pay | Admitting: "Endocrinology

## 2023-06-12 VITALS — BP 120/80 | HR 83 | Ht 78.0 in | Wt 193.8 lb

## 2023-06-12 DIAGNOSIS — G252 Other specified forms of tremor: Secondary | ICD-10-CM | POA: Diagnosis not present

## 2023-06-12 DIAGNOSIS — M818 Other osteoporosis without current pathological fracture: Secondary | ICD-10-CM

## 2023-06-12 MED ORDER — ALENDRONATE SODIUM 70 MG PO TABS
70.0000 mg | ORAL_TABLET | ORAL | 3 refills | Status: DC
  Filled 2023-06-12 – 2023-07-12 (×2): qty 12, 84d supply, fill #0
  Filled 2023-09-22 – 2023-10-06 (×3): qty 12, 84d supply, fill #1
  Filled 2023-12-18: qty 12, 84d supply, fill #2
  Filled 2024-03-17: qty 12, 84d supply, fill #3

## 2023-06-12 NOTE — Patient Instructions (Signed)
Cardomom pods for acid reflux as needed

## 2023-06-12 NOTE — Progress Notes (Signed)
[facility] Note Jesse [place], MD    Referring Provider: Agapito Games, * Primary Care Provider: Agapito Games, MD No chief complaint on file.  Assessment & Plan  Diagnoses and all orders for this visit:  Other osteoporosis without current pathological fracture  Fine tremor -     TSH; Future -     T4, free; Future  Other orders -     alendronate (FOSAMAX) 70 MG tablet; Take 1 tablet (70 mg total) by mouth every 7 (seven) days. Take with a full glass of water on an empty stomach.   Osteoporosis Likely secondary cause from steroids intake. History of ANCA vasculitis, long term use of steroids, psoriatic arthritis. Has used fosamax years ago for 2 years. Started fosamax on 03/19/23 with some GERD but doesn't want reclast. Discussed about Cardomom pods for acid reflux as needed. Discussed other treatment options as well including zoledronic acid and teriparatide. 04/2022 The BMD measured at Femur Neck Left is 0.716 g/cm2 with a Z-score of -2.4. Recommend to use calcium carbonate 600 mg twice daily and vitamin D 2000 units OTC supplements.  Recommend weight bearing exercise options and dietary supplements.   Educated on risks and side effects of fosamax including but not limited to esophagitis, worsening GERD, atypical femoral fractures and osteonecrosis of the jaw. Advised to take medication first thing in the morning with plenty of water and stay upright for 30 minutes after taking the medication.  Advised fall precautions, adequate dairy in diet and exercises (aerobic, balancing and weight bearing) as tolerated.  Fine hand tremor noted B/L No thyroid labs seen Ordered baseline thyroid labs     Return in about 6 months (around 12/13/2023) for tele-visit: 3:20 pm.  I have reviewed current medications, nurse's notes, allergies, vital signs, past medical and surgical history, family medical history, and social history for this encounter. Counseled  patient on symptoms, examination findings, lab findings, imaging results, treatment decisions and monitoring and prognosis. The patient understood the recommendations and agrees with the treatment plan. All questions regarding treatment plan were fully answered.   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  He is currently taking Calcium and vitamin D 1000 international units daily.  Risk Factors screening:  History of low trauma fractures: No Family history of osteoporosis: Yes Hip fracture in first-degree relatives: No Smoking history: No Excessive alcohol intake >2 drinks/day: No Excessive caffeine intake >2 drinks/day: No Glucocorticoid use >5mg  prednisone/day for >3 months: Yes: has ANCA vasculitis, had 1 gm solumedrol previously   Rheumatoid arthritis history: No   Physical Exam  BP 120/80   Pulse 83   Ht 6\' 6"  (1.981 m)   Wt 193 lb 12.8 oz (87.9 kg)   SpO2 98%   BMI 22.40 kg/m  Constitutional: well developed, well nourished Head: normocephalic, atraumatic Eyes: sclera anicteric, no redness Neck: supple, no thyromegaly/thyroid tenderness/thyroid nodule  Lungs: normal respiratory effort Neurology: alert and oriented Skin: dry, no appreciable rashes Musculoskeletal: no appreciable defects, + B/L find hand tremor  Psychiatric: normal mood and affect  Allergies Allergies  Allergen Reactions   Gabapentin Other (See Comments)    Tinnitis, and hearing loss   Lipitor [Atorvastatin] Other (See Comments)    myalgias   Ruxience [Rituximab-Pvvr] Itching    Itching in back of throat and nose  Current Medications Patient's Medications  New Prescriptions   No medications on file  Previous Medications   ALBUTEROL (PROVENTIL) (2.5 MG/3ML) 0.083% NEBULIZER SOLUTION    Inhale 1 vial via nebulizer every 6 hours as needed for wheezing.    ALBUTEROL (VENTOLIN HFA) 108 (90 BASE) MCG/ACT INHALER    Inhale 2 puffs into the lungs every 6 (six) hours as needed for wheezing or shortness of breath.   AZELASTINE HCL 0.15 % SOLN    Place 1 spray into the nose 2 (two) times daily.   BUDESONIDE-FORMOTEROL (SYMBICORT) 160-4.5 MCG/ACT INHALER    Inhale 2 puffs into the lungs 2 (two) times daily.   CALCIUM PO    Take 1 tablet by mouth 2 (two) times daily.   CHOLECALCIFEROL (VITAMIN D) 1000 UNITS TABLET    Take 1 tablet (1,000 Units total) by mouth daily.   FLUTICASONE (FLONASE) 50 MCG/ACT NASAL SPRAY    INSTILL 2 SPRAYS INTO BOTH NOSTRILS DAILY (NEED APPT FOR FURTHER REFILLS)   MULTIPLE VITAMIN (MULTIVITAMIN WITH MINERALS) TABS TABLET    Take 1 tablet by mouth 2 (two) times daily.   MUPIROCIN OINTMENT (BACTROBAN) 2 %    Use with nasal rinse bottle   NAPROXEN (NAPROSYN) 500 MG TABLET    Take 1 tablet (500 mg total) by mouth 2 (two) times daily with a meal.   NEBULIZERS (COMPRESSOR/NEBULIZER) MISC    Use as direccted   OMEPRAZOLE (PRILOSEC) 20 MG CAPSULE    Take 1 capsule (20 mg total) by mouth daily.   PRAVASTATIN (PRAVACHOL) 40 MG TABLET    Take 1 tablet by mouth daily.   PREGABALIN (LYRICA) 50 MG CAPSULE    Take 1 capsule (50 mg total) by mouth every evening for 7 days, THEN 1 capsule (50 mg total) 2 (two) times daily for 7 days, THEN 1 capsule (50 mg total) 3 (three) times daily for 23 days.   SODIUM CHLORIDE HYPERTONIC 3 % NEBULIZER SOLUTION    Take 3 mLs by nebulization 3 (three) times daily.  Modified Medications   Modified Medication Previous Medication   ALENDRONATE (FOSAMAX) 70 MG TABLET alendronate (FOSAMAX) 70 MG tablet      Take 1 tablet (70 mg total) by mouth every 7 (seven) days. Take with a full glass of water on an empty stomach.    Take 1 tablet (70 mg total) by mouth every 7 (seven) days. Take with a full glass of water on an empty stomach.  Discontinued Medications   No medications on file     Past Medical History Past  Medical History:  Diagnosis Date   Allergy    Arthritis    Asthma    Chronic prostatitis    Myalgia    Nose injury, initial encounter 06/16/2020   Pulmonary infiltrates    Scrotal pain 01/08/2023   Sinusitis, chronic    Tonsillar abscess    Vasculitis (HCC)     Past Surgical History Past Surgical History:  Procedure Laterality Date   BRONCHOSCOPY  2011   TBBX ----inflammation   IR FLUORO GUIDE CV LINE RIGHT  01/31/2017   IR FLUORO GUIDE CV MIDLINE PICC RIGHT  01/30/2017   IR US GUIDE VASC ACCESS RIGHT  01/30/2017   peritonsilar abscess     TYMPANOPLASTY     VIDEO BRONCHOSCOPY Bilateral 06/25/2018   Procedure: VIDEO BRONCHOSCOPY WITHOUT FLUORO;  Surgeon: Coralyn Helling, MD;  Location: [facility];  Service: Cardiopulmonary;  Laterality: Bilateral;    Family History family history includes Heart  attack in an other family member; Heart disease in his paternal grandfather; Hyperlipidemia in his father; Thyroid disease in his father.  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  Other Topics Concern   Not on file  Social History Narrative   Not on file   Social Determinants of Health   Financial Resource Strain: Not on file  Food Insecurity: Not on file  Transportation Needs: Not on file  Physical Activity: Insufficiently Active (06/04/2023)   Exercise Vital Sign    Days of Exercise per Week: 7 days    Minutes of Exercise per Session:  20 min  Stress: Not on file  Social Connections: Not on file  Intimate Partner Violence: Not on file    Laboratory Investigations No components found for: "CMP" No components found for: "BMP" Lab Results  Component Value Date   GFR 87.26 03/13/2023   Lab Results  Component Value Date   CREATININE 1.02 06/04/2023   No results found for: "CBC" No components found for: "LFT" No components found for: "VITD" Lab Results  Component Value Date   PTH 18 03/13/2023   Lab Results  Component Value Date   TSH 3.75 04/12/2019   No components found for: "RENAL FUNCTION" No components found for: "MAGNESIUM"  Parts of this note may have been dictated using voice recognition software. There may be variances in spelling and vocabulary which are unintentional. Not all errors are proofread. Please notify the [facility] Parkin if any discrepancies are noted or if the meaning of any statement is not clear.  Physical Exam  BP 120/80   Pulse 83   Ht 6\' 6"  (1.981 m)   Wt 193 lb 12.8 oz (87.9 kg)   SpO2 98%   BMI 22.40 kg/m    Constitutional: well developed, well nourished Head: normocephalic, atraumatic Eyes: sclera anicteric, no redness Neck: supple Lungs: normal respiratory effort Neurology: alert and oriented Skin: dry, no appreciable rashes Musculoskeletal: no appreciable defects Psychiatric: normal mood and affect   Current Medications Patient's Medications  New Prescriptions   No medications on file  Previous Medications   ALBUTEROL (PROVENTIL) (2.5 MG/3ML) 0.083% NEBULIZER SOLUTION    Inhale 1 vial via nebulizer every 6 hours as needed for wheezing.   ALBUTEROL (VENTOLIN HFA) 108 (90 BASE) MCG/ACT INHALER    Inhale 2 puffs into the lungs every 6 (six) hours as needed for wheezing or shortness of breath.   AZELASTINE HCL 0.15 % SOLN    Place 1 spray into the nose 2 (two) times daily.   BUDESONIDE-FORMOTEROL (SYMBICORT) 160-4.5 MCG/ACT INHALER    Inhale 2 puffs into the lungs 2 (two)  times daily.   CALCIUM PO    Take 1 tablet by mouth 2 (two) times daily.   CHOLECALCIFEROL (VITAMIN D) 1000 UNITS TABLET    Take 1 tablet (1,000 Units total) by mouth  daily.   FLUTICASONE (FLONASE) 50 MCG/ACT NASAL SPRAY    INSTILL 2 SPRAYS INTO BOTH NOSTRILS DAILY (NEED APPT FOR FURTHER REFILLS)   MULTIPLE VITAMIN (MULTIVITAMIN WITH MINERALS) TABS TABLET    Take 1 tablet by mouth 2 (two) times daily.   MUPIROCIN OINTMENT (BACTROBAN) 2 %    Use with nasal rinse bottle   NAPROXEN (NAPROSYN) 500 MG TABLET    Take 1 tablet (500 mg total) by mouth 2 (two) times daily with a meal.   NEBULIZERS (COMPRESSOR/NEBULIZER) MISC    Use as direccted   OMEPRAZOLE (PRILOSEC) 20 MG CAPSULE    Take 1 capsule (20 mg total) by mouth daily.   PRAVASTATIN (PRAVACHOL) 40 MG TABLET    Take 1 tablet by mouth daily.   PREGABALIN (LYRICA) 50 MG CAPSULE    Take 1 capsule (50 mg total) by mouth every evening for 7 days, THEN 1 capsule (50 mg total) 2 (two) times daily for 7 days, THEN 1 capsule (50 mg total) 3 (three) times daily for 23 days.   SODIUM CHLORIDE HYPERTONIC 3 % NEBULIZER SOLUTION    Take 3 mLs by nebulization 3 (three) times daily.  Modified Medications   Modified Medication Previous Medication   ALENDRONATE (FOSAMAX) 70 MG TABLET alendronate (FOSAMAX) 70 MG tablet      Take 1 tablet (70 mg total) by mouth every 7 (seven) days. Take with a full glass of water on an empty stomach.    Take 1 tablet (70 mg total) by mouth every 7 (seven) days. Take with a full glass of water on an empty stomach.  Discontinued Medications   No medications on file    Allergies Allergies  Allergen Reactions   Gabapentin Other (See Comments)    Tinnitis, and hearing loss   Lipitor [Atorvastatin] Other (See Comments)    myalgias   Ruxience [Rituximab-Pvvr] Itching    Itching in back of throat and nose     Past Medical History Past Medical History:  Diagnosis Date   Allergy    Arthritis    Asthma    Chronic prostatitis     Myalgia    Nose injury, initial encounter 06/16/2020   Pulmonary infiltrates    Scrotal pain 01/08/2023   Sinusitis, chronic    Tonsillar abscess    Vasculitis (HCC)     Past Surgical History Past Surgical History:  Procedure Laterality Date   BRONCHOSCOPY  2011   TBBX ----inflammation   IR FLUORO GUIDE CV LINE RIGHT  01/31/2017   IR FLUORO GUIDE CV MIDLINE PICC RIGHT  01/30/2017   IR US GUIDE VASC ACCESS RIGHT  01/30/2017   peritonsilar abscess     TYMPANOPLASTY     VIDEO BRONCHOSCOPY Bilateral 06/25/2018   Procedure: VIDEO BRONCHOSCOPY WITHOUT FLUORO;  Surgeon: Coralyn Helling, MD;  Location: [facility];  Service: Cardiopulmonary;  Laterality: Bilateral;    Family History family history includes Heart attack in an other family member; Heart disease in his paternal grandfather; Hyperlipidemia in his father; Thyroid disease in his father.  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  Other Topics Concern   Not on file  Social History Narrative   Not on file   Social Determinants of Health   Financial Resource Strain: Not on file  Food Insecurity: Not on file  Transportation Needs: Not on file  Physical Activity: Insufficiently Active (06/04/2023)   Exercise Vital Sign    Days of Exercise per Week: 7 days    Minutes of Exercise per Session: 20 min  Stress: Not on file  Social Connections: Not on file  Intimate Partner Violence: Not  on file    Lab Results  Component Value Date   CHOL 183 06/04/2023   Lab Results  Component Value Date   HDL 39 (L) 06/04/2023   Lab Results  Component Value Date   LDLCALC 116 (H) 06/04/2023   Lab Results  Component Value Date   TRIG 155 (H) 06/04/2023   Lab Results  Component Value Date   CHOLHDL 4.2 05/09/2022   Lab Results  Component Value Date   CREATININE 1.02 06/04/2023   Lab Results  Component Value Date   GFR 87.26 03/13/2023      Component Value Date/Time   NA 140 06/04/2023 0951   K 4.5 06/04/2023 0951   CL 101 06/04/2023 0951   CL 95 02/03/2017 0000   CO2 26 06/04/2023 0951   CO2 25 02/03/2017 0000   GLUCOSE 89 06/04/2023 0951   GLUCOSE 80 03/13/2023 1543   BUN 14 06/04/2023 0951   CREATININE 1.02 06/04/2023 0951   CREATININE 0.90 10/24/2022 1337   CALCIUM 9.7 06/04/2023 0951   CALCIUM 9.7 02/03/2017 0000   PROT 7.3 06/04/2023 0951   ALBUMIN 4.8 06/04/2023 0951   AST 19 06/04/2023 0951   ALT 29 06/04/2023 0951   ALKPHOS 84 06/04/2023 0951   BILITOT 0.8 06/04/2023 0951   GFRNONAA >60 12/04/2020 0848   GFRNONAA 108 06/16/2020 1708   GFRAA 125 06/16/2020 1708      Latest Ref Rng & Units 06/04/2023    9:51 AM 03/13/2023    3:43 PM 10/24/2022    1:37 PM  BMP  Glucose 70 - 99 mg/dL 89  80  88   BUN 6 - 24 mg/dL 14  21  14    Creatinine 0.76 - 1.27 mg/dL 0.86  5.78  4.69   BUN/Creat Ratio 9 - 20 14   SEE NOTE:   Sodium 134 - 144 mmol/L 140  142  141   Potassium 3.5 - 5.2 mmol/L 4.5  4.3  4.2   Chloride 96 - 106 mmol/L 101  103  103   CO2 20 - 29 mmol/L 26  32  32   Calcium 8.7 - 10.2 mg/dL 9.7  9.5    9.4  9.2        Component Value Date/Time   WBC 5.2 06/04/2023 0951   WBC 5.5 10/24/2022 1337   RBC 5.67 06/04/2023 0951   RBC 5.26 10/24/2022 1337   HGB 17.6 06/04/2023 0951   HCT 51.0 06/04/2023 0951   PLT 238 06/04/2023 0951   MCV 90 06/04/2023 0951   MCH 31.0 06/04/2023 0951   MCH 31.6 10/24/2022 1337   MCHC 34.5 06/04/2023 0951    MCHC 35.0 10/24/2022 1337   RDW 11.5 (L) 06/04/2023  0951   LYMPHSABS 1.1 06/04/2023 0951   MONOABS 0.5 12/04/2020 0848   EOSABS 0.4 06/04/2023 0951   BASOSABS 0.0 06/04/2023 0951   Lab Results  Component Value Date   TSH 3.75 04/12/2019   TSH 1.76 06/20/2016         Parts of this note may have been dictated using voice recognition software. There may be variances in spelling and vocabulary which are unintentional. Not all errors are proofread. Please notify the [facility] Parkin if any discrepancies are noted or if the meaning of any statement is not clear.

## 2023-06-14 ENCOUNTER — Encounter: Payer: Self-pay | Admitting: Internal Medicine

## 2023-06-14 NOTE — Assessment & Plan Note (Signed)
He does well, taking Symbicort for intervals when needed. Plan- future PFT for update

## 2023-06-14 NOTE — Assessment & Plan Note (Signed)
Feels much better on rituxan. Understands responsibilities of chronic immunosuppression. Plan- no change

## 2023-06-17 ENCOUNTER — Ambulatory Visit (INDEPENDENT_AMBULATORY_CARE_PROVIDER_SITE_OTHER): Payer: Commercial Managed Care - PPO | Admitting: Physical Therapy

## 2023-06-17 ENCOUNTER — Encounter: Payer: Self-pay | Admitting: Physical Therapy

## 2023-06-17 DIAGNOSIS — M5459 Other low back pain: Secondary | ICD-10-CM

## 2023-06-17 DIAGNOSIS — M542 Cervicalgia: Secondary | ICD-10-CM | POA: Diagnosis not present

## 2023-06-17 DIAGNOSIS — M6281 Muscle weakness (generalized): Secondary | ICD-10-CM | POA: Diagnosis not present

## 2023-06-17 NOTE — Therapy (Signed)
OUTPATIENT PHYSICAL THERAPY TREATMENT NOTE     Patient Name: Jesse CLASSON, [name] MRN: [id number] DOB:[birth year], 42 y.o., male Today's Date: 06/17/2023  PCP: Agapito Games, MD   REFERRING PROVIDER: Monica Becton, MD  END OF SESSION:   PT End of Session - 06/17/23 1601     Visit Number 20    Number of Visits 28    Date for PT Re-Evaluation 06/03/23    PT Start Time 1601    PT Stop Time 1644    PT Time Calculation (min) 43 min    Activity Tolerance Patient tolerated treatment well    Behavior During Therapy [facility] for tasks assessed/performed             Past Medical History:  Diagnosis Date   Allergy    Arthritis    Asthma    Chronic prostatitis    Myalgia    Nose injury, initial encounter 06/16/2020   Pulmonary infiltrates    Scrotal pain 01/08/2023   Sinusitis, chronic    Tonsillar abscess    Vasculitis (HCC)    Past Surgical History:  Procedure Laterality Date   BRONCHOSCOPY  2011   TBBX ----inflammation   IR FLUORO GUIDE CV LINE RIGHT  01/31/2017   IR FLUORO GUIDE CV MIDLINE PICC RIGHT  01/30/2017   IR US GUIDE VASC ACCESS RIGHT  01/30/2017   peritonsilar abscess     TYMPANOPLASTY     VIDEO BRONCHOSCOPY Bilateral 06/25/2018   Procedure: VIDEO BRONCHOSCOPY WITHOUT FLUORO;  Surgeon: Coralyn Helling, MD;  Location: [facility];  Service: Cardiopulmonary;  Laterality: Bilateral;   Patient Active Problem List   Diagnosis Date Noted   Testicular swelling, right 01/08/2023   Radiculitis of left cervical region 12/30/2022   Left lumbar radiculitis 12/30/2022   Osteoporosis 12/30/2022   Long term current use of systemic steroids 05/17/2021   Closed fracture of nasal bones 06/19/2020   Coccygeal pain 06/16/2020   Atherosclerosis of aorta (HCC) 04/12/2019   Pulmonary infiltrates    Medication monitoring encounter 03/03/2018   Pulmonary nodule seen on imaging study 01/15/2018   Microscopic polyangiitis (HCC) 03/14/2016   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   Dyshidrotic eczema 04/08/2014   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 States he still can't find a table that is the appropriate height   Eval: Patient reports intense neck pain that started 1 and half weeks ago.  Patient reports he was fine on Saturday where he went rollerskating and had no falls or issues but the next day his neck/shoulder/arm started to hurt.  Reports he played the guitar which increased his pain on this left side.  Reports he was given prednisone which did not change his symptoms and was also given gabapentin but has not started that yet.  Patient has a difficult time getting comfortable and pain is just continuous in nature.  He gets slight relief with immobilization but no relief with anything else he has tried at this time.   Pain radiates into left upper arm and at times into forearm.  Prior to this episode patient was having occasional symptoms but they would resolve after a short while.  During 1 of these episode he did have some hand tingling but has not had it since.   Patient also has a current T7 compression fracture which was found during a CT scan of  his chest, reports he does have pain in his lower thoracic (points to around T12) but his neck is his biggest issue at this time. Patient is right-handed but is ambidextrous and some activities.   PERTINENT HISTORY:  Osteoporosis, Wegener's granulomatosis (long term steroid use), psoriatic arthritis, T7 compression fracture   PAIN:  Are you having pain? Yes: NPRS scale: 0/10    PRECAUTIONS: None   WEIGHT BEARING RESTRICTIONS: No   FALLS:  Has patient fallen in last 6 months? No     OCCUPATION: nuclear physicist   PLOF: Independent   PATIENT GOALS: to have less pain and be able to play guitar       OBJECTIVE:    DIAGNOSTIC FINDINGS: 12/30/22 Xray cervical spine  FINDINGS: There is no evidence of cervical spine fracture or prevertebral soft tissue swelling. Alignment is normal. Mild endplate osteophyte formation is present at C5-C6. No significant neural foraminal stenosis bilaterally. The dens is intact and lateral masses are symmetric.   IMPRESSION: Mild degenerative changes at C5-C6.   Xray Lumbar spine FINDINGS: Alignment is anatomic. Vertebral body and disc space heights are normal. No degenerative changes. No definite pars defects.   IMPRESSION: No findings to explain the patient's pain.   CT chest 2 months ago  Musculoskeletal: Degenerative changes in the spine. Slight compression of the T7 vertebral body, unchanged. No worrisome lytic or sclerotic lesions.     COGNITION: Overall cognitive status: Within functional limits for tasks assessed                                  POSTURE: forward head and flat thoracic spine                   Cervical  AROM:   04/22/23     Flexion  WFL     Extension WFL     R ROT  WFL     L ROT  WFL     R SB WFL    L SB WFL                  (Blank rows = not tested)  UE Measurements       Upper Extremity Right 04/22/23 Left 04/22/23    A/PROM MMT A/PROM MMT  Shoulder Flexion 160 4 160 4   Shoulder Extension       Shoulder Abduction  WFL  4+ WFL  4+   Shoulder Adduction          Shoulder Internal Rotation Reaches to T6 SP   5  Reaches to T6 SP 4+  Shoulder External Rotation Reaches to T2 SP   4+ Reaches to T2 SP    4+  Elbow Flexion          Elbow Extension          Wrist Flexion          Wrist Extension          Wrist Supination          Wrist Pronation          Wrist Ulnar Deviation          Wrist Radial Deviation          Grip Strength NA   NA                          (Blank rows = not tested)                       * pain in lower back (and in shoulders) - hinges at lower T spine with shoulder flexion    ** increased neuropathy        TODAY'S TREATMENT:                                                                                                                              DATE:   06/17/2023  Therapeutic Exercise: Review of HEP  Supine: thomas stretch x3 30-45" holds, hamstring stretch with anterior tilt x10 10" holds B  Standing:   Neuromuscular Re-education:  Therapeutic Activity: Self Care: Trigger Point Dry Needling:  Manual therapy: STM and IASTM with metal tool to lateral quad and ITB,  Cupping to left lateral quad and ITB.   PATIENT EDUCATION:  Education details:on HEP Person educated: Patient Education method: Programmer, multimedia, Demonstration, and Handouts Education comprehension: verbalized understanding       HOME EXERCISE PROGRAM: 3/19 box breathing/pain relief strategies [id number]  - previous with long exhale breathing     ASSESSMENT:   CLINICAL IMPRESSION: 06/17/2023 Focused on manual interventions which were very uncomfortable for patient. Reduced pain noted afterwards but slight numbness and warmth noted to the area. Mild redness noted. Discussed no aggressive massage or manual for the next week. Will f/u with numbness next session. Will reassess next session and continue with current POC as tolerated.   Eval: Patient presents to  physical therapy today with severe neck and left upper arm pain that started about 1.5 weeks ago.  Assessment limited secondary to high  levels of pain on this date.  Greatest relief felt with cervical traction which patient was educated on as well as other pain management strategies.  Discussed current pain response and importance of addressing pain  to down regulate central nervous system.  Patient severely limited in range of motion, function, sleep and overall quality of life secondary to pain and physical limitations at this time and would greatly benefit from skilled physical therapy.   OBJECTIVE IMPAIRMENTS: decreased activity tolerance, decreased ROM, decreased strength, improper body mechanics, postural dysfunction, and pain.    ACTIVITY LIMITATIONS: lifting, bending, sitting, sleeping, dressing, reach over head, and locomotion level   PARTICIPATION LIMITATIONS: community activity and occupation, playing guitar   PERSONAL FACTORS: 1-2 comorbidities: osteoporosis, vasculitis,  are also affecting patient's functional outcome.    REHAB POTENTIAL: Good   CLINICAL DECISION MAKING: Evolving/moderate complexity   EVALUATION COMPLEXITY: Moderate     GOALS: Goals reviewed with patient? yes   SHORT TERM GOALS: Target date: 02/04/2023  Patient will be independent in self management strategies to improve quality of life and functional outcomes. Baseline: New Program Goal status: MET   2.  Patient will report at least 50% improvement in overall symptoms and/or function to demonstrate improved functional mobility Baseline: 0% better Goal status: MET   3.  Patient will be able to find comfortable position at night to improve ability to get comfortable and fall asleep Baseline: Difficult finding comfortable position Goal status:MET   4.  Patient will demonstrate pain-free cervical range of motion in all directions Baseline: Painful Goal status: MET        LONG TERM GOALS: Target date:  06/03/2023    Patient will report at least 75% improvement in overall symptoms and/or function to demonstrate improved functional mobility Baseline: 0% better Goal status: MET   2.  Patient will be able to play guitar without pain or difficulties to return to prior level of function Baseline: Painful/difficult Goal status:  MET   3.  Patient report no radicular symptoms in left upper extremity to improve quality of life. Baseline: Current radicular symptoms into left upper extremity Goal status: MET       4. Patient will report at least 90% improvement in overall symptoms and/or function to demonstrate improved functional mobility   Current: 80% better Goal status: PROGRESSING  5. 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    Current: unable Goal status: MET  PLAN:   PT FREQUENCY: 1-2x/week for a total of 16 visits over 12 week certification period   PT DURATION: 12 weeks   PLANNED INTERVENTIONS: Therapeutic exercises, Therapeutic activity, Neuromuscular re-education, Balance training, Gait training, Patient/Family education, Self Care, Joint mobilization, Joint manipulation, Stair training, Canalith repositioning, Orthotic/Fit training, DME instructions, Aquatic Therapy, Dry Needling, Electrical stimulation, Spinal manipulation, Spinal mobilization, Cryotherapy, Moist heat, Traction, Ionotophoresis 4mg /ml Dexamethasone, Manual therapy, and Re-evaluation.   PLAN FOR NEXT SESSION: Follow-up with neural stretches to upper extremity, progress breathing exercises     4:48 PM, 06/17/23 Tereasa Coop, DPT Physical Therapy with [facility]

## 2023-06-18 ENCOUNTER — Ambulatory Visit: Payer: Commercial Managed Care - PPO | Admitting: "Endocrinology

## 2023-06-18 ENCOUNTER — Other Ambulatory Visit: Payer: Self-pay

## 2023-06-18 DIAGNOSIS — R7989 Other specified abnormal findings of blood chemistry: Secondary | ICD-10-CM | POA: Diagnosis not present

## 2023-06-18 DIAGNOSIS — G252 Other specified forms of tremor: Secondary | ICD-10-CM | POA: Diagnosis not present

## 2023-06-18 NOTE — Addendum Note (Signed)
Addended by: Tereasa Coop R on: 06/18/2023 12:10 PM   Modules accepted: Orders

## 2023-06-18 NOTE — Addendum Note (Signed)
Addended by: Barnet Glasgow on: 06/18/2023 09:37 AM   Modules accepted: Orders

## 2023-06-19 LAB — TSH: TSH: 3.52 u[IU]/mL (ref 0.450–4.500)

## 2023-06-19 LAB — T4, FREE: Free T4: 1.31 ng/dL (ref 0.82–1.77)

## 2023-06-21 LAB — TESTOSTERONE, FREE, TOTAL, SHBG
Sex Hormone Binding: 53.3 nmol/L (ref 16.5–55.9)
Testosterone, Free: 6.5 pg/mL — ABNORMAL LOW (ref 6.8–21.5)
Testosterone: 737 ng/dL (ref 264–916)

## 2023-06-24 ENCOUNTER — Encounter: Payer: Self-pay | Admitting: Physical Therapy

## 2023-06-24 ENCOUNTER — Ambulatory Visit (INDEPENDENT_AMBULATORY_CARE_PROVIDER_SITE_OTHER): Payer: Commercial Managed Care - PPO | Admitting: Physical Therapy

## 2023-06-24 DIAGNOSIS — M542 Cervicalgia: Secondary | ICD-10-CM

## 2023-06-24 DIAGNOSIS — M6281 Muscle weakness (generalized): Secondary | ICD-10-CM

## 2023-06-24 DIAGNOSIS — M5459 Other low back pain: Secondary | ICD-10-CM | POA: Diagnosis not present

## 2023-06-24 NOTE — Therapy (Signed)
OUTPATIENT PHYSICAL THERAPY TREATMENT NOTE     Patient Name: Jesse GOLDBERG, [name] MRN: [id number] DOB:[birth year], 42 y.o., male Today's Date: 06/24/2023  PCP: Agapito Games, MD   REFERRING PROVIDER: Monica Becton, MD   END OF SESSION:   PT End of Session - 06/24/23 0844     Visit Number 21    Number of Visits 29    Date for PT Re-Evaluation 09/02/23    PT Start Time 0845    PT Stop Time 0925    PT Time Calculation (min) 40 min    Activity Tolerance Patient tolerated treatment well    Behavior During Therapy [facility] for tasks assessed/performed             Past Medical History:  Diagnosis Date   Allergy    Arthritis    Asthma    Chronic prostatitis    Myalgia    Nose injury, initial encounter 06/16/2020   Pulmonary infiltrates    Scrotal pain 01/08/2023   Sinusitis, chronic    Tonsillar abscess    Vasculitis (HCC)    Past Surgical History:  Procedure Laterality Date   BRONCHOSCOPY  2011   TBBX ----inflammation   IR FLUORO GUIDE CV LINE RIGHT  01/31/2017   IR FLUORO GUIDE CV MIDLINE PICC RIGHT  01/30/2017   IR US GUIDE VASC ACCESS RIGHT  01/30/2017   peritonsilar abscess     TYMPANOPLASTY     VIDEO BRONCHOSCOPY Bilateral 06/25/2018   Procedure: VIDEO BRONCHOSCOPY WITHOUT FLUORO;  Surgeon: Coralyn Helling, MD;  Location: [facility];  Service: Cardiopulmonary;  Laterality: Bilateral;   Patient Active Problem List   Diagnosis Date Noted   Testicular swelling, right 01/08/2023   Radiculitis of left cervical region 12/30/2022   Left lumbar radiculitis 12/30/2022   Osteoporosis 12/30/2022   Long term current use of systemic steroids 05/17/2021   Closed fracture of nasal bones 06/19/2020   Coccygeal pain 06/16/2020   Atherosclerosis of aorta (HCC) 04/12/2019   Pulmonary infiltrates    Medication monitoring encounter 03/03/2018   Pulmonary nodule seen on imaging study 01/15/2018   Microscopic polyangiitis (HCC) 03/14/2016   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   Dyshidrotic eczema 04/08/2014   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 Reports he was sore where he was working on last time but that spot is not hurting as much, notw it hurts just above.   Eval: Patient reports intense neck pain that started 1 and half weeks ago.  Patient reports he was fine on Saturday where he went rollerskating and had no falls or issues but the next day his neck/shoulder/arm started to hurt.  Reports he played the guitar which increased his pain on this left side.  Reports he was given prednisone which did not change his symptoms and was also given gabapentin but has not started that yet.  Patient has a difficult time getting comfortable and pain is just continuous in nature.  He gets slight relief with immobilization but no relief with anything else he has tried at this time.   Pain radiates into left upper arm and at times into forearm.  Prior to this episode patient was having occasional symptoms but they would resolve after a short while.  During 1 of these episode he did have some hand tingling but has not had it since.   Patient also has a current T7 compression fracture  which was found during a CT scan of his chest, reports he does have pain in his lower thoracic (points to around T12) but his neck is his biggest issue at this time. Patient is right-handed but is ambidextrous and some activities.   PERTINENT HISTORY:  Osteoporosis, Wegener's granulomatosis (long term steroid use), psoriatic arthritis, T7 compression fracture   PAIN:  Are you having pain? Yes: NPRS scale: 5/10 Zap burning pain along left lateral thigh   PRECAUTIONS: None   WEIGHT BEARING RESTRICTIONS: No   FALLS:  Has patient fallen in last 6 months? No     OCCUPATION: nuclear physicist   PLOF: Independent   PATIENT GOALS: to have less pain and be able to play guitar       OBJECTIVE:    DIAGNOSTIC FINDINGS: 12/30/22 Xray cervical spine  FINDINGS: There is no evidence of cervical spine fracture or prevertebral soft tissue swelling. Alignment is normal. Mild endplate osteophyte formation is present at C5-C6. No significant neural foraminal stenosis bilaterally. The dens is intact and lateral masses are symmetric.   IMPRESSION: Mild degenerative changes at C5-C6.   Xray Lumbar spine FINDINGS: Alignment is anatomic. Vertebral body and disc space heights are normal. No degenerative changes. No definite pars defects.   IMPRESSION: No findings to explain the patient's pain.   CT chest 2 months ago  Musculoskeletal: Degenerative changes in the spine. Slight compression of the T7 vertebral body, unchanged. No worrisome lytic or sclerotic lesions.     COGNITION: Overall cognitive status: Within functional limits for tasks assessed                                  POSTURE: forward head and flat thoracic spine                   Cervical  AROM:   04/22/23     Flexion  WFL     Extension WFL     R ROT  WFL     L ROT  WFL     R SB WFL    L SB WFL                  (Blank rows =  not tested)                  UE Measurements       Upper Extremity  Right 04/22/23 Left 04/22/23    A/PROM MMT A/PROM MMT  Shoulder Flexion 160 4 160 4  Shoulder Extension       Shoulder Abduction  WFL  4+ WFL  4+   Shoulder Adduction          Shoulder Internal Rotation Reaches to T6 SP   5  Reaches to T6 SP 4+  Shoulder External Rotation Reaches to T2 SP   4+ Reaches to T2 SP    4+  Elbow Flexion          Elbow Extension          Wrist Flexion          Wrist Extension          Wrist Supination          Wrist Pronation          Wrist Ulnar Deviation          Wrist Radial Deviation          Grip Strength NA   NA                          (Blank rows = not tested)                       * pain in lower back (and in shoulders) - hinges at lower T spine with shoulder flexion    ** increased neuropathy        TODAY'S TREATMENT:                                                                                                                              DATE:   06/24/2023  Therapeutic Exercise: Review of HEP  Supine:hip abd with back up against the wall x8 B slow and controlled  Standing:   Neuromuscular Re-education:  Gait Training: observed patient walking and educated in varus load B L>R, increased eversion L>R, - prior demonstration, visual cues with mirror- fast paced more purposeful walking - 15 minutes Self Care: Trigger Point Dry Needling:  Manual therapy: STM and IASTM with metal tool to lateral quad and ITB   PATIENT EDUCATION:  Education details:on HEP, on gait strategies, walking strategies, findings Person educated: Patient Education method: Explanation, Demonstration, and Handouts Education comprehension: verbalized understanding       HOME EXERCISE PROGRAM: 3/19 box breathing/pain relief strategies [id number]  - previous with long exhale breathing     ASSESSMENT:   CLINICAL IMPRESSION: 06/24/2023 Session started with gait analysis and found patient ambulates with increased frontal motions, increased varus load and increased  eversion bilaterally. Educated patient on this and responded well to quicker paced walking with improved gait mechanics. Continued with manual and  added hip strengthening exercise. Fatigue noted as well as soreness end of session.  Eval: Patient presents to physical therapy today with severe neck and left upper arm pain that started about 1.5 weeks ago.  Assessment limited secondary to high levels of pain on this date.  Greatest relief felt with cervical traction which patient was educated on as well as other pain management strategies.  Discussed current pain response and importance of addressing pain  to down regulate central nervous system.  Patient severely limited in range of motion, function, sleep and overall quality of life secondary to pain and physical limitations at this time and would greatly benefit from skilled physical therapy.   OBJECTIVE IMPAIRMENTS: decreased activity tolerance, decreased ROM, decreased strength, improper body mechanics, postural dysfunction, and pain.    ACTIVITY LIMITATIONS: lifting, bending, sitting, sleeping, dressing, reach over head, and locomotion level   PARTICIPATION LIMITATIONS: community activity and occupation, playing guitar   PERSONAL FACTORS: 1-2 comorbidities: osteoporosis, vasculitis,  are also affecting patient's functional outcome.    REHAB POTENTIAL: Good   CLINICAL DECISION MAKING: Evolving/moderate complexity   EVALUATION COMPLEXITY: Moderate     GOALS: Goals reviewed with patient? yes   SHORT TERM GOALS: Target date: 02/04/2023  Patient will be independent in self management strategies to improve quality of life and functional outcomes. Baseline: New Program Goal status: MET   2.  Patient will report at least 50% improvement in overall symptoms and/or function to demonstrate improved functional mobility Baseline: 0% better Goal status: MET   3.  Patient will be able to find comfortable position at night to improve ability to get  comfortable and fall asleep Baseline: Difficult finding comfortable position Goal status:MET   4.  Patient will demonstrate pain-free cervical range of motion in all directions Baseline: Painful Goal status: MET        LONG TERM GOALS: Target date: 06/03/2023    Patient will report at least 75% improvement in overall symptoms and/or function to demonstrate improved functional mobility Baseline: 0% better Goal status: MET   2.  Patient will be able to play guitar without pain or difficulties to return to prior level of function Baseline: Painful/difficult Goal status:  MET   3.  Patient report no radicular symptoms in left upper extremity to improve quality of life. Baseline: Current radicular symptoms into left upper extremity Goal status: MET       4. Patient will report at least 90% improvement in overall symptoms and/or function to demonstrate improved functional mobility   Current: 80% better Goal status: PROGRESSING  5. 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    Current: unable Goal status: MET  PLAN:   PT FREQUENCY: 1-2x/week for a total of 16 visits over 12 week certification period   PT DURATION: 12 weeks   PLANNED INTERVENTIONS: Therapeutic exercises, Therapeutic activity, Neuromuscular re-education, Balance training, Gait training, Patient/Family education, Self Care, Joint mobilization, Joint manipulation, Stair training, Canalith repositioning, Orthotic/Fit training, DME instructions, Aquatic Therapy, Dry Needling, Electrical stimulation, Spinal manipulation, Spinal mobilization, Cryotherapy, Moist heat, Traction, Ionotophoresis 4mg /ml Dexamethasone, Manual therapy, and Re-evaluation.   PLAN FOR NEXT SESSION: LE MMT and ROM - update objective measures-Follow-up with neural stretches to upper extremity, progress breathing exercises     9:32 AM, 06/24/23 Tereasa Coop,  DPT Physical Therapy with [facility]

## 2023-06-27 NOTE — Progress Notes (Signed)
BJ, your total testosterone looks fantastic in the 700s.  The hormone binding is in the normal range as well . The free testosterone which is the "active" form that floats around the bloodstream is just a little borderline low.    We can consider testosterone hormone replacement therapy especially if you are noticing decrease in energy, mood change, decreased muscle mass, falling asleep more easily or decreased libido.  If that something that you are interested in we can look at either topical replacement options or injections.  I am happy to discuss the options in detail.  We can always do a virtual call if that is easier for you, and see what you think would fit best.    Based on your current levels we do not have to do testosterone replacement.  We can opt to just monitor over time and check again in 1 year.  But it can be an option to improve some symptoms that you might be experiencing.  If we replace the testosterone and get your levels more into the normal range and you are not noticing improvement, then we do usually recommend discontinuing the hormone supplementation.  Regular exercise can also help improve testosterone levels so just wanted to mention that 2.

## 2023-07-01 ENCOUNTER — Encounter: Payer: Self-pay | Admitting: Physical Therapy

## 2023-07-01 ENCOUNTER — Ambulatory Visit (INDEPENDENT_AMBULATORY_CARE_PROVIDER_SITE_OTHER): Payer: Commercial Managed Care - PPO | Admitting: Physical Therapy

## 2023-07-01 DIAGNOSIS — M6281 Muscle weakness (generalized): Secondary | ICD-10-CM

## 2023-07-01 DIAGNOSIS — M542 Cervicalgia: Secondary | ICD-10-CM | POA: Diagnosis not present

## 2023-07-01 DIAGNOSIS — M5459 Other low back pain: Secondary | ICD-10-CM

## 2023-07-01 NOTE — Therapy (Signed)
OUTPATIENT PHYSICAL THERAPY TREATMENT NOTE     Patient Name: Jesse HUARD, [name] MRN: [id number] DOB:03/09/1981, 42 y.o., male Today's Date: 07/01/2023  PCP: Agapito Games, MD   REFERRING PROVIDER: Monica Becton, MD   END OF SESSION:   PT End of Session - 07/01/23 1602     Visit Number 22    Number of Visits 29    Date for PT Re-Evaluation 09/02/23    PT Start Time 1602    PT Stop Time 1645    PT Time Calculation (min) 43 min    Activity Tolerance Patient tolerated treatment well    Behavior During Therapy [facility] for tasks assessed/performed             Past Medical History:  Diagnosis Date   Allergy    Arthritis    Asthma    Chronic prostatitis    Myalgia    Nose injury, initial encounter 06/16/2020   Pulmonary infiltrates    Scrotal pain 01/08/2023   Sinusitis, chronic    Tonsillar abscess    Vasculitis (HCC)    Past Surgical History:  Procedure Laterality Date   BRONCHOSCOPY  2011   TBBX ----inflammation   IR FLUORO GUIDE CV LINE RIGHT  01/31/2017   IR FLUORO GUIDE CV MIDLINE PICC RIGHT  01/30/2017   IR US GUIDE VASC ACCESS RIGHT  01/30/2017   peritonsilar abscess     TYMPANOPLASTY     VIDEO BRONCHOSCOPY Bilateral 06/25/2018   Procedure: VIDEO BRONCHOSCOPY WITHOUT FLUORO;  Surgeon: Coralyn Helling, MD;  Location: [facility];  Service: Cardiopulmonary;  Laterality: Bilateral;   Patient Active Problem List   Diagnosis Date Noted   Testicular swelling, right 01/08/2023   Radiculitis of left cervical region 12/30/2022   Left lumbar radiculitis 12/30/2022   Osteoporosis 12/30/2022   Long term current use of systemic steroids 05/17/2021   Closed fracture of nasal bones 06/19/2020   Coccygeal pain 06/16/2020   Atherosclerosis of aorta (HCC) 04/12/2019   Pulmonary infiltrates    Medication monitoring encounter 03/03/2018   Pulmonary nodule seen on imaging study 01/15/2018   Microscopic polyangiitis (HCC) 03/14/2016   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   Dyshidrotic eczema 04/08/2014   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   MYALGIA 06/28/2010   GERD 06/27/2010   Nonspecific (abnormal) findings on radiological and other examination of body structure 05/29/2010   CT, CHEST, ABNORMAL 05/29/2010   Asthma with bronchitis 04/09/2010   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     THERAPY DIAG:  Cervicalgia  Other low back pain  Muscle weakness (generalized)     REFERRING DIAG: M54.16 (ICD-10-CM) - Left lumbar radiculitis   Rationale for Evaluation and Treatment: Rehabilitation     ONSET DATE: 1.5 weeks ago   SUBJECTIVE:  SUBJECTIVE STATEMENT: 07/01/2023 States that his leg was firing up initially but with the purposely walking it helps. States that he still has some pain along T12 but he has been sitting a lot.   Eval: Patient reports intense neck pain that started 1 and half weeks ago.  Patient reports he was fine on Saturday where he went rollerskating and had no falls or issues but the next day his neck/shoulder/arm started to hurt.  Reports he played the guitar which increased his pain on this left side.  Reports he was given prednisone which did not change his symptoms and was also given gabapentin but has not started that yet.  Patient has a difficult time getting comfortable and pain is just continuous in nature.  He gets slight relief with immobilization but no relief with anything else he has tried at this time.   Pain radiates into left upper arm and at times into forearm.  Prior to this episode patient was having occasional symptoms but they would resolve after a short while.  During 1 of these episode he did have some hand tingling but has not had it since.   Patient also has a current T7 compression fracture which  was found during a CT scan of his chest, reports he does have pain in his lower thoracic (points to around T12) but his neck is his biggest issue at this time. Patient is right-handed but is ambidextrous and some activities.   PERTINENT HISTORY:  Osteoporosis, Wegener's granulomatosis (long term steroid use), psoriatic arthritis, T7 compression fracture   PAIN:  Are you having pain? Yes: NPRS scale: 5/10 Zap burning pain along left lateral thigh   PRECAUTIONS: None   WEIGHT BEARING RESTRICTIONS: No   FALLS:  Has patient fallen in last 6 months? No     OCCUPATION: nuclear physicist   PLOF: Independent   PATIENT GOALS: to have less pain and be able to play guitar       OBJECTIVE:    DIAGNOSTIC FINDINGS: 12/30/22 Xray cervical spine  FINDINGS: There is no evidence of cervical spine fracture or prevertebral soft tissue swelling. Alignment is normal. Mild endplate osteophyte formation is present at C5-C6. No significant neural foraminal stenosis bilaterally. The dens is intact and lateral masses are symmetric.   IMPRESSION: Mild degenerative changes at C5-C6.   Xray Lumbar spine FINDINGS: Alignment is anatomic. Vertebral body and disc space heights are normal. No degenerative changes. No definite pars defects.   IMPRESSION: No findings to explain the patient's pain.   CT chest 2 months ago  Musculoskeletal: Degenerative changes in the spine. Slight compression of the T7 vertebral body, unchanged. No worrisome lytic or sclerotic lesions.     COGNITION: Overall cognitive status: Within functional limits for tasks assessed                                  POSTURE: forward head and flat thoracic spine                   Cervical  AROM:   04/22/23     Flexion  WFL     Extension WFL     R ROT  WFL     L ROT  WFL     R SB WFL    L SB WFL                  (Blank rows = not  tested)                  UE Measurements       Upper Extremity Right 04/22/23  Left 04/22/23    A/PROM MMT A/PROM MMT  Shoulder Flexion 160 4 160 4  Shoulder Extension       Shoulder Abduction  WFL  4+ WFL  4+   Shoulder Adduction          Shoulder Internal Rotation Reaches to T6 SP   5  Reaches to T6 SP 4+  Shoulder External Rotation Reaches to T2 SP   4+ Reaches to T2 SP    4+  Elbow Flexion          Elbow Extension          Wrist Flexion          Wrist Extension          Wrist Supination          Wrist Pronation          Wrist Ulnar Deviation          Wrist Radial Deviation          Grip Strength NA   NA                          (Blank rows = not tested)                       * pain in lower back (and in shoulders) - hinges at lower T spine with shoulder flexion    ** increased neuropathy        TODAY'S TREATMENT:                                                                                                                              DATE:   07/01/2023  Therapeutic Exercise: S/l: side plank x3 10" holds B - cues for form/neck position  Review of HEP- 10 minutes   Standing:   Neuromuscular Re-education: tall kneeling with scap protraction and elongation of spine - verbal and tactile cues - 15 minutes, modified cat with spinal elongation and scapular protraction 15  minutes  Gait Training:  Self Care: Trigger Point Dry Needling:  Manual therapy:    PATIENT EDUCATION:  Education details:on HEP, on rationale behind interventions Person educated: Patient Education method: Explanation, Demonstration, and Handouts Education comprehension: verbalized understanding       HOME EXERCISE PROGRAM: 3/19 box breathing/pain relief strategies [id number]  - previous with long exhale breathing     ASSESSMENT:   CLINICAL IMPRESSION: 07/01/2023 Session focused on T12-L1 pain along with spinal elongation.  Tolerated all interventions well with no increase in symptoms but significant tension noted along anterior longitudinal line.  Verbal and tactile cues  throughout session for improved spinal elongation, scapular protraction, and posterior pelvic tilt.  Added all new exercises  to home program.  Fatigue noted end of session but no pain will continue with current plan of care as tolerated.  Eval: Patient presents to physical therapy today with severe neck and left upper arm pain that started about 1.5 weeks ago.  Assessment limited secondary to high levels of pain on this date.  Greatest relief felt with cervical traction which patient was educated on as well as other pain management strategies.  Discussed current pain response and importance of addressing pain  to down regulate central nervous system.  Patient severely limited in range of motion, function, sleep and overall quality of life secondary to pain and physical limitations at this time and would greatly benefit from skilled physical therapy.   OBJECTIVE IMPAIRMENTS: decreased activity tolerance, decreased ROM, decreased strength, improper body mechanics, postural dysfunction, and pain.    ACTIVITY LIMITATIONS: lifting, bending, sitting, sleeping, dressing, reach over head, and locomotion level   PARTICIPATION LIMITATIONS: community activity and occupation, playing guitar   PERSONAL FACTORS: 1-2 comorbidities: osteoporosis, vasculitis,  are also affecting patient's functional outcome.    REHAB POTENTIAL: Good   CLINICAL DECISION MAKING: Evolving/moderate complexity   EVALUATION COMPLEXITY: Moderate     GOALS: Goals reviewed with patient? yes   SHORT TERM GOALS: Target date: 02/04/2023  Patient will be independent in self management strategies to improve quality of life and functional outcomes. Baseline: New Program Goal status: MET   2.  Patient will report at least 50% improvement in overall symptoms and/or function to demonstrate improved functional mobility Baseline: 0% better Goal status: MET   3.  Patient will be able to find comfortable position at night to improve ability  to get comfortable and fall asleep Baseline: Difficult finding comfortable position Goal status:MET   4.  Patient will demonstrate pain-free cervical range of motion in all directions Baseline: Painful Goal status: MET        LONG TERM GOALS: Target date: 06/03/2023    Patient will report at least 75% improvement in overall symptoms and/or function to demonstrate improved functional mobility Baseline: 0% better Goal status: MET   2.  Patient will be able to play guitar without pain or difficulties to return to prior level of function Baseline: Painful/difficult Goal status:  MET   3.  Patient report no radicular symptoms in left upper extremity to improve quality of life. Baseline: Current radicular symptoms into left upper extremity Goal status: MET       4. Patient will report at least 90% improvement in overall symptoms and/or function to demonstrate improved functional mobility   Current: 80% better Goal status: PROGRESSING  5. 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    Current: unable Goal status: MET  PLAN:   PT FREQUENCY: 1-2x/week for a total of 16 visits over 12 week certification period   PT DURATION: 12 weeks   PLANNED INTERVENTIONS: Therapeutic exercises, Therapeutic activity, Neuromuscular re-education, Balance training, Gait training, Patient/Family education, Self Care, Joint mobilization, Joint manipulation, Stair training, Canalith repositioning, Orthotic/Fit training, DME instructions, Aquatic Therapy, Dry Needling, Electrical stimulation, Spinal manipulation, Spinal mobilization, Cryotherapy, Moist heat, Traction, Ionotophoresis 4mg /ml Dexamethasone, Manual therapy, and Re-evaluation.   PLAN FOR NEXT SESSION: LE MMT and ROM - update objective measures-Follow-up with neural stretches to upper extremity, progress breathing exercises     4:54 PM, 07/01/23 Tereasa Coop, DPT Physical Therapy with [facility]

## 2023-07-08 ENCOUNTER — Ambulatory Visit (INDEPENDENT_AMBULATORY_CARE_PROVIDER_SITE_OTHER): Payer: Commercial Managed Care - PPO | Admitting: Physical Therapy

## 2023-07-08 ENCOUNTER — Encounter: Payer: Self-pay | Admitting: Physical Therapy

## 2023-07-08 DIAGNOSIS — M6281 Muscle weakness (generalized): Secondary | ICD-10-CM

## 2023-07-08 DIAGNOSIS — M5459 Other low back pain: Secondary | ICD-10-CM | POA: Diagnosis not present

## 2023-07-08 DIAGNOSIS — M542 Cervicalgia: Secondary | ICD-10-CM | POA: Diagnosis not present

## 2023-07-08 NOTE — Therapy (Signed)
OUTPATIENT PHYSICAL THERAPY TREATMENT NOTE     Patient Name: Jesse ASIA, [name] MRN: [id number] DOB:1981-04-07, 42 y.o., male Today's Date: 07/08/2023  PCP: Agapito Games, MD   REFERRING PROVIDER: Monica Becton, MD   END OF SESSION:   PT End of Session - 07/08/23 1016     Visit Number 23    Number of Visits 29    Date for PT Re-Evaluation 09/02/23    PT Start Time 1017    PT Stop Time 1056    PT Time Calculation (min) 39 min    Activity Tolerance Patient tolerated treatment well    Behavior During Therapy [facility] for tasks assessed/performed             Past Medical History:  Diagnosis Date   Allergy    Arthritis    Asthma    Chronic prostatitis    Myalgia    Nose injury, initial encounter 06/16/2020   Pulmonary infiltrates    Scrotal pain 01/08/2023   Sinusitis, chronic    Tonsillar abscess    Vasculitis (HCC)    Past Surgical History:  Procedure Laterality Date   BRONCHOSCOPY  2011   TBBX ----inflammation   IR FLUORO GUIDE CV LINE RIGHT  01/31/2017   IR FLUORO GUIDE CV MIDLINE PICC RIGHT  01/30/2017   IR US GUIDE VASC ACCESS RIGHT  01/30/2017   peritonsilar abscess     TYMPANOPLASTY     VIDEO BRONCHOSCOPY Bilateral 06/25/2018   Procedure: VIDEO BRONCHOSCOPY WITHOUT FLUORO;  Surgeon: Coralyn Helling, MD;  Location: [facility];  Service: Cardiopulmonary;  Laterality: Bilateral;   Patient Active Problem List   Diagnosis Date Noted   Testicular swelling, right 01/08/2023   Radiculitis of left cervical region 12/30/2022   Left lumbar radiculitis 12/30/2022   Osteoporosis 12/30/2022   Long term current use of systemic steroids 05/17/2021   Closed fracture of nasal bones 06/19/2020   Coccygeal pain 06/16/2020   Atherosclerosis of aorta (HCC) 04/12/2019   Pulmonary infiltrates    Medication monitoring encounter 03/03/2018   Pulmonary nodule seen on imaging study 01/15/2018   Microscopic polyangiitis (HCC) 03/14/2016   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   Dyshidrotic eczema 04/08/2014   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   MYALGIA 06/28/2010   GERD 06/27/2010   Nonspecific (abnormal) findings on radiological and other examination of body structure 05/29/2010   CT, CHEST, ABNORMAL 05/29/2010   Asthma with bronchitis 04/09/2010   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     THERAPY DIAG:  Cervicalgia  Other low back pain  Muscle weakness (generalized)     REFERRING DIAG: M54.16 (ICD-10-CM) - Left lumbar radiculitis   Rationale for Evaluation and Treatment: Rehabilitation     ONSET DATE: 1.5 weeks ago   SUBJECTIVE:  SUBJECTIVE STATEMENT: 07/08/2023 States his IT band feels better, has smaller flare ups but not as intense. Overall pretty good. Occasional pain at insertion/origin. States he is on weak 2 of backing off of his pre-gaba and that is going well   Eval: Patient reports intense neck pain that started 1 and half weeks ago.  Patient reports he was fine on Saturday where he went rollerskating and had no falls or issues but the next day his neck/shoulder/arm started to hurt.  Reports he played the guitar which increased his pain on this left side.  Reports he was given prednisone which did not change his symptoms and was also given gabapentin but has not started that yet.  Patient has a difficult time getting comfortable and pain is just continuous in nature.  He gets slight relief with immobilization but no relief with anything else he has tried at this time.   Pain radiates into left upper arm and at times into forearm.  Prior to this episode patient was having occasional symptoms but they would resolve after a short while.  During 1 of these episode he did have some hand tingling but has not had it since.   Patient  also has a current T7 compression fracture which was found during a CT scan of his chest, reports he does have pain in his lower thoracic (points to around T12) but his neck is his biggest issue at this time. Patient is right-handed but is ambidextrous and some activities.   PERTINENT HISTORY:  Osteoporosis, Wegener's granulomatosis (long term steroid use), psoriatic arthritis, T7 compression fracture   PAIN:  Are you having pain? Yes: NPRS scale: 5/10 Zap burning pain along left lateral thigh   PRECAUTIONS: None   WEIGHT BEARING RESTRICTIONS: No   FALLS:  Has patient fallen in last 6 months? No     OCCUPATION: nuclear physicist   PLOF: Independent   PATIENT GOALS: to have less pain and be able to play guitar       OBJECTIVE:    DIAGNOSTIC FINDINGS: 12/30/22 Xray cervical spine  FINDINGS: There is no evidence of cervical spine fracture or prevertebral soft tissue swelling. Alignment is normal. Mild endplate osteophyte formation is present at C5-C6. No significant neural foraminal stenosis bilaterally. The dens is intact and lateral masses are symmetric.   IMPRESSION: Mild degenerative changes at C5-C6.   Xray Lumbar spine FINDINGS: Alignment is anatomic. Vertebral body and disc space heights are normal. No degenerative changes. No definite pars defects.   IMPRESSION: No findings to explain the patient's pain.   CT chest 2 months ago  Musculoskeletal: Degenerative changes in the spine. Slight compression of the T7 vertebral body, unchanged. No worrisome lytic or sclerotic lesions.     COGNITION: Overall cognitive status: Within functional limits for tasks assessed                                  POSTURE: forward head and flat thoracic spine                   Cervical  AROM:   04/22/23     Flexion  WFL     Extension WFL     R ROT  WFL     L ROT  WFL     R SB WFL    L SB WFL                  (  Blank rows = not tested)                  UE  Measurements       Upper Extremity Right 04/22/23 Left 04/22/23    A/PROM MMT A/PROM MMT  Shoulder Flexion 160 4 160 4  Shoulder Extension       Shoulder Abduction  WFL  4+ WFL  4+   Shoulder Adduction          Shoulder Internal Rotation Reaches to T6 SP   5  Reaches to T6 SP 4+  Shoulder External Rotation Reaches to T2 SP   4+ Reaches to T2 SP    4+  Elbow Flexion          Elbow Extension          Wrist Flexion          Wrist Extension          Wrist Supination          Wrist Pronation          Wrist Ulnar Deviation          Wrist Radial Deviation          Grip Strength NA   NA                          (Blank rows = not tested)                       * pain in lower back (and in shoulders) - hinges at lower T spine with shoulder flexion    ** increased neuropathy        TODAY'S TREATMENT:                                                                                                                              DATE:   07/08/2023  Therapeutic Exercise: S/l: side plank x1 10" holds B - cues for form/neck position, ulnar nerve stretch x5 10" holds B Review of HEP- 5 minutes   Standing:   Neuromuscular Re-education: squats with barrel position in chest - 15 minutes verbal and tactile cues, standing wall with neck ROT and with lower rib decompression - 13 minutes Gait Training:  Self Care: Trigger Point Dry Needling:  Manual therapy:    PATIENT EDUCATION:  Education details:on HEP, on rationale behind interventions Person educated: Patient Education method: Explanation, Demonstration, and Handouts Education comprehension: verbalized understanding       HOME EXERCISE PROGRAM: 3/19 box breathing/pain relief strategies [id number]  - previous with long exhale breathing     ASSESSMENT:   CLINICAL IMPRESSION: 07/08/2023 Session focused on review of exercises and answering all questions. Fatigue noted with squats but improved form. Discussed adding this to HEP. Overall  patient doing very well and overall movement mechanics improving. Will continue with current POC as tolerated.   Eval:  Patient presents to physical therapy today with severe neck and left upper arm pain that started about 1.5 weeks ago.  Assessment limited secondary to high levels of pain on this date.  Greatest relief felt with cervical traction which patient was educated on as well as other pain management strategies.  Discussed current pain response and importance of addressing pain  to down regulate central nervous system.  Patient severely limited in range of motion, function, sleep and overall quality of life secondary to pain and physical limitations at this time and would greatly benefit from skilled physical therapy.   OBJECTIVE IMPAIRMENTS: decreased activity tolerance, decreased ROM, decreased strength, improper body mechanics, postural dysfunction, and pain.    ACTIVITY LIMITATIONS: lifting, bending, sitting, sleeping, dressing, reach over head, and locomotion level   PARTICIPATION LIMITATIONS: community activity and occupation, playing guitar   PERSONAL FACTORS: 1-2 comorbidities: osteoporosis, vasculitis,  are also affecting patient's functional outcome.    REHAB POTENTIAL: Good   CLINICAL DECISION MAKING: Evolving/moderate complexity   EVALUATION COMPLEXITY: Moderate     GOALS: Goals reviewed with patient? yes   SHORT TERM GOALS: Target date: 02/04/2023  Patient will be independent in self management strategies to improve quality of life and functional outcomes. Baseline: New Program Goal status: MET   2.  Patient will report at least 50% improvement in overall symptoms and/or function to demonstrate improved functional mobility Baseline: 0% better Goal status: MET   3.  Patient will be able to find comfortable position at night to improve ability to get comfortable and fall asleep Baseline: Difficult finding comfortable position Goal status:MET   4.  Patient will  demonstrate pain-free cervical range of motion in all directions Baseline: Painful Goal status: MET        LONG TERM GOALS: Target date: 06/03/2023    Patient will report at least 75% improvement in overall symptoms and/or function to demonstrate improved functional mobility Baseline: 0% better Goal status: MET   2.  Patient will be able to play guitar without pain or difficulties to return to prior level of function Baseline: Painful/difficult Goal status:  MET   3.  Patient report no radicular symptoms in left upper extremity to improve quality of life. Baseline: Current radicular symptoms into left upper extremity Goal status: MET       4. Patient will report at least 90% improvement in overall symptoms and/or function to demonstrate improved functional mobility   Current: 80% better Goal status: PROGRESSING  5. 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    Current: unable Goal status: MET  PLAN:   PT FREQUENCY: 1-2x/week for a total of 16 visits over 12 week certification period   PT DURATION: 12 weeks   PLANNED INTERVENTIONS: Therapeutic exercises, Therapeutic activity, Neuromuscular re-education, Balance training, Gait training, Patient/Family education, Self Care, Joint mobilization, Joint manipulation, Stair training, Canalith repositioning, Orthotic/Fit training, DME instructions, Aquatic Therapy, Dry Needling, Electrical stimulation, Spinal manipulation, Spinal mobilization, Cryotherapy, Moist heat, Traction, Ionotophoresis 4mg /ml Dexamethasone, Manual therapy, and Re-evaluation.   PLAN FOR NEXT SESSION: LE MMT and ROM - update objective measures-Follow-up with neural stretches to upper extremity, progress breathing exercises     11:03 AM, 07/08/23 Tereasa Coop, DPT Physical Therapy with [facility]

## 2023-07-12 ENCOUNTER — Other Ambulatory Visit (HOSPITAL_COMMUNITY): Payer: Self-pay

## 2023-07-14 ENCOUNTER — Other Ambulatory Visit (HOSPITAL_COMMUNITY): Payer: Self-pay

## 2023-07-28 ENCOUNTER — Encounter: Payer: Self-pay | Admitting: Physical Therapy

## 2023-07-28 ENCOUNTER — Ambulatory Visit (INDEPENDENT_AMBULATORY_CARE_PROVIDER_SITE_OTHER): Payer: Commercial Managed Care - PPO | Admitting: Physical Therapy

## 2023-07-28 DIAGNOSIS — M5459 Other low back pain: Secondary | ICD-10-CM

## 2023-07-28 DIAGNOSIS — M6281 Muscle weakness (generalized): Secondary | ICD-10-CM | POA: Diagnosis not present

## 2023-07-28 DIAGNOSIS — M542 Cervicalgia: Secondary | ICD-10-CM | POA: Diagnosis not present

## 2023-07-28 NOTE — Therapy (Signed)
OUTPATIENT PHYSICAL THERAPY TREATMENT NOTE     Patient Name: Jesse MATSUYAMA, [name] MRN: [id number] DOB:[birth year], 42 y.o., male Today's Date: 07/28/2023  PCP: Agapito Games, MD   REFERRING PROVIDER: Monica Becton, MD   END OF SESSION:   PT End of Session - 07/28/23 1514     Visit Number 24    Number of Visits 29    Date for PT Re-Evaluation 09/02/23    PT Start Time 1515    PT Stop Time 1555    PT Time Calculation (min) 40 min    Activity Tolerance Patient tolerated treatment well    Behavior During Therapy [facility] for tasks assessed/performed             Past Medical History:  Diagnosis Date   Allergy    Arthritis    Asthma    Chronic prostatitis    Myalgia    Nose injury, initial encounter 06/16/2020   Pulmonary infiltrates    Scrotal pain 01/08/2023   Sinusitis, chronic    Tonsillar abscess    Vasculitis (HCC)    Past Surgical History:  Procedure Laterality Date   BRONCHOSCOPY  2011   TBBX ----inflammation   IR FLUORO GUIDE CV LINE RIGHT  01/31/2017   IR FLUORO GUIDE CV MIDLINE PICC RIGHT  01/30/2017   IR US GUIDE VASC ACCESS RIGHT  01/30/2017   peritonsilar abscess     TYMPANOPLASTY     VIDEO BRONCHOSCOPY Bilateral 06/25/2018   Procedure: VIDEO BRONCHOSCOPY WITHOUT FLUORO;  Surgeon: Coralyn Helling, MD;  Location: [facility];  Service: Cardiopulmonary;  Laterality: Bilateral;   Patient Active Problem List   Diagnosis Date Noted   Testicular swelling, right 01/08/2023   Radiculitis of left cervical region 12/30/2022   Left lumbar radiculitis 12/30/2022   Osteoporosis 12/30/2022   Long term current use of systemic steroids 05/17/2021   Closed fracture of nasal bones 06/19/2020   Coccygeal pain 06/16/2020   Atherosclerosis of aorta (HCC) 04/12/2019   Pulmonary infiltrates    Medication monitoring encounter 03/03/2018   Pulmonary nodule seen on imaging study 01/15/2018   Microscopic polyangiitis (HCC) 03/14/2016   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   Dyshidrotic eczema 04/08/2014   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   MYALGIA 06/28/2010   GERD 06/27/2010   Nonspecific (abnormal) findings on radiological and other examination of body structure 05/29/2010   CT, CHEST, ABNORMAL 05/29/2010   Asthma with bronchitis 04/09/2010   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 States he has bene off the pre-gaba a week and he has had some neuropathy and neck pain but not bad. States he has been watching his posture overall. Reports his ITB hasn't really been bothering him.  Eval: Patient reports intense neck pain that started 1 and half weeks ago.  Patient reports he was fine on Saturday where he went rollerskating and had no falls or issues but the next day his neck/shoulder/arm started to hurt.  Reports he played the guitar which increased his pain on this left side.  Reports he was given prednisone which did not change his symptoms and was also given gabapentin but has not started that yet.  Patient has a difficult time getting comfortable and pain is just continuous in nature.  He gets slight relief with immobilization but no relief with anything else he has tried at this time.   Pain radiates into left upper arm and at times into forearm.  Prior to this episode patient was having occasional symptoms but they would resolve after a short while.  During 1 of these episode he  did have some hand tingling but has not had it since.   Patient also has a current T7 compression fracture which was found during a CT scan of his chest, reports he does have pain in his lower thoracic (points to around T12) but his neck is his biggest issue at this time. Patient is right-handed but is ambidextrous and some activities.   PERTINENT HISTORY:  Osteoporosis, Wegener's granulomatosis (long term steroid use), psoriatic arthritis, T7 compression fracture   PAIN:  Are you having pain? Yes: NPRS scale: 5/10 Zap burning pain along left lateral thigh   PRECAUTIONS: None   WEIGHT BEARING RESTRICTIONS: No   FALLS:  Has patient fallen in last 6 months? No     OCCUPATION: nuclear physicist   PLOF: Independent   PATIENT GOALS: to have less pain and be able to play guitar       OBJECTIVE:    DIAGNOSTIC FINDINGS: 12/30/22 Xray cervical spine  FINDINGS: There is no evidence of cervical spine fracture or prevertebral soft tissue swelling. Alignment is normal. Mild endplate osteophyte formation is present at C5-C6. No significant neural foraminal stenosis bilaterally. The dens is intact and lateral masses are symmetric.   IMPRESSION: Mild degenerative changes at C5-C6.   Xray Lumbar spine FINDINGS: Alignment is anatomic. Vertebral body and disc space heights are normal. No degenerative changes. No definite pars defects.   IMPRESSION: No findings to explain the patient's pain.   CT chest 2 months ago  Musculoskeletal: Degenerative changes in the spine. Slight compression of the T7 vertebral body, unchanged. No worrisome lytic or sclerotic lesions.     COGNITION: Overall cognitive status: Within functional limits for tasks assessed                                  POSTURE: forward head and flat thoracic spine                   Cervical  AROM:   04/22/23     Flexion  WFL     Extension WFL     R ROT  WFL     L ROT  WFL     R SB WFL    L SB WFL                   (  Blank rows = not tested)                  UE Measurements       Upper Extremity Right 04/22/23 Left 04/22/23    A/PROM MMT A/PROM MMT  Shoulder Flexion 160 4 160 4  Shoulder Extension       Shoulder Abduction  WFL  4+ WFL  4+   Shoulder Adduction          Shoulder Internal Rotation Reaches to T6 SP   5  Reaches to T6 SP 4+  Shoulder External Rotation Reaches to T2 SP   4+ Reaches to T2 SP    4+  Elbow Flexion          Elbow Extension          Wrist Flexion          Wrist Extension          Wrist Supination          Wrist Pronation          Wrist Ulnar Deviation          Wrist Radial Deviation          Grip Strength NA   NA                          (Blank rows = not tested)                       * pain in lower back (and in shoulders) - hinges at lower T spine with shoulder flexion    ** increased neuropathy        TODAY'S TREATMENT:                                                                                                                              DATE:   07/28/2023  Therapeutic Exercise: Supine:   Review of HEP- 5 minutes   Standing:   Neuromuscular Re-education: HESTA on foam pad- breathing 5 minutes, supine vacuum  10 minutes, quadruped breathing in field goal position 10 minutes total with breathing, modified gya with flexed thoracic spine 10 minutes with breathing Gait Training:  Self Care: Trigger Point Dry Needling:  Manual therapy:    PATIENT EDUCATION:  Education details:on HEP, on rationale behind interventions Person educated: Patient Education method: Explanation, Demonstration, and Handouts Education comprehension: verbalized understanding       HOME EXERCISE PROGRAM: 3/19 box breathing/pain relief strategies [id number]  - previous with long exhale breathing     ASSESSMENT:   CLINICAL IMPRESSION: 07/28/2023 Session focused on breathing exercises and different positions and progression of vacuum.  Patient with good vacuum but has  limited posterior lower rib expansion.  Focused on quadruped as well as supine position to help with this movement.  Tolerated this well but very difficult for patient to perform deep breath in  modified Gaia position secondary to eliminating ability to breathe anteriorly.  Fatigue noted end of session but no increase in pain.  Reviewed entire home exercise program as well as plan moving forward.  Will continue with current plan of care as tolerated.  Eval: Patient presents to physical therapy today with severe neck and left upper arm pain that started about 1.5 weeks ago.  Assessment limited secondary to high levels of pain on this date.  Greatest relief felt with cervical traction which patient was educated on as well as other pain management strategies.  Discussed current pain response and importance of addressing pain  to down regulate central nervous system.  Patient severely limited in range of motion, function, sleep and overall quality of life secondary to pain and physical limitations at this time and would greatly benefit from skilled physical therapy.   OBJECTIVE IMPAIRMENTS: decreased activity tolerance, decreased ROM, decreased strength, improper body mechanics, postural dysfunction, and pain.    ACTIVITY LIMITATIONS: lifting, bending, sitting, sleeping, dressing, reach over head, and locomotion level   PARTICIPATION LIMITATIONS: community activity and occupation, playing guitar   PERSONAL FACTORS: 1-2 comorbidities: osteoporosis, vasculitis,  are also affecting patient's functional outcome.    REHAB POTENTIAL: Good   CLINICAL DECISION MAKING: Evolving/moderate complexity   EVALUATION COMPLEXITY: Moderate     GOALS: Goals reviewed with patient? yes   SHORT TERM GOALS: Target date: 02/04/2023  Patient will be independent in self management strategies to improve quality of life and functional outcomes. Baseline: New Program Goal status: MET   2.  Patient will report at least 50%  improvement in overall symptoms and/or function to demonstrate improved functional mobility Baseline: 0% better Goal status: MET   3.  Patient will be able to find comfortable position at night to improve ability to get comfortable and fall asleep Baseline: Difficult finding comfortable position Goal status:MET   4.  Patient will demonstrate pain-free cervical range of motion in all directions Baseline: Painful Goal status: MET        LONG TERM GOALS: Target date: 06/03/2023    Patient will report at least 75% improvement in overall symptoms and/or function to demonstrate improved functional mobility Baseline: 0% better Goal status: MET   2.  Patient will be able to play guitar without pain or difficulties to return to prior level of function Baseline: Painful/difficult Goal status:  MET   3.  Patient report no radicular symptoms in left upper extremity to improve quality of life. Baseline: Current radicular symptoms into left upper extremity Goal status: MET       4. Patient will report at least 90% improvement in overall symptoms and/or function to demonstrate improved functional mobility   Current: 80% better Goal status: PROGRESSING  5. 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    Current: unable Goal status: MET  PLAN:   PT FREQUENCY: 1-2x/week for a total of 16 visits over 12 week certification period   PT DURATION: 12 weeks   PLANNED INTERVENTIONS: Therapeutic exercises, Therapeutic activity, Neuromuscular re-education, Balance training, Gait training, Patient/Family education, Self Care, Joint mobilization, Joint manipulation, Stair training, Canalith repositioning, Orthotic/Fit training, DME instructions, Aquatic Therapy, Dry Needling, Electrical stimulation, Spinal manipulation, Spinal mobilization, Cryotherapy, Moist heat, Traction, Ionotophoresis 4mg /ml Dexamethasone,  Manual therapy, and Re-evaluation.   PLAN FOR NEXT SESSION: LE MMT and ROM - update objective measures-Follow-up with neural stretches to upper  extremity, progress breathing exercises     4:49 PM, 07/28/23 Tereasa Coop, DPT Physical Therapy with [facility]

## 2023-07-28 NOTE — Patient Instructions (Addendum)
  Find neutral spine and try to maintain- I find that sitting on a yoga block helps  Think about cervical spinal elongation Arms are pressing into thighs to help assist with spinal elongation  On the inhale- press through into dorsiflexion and down through the hands and lengthen the entire spine - growing from tail bone to head and on the exhale - maintain that height by continuing to press through the heels, arms and head Feet should be in dorsiflexion

## 2023-08-01 ENCOUNTER — Telehealth: Payer: Self-pay

## 2023-08-01 ENCOUNTER — Ambulatory Visit: Payer: Commercial Managed Care - PPO

## 2023-08-01 VITALS — BP 119/73 | HR 96 | Temp 98.2°F | Resp 16 | Ht 79.0 in | Wt 190.4 lb

## 2023-08-01 DIAGNOSIS — M317 Microscopic polyangiitis: Secondary | ICD-10-CM

## 2023-08-01 LAB — CBC WITH DIFFERENTIAL/PLATELET
Basophils Absolute: 0 10*3/uL (ref 0.0–0.1)
Basophils Relative: 0.4 % (ref 0.0–3.0)
Eosinophils Absolute: 0 10*3/uL (ref 0.0–0.7)
Eosinophils Relative: 0 % (ref 0.0–5.0)
HCT: 53.4 % — ABNORMAL HIGH (ref 39.0–52.0)
Hemoglobin: 18 g/dL (ref 13.0–17.0)
Lymphocytes Relative: 4.1 % — ABNORMAL LOW (ref 12.0–46.0)
Lymphs Abs: 0.4 10*3/uL — ABNORMAL LOW (ref 0.7–4.0)
MCHC: 33.8 g/dL (ref 30.0–36.0)
MCV: 93 fL (ref 78.0–100.0)
Monocytes Absolute: 0.2 10*3/uL (ref 0.1–1.0)
Monocytes Relative: 2.2 % — ABNORMAL LOW (ref 3.0–12.0)
Neutro Abs: 8.4 10*3/uL — ABNORMAL HIGH (ref 1.4–7.7)
Neutrophils Relative %: 93.3 % — ABNORMAL HIGH (ref 43.0–77.0)
Platelets: 264 10*3/uL (ref 150.0–400.0)
RBC: 5.74 Mil/uL (ref 4.22–5.81)
RDW: 13.1 % (ref 11.5–15.5)
WBC: 9 10*3/uL (ref 4.0–10.5)

## 2023-08-01 LAB — COMPREHENSIVE METABOLIC PANEL
ALT: 49 U/L (ref 0–53)
AST: 52 U/L — ABNORMAL HIGH (ref 0–37)
Albumin: 4.7 g/dL (ref 3.5–5.2)
Alkaline Phosphatase: 65 U/L (ref 39–117)
BUN: 16 mg/dL (ref 6–23)
CO2: 30 meq/L (ref 19–32)
Calcium: 9.7 mg/dL (ref 8.4–10.5)
Chloride: 102 meq/L (ref 96–112)
Creatinine, Ser: 1.03 mg/dL (ref 0.40–1.50)
GFR: 89.06 mL/min (ref >60.00)
Glucose, Bld: 159 mg/dL — ABNORMAL HIGH (ref 70–99)
Potassium: 3.7 meq/L (ref 3.5–5.1)
Sodium: 140 meq/L (ref 135–145)
Total Bilirubin: 0.7 mg/dL (ref 0.2–1.2)
Total Protein: 7.2 g/dL (ref 6.0–8.3)

## 2023-08-01 LAB — C-REACTIVE PROTEIN: CRP: <1 mg/dL (ref 0.5–20.0)

## 2023-08-01 LAB — SEDIMENTATION RATE: Sed Rate: 2 mm/h (ref 0–15)

## 2023-08-01 MED ORDER — ACETAMINOPHEN 325 MG PO TABS
650.0000 mg | ORAL_TABLET | Freq: Once | ORAL | Status: AC
  Administered 2023-08-01: 650 mg via ORAL

## 2023-08-01 MED ORDER — SODIUM CHLORIDE 0.9 % IV SOLN
1000.0000 mg | Freq: Once | INTRAVENOUS | Status: AC
  Administered 2023-08-01: 1000 mg via INTRAVENOUS
  Filled 2023-08-01: qty 100

## 2023-08-01 MED ORDER — HEPARIN SOD (PORK) LOCK FLUSH 100 UNIT/ML IV SOLN: 500.0000 [IU] | Freq: Once | INTRAVENOUS | Status: DC | PRN

## 2023-08-01 MED ORDER — METHYLPREDNISOLONE SODIUM SUCC 125 MG IJ SOLR
100.0000 mg | Freq: Once | INTRAMUSCULAR | Status: AC
  Administered 2023-08-01: 100 mg via INTRAVENOUS

## 2023-08-01 MED ORDER — ALTEPLASE 2 MG IJ SOLR: 2.0000 mg | Freq: Once | INTRAMUSCULAR | Status: DC | PRN

## 2023-08-01 MED ORDER — DIPHENHYDRAMINE HCL 50 MG/ML IJ SOLN
50.0000 mg | Freq: Once | INTRAMUSCULAR | Status: AC
  Administered 2023-08-01: 50 mg via INTRAVENOUS

## 2023-08-01 MED ORDER — HEPARIN SOD (PORK) LOCK FLUSH 100 UNIT/ML IV SOLN: 250.0000 [IU] | Freq: Once | INTRAVENOUS | Status: DC | PRN

## 2023-08-01 MED ORDER — SODIUM CHLORIDE 0.9% FLUSH: 10.0000 mL | Freq: Once | INTRAVENOUS | Status: DC | PRN

## 2023-08-01 MED ORDER — SODIUM CHLORIDE 0.9% FLUSH: 3.0000 mL | Freq: Once | INTRAVENOUS | Status: DC | PRN

## 2023-08-01 MED ORDER — ANTICOAGULANT SODIUM CITRATE 4% (200MG/5ML) IV SOLN: 5.0000 mL | Freq: Once | Status: DC | PRN

## 2023-08-01 NOTE — Telephone Encounter (Signed)
4:15pm-Received a call from [place] with [employer] [facility]. Patient HGB was a critical high at 18.0  4:16-pm-Tried to call Dr. Alben Deeds (ordering provider with [facility]) Clinic closed. No after hours provider number on the automated system.  4:17pm-Called patient to see if he had any additional contact numbers for doctors office. He denied any alternate numbers for Dr. Dierdre Forth. He states he still sees Dr. Linford Arnold as his PCP. I advised him I would call their office to inform of hemoglobin being out of range. He was aware that was high at last check a few weeks ago. Patient denies any feelings of feeling unwell.   4:20pm-Spoke with Christen Butter, NP with Dr. Shelah Lewandowsky office, informed of results and that I would still send a message to Dr. Dierdre Forth to follow up.

## 2023-08-01 NOTE — Progress Notes (Signed)
Diagnosis: Microscopic polyangiitis   Provider:  Chilton Greathouse MD  Procedure: IV Infusion  IV Type: Peripheral, IV Location: L Antecubital  Truxima (Rituximab-abbs), Dose: 1000 mg  Infusion Start Time: 0924  Infusion Stop Time: 1402  Labs drawn per orders from Dr. Dierdre Forth.  Post Infusion IV Care: Patient declined observation  Discharge: Condition: Good, Destination: Home . AVS Declined  Performed by:  Wyvonne Lenz, RN

## 2023-08-11 ENCOUNTER — Ambulatory Visit (INDEPENDENT_AMBULATORY_CARE_PROVIDER_SITE_OTHER): Payer: Commercial Managed Care - PPO | Admitting: Physical Therapy

## 2023-08-11 ENCOUNTER — Encounter: Payer: Self-pay | Admitting: Physical Therapy

## 2023-08-11 DIAGNOSIS — M542 Cervicalgia: Secondary | ICD-10-CM | POA: Diagnosis not present

## 2023-08-11 DIAGNOSIS — M5459 Other low back pain: Secondary | ICD-10-CM

## 2023-08-11 DIAGNOSIS — M6281 Muscle weakness (generalized): Secondary | ICD-10-CM

## 2023-08-11 NOTE — Therapy (Signed)
OUTPATIENT PHYSICAL THERAPY TREATMENT NOTE     Patient Name: Jesse AUBRY, [name] MRN: [id number] DOB:1981-06-15, 42 y.o., male Today's Date: 08/11/2023  PCP: Agapito Games, MD   REFERRING PROVIDER: Monica Becton, MD   END OF SESSION:   PT End of Session - 08/11/23 1434     Visit Number 25    Number of Visits 29    Date for PT Re-Evaluation 09/02/23    PT Start Time 1434    PT Stop Time 1515    PT Time Calculation (min) 41 min    Activity Tolerance Patient tolerated treatment well    Behavior During Therapy [facility] for tasks assessed/performed             Past Medical History:  Diagnosis Date   Allergy    Arthritis    Asthma    Chronic prostatitis    Myalgia    Nose injury, initial encounter 06/16/2020   Pulmonary infiltrates    Scrotal pain 01/08/2023   Sinusitis, chronic    Tonsillar abscess    Vasculitis (HCC)    Past Surgical History:  Procedure Laterality Date   BRONCHOSCOPY  2011   TBBX ----inflammation   IR FLUORO GUIDE CV LINE RIGHT  01/31/2017   IR FLUORO GUIDE CV MIDLINE PICC RIGHT  01/30/2017   IR US GUIDE VASC ACCESS RIGHT  01/30/2017   peritonsilar abscess     TYMPANOPLASTY     VIDEO BRONCHOSCOPY Bilateral 06/25/2018   Procedure: VIDEO BRONCHOSCOPY WITHOUT FLUORO;  Surgeon: Coralyn Helling, MD;  Location: [facility];  Service: Cardiopulmonary;  Laterality: Bilateral;   Patient Active Problem List   Diagnosis Date Noted   Testicular swelling, right 01/08/2023   Radiculitis of left cervical region 12/30/2022   Left lumbar radiculitis 12/30/2022   Osteoporosis 12/30/2022   Long term current use of systemic steroids 05/17/2021   Closed fracture of nasal bones 06/19/2020   Coccygeal pain 06/16/2020   Atherosclerosis of aorta (HCC) 04/12/2019   Pulmonary infiltrates    Medication monitoring encounter 03/03/2018   Pulmonary nodule seen on imaging study 01/15/2018   Microscopic polyangiitis (HCC) 03/14/2016   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/08/2014   Dyshidrotic eczema 04/08/2014   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   MYALGIA 06/28/2010   GERD 06/27/2010   Nonspecific (abnormal) findings on radiological and other examination of body structure 05/29/2010   CT, CHEST, ABNORMAL 05/29/2010   Asthma with bronchitis 04/09/2010   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 States he does his exercises and it resolves. Leg symptoms are more with proximal standing instead of walking.  Eval: Patient reports intense neck pain that started 1 and half weeks ago.  Patient reports he was fine on Saturday where he went rollerskating and had no falls or issues but the next day his neck/shoulder/arm started to hurt.  Reports he played the guitar which increased his pain on this left side.  Reports he was given prednisone which did not change his symptoms and was also given gabapentin but has not started that yet.  Patient has a difficult time getting comfortable and pain is just continuous in nature.  He gets slight relief with immobilization but no relief with anything else he has tried at this time.   Pain radiates into left upper arm and at times into forearm.  Prior to this episode patient was having occasional symptoms but they would resolve after a short while.  During 1 of these episode he did have some hand tingling but has not had it  since.   Patient also has a current T7 compression fracture which was found during a CT scan of his chest, reports he does have pain in his lower thoracic (points to around T12) but his neck is his biggest issue at this time. Patient is right-handed but is ambidextrous and some activities.   PERTINENT HISTORY:  Osteoporosis, Wegener's granulomatosis (long term steroid use), psoriatic arthritis, T7 compression fracture   PAIN:  Are you having pain? Yes: NPRS scale: 5/10 Zap burning pain along left lateral thigh   PRECAUTIONS: None   WEIGHT BEARING RESTRICTIONS: No   FALLS:  Has patient fallen in last 6 months? No     OCCUPATION: nuclear physicist   PLOF: Independent   PATIENT GOALS: to have less pain and be able to play guitar       OBJECTIVE:    DIAGNOSTIC FINDINGS: 12/30/22 Xray cervical spine  FINDINGS: There is no evidence of cervical spine fracture or prevertebral soft tissue swelling. Alignment is normal. Mild endplate osteophyte formation is present at C5-C6. No significant neural foraminal stenosis bilaterally. The dens is intact and lateral masses are symmetric.   IMPRESSION: Mild degenerative changes at C5-C6.   Xray Lumbar spine FINDINGS: Alignment is anatomic. Vertebral body and disc space heights are normal. No degenerative changes. No definite pars defects.   IMPRESSION: No findings to explain the patient's pain.   CT chest 2 months ago  Musculoskeletal: Degenerative changes in the spine. Slight compression of the T7 vertebral body, unchanged. No worrisome lytic or sclerotic lesions.     COGNITION: Overall cognitive status: Within functional limits for tasks assessed                                  POSTURE: forward head and flat thoracic spine                   Cervical  AROM:   04/22/23     Flexion  WFL     Extension WFL     R ROT  WFL     L ROT  WFL     R SB WFL    L SB WFL                  (  Blank rows = not tested)                   UE Measurements       Upper Extremity Right 04/22/23 Left 04/22/23    A/PROM MMT A/PROM MMT  Shoulder Flexion 160 4 160 4  Shoulder Extension       Shoulder Abduction  WFL  4+ WFL  4+   Shoulder Adduction          Shoulder Internal Rotation Reaches to T6 SP   5  Reaches to T6 SP 4+  Shoulder External Rotation Reaches to T2 SP   4+ Reaches to T2 SP    4+  Elbow Flexion          Elbow Extension          Wrist Flexion          Wrist Extension          Wrist Supination          Wrist Pronation          Wrist Ulnar Deviation          Wrist Radial Deviation          Grip Strength NA   NA                          (Blank rows = not tested)                       * pain in lower back (and in shoulders) - hinges at lower T spine with shoulder flexion    ** increased neuropathy        TODAY'S TREATMENT:                                                                                                                              DATE:   08/11/2023  Therapeutic Exercise: Review of HEP:    Standing: At wall with posterior support review of standing band exercises 5 minutes, at wall with posterior support and towels behind elbows arms at 90 degrees abduction and shoulder internal and external rotation 8 minutes total bilaterally, dead hanging/pull up education and review of form and posture Neuromuscular Re-education:  Gait Training:  Self Care: Trigger Point Dry Needling:  Manual therapy: STM and trigger point release to left infraspinatus and scapular musculature along with upper trap and rhomboids tolerated well   PATIENT EDUCATION:  Education details:on HEP, on rationale behind interventions, on continued range of motion deficits in self mobilization techniques Person educated: Patient Education method: Explanation, Demonstration, and Handouts Education comprehension: verbalized understanding       HOME EXERCISE PROGRAM: 3/19 box breathing/pain relief strategies [id number]   - previous with long exhale breathing     ASSESSMENT:   CLINICAL IMPRESSION: 08/11/2023 Focused on shoulder mobility and strength today as patient continues to obtain endrange motions from  thoracolumbar junction as well as cervical thoracic junction.  Reviewed home exercises and discussed use of wall to improve posture during exercises.  Manual work tolerated well but reproduced neuropathy symptoms with work along left infraspinatus.  Added strengthening exercise for shoulder as well as mobility exercise to continue to work on this.  Overall patient doing well reporting no symptoms just muscle fatigue end of session.  Will continue with current plan of care as tolerated.  Eval: Patient presents to physical therapy today with severe neck and left upper arm pain that started about 1.5 weeks ago.  Assessment limited secondary to high levels of pain on this date.  Greatest relief felt with cervical traction which patient was educated on as well as other pain management strategies.  Discussed current pain response and importance of addressing pain  to down regulate central nervous system.  Patient severely limited in range of motion, function, sleep and overall quality of life secondary to pain and physical limitations at this time and would greatly benefit from skilled physical therapy.   OBJECTIVE IMPAIRMENTS: decreased activity tolerance, decreased ROM, decreased strength, improper body mechanics, postural dysfunction, and pain.    ACTIVITY LIMITATIONS: lifting, bending, sitting, sleeping, dressing, reach over head, and locomotion level   PARTICIPATION LIMITATIONS: community activity and occupation, playing guitar   PERSONAL FACTORS: 1-2 comorbidities: osteoporosis, vasculitis,  are also affecting patient's functional outcome.    REHAB POTENTIAL: Good   CLINICAL DECISION MAKING: Evolving/moderate complexity   EVALUATION COMPLEXITY: Moderate     GOALS: Goals reviewed with patient? yes    SHORT TERM GOALS: Target date: 02/04/2023  Patient will be independent in self management strategies to improve quality of life and functional outcomes. Baseline: New Program Goal status: MET   2.  Patient will report at least 50% improvement in overall symptoms and/or function to demonstrate improved functional mobility Baseline: 0% better Goal status: MET   3.  Patient will be able to find comfortable position at night to improve ability to get comfortable and fall asleep Baseline: Difficult finding comfortable position Goal status:MET   4.  Patient will demonstrate pain-free cervical range of motion in all directions Baseline: Painful Goal status: MET        LONG TERM GOALS: Target date: 06/03/2023    Patient will report at least 75% improvement in overall symptoms and/or function to demonstrate improved functional mobility Baseline: 0% better Goal status: MET   2.  Patient will be able to play guitar without pain or difficulties to return to prior level of function Baseline: Painful/difficult Goal status:  MET   3.  Patient report no radicular symptoms in left upper extremity to improve quality of life. Baseline: Current radicular symptoms into left upper extremity Goal status: MET       4. Patient will report at least 90% improvement in overall symptoms and/or function to demonstrate improved functional mobility   Current: 80% better Goal status: PROGRESSING  5. 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    Current: unable Goal status: MET  PLAN:   PT FREQUENCY: 1-2x/week for a total of 16 visits over 12 week certification period   PT DURATION: 12 weeks   PLANNED INTERVENTIONS: Therapeutic exercises, Therapeutic activity, Neuromuscular re-education, Balance training, Gait training, Patient/Family education, Self Care, Joint mobilization, Joint manipulation, Stair training,  Canalith repositioning, Orthotic/Fit training, DME instructions, Aquatic Therapy, Dry Needling, Electrical stimulation,  Spinal manipulation, Spinal mobilization, Cryotherapy, Moist heat, Traction, Ionotophoresis 4mg /ml Dexamethasone, Manual therapy, and Re-evaluation.   PLAN FOR NEXT SESSION: LE MMT and ROM - update objective measures-Follow-up with neural stretches to upper extremity, progress breathing exercises     3:34 PM, 08/11/23 Tereasa Coop, DPT Physical Therapy with [facility]

## 2023-08-12 ENCOUNTER — Telehealth (INDEPENDENT_AMBULATORY_CARE_PROVIDER_SITE_OTHER): Payer: Commercial Managed Care - PPO | Admitting: Family Medicine

## 2023-08-12 DIAGNOSIS — R7981 Abnormal blood-gas level: Secondary | ICD-10-CM | POA: Diagnosis not present

## 2023-08-12 DIAGNOSIS — D582 Other hemoglobinopathies: Secondary | ICD-10-CM

## 2023-08-12 NOTE — Progress Notes (Unsigned)
Called pt and lvm asking pt to sign into my chart for his appt.

## 2023-08-12 NOTE — Progress Notes (Unsigned)
Virtual Visit via Video Note  I connected with [name], [name] on 08/14/23 at 10:50 AM EDT by a video enabled telemedicine application and verified that I am speaking with the correct person using two identifiers.   I discussed the limitations of evaluation and management by telemedicine and the availability of in person appointments. The patient expressed understanding and agreed to proceed.  Patient location: at home Provider location: in office  Subjective:    CC:  No chief complaint on file.   HPI: F/uy recent labs.      Latest Ref Rng & Units 08/01/2023    2:31 PM 06/04/2023    9:51 AM 10/24/2022    1:37 PM  CBC  WBC 4.0 - 10.5 K/uL 9.0  5.2  5.5   Hemoglobin 13.0 - 17.0 g/dL 16.1 Repeated and verified X2.  17.6  16.6   Hematocrit 39.0 - 52.0 % 53.4  51.0  47.4   Platelets 150.0 - 400.0 K/uL 264.0  238  240    Recent pulse ox 96% 97% today's.  Still struggling with intermittent SOB and chest congestion. Felt well hydrated. Had had IV fluids that day.   Does well with the Rituxin,  really make great improvement in sxs control.     Past medical history, Surgical history, Family history not pertinant except as noted below, Social history, Allergies, and medications have been entered into the medical record, reviewed, and corrections made.    Objective:    General: Speaking clearly in complete sentences without any shortness of breath.  Alert and oriented x3.  Normal judgment. No apparent acute distress.    Impression and Recommendations:    Problem List Items Addressed This Visit   None Visit Diagnoses     Elevated hemoglobin (HCC)    -  Primary   Relevant Medications   AMBULATORY NON FORMULARY MEDICATION   Other Relevant Orders   CBC with Differential/Platelet   Fe+TIBC+Fer   Erythropoietin   JAK2 genotypr   Hemochromatosis DNA-PCR(c282y,h63d)   Carbon Monoxide, Blood   Abnormal blood oxygen level       Relevant Medications   AMBULATORY NON  FORMULARY MEDICATION   Other Relevant Orders   CBC with Differential/Platelet   Fe+TIBC+Fer   Erythropoietin   JAK2 genotypr   Hemochromatosis DNA-PCR(c282y,h63d)   Carbon Monoxide, Blood       Not on any meds that should cause this. Non smoker.  No dehydration.  Normal renal function. Lab Results  Component Value Date   CREATININE 1.03 08/01/2023   Gradual increase in hemoglobin over last 2 years.  Likely from underlying lung disease. Pulse ox ok during day, evaluate overnight. Will order overnight pulse ox.  Consider Polycythemia vera Less likely hemochromatosis.  Carbon monoxide poisoning?  Orders Placed This Encounter  Procedures   CBC with Differential/Platelet   Fe+TIBC+Fer   Erythropoietin   JAK2 genotypr   Hemochromatosis DNA-PCR(c282y,h63d)   Carbon Monoxide, Blood    Meds ordered this encounter  Medications   AMBULATORY NON FORMULARY MEDICATION    Sig: Medication Name: Overnight pulse ox. Please fax to [facility].    Dispense:  1 Units    Refill:  0     I discussed the assessment and treatment plan with the patient. The patient was provided an opportunity to ask questions and all were answered. The patient agreed with the plan and demonstrated an understanding of the instructions.   The patient was advised to call back or seek an in-person  evaluation if the symptoms worsen or if the condition fails to improve as anticipated.   Nani Gasser, MD

## 2023-08-14 ENCOUNTER — Encounter: Payer: Self-pay | Admitting: Family Medicine

## 2023-08-14 MED ORDER — AMBULATORY NON FORMULARY MEDICATION: 0 refills | Status: DC

## 2023-08-15 DIAGNOSIS — R7981 Abnormal blood-gas level: Secondary | ICD-10-CM | POA: Diagnosis not present

## 2023-08-15 DIAGNOSIS — D582 Other hemoglobinopathies: Secondary | ICD-10-CM | POA: Diagnosis not present

## 2023-08-18 NOTE — Progress Notes (Signed)
BJ, I already feel better!  The hemoglobin is back down to 16.7 and looks more normal.  Neutrophil count is also back into the normal range.  Total iron stores look okay.  Erythropoietin is normal as well.  Carbon monoxide test was normal.  Still awaiting the hemochromatosis test and the JAK2 genotype.  But again I already feel better that it is back into your normal range.

## 2023-08-24 NOTE — Progress Notes (Signed)
Negative for gene for polycythemia

## 2023-08-25 LAB — CBC WITH DIFFERENTIAL/PLATELET
Basophils Absolute: 0.1 10*3/uL (ref 0.0–0.2)
Basos: 1 %
EOS (ABSOLUTE): 0.3 10*3/uL (ref 0.0–0.4)
Eos: 5 %
Hematocrit: 50.3 % (ref 37.5–51.0)
Hemoglobin: 16.7 g/dL (ref 13.0–17.7)
Immature Grans (Abs): 0 10*3/uL (ref 0.0–0.1)
Immature Granulocytes: 0 %
Lymphocytes Absolute: 1.3 10*3/uL (ref 0.7–3.1)
Lymphs: 21 %
MCH: 31.1 pg (ref 26.6–33.0)
MCHC: 33.2 g/dL (ref 31.5–35.7)
MCV: 94 fL (ref 79–97)
Monocytes Absolute: 0.5 10*3/uL (ref 0.1–0.9)
Monocytes: 8 %
Neutrophils Absolute: 3.8 10*3/uL (ref 1.4–7.0)
Neutrophils: 65 %
Platelets: 267 10*3/uL (ref 150–450)
RBC: 5.37 x10E6/uL (ref 4.14–5.80)
RDW: 12.7 % (ref 11.6–15.4)
WBC: 5.9 10*3/uL (ref 3.4–10.8)

## 2023-08-25 LAB — HEMOCHROMATOSIS DNA-PCR(C282Y,H63D)

## 2023-08-25 LAB — IRON,TIBC AND FERRITIN PANEL
Ferritin: 119 ng/mL (ref 30–400)
Iron Saturation: 19 % (ref 15–55)
Iron: 52 ug/dL (ref 38–169)
Total Iron Binding Capacity: 276 ug/dL (ref 250–450)
UIBC: 224 ug/dL (ref 111–343)

## 2023-08-25 LAB — ERYTHROPOIETIN: Erythropoietin: 7.3 m[IU]/mL (ref 2.6–18.5)

## 2023-08-25 LAB — CARBON MONOXIDE, BLOOD (PERFORMED AT REF LAB): CARBON MONOXIDE, BLOOD: 2.6 % (ref 0.0–3.6)

## 2023-08-25 LAB — JAK2 GENOTYPR

## 2023-09-01 ENCOUNTER — Encounter: Payer: Self-pay | Admitting: Family Medicine

## 2023-09-01 DIAGNOSIS — R7981 Abnormal blood-gas level: Secondary | ICD-10-CM

## 2023-09-01 NOTE — Progress Notes (Signed)
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  That is good news,  and that this is not associated with an increased risk for developing hereditary hemochromatosis.  I think that was the last test we were waiting for.

## 2023-09-02 ENCOUNTER — Encounter: Payer: Self-pay | Admitting: Physical Therapy

## 2023-09-02 ENCOUNTER — Ambulatory Visit (INDEPENDENT_AMBULATORY_CARE_PROVIDER_SITE_OTHER): Payer: Commercial Managed Care - PPO | Admitting: Physical Therapy

## 2023-09-02 DIAGNOSIS — M6281 Muscle weakness (generalized): Secondary | ICD-10-CM

## 2023-09-02 DIAGNOSIS — M5459 Other low back pain: Secondary | ICD-10-CM | POA: Diagnosis not present

## 2023-09-02 DIAGNOSIS — M542 Cervicalgia: Secondary | ICD-10-CM | POA: Diagnosis not present

## 2023-09-02 NOTE — Therapy (Signed)
OUTPATIENT PHYSICAL THERAPY TREATMENT NOTE      Patient Name: Jesse CARTHAN, [name] MRN: [id number] DOB:[birth year], 42 y.o., male Today's Date: 09/02/2023  PCP: Agapito Games, MD   REFERRING PROVIDER: Monica Becton, MD   END OF SESSION:   PT End of Session - 09/02/23 1600     Visit Number 26    Number of Visits 29    Date for PT Re-Evaluation 09/02/23    PT Start Time 1601    PT Stop Time 1644    PT Time Calculation (min) 43 min    Activity Tolerance Patient tolerated treatment well    Behavior During Therapy [facility] for tasks assessed/performed             Past Medical History:  Diagnosis Date   Allergy    Arthritis    Asthma    Chronic prostatitis    Myalgia    Nose injury, initial encounter 06/16/2020   Pulmonary infiltrates    Scrotal pain 01/08/2023   Sinusitis, chronic    Tonsillar abscess    Vasculitis (HCC)    Past Surgical History:  Procedure Laterality Date   BRONCHOSCOPY  2011   TBBX ----inflammation   IR FLUORO GUIDE CV LINE RIGHT  01/31/2017   IR FLUORO GUIDE CV MIDLINE PICC RIGHT  01/30/2017   IR US GUIDE VASC ACCESS RIGHT  01/30/2017   peritonsilar abscess     TYMPANOPLASTY     VIDEO BRONCHOSCOPY Bilateral 06/25/2018   Procedure: VIDEO BRONCHOSCOPY WITHOUT FLUORO;  Surgeon: Coralyn Helling, MD;  Location: [facility];  Service: Cardiopulmonary;  Laterality: Bilateral;   Patient Active Problem List   Diagnosis Date Noted   Testicular swelling, right 01/08/2023   Radiculitis of left cervical region 12/30/2022   Left lumbar radiculitis 12/30/2022   Osteoporosis 12/30/2022   Long term current use of systemic steroids 05/17/2021   Closed fracture of nasal bones 06/19/2020   Coccygeal pain 06/16/2020   Atherosclerosis of aorta (HCC) 04/12/2019   Pulmonary infiltrates    Medication monitoring encounter 03/03/2018   Pulmonary nodule seen on imaging study 01/15/2018   Microscopic polyangiitis (HCC) 03/14/2016   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/08/2014   Dyshidrotic eczema 04/08/2014   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 Doing well. Still has occasional leg symptoms but is cycling through exercises and feels he is getting better.   Eval: Patient reports intense neck pain that started 1 and half weeks ago.  Patient reports he was fine on Saturday where he went rollerskating and had no falls or issues but the next day his neck/shoulder/arm started to hurt.  Reports he played the guitar which increased his pain on this left side.  Reports he was given prednisone which did not change his symptoms and was also given gabapentin but has not started that yet.  Patient has a difficult time getting comfortable and pain is just continuous in nature.  He gets slight relief with immobilization but no relief with anything else he has tried at this time.   Pain radiates into left upper arm and at times into forearm.  Prior to this episode patient was having occasional symptoms but they would resolve after a short while.  During 1 of these episode he did have some hand tingling but has not had it since.   Patient also has a current T7 compression fracture which was  found during a CT scan of his chest, reports he does have pain in his lower thoracic (points to around T12) but his neck is his biggest issue at this time. Patient is right-handed but is ambidextrous and some activities.   PERTINENT HISTORY:  Osteoporosis, Wegener's granulomatosis (long term steroid use), psoriatic arthritis, T7 compression fracture   PAIN:  Are you having pain? No  PRECAUTIONS: None   WEIGHT BEARING RESTRICTIONS: No   FALLS:  Has patient fallen in last 6 months? No     OCCUPATION: nuclear physicist   PLOF: Independent   PATIENT GOALS: to have less pain and be able to play guitar       OBJECTIVE:    DIAGNOSTIC FINDINGS: 12/30/22 Xray cervical spine  FINDINGS: There is no evidence of cervical spine fracture or prevertebral soft tissue swelling. Alignment is normal. Mild endplate osteophyte formation is present at C5-C6. No significant neural foraminal stenosis bilaterally. The dens is intact and lateral masses are symmetric.   IMPRESSION: Mild degenerative changes at C5-C6.   Xray Lumbar spine FINDINGS: Alignment is anatomic. Vertebral body and disc space heights are normal. No degenerative changes. No definite pars defects.   IMPRESSION: No findings to explain the patient's pain.   CT chest 2 months ago  Musculoskeletal: Degenerative changes in the spine. Slight compression of the T7 vertebral body, unchanged. No worrisome lytic or sclerotic lesions.     COGNITION: Overall cognitive status: Within functional limits for tasks assessed                                  POSTURE: forward head and flat thoracic spine                   Cervical  AROM:   09/02/23     Flexion  WFL     Extension WFL     R ROT  WFL     L ROT  WFL     R SB WFL    L SB WFL                  (Blank rows = not tested)  UE Measurements       Upper Extremity Right 09/02/23 Left 09/02/23    A/PROM MMT A/PROM MMT  Shoulder Flexion 170 4+  170 4+  Shoulder Extension       Shoulder Abduction  WFL  4+ WFL  5  Shoulder Adduction          Shoulder Internal Rotation Reaches to T6 SP   5  Reaches to T4 SP 5  Shoulder External Rotation Reaches to T4 SP   5 Reaches to T4 SP    5  Elbow Flexion          Elbow Extension          Wrist Flexion          Wrist Extension          Wrist Supination          Wrist Pronation          Wrist Ulnar Deviation          Wrist Radial Deviation          Grip Strength NA   NA                          (Blank rows = not tested)                       * pain in lower back (and in shoulders) - hinges at lower T spine with shoulder flexion    ** increased neuropathy        TODAY'S TREATMENT:                                                                                                                              DATE:   09/02/2023  Therapeutic Exercise: Review of HEP:   See education below Neuromuscular Re-education:  Gait Training:  Self Care: Trigger Point Dry Needling:  Manual therapy:   PATIENT EDUCATION:  Education details:on HEP, on on lymphatic drainage and how to stimulate with light jumping on trampoline, on benefits of massage, on benefits of compression socks to help with swelling in lower extremities.  On continued benefits with cycling through exercises regularly, on how to safely return to prior level of function and activities including shoveling maneuver/hay and returning to rollerblading Person educated: Patient Education method: Explanation, Demonstration, and Handouts Education comprehension: verbalized understanding       HOME EXERCISE PROGRAM: 3/19 box breathing/pain relief strategies [id number]  - previous with long exhale breathing     ASSESSMENT:   CLINICAL IMPRESSION: 09/02/2023 All goals met at this time.  Educated patient current presentation as well as plan moving forward.  Discussed use of compression socks to help with fluid retention in legs while  sitting at desk all day.  Discussed strategies to help lymphatic drainage including gentle jumping on trampoline to help stimulate lymph system.  Answered all questions and reviewed  home exercise program.  Will place patient on hold for 4 weeks to transition to previous activities to make sure pain does not return.  If patient is good will formully discharge from physical therapy.  Eval: Patient presents to physical therapy today with severe neck and left upper arm pain that started about 1.5 weeks ago.  Assessment limited secondary to high levels of pain on this date.  Greatest relief felt with cervical traction which patient was educated on as well as other pain management strategies.  Discussed current pain response and importance of addressing pain  to down regulate central nervous system.  Patient severely limited in range of motion, function, sleep and overall quality of life secondary to pain and physical limitations at this time and would greatly benefit from skilled physical therapy.   OBJECTIVE IMPAIRMENTS: decreased activity tolerance, decreased ROM, decreased strength, improper body mechanics, postural dysfunction, and pain.    ACTIVITY LIMITATIONS: lifting, bending, sitting, sleeping, dressing, reach over head, and locomotion level   PARTICIPATION LIMITATIONS: community activity and occupation, playing guitar   PERSONAL FACTORS: 1-2 comorbidities: osteoporosis, vasculitis,  are also affecting patient's functional outcome.    REHAB POTENTIAL: Good   CLINICAL DECISION MAKING: Evolving/moderate complexity   EVALUATION COMPLEXITY: Moderate     GOALS: Goals reviewed with patient? yes   SHORT TERM GOALS: Target date: 02/04/2023  Patient will be independent in self management strategies to improve quality of life and functional outcomes. Baseline: New Program Goal status: MET   2.  Patient will report at least 50% improvement in overall symptoms and/or function to demonstrate improved  functional mobility Baseline: 0% better Goal status: MET   3.  Patient will be able to find comfortable position at night to improve ability to get comfortable and fall asleep Baseline: Difficult finding comfortable position Goal status:MET   4.  Patient will demonstrate pain-free cervical range of motion in all directions Baseline: Painful Goal status: MET        LONG TERM GOALS: Target date: 06/03/2023    Patient will report at least 75% improvement in overall symptoms and/or function to demonstrate improved functional mobility Baseline: 0% better Goal status: MET   2.  Patient will be able to play guitar without pain or difficulties to return to prior level of function Baseline: Painful/difficult Goal status:  MET   3.  Patient report no radicular symptoms in left upper extremity to improve quality of life. Baseline: Current radicular symptoms into left upper extremity Goal status: MET       4. Patient will report at least 90% improvement in overall symptoms and/or function to demonstrate improved functional mobility   Current: 80% better Goal status: MET  5. 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    Current: unable Goal status: MET  PLAN:   PT FREQUENCY: 1-2x/week for a total of 16 visits over 12 week certification period   PT DURATION: 12 weeks   PLANNED INTERVENTIONS: Therapeutic exercises, Therapeutic activity, Neuromuscular re-education, Balance training, Gait training, Patient/Family education, Self Care, Joint mobilization, Joint manipulation, Stair training, Canalith repositioning, Orthotic/Fit training, DME instructions, Aquatic Therapy, Dry Needling, Electrical stimulation, Spinal manipulation, Spinal mobilization, Cryotherapy, Moist heat, Traction, Ionotophoresis 4mg /ml Dexamethasone, Manual therapy, and Re-evaluation.   PLAN FOR NEXT SESSION:4 week hold - will f/u  with pt     4:52 PM, 09/02/23 Tereasa Coop, DPT Physical Therapy with [facility]

## 2023-09-03 ENCOUNTER — Other Ambulatory Visit: Payer: Self-pay | Admitting: Family Medicine

## 2023-09-03 DIAGNOSIS — R7981 Abnormal blood-gas level: Secondary | ICD-10-CM

## 2023-09-03 DIAGNOSIS — D582 Other hemoglobinopathies: Secondary | ICD-10-CM

## 2023-09-03 MED ORDER — AMBULATORY NON FORMULARY MEDICATION: 0 refills | Status: DC

## 2023-09-04 NOTE — Telephone Encounter (Signed)
Please submit order for overnight oxygen study.

## 2023-09-04 NOTE — Telephone Encounter (Signed)
Ordered overnight pulse oximetry.

## 2023-09-10 NOTE — Progress Notes (Signed)
BJ, overall the oxygen study looks really reassuring you primarily stayed in the 90s nothing below that so nothing that would require additional oxygen supplementation.  He technically did have some desaturation events.  This is when your oxygen drops about 3% or more below your baseline.  But again nothing in the worrisome range that would or should be causing any significant deficits.  Interestingly her heart rate did drop to about 44 which was the lowest during sleep.  It is not unusual for heart rate to drop a little lower during sleep, compared to a resting heart rate at night.  This is not too far off the normal range but it may be worthwhile doing an EKG just to make sure there there is no sign of heart block.

## 2023-09-15 ENCOUNTER — Encounter: Payer: Commercial Managed Care - PPO | Admitting: Physical Therapy

## 2023-09-23 ENCOUNTER — Other Ambulatory Visit (HOSPITAL_COMMUNITY): Payer: Self-pay

## 2023-09-23 ENCOUNTER — Other Ambulatory Visit: Payer: Self-pay

## 2023-09-24 ENCOUNTER — Other Ambulatory Visit (HOSPITAL_COMMUNITY): Payer: Self-pay

## 2023-09-24 ENCOUNTER — Other Ambulatory Visit: Payer: Self-pay

## 2023-09-25 ENCOUNTER — Other Ambulatory Visit (HOSPITAL_COMMUNITY): Payer: Self-pay

## 2023-09-30 ENCOUNTER — Other Ambulatory Visit: Payer: Self-pay

## 2023-10-01 ENCOUNTER — Encounter: Payer: Self-pay | Admitting: Physical Therapy

## 2023-10-01 ENCOUNTER — Ambulatory Visit (INDEPENDENT_AMBULATORY_CARE_PROVIDER_SITE_OTHER): Payer: Commercial Managed Care - PPO | Admitting: Physical Therapy

## 2023-10-01 DIAGNOSIS — M542 Cervicalgia: Secondary | ICD-10-CM

## 2023-10-01 DIAGNOSIS — M5459 Other low back pain: Secondary | ICD-10-CM

## 2023-10-01 DIAGNOSIS — M5412 Radiculopathy, cervical region: Secondary | ICD-10-CM

## 2023-10-01 DIAGNOSIS — M6281 Muscle weakness (generalized): Secondary | ICD-10-CM | POA: Diagnosis not present

## 2023-10-01 NOTE — Therapy (Signed)
OUTPATIENT PHYSICAL THERAPY TREATMENT NOTE, RECERT AND RE-EVALUATION     Patient Name: Jesse ENZ, [name] MRN: [id number] DOB:May 08, 1981, 42 y.o., male Today's Date: 10/01/2023  PCP: Agapito Games, MD   REFERRING PROVIDER: Monica Becton, MD   END OF SESSION:   PT End of Session - 10/01/23 1603     Visit Number 27    Number of Visits 43    Date for PT Re-Evaluation 12/24/23    PT Start Time 1604    PT Stop Time 1646    PT Time Calculation (min) 42 min    Activity Tolerance Patient limited by pain    Behavior During Therapy [facility] for tasks assessed/performed              Past Medical History:  Diagnosis Date   Allergy    Arthritis    Asthma    Chronic prostatitis    Myalgia    Nose injury, initial encounter 06/16/2020   Pulmonary infiltrates    Scrotal pain 01/08/2023   Sinusitis, chronic    Tonsillar abscess    Vasculitis (HCC)    Past Surgical History:  Procedure Laterality Date   BRONCHOSCOPY  2011   TBBX ----inflammation   IR FLUORO GUIDE CV LINE RIGHT  01/31/2017   IR FLUORO GUIDE CV MIDLINE PICC RIGHT  01/30/2017   IR US GUIDE VASC ACCESS RIGHT  01/30/2017   peritonsilar abscess     TYMPANOPLASTY     VIDEO BRONCHOSCOPY Bilateral 06/25/2018   Procedure: VIDEO BRONCHOSCOPY WITHOUT FLUORO;  Surgeon: Coralyn Helling, MD;  Location: [facility];  Service: Cardiopulmonary;  Laterality: Bilateral;   Patient Active Problem List   Diagnosis Date Noted   Testicular swelling, right 01/08/2023   Radiculitis of left cervical region 12/30/2022   Left lumbar radiculitis 12/30/2022   Osteoporosis 12/30/2022   Long term current use of systemic steroids 05/17/2021   Closed fracture of nasal bones 06/19/2020   Coccygeal pain 06/16/2020   Atherosclerosis of aorta (HCC) 04/12/2019   Pulmonary infiltrates    Medication monitoring encounter 03/03/2018   Pulmonary nodule seen on imaging study 01/15/2018   Microscopic polyangiitis (HCC) 03/14/2016    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   Dyshidrotic eczema 04/08/2014   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   MYALGIA 06/28/2010   GERD 06/27/2010   Nonspecific (abnormal) findings on radiological and other examination of body structure 05/29/2010   CT, CHEST, ABNORMAL 05/29/2010   Asthma with bronchitis 04/09/2010   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  Reports this pain was more muscular in nature but then resolved after the holiday.  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  Reports that he knows a lot but started to have pain on Monday of this week.  Reports that he really tried to stretch everything pain just seem to get worse.  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  Reports he cannot find any position that is comfortable.  He has tried a lot of his exercises.   Eval: Patient reports intense neck pain that started 1 and half weeks ago.  Patient reports he was fine on Saturday where he went rollerskating and had no falls or issues but the next day his neck/shoulder/arm started to hurt.  Reports he played the guitar which increased his pain on this left side.  Reports he was given prednisone which did not change his symptoms and was also given gabapentin but has not started  that yet.  Patient has a difficult time getting comfortable and pain is just continuous in nature.  He gets slight relief with immobilization but no relief with anything else he has tried at this time.   Pain radiates into left upper arm and at times into forearm.  Prior to this episode patient was having occasional symptoms but they would resolve after a short while.  During 1 of these episode he did have some hand tingling but has not had it since.   Patient also has a current T7 compression fracture which was found during a CT scan of his chest, reports he does have pain in his lower thoracic (points to around T12) but his neck is his biggest issue at this time. Patient is right-handed but is ambidextrous and some activities.   PERTINENT HISTORY:  Osteoporosis, Wegener's granulomatosis (long term steroid use), psoriatic arthritis, T7 compression fracture   PAIN:  Are you having pain? 6/10 in the neck radiating down left shoulder blade and into her arm Aching, sharp, grabbing  PRECAUTIONS: None   WEIGHT BEARING RESTRICTIONS: No   FALLS:  Has patient fallen in last 6 months? No     OCCUPATION: nuclear physicist   PLOF: Independent   PATIENT GOALS: to have less pain and be able to play guitar       OBJECTIVE:    DIAGNOSTIC FINDINGS: 12/30/22 Xray cervical spine  FINDINGS: There is no evidence of cervical spine fracture or prevertebral soft tissue swelling. Alignment is normal. Mild endplate osteophyte formation is present at C5-C6. No significant neural foraminal stenosis bilaterally. The dens is intact and lateral masses are symmetric.   IMPRESSION: Mild degenerative changes at C5-C6.   Xray Lumbar spine FINDINGS: Alignment is anatomic. Vertebral body and disc space heights are normal. No degenerative changes. No definite pars defects.   IMPRESSION: No findings to explain the patient's pain.   CT chest 2 months ago  Musculoskeletal: Degenerative changes in the  spine. Slight compression of the T7 vertebral body, unchanged. No worrisome lytic or sclerotic lesions.     COGNITION: Overall cognitive status: Within functional limits for tasks assessed  POSTURE: forward head and flat thoracic spine                   Cervical  AROM:   09/02/23  10/01/23   Flexion  WFL  WFL   Extension WFL Limited and painful   R ROT  WFL Feels better than left rotation   L ROT  WFL Limited and painful   R SB WFL Limited and painful  L SB WFL Limited and painful                (Blank rows = not tested)                  UE Measurements 10/01/23 -not formally measured or updated on this date secondary to severe levels of pain           Upper Extremity Right 09/02/23 Left 09/02/23 Right   Left      A/PROM MMT A/PROM MMT AROM MMT AROM MMT  Shoulder Flexion 170 4+ 170 4+      Shoulder Extension           Shoulder Abduction  WFL  4+ WFL  5      Shoulder Adduction              Shoulder Internal Rotation Reaches to T6 SP   5  Reaches to T4 SP 5      Shoulder External Rotation Reaches to T4 SP   5 Reaches to T4 SP    5      Elbow Flexion              Elbow Extension              Wrist Flexion              Wrist Extension              Wrist Supination              Wrist Pronation              Wrist Ulnar Deviation              Wrist Radial Deviation              Grip Strength NA   NA                              (Blank rows = not tested)                       * pain in lower back (and in shoulders) - hinges at lower T spine with shoulder flexion    ** increased neuropathy        TODAY'S TREATMENT:  DATE:   10/01/2023  Therapeutic Exercise: Review of HEP Objective measures updated  Prone : laying over 2 pillows for gentle traction of cervical time with right cervical rotation-tolerated  well See education below Seated: Passive range of motion of shoulder blades into retraction and depression-tolerated well rest to active range of motion Neuromuscular Re-education:  Gait Training:  Self Care: Trigger Point Dry Needling:  Manual therapy: Strain counterstrain tolerated best along levator and upper trap as well as infraspinatus on left side 10 minutes, PAs to thoracic spine grade II, STM to cervical paraspinals/thoracic paraspinals/rhomboids/upper traps/levator, gentle cervical traction in prone position   PATIENT EDUCATION:  Education details:on HEP, on current presentation, plan moving forward, difference between joint issue and muscular issue as well as what to do with a muscular issue.  Discussed not overstretching and performing shortened positions as well as reduced stretching with breath work Person educated: Patient Education method: Programmer, multimedia, Facilities manager, and Handouts Education comprehension: verbalized understanding       HOME EXERCISE PROGRAM: 3/19 box breathing/pain relief strategies [id number]  - previous with long exhale breathing     ASSESSMENT:   CLINICAL IMPRESSION: 10/01/2023 Patient was doing very well at the time of patient's last visit where he was placed on a hold to determine tolerance to return to PLOF and reduced PT visits. 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 Patient presents with slightly different symptoms on this date more suggestive of muscular spasm.  Patient tolerated strain counterstrain and scapular traction best on this date.  Symptoms are greatly limiting his ability to perform required daily tasks including but not limited to: dressing, bathing, computer work, lifting and managing farm as well as recreational activities like playing guitar and drums. Patient has secondary co-morbidities likely contributing to current condition  including osteoporosis and vasculitis where he receives regular chemo infusions. Patient would greatly benefit from skilled PT to improve overall function and quality of life, extending current certification to work towards these goals.   Eval: Patient presents to physical therapy today with severe neck and left upper arm pain that started about 1.5 weeks ago.  Assessment limited secondary to high levels of pain on this date.  Greatest relief felt with cervical traction which patient was educated on as well as other pain management strategies.  Discussed current pain response and importance of addressing pain  to down regulate central nervous system.  Patient severely limited in range of motion, function, sleep and overall quality of life secondary to pain and physical limitations at this time and would greatly benefit from skilled physical therapy.   OBJECTIVE IMPAIRMENTS: decreased activity tolerance, decreased ROM, decreased strength, improper body mechanics, postural dysfunction, and pain.    ACTIVITY LIMITATIONS: lifting, bending, sitting, sleeping, dressing, reach over head, and locomotion level   PARTICIPATION LIMITATIONS: community activity and occupation, playing guitar   PERSONAL FACTORS: 1-2 comorbidities: osteoporosis, vasculitis,  are also affecting patient's functional outcome.    REHAB POTENTIAL: Good   CLINICAL DECISION MAKING: Evolving/moderate complexity   EVALUATION COMPLEXITY: Moderate     GOALS: Goals reviewed with patient? yes   SHORT TERM GOALS: Target date: 11/26/23 Patient will be independent in self management strategies to improve quality of life and functional outcomes. Baseline: New Program Goal status: MET -previously MET on 09/11/23   2.  Patient will report at least 50% improvement in overall symptoms and/or function to demonstrate improved  functional mobility Baseline: 0% better Goal status: MET -previously MET on 09/11/23   3.  Patient will be able to  find comfortable position at night to improve ability to get comfortable and fall asleep Baseline: Difficult finding comfortable position Goal status:MET-previously MET on 09/11/23   4.  Patient will demonstrate pain-free cervical range of motion in all directions Baseline: Painful Goal status: MET -previously MET on 09/11/23   5. Patient will demonstrate pain-free cervical range of motion   Current: Painful and unable Goal status: NEW  6. Patient will patient will be able to sit at work without severe pain to be able to do job responsibilities   Current: Unable very painful Goal status: NEW  7. Patient will be able to find relief throughout the day different positions or postures   Current: Unable Goal status: NEW     LONG TERM GOALS: Target date:12/24/23   Patient will report at least 75% improvement in overall symptoms and/or function to demonstrate improved functional mobility Baseline: 0% better Goal status: MET -previously MET on 09/11/23   2.  Patient will be able to play guitar without pain or difficulties to return to prior level of function Baseline: Painful/difficult Goal status:  MET -previously MET on 09/11/23   3.  Patient report no radicular symptoms in left upper extremity to improve quality of life. Baseline: Current radicular symptoms into left upper extremity Goal status: MET -previously MET on 09/11/23      4. Patient will report at least 90% improvement in overall symptoms and/or function to demonstrate improved functional mobility   Current: 80% better Goal status: MET -previously MET on 09/11/23  5. 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    Current: unable Goal status: MET  -previously MET on 09/11/23  6. Patient will patient will be able to play drums and guitar without pain or neuropathy in left upper extremity   Current: Unable very painful Goal status:  NEW  7. Patient will report at least 75% improvement overall symptoms since symptoms returned   Current: 0% Goal status: NEW  8. Patient will be able to perform tasks around farm limitations or severe pain to return to prior level of function   Current: Unable Goal status: NEW  PLAN:   PT FREQUENCY: 1-2x/week for a total of 16 visits over 12 week certification period   PT DURATION: 12 weeks   PLANNED INTERVENTIONS: Therapeutic exercises, Therapeutic activity, Neuromuscular re-education, Balance training, Gait training, Patient/Family education, Self Care, Joint mobilization, Joint manipulation, Stair training, Canalith repositioning, Orthotic/Fit training, DME instructions, Aquatic Therapy, Dry Needling, Electrical stimulation, Spinal manipulation, Spinal mobilization, Cryotherapy, Moist heat, Traction, Ionotophoresis 4mg /ml Dexamethasone, Manual therapy, and Re-evaluation.   PLAN FOR NEXT SESSION: Strain counterstrain, scapular traction, treat like muscle spasm along left scapular region     6:24 PM, 10/01/23 Tereasa Coop, DPT Physical Therapy with [facility]

## 2023-10-06 ENCOUNTER — Encounter: Payer: Self-pay | Admitting: Physical Therapy

## 2023-10-06 ENCOUNTER — Other Ambulatory Visit (HOSPITAL_COMMUNITY): Payer: Self-pay

## 2023-10-06 ENCOUNTER — Ambulatory Visit (INDEPENDENT_AMBULATORY_CARE_PROVIDER_SITE_OTHER): Payer: Commercial Managed Care - PPO | Admitting: Physical Therapy

## 2023-10-06 DIAGNOSIS — M6281 Muscle weakness (generalized): Secondary | ICD-10-CM | POA: Diagnosis not present

## 2023-10-06 DIAGNOSIS — M5412 Radiculopathy, cervical region: Secondary | ICD-10-CM | POA: Diagnosis not present

## 2023-10-06 DIAGNOSIS — M5459 Other low back pain: Secondary | ICD-10-CM

## 2023-10-06 DIAGNOSIS — M542 Cervicalgia: Secondary | ICD-10-CM | POA: Diagnosis not present

## 2023-10-06 NOTE — Therapy (Signed)
OUTPATIENT PHYSICAL THERAPY TREATMENT NOTE      Patient Name: Jesse KLICK, [name] MRN: [id number] DOB:05/29/1981, 42 y.o., male Today's Date: 10/06/2023  PCP: Agapito Games, MD   REFERRING PROVIDER: Monica Becton, MD   END OF SESSION:   PT End of Session - 10/06/23 1425     Visit Number 28    Number of Visits 43    Date for PT Re-Evaluation 12/24/23    PT Start Time 1427    PT Stop Time 1510    PT Time Calculation (min) 43 min    Activity Tolerance Patient limited by pain    Behavior During Therapy [facility] for tasks assessed/performed              Past Medical History:  Diagnosis Date   Allergy    Arthritis    Asthma    Chronic prostatitis    Myalgia    Nose injury, initial encounter 06/16/2020   Pulmonary infiltrates    Scrotal pain 01/08/2023   Sinusitis, chronic    Tonsillar abscess    Vasculitis (HCC)    Past Surgical History:  Procedure Laterality Date   BRONCHOSCOPY  2011   TBBX ----inflammation   IR FLUORO GUIDE CV LINE RIGHT  01/31/2017   IR FLUORO GUIDE CV MIDLINE PICC RIGHT  01/30/2017   IR US GUIDE VASC ACCESS RIGHT  01/30/2017   peritonsilar abscess     TYMPANOPLASTY     VIDEO BRONCHOSCOPY Bilateral 06/25/2018   Procedure: VIDEO BRONCHOSCOPY WITHOUT FLUORO;  Surgeon: Coralyn Helling, MD;  Location: [facility];  Service: Cardiopulmonary;  Laterality: Bilateral;   Patient Active Problem List   Diagnosis Date Noted   Testicular swelling, right 01/08/2023   Radiculitis of left cervical region 12/30/2022   Left lumbar radiculitis 12/30/2022   Osteoporosis 12/30/2022   Long term current use of systemic steroids 05/17/2021   Closed fracture of nasal bones 06/19/2020   Coccygeal pain 06/16/2020   Atherosclerosis of aorta (HCC) 04/12/2019   Pulmonary infiltrates    Medication monitoring encounter 03/03/2018   Pulmonary nodule seen on imaging study 01/15/2018   Microscopic polyangiitis (HCC) 03/14/2016   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   Dyshidrotic eczema 04/08/2014   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   MYALGIA 06/28/2010   GERD 06/27/2010   Nonspecific (abnormal) findings on radiological and other examination of body structure 05/29/2010   CT, CHEST, ABNORMAL 05/29/2010   Asthma with bronchitis 04/09/2010   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     THERAPY DIAG:  Cervicalgia  Other low back pain  Muscle weakness (generalized)  Left cervical radiculopathy     REFERRING DIAG:  Left lumbar radiculitis   Rationale for Evaluation and Treatment: Rehabilitation     ONSET DATE: over the weekend   SUBJECTIVE:  SUBJECTIVE STATEMENT: 10/06/2023 States he has been using the theracane and has upped his pre- gaba (doubled) -and that seems to help States he is feeling a little better today.    Eval: Patient reports intense neck pain that started 1 and half weeks ago.  Patient reports he was fine on Saturday where he went rollerskating and had no falls or issues but the next day his neck/shoulder/arm started to hurt.  Reports he played the guitar which increased his pain on this left side.  Reports he was given prednisone which did not change his symptoms and was also given gabapentin but has not started that yet.  Patient has a difficult time getting comfortable and pain is just continuous in nature.  He gets slight relief with immobilization but no relief with anything else he has tried at this time.   Pain radiates into left upper arm and at times into forearm.  Prior to this episode patient was having occasional symptoms but they would resolve after a short while.  During 1 of these episode he did have some hand tingling but has not had it since.   Patient also has a current T7 compression fracture which  was found during a CT scan of his chest, reports he does have pain in his lower thoracic (points to around T12) but his neck is his biggest issue at this time. Patient is right-handed but is ambidextrous and some activities.   PERTINENT HISTORY:  Osteoporosis, Wegener's granulomatosis (long term steroid use), psoriatic arthritis, T7 compression fracture   PAIN:  Are you having pain? 4/10 in the neck radiating down left shoulder blade and into her arm Aching, sharp, grabbing  PRECAUTIONS: None   WEIGHT BEARING RESTRICTIONS: No   FALLS:  Has patient fallen in last 6 months? No     OCCUPATION: nuclear physicist   PLOF: Independent   PATIENT GOALS: to have less pain and be able to play guitar       OBJECTIVE:    DIAGNOSTIC FINDINGS: 12/30/22 Xray cervical spine  FINDINGS: There is no evidence of cervical spine fracture or prevertebral soft tissue swelling. Alignment is normal. Mild endplate osteophyte formation is present at C5-C6. No significant neural foraminal stenosis bilaterally. The dens is intact and lateral masses are symmetric.   IMPRESSION: Mild degenerative changes at C5-C6.   Xray Lumbar spine FINDINGS: Alignment is anatomic. Vertebral body and disc space heights are normal. No degenerative changes. No definite pars defects.   IMPRESSION: No findings to explain the patient's pain.   CT chest 2 months ago  Musculoskeletal: Degenerative changes in the spine. Slight compression of the T7 vertebral body, unchanged. No worrisome lytic or sclerotic lesions.     COGNITION: Overall cognitive status: Within functional limits for tasks assessed                                  POSTURE: forward head and flat thoracic spine                   Cervical  AROM:   09/02/23  10/01/23   Flexion  WFL  WFL   Extension WFL Limited and painful   R ROT  WFL Feels better than left rotation   L ROT  WFL Limited and painful   R SB WFL Limited and painful  L SB  WFL Limited and painful                (  Blank rows = not tested)                  UE Measurements 10/01/23 -not formally measured or updated on this date secondary to severe levels of pain           Upper Extremity Right 09/02/23 Left 09/02/23 Right   Left      A/PROM MMT A/PROM MMT AROM MMT AROM MMT  Shoulder Flexion 170 4+ 170 4+      Shoulder Extension           Shoulder Abduction  WFL  4+ WFL  5      Shoulder Adduction              Shoulder Internal Rotation Reaches to T6 SP   5  Reaches to T4 SP 5      Shoulder External Rotation Reaches to T4 SP   5 Reaches to T4 SP    5      Elbow Flexion              Elbow Extension              Wrist Flexion              Wrist Extension              Wrist Supination              Wrist Pronation              Wrist Ulnar Deviation              Wrist Radial Deviation              Grip Strength NA   NA                              (Blank rows = not tested)                       * pain in lower back (and in shoulders) - hinges at lower T spine with shoulder flexion    ** increased neuropathy        TODAY'S TREATMENT:                                                                                                                              DATE:   10/06/2023  Therapeutic Exercise: Quad scap protraction/retraction with head flexed and neutral - PT assist  15 minutes  Supine: towel roll down spine with breath work - 15 minutes- rocking back and forth with hug position over ball, static hold, scapular protraction and retraction     Seated:   Neuromuscular Re-education:  Gait Training:  Self Care: Trigger Point Dry Needling:  Manual therapy: STM with rhomboids on stretch and on slack - seated - tolerated well - 10 minutes  PATIENT EDUCATION:  Education details:on HEP Person educated: Patient Education method: Explanation, Demonstration, and Handouts Education comprehension: verbalized understanding       HOME EXERCISE  PROGRAM: 3/19 box breathing/pain relief strategies [id number]  - previous with long exhale breathing     ASSESSMENT:   CLINICAL IMPRESSION: 10/06/2023 Tolerated quadruped position and supine with towel roll down back well. Added to HEP and instructed patient to perform gently and not to over stretch.  Continued with gentle manual and this was also tolerated well. 2/10 pain noted end of session. Will continue with current POC as tolerated.   Eval: Patient presents to physical therapy today with severe neck and left upper arm pain that started about 1.5 weeks ago.  Assessment limited secondary to high levels of pain on this date.  Greatest relief felt with cervical traction which patient was educated on as well as other pain management strategies.  Discussed current pain response and importance of addressing pain  to down regulate central nervous system.  Patient severely limited in range of motion, function, sleep and overall quality of life secondary to pain and physical limitations at this time and would greatly benefit from skilled physical therapy.   OBJECTIVE IMPAIRMENTS: decreased activity tolerance, decreased ROM, decreased strength, improper body mechanics, postural dysfunction, and pain.    ACTIVITY LIMITATIONS: lifting, bending, sitting, sleeping, dressing, reach over head, and locomotion level   PARTICIPATION LIMITATIONS: community activity and occupation, playing guitar   PERSONAL FACTORS: 1-2 comorbidities: osteoporosis, vasculitis,  are also affecting patient's functional outcome.    REHAB POTENTIAL: Good   CLINICAL DECISION MAKING: Evolving/moderate complexity   EVALUATION COMPLEXITY: Moderate     GOALS: Goals reviewed with patient? yes   SHORT TERM GOALS: Target date: 11/26/23 Patient will be independent in self management strategies to improve quality of life and functional outcomes. Baseline: New Program Goal status: MET -previously MET on 09/11/23   2.  Patient will  report at least 50% improvement in overall symptoms and/or function to demonstrate improved functional mobility Baseline: 0% better Goal status: MET -previously MET on 09/11/23   3.  Patient will be able to find comfortable position at night to improve ability to get comfortable and fall asleep Baseline: Difficult finding comfortable position Goal status:MET-previously MET on 09/11/23   4.  Patient will demonstrate pain-free cervical range of motion in all directions Baseline: Painful Goal status: MET -previously MET on 09/11/23   5. Patient will demonstrate pain-free cervical range of motion   Current: Painful and unable Goal status: NEW  6. Patient will patient will be able to sit at work without severe pain to be able to do job responsibilities   Current: Unable very painful Goal status: NEW  7. Patient will be able to find relief throughout the day different positions or postures   Current: Unable Goal status: NEW     LONG TERM GOALS: Target date:12/24/23   Patient will report at least 75% improvement in overall symptoms and/or function to demonstrate improved functional mobility Baseline: 0% better Goal status: MET -previously MET on 09/11/23   2.  Patient will be able to play guitar without pain or difficulties to return to prior level of function Baseline: Painful/difficult Goal status:  MET -previously MET on 09/11/23   3.  Patient report no radicular symptoms in left upper extremity to improve quality of life. Baseline: Current radicular symptoms into left upper extremity Goal status: MET -previously MET on 09/11/23      4. Patient will report at least 90%  improvement in overall symptoms and/or function to demonstrate improved functional mobility   Current: 80% better Goal status: MET -previously MET on 09/11/23  5. 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    Current:  unable Goal status: MET  -previously MET on 09/11/23  6. Patient will patient will be able to play drums and guitar without pain or neuropathy in left upper extremity   Current: Unable very painful Goal status: NEW  7. Patient will report at least 75% improvement overall symptoms since symptoms returned   Current: 0% Goal status: NEW  8. Patient will be able to perform tasks around farm limitations or severe pain to return to prior level of function   Current: Unable Goal status: NEW  PLAN:   PT FREQUENCY: 1-2x/week for a total of 16 visits over 12 week certification period   PT DURATION: 12 weeks   PLANNED INTERVENTIONS: Therapeutic exercises, Therapeutic activity, Neuromuscular re-education, Balance training, Gait training, Patient/Family education, Self Care, Joint mobilization, Joint manipulation, Stair training, Canalith repositioning, Orthotic/Fit training, DME instructions, Aquatic Therapy, Dry Needling, Electrical stimulation, Spinal manipulation, Spinal mobilization, Cryotherapy, Moist heat, Traction, Ionotophoresis 4mg /ml Dexamethasone, Manual therapy, and Re-evaluation.   PLAN FOR NEXT SESSION: Strain counterstrain, scapular traction, treat like muscle spasm along left scapular region     3:17 PM, 10/06/23 Tereasa Coop, DPT Physical Therapy with [facility]

## 2023-10-09 ENCOUNTER — Encounter: Payer: Self-pay | Admitting: Physical Therapy

## 2023-10-09 ENCOUNTER — Ambulatory Visit (INDEPENDENT_AMBULATORY_CARE_PROVIDER_SITE_OTHER): Payer: Commercial Managed Care - PPO | Admitting: Physical Therapy

## 2023-10-09 DIAGNOSIS — M5412 Radiculopathy, cervical region: Secondary | ICD-10-CM | POA: Diagnosis not present

## 2023-10-09 DIAGNOSIS — M542 Cervicalgia: Secondary | ICD-10-CM

## 2023-10-09 DIAGNOSIS — M5459 Other low back pain: Secondary | ICD-10-CM | POA: Diagnosis not present

## 2023-10-09 DIAGNOSIS — M6281 Muscle weakness (generalized): Secondary | ICD-10-CM

## 2023-10-09 NOTE — Therapy (Signed)
OUTPATIENT PHYSICAL THERAPY TREATMENT NOTE      Patient Name: Jesse WOLTER, [name] MRN: [id number] DOB:[birth year], 42 y.o., male Today's Date: 10/09/2023  PCP: Agapito Games, MD   REFERRING PROVIDER: Monica Becton, MD   END OF SESSION:   PT End of Session - 10/09/23 0930     Visit Number 29    Number of Visits 43    Date for PT Re-Evaluation 12/24/23    PT Start Time 0930    PT Stop Time 1010    PT Time Calculation (min) 40 min    Activity Tolerance Patient limited by pain    Behavior During Therapy [facility] for tasks assessed/performed              Past Medical History:  Diagnosis Date   Allergy    Arthritis    Asthma    Chronic prostatitis    Myalgia    Nose injury, initial encounter 06/16/2020   Pulmonary infiltrates    Scrotal pain 01/08/2023   Sinusitis, chronic    Tonsillar abscess    Vasculitis (HCC)    Past Surgical History:  Procedure Laterality Date   BRONCHOSCOPY  2011   TBBX ----inflammation   IR FLUORO GUIDE CV LINE RIGHT  01/31/2017   IR FLUORO GUIDE CV MIDLINE PICC RIGHT  01/30/2017   IR US GUIDE VASC ACCESS RIGHT  01/30/2017   peritonsilar abscess     TYMPANOPLASTY     VIDEO BRONCHOSCOPY Bilateral 06/25/2018   Procedure: VIDEO BRONCHOSCOPY WITHOUT FLUORO;  Surgeon: Coralyn Helling, MD;  Location: [facility];  Service: Cardiopulmonary;  Laterality: Bilateral;   Patient Active Problem List   Diagnosis Date Noted   Testicular swelling, right 01/08/2023   Radiculitis of left cervical region 12/30/2022   Left lumbar radiculitis 12/30/2022   Osteoporosis 12/30/2022   Long term current use of systemic steroids 05/17/2021   Closed fracture of nasal bones 06/19/2020   Coccygeal pain 06/16/2020   Atherosclerosis of aorta (HCC) 04/12/2019   Pulmonary infiltrates    Medication monitoring encounter 03/03/2018   Pulmonary nodule seen on imaging study 01/15/2018   Microscopic polyangiitis (HCC) 03/14/2016   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   Dyshidrotic eczema 04/08/2014   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 Reports he is going to dial back on the aleve. Patient is more aware of hearing deficits in the morning. Notable swelling along left side of neck and face on this date.   Eval: Patient reports intense neck pain that started 1 and half weeks ago.  Patient reports he was fine on Saturday where he went rollerskating and had no falls or issues but the next day his neck/shoulder/arm started to hurt.  Reports he played the guitar which increased his pain on this left side.  Reports he was given prednisone which did not change his symptoms and was also given gabapentin but has not started that yet.  Patient has a difficult time getting comfortable and pain is just continuous in nature.  He gets slight relief with immobilization but no relief with anything else he has tried at this time.   Pain radiates into left upper arm and at times into forearm.  Prior to this episode patient was having occasional symptoms but they would resolve after a short while.  During 1 of these episode he  did have some hand tingling but has not had it since.   Patient also has a current T7 compression fracture which was found during a CT scan of his chest, reports he does have pain in his lower thoracic (points to around T12) but his neck is his biggest issue at this time. Patient is right-handed but is ambidextrous and some activities.   PERTINENT HISTORY:  Osteoporosis, Wegener's granulomatosis (long term steroid use), psoriatic arthritis, T7 compression fracture   PAIN:  Are you having pain? 2/10 in the neck radiating down left shoulder blade and into her arm Aching, sharp, grabbing  PRECAUTIONS: None   WEIGHT BEARING RESTRICTIONS: No   FALLS:  Has patient fallen in last 6 months? No     OCCUPATION: nuclear physicist   PLOF: Independent   PATIENT GOALS: to have less pain and be able to play guitar       OBJECTIVE:    DIAGNOSTIC FINDINGS: 12/30/22 Xray cervical spine  FINDINGS: There is no evidence of cervical spine fracture or prevertebral soft tissue swelling. Alignment is normal. Mild endplate osteophyte formation is present at C5-C6. No significant neural foraminal stenosis bilaterally. The dens is intact and lateral masses are symmetric.   IMPRESSION: Mild degenerative changes at C5-C6.   Xray Lumbar spine FINDINGS: Alignment is anatomic. Vertebral body and disc space heights are normal. No degenerative changes. No definite pars defects.   IMPRESSION: No findings to explain the patient's pain.   CT chest 2 months ago  Musculoskeletal: Degenerative changes in the spine. Slight compression of the T7 vertebral body, unchanged. No worrisome lytic or sclerotic lesions.     COGNITION: Overall cognitive status: Within functional limits for tasks assessed                                  POSTURE: forward head and flat thoracic spine                   Cervical  AROM:   09/02/23  10/01/23   Flexion  WFL  WFL   Extension WFL Limited and painful   R  ROT  WFL Feels better than left rotation   L ROT  WFL Limited and painful   R  SB WFL Limited and painful  L SB WFL Limited and painful                (Blank rows = not tested)                  UE Measurements 10/01/23 -not formally measured or updated on this date secondary to severe levels of pain           Upper Extremity Right 09/02/23 Left 09/02/23 Right   Left      A/PROM MMT A/PROM MMT AROM MMT AROM MMT  Shoulder Flexion 170 4+ 170 4+      Shoulder Extension           Shoulder Abduction  WFL  4+ WFL  5      Shoulder Adduction              Shoulder Internal Rotation Reaches to T6 SP   5  Reaches to T4 SP 5      Shoulder External Rotation Reaches to T4 SP   5 Reaches to T4 SP    5      Elbow Flexion              Elbow Extension              Wrist Flexion              Wrist Extension              Wrist Supination              Wrist Pronation              Wrist Ulnar Deviation              Wrist Radial Deviation              Grip Strength NA   NA                              (Blank rows = not tested)                       * pain in lower back (and in shoulders) - hinges at lower T spine with shoulder flexion    ** increased neuropathy        TODAY'S TREATMENT:                                                                                                                              DATE:   10/09/2023  Therapeutic Exercise: Supine: laying on towel with scapular retraction and rocking back and forth 10 minutes, diaphragmatic breathing x10  Seated: diaphragmatic breathing x10 Educated patient in self lymph massage of face/neck/ chest     Seated:   Neuromuscular Re-education:  Gait Training:  Self Care: Trigger Point Dry Needling:  Manual therapy: STM to left  levator scapula, SCM, anterior scalenes, pec minor. Lymph massage to left mandibular/maxillary/ophthalmic regions of the face --> anterior neck --> anterior chest wall   PATIENT EDUCATION:  Education  details:on HEP, on lymph drainage, compression on nerves and possibility contributing to deafness Person educated: Patient Education method: Programmer, multimedia, Demonstration, and Handouts Education comprehension: verbalized understanding       HOME EXERCISE PROGRAM: 3/19 box breathing/pain relief strategies [id number]  - previous with long exhale breathing     ASSESSMENT:   CLINICAL IMPRESSION: 10/09/2023 Session focused on promoting lymph drainage along left side of face and neck secondary to noticeably swelling, 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 Tolerated well with reduced swelling noted in neck but continued swelling in face and anterior to left ear. Patient reported reduced deafness/hearing improving afterwards. Slight tension and pain noted along levator scapula after supine interventions. This reduced with STM to this area. Discussed anterior and posterior scapular movements throughout the day and supporting left arm to reduce over activation of this muscle. Overall patient doing well and would continue to benefit from skilled PT at this time.   Eval: Patient presents to physical therapy today with severe neck and left upper arm pain that started about 1.5 weeks ago.  Assessment limited secondary to high levels of pain on this date.  Greatest relief felt with cervical traction which patient was educated on as well as other pain management strategies.  Discussed current pain response and importance of addressing pain  to down regulate central nervous system.  Patient severely limited in range of motion, function, sleep and overall quality of life secondary to pain and physical limitations at this time and would greatly benefit from skilled physical therapy.   OBJECTIVE IMPAIRMENTS: decreased activity tolerance, decreased ROM, decreased strength, improper body mechanics, postural dysfunction, and pain.    ACTIVITY LIMITATIONS: lifting, bending, sitting, sleeping,  dressing, reach over head, and locomotion level   PARTICIPATION LIMITATIONS: community activity and occupation, playing guitar   PERSONAL FACTORS: 1-2 comorbidities: osteoporosis, vasculitis,  are also affecting patient's functional outcome.    REHAB POTENTIAL: Good   CLINICAL DECISION MAKING: Evolving/moderate complexity   EVALUATION COMPLEXITY: Moderate     GOALS: Goals reviewed with patient? yes   SHORT TERM GOALS: Target date: 11/26/23 Patient will be independent in self management strategies to improve quality of life and functional outcomes. Baseline: New Program Goal status: MET -previously MET on 09/11/23   2.  Patient will report at least 50% improvement in overall symptoms and/or function to demonstrate improved functional mobility Baseline: 0% better Goal status: MET -previously MET on 09/11/23   3.  Patient will be able to find comfortable position at night to improve ability to get comfortable and fall asleep Baseline: Difficult finding comfortable position Goal status:MET-previously MET on 09/11/23   4.  Patient will demonstrate pain-free cervical range of motion in all directions Baseline: Painful Goal status: MET -previously MET on 09/11/23   5. Patient will demonstrate pain-free cervical range of motion   Current: Painful and unable Goal status: NEW  6. Patient will patient will be able to sit at work without severe pain to be able to do job responsibilities   Current: Unable very painful Goal status: NEW  7. Patient will be able to find relief throughout the day different positions or postures   Current: Unable Goal status: NEW     LONG TERM GOALS: Target date:12/24/23   Patient will report at least 75% improvement in overall  symptoms and/or function to demonstrate improved functional mobility Baseline: 0% better Goal status: MET -previously MET on 09/11/23   2.  Patient will be able to play guitar without pain or difficulties to return to prior level  of function Baseline: Painful/difficult Goal status:  MET -previously MET on 09/11/23   3.  Patient report no radicular symptoms in left upper extremity to improve quality of life. Baseline: Current radicular symptoms into left upper extremity Goal status: MET -previously MET on 09/11/23      4. Patient will report at least 90% improvement in overall symptoms and/or function to demonstrate improved functional mobility   Current: 80% better Goal status: MET -previously MET on 09/11/23  5. 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    Current: unable Goal status: MET  -previously MET on 09/11/23  6. Patient will patient will be able to play drums and guitar without pain or neuropathy in left upper extremity   Current: Unable very painful Goal status: NEW  7. Patient will report at least 75% improvement overall symptoms since symptoms returned   Current: 0% Goal status: NEW  8. Patient will be able to perform tasks around farm limitations or severe pain to return to prior level of function   Current: Unable Goal status: NEW  PLAN:   PT FREQUENCY: 1-2x/week for a total of 16 visits over 12 week certification period   PT DURATION: 12 weeks   PLANNED INTERVENTIONS: Therapeutic exercises, Therapeutic activity, Neuromuscular re-education, Balance training, Gait training, Patient/Family education, Self Care, Joint mobilization, Joint manipulation, Stair training, Canalith repositioning, Orthotic/Fit training, DME instructions, Aquatic Therapy, Dry Needling, Electrical stimulation, Spinal manipulation, Spinal mobilization, Cryotherapy, Moist heat, Traction, Ionotophoresis 4mg /ml Dexamethasone, Manual therapy, and Re-evaluation.   PLAN FOR NEXT SESSION: Strain counterstrain, scapular traction, treat like muscle spasm along left scapular region     10:50 AM, 10/09/23 Tereasa Coop, DPT Physical  Therapy with Dolores Lory

## 2023-10-13 ENCOUNTER — Ambulatory Visit (INDEPENDENT_AMBULATORY_CARE_PROVIDER_SITE_OTHER): Payer: Commercial Managed Care - PPO | Admitting: Physical Therapy

## 2023-10-13 ENCOUNTER — Encounter: Payer: Self-pay | Admitting: Physical Therapy

## 2023-10-13 DIAGNOSIS — M6281 Muscle weakness (generalized): Secondary | ICD-10-CM | POA: Diagnosis not present

## 2023-10-13 DIAGNOSIS — M542 Cervicalgia: Secondary | ICD-10-CM

## 2023-10-13 DIAGNOSIS — M5459 Other low back pain: Secondary | ICD-10-CM

## 2023-10-13 DIAGNOSIS — M5412 Radiculopathy, cervical region: Secondary | ICD-10-CM

## 2023-10-13 NOTE — Therapy (Signed)
OUTPATIENT PHYSICAL THERAPY TREATMENT NOTE      Patient Name: Jesse FRONDA, [name] MRN: [id number] DOB:[birth year], 42 y.o., male Today's Date: 10/13/2023  PCP: Agapito Games, MD   REFERRING PROVIDER: Monica Becton, MD   END OF SESSION:   PT End of Session - 10/13/23 0800     Visit Number 30    Number of Visits 43    Date for PT Re-Evaluation 12/24/23    PT Start Time 0801    PT Stop Time 0842    PT Time Calculation (min) 41 min    Activity Tolerance Patient limited by pain    Behavior During Therapy [facility] for tasks assessed/performed              Past Medical History:  Diagnosis Date   Allergy    Arthritis    Asthma    Chronic prostatitis    Myalgia    Nose injury, initial encounter 06/16/2020   Pulmonary infiltrates    Scrotal pain 01/08/2023   Sinusitis, chronic    Tonsillar abscess    Vasculitis (HCC)    Past Surgical History:  Procedure Laterality Date   BRONCHOSCOPY  2011   TBBX ----inflammation   IR FLUORO GUIDE CV LINE RIGHT  01/31/2017   IR FLUORO GUIDE CV MIDLINE PICC RIGHT  01/30/2017   IR US GUIDE VASC ACCESS RIGHT  01/30/2017   peritonsilar abscess     TYMPANOPLASTY     VIDEO BRONCHOSCOPY Bilateral 06/25/2018   Procedure: VIDEO BRONCHOSCOPY WITHOUT FLUORO;  Surgeon: Coralyn Helling, MD;  Location: [facility];  Service: Cardiopulmonary;  Laterality: Bilateral;   Patient Active Problem List   Diagnosis Date Noted   Testicular swelling, right 01/08/2023   Radiculitis of left cervical region 12/30/2022   Left lumbar radiculitis 12/30/2022   Osteoporosis 12/30/2022   Long term current use of systemic steroids 05/17/2021   Closed fracture of nasal bones 06/19/2020   Coccygeal pain 06/16/2020   Atherosclerosis of aorta (HCC) 04/12/2019   Pulmonary infiltrates    Medication monitoring encounter 03/03/2018   Pulmonary nodule seen on imaging study 01/15/2018   Microscopic polyangiitis (HCC) 03/14/2016   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   Dyshidrotic eczema 04/08/2014   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   MYALGIA 06/28/2010   GERD 06/27/2010   Nonspecific (abnormal) findings on radiological and other examination of body structure 05/29/2010   CT, CHEST, ABNORMAL 05/29/2010   Asthma with bronchitis 04/09/2010   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     THERAPY DIAG:  Cervicalgia  Other low back pain  Muscle weakness (generalized)  Left cervical radiculopathy     REFERRING DIAG:  Left lumbar radiculitis   Rationale for Evaluation and Treatment: Rehabilitation     ONSET DATE: over the weekend   SUBJECTIVE:  SUBJECTIVE STATEMENT: 10/13/2023 States that he doesn't seem to have as much pain along the spine. States that his neck is still tight. States he is still having a dull ache from his neck and arm.  States that he did go on a 5 mile hike this weekend and played the guitar good bit yesterday.   Eval: Patient reports intense neck pain that started 1 and half weeks ago.  Patient reports he was fine on Saturday where he went rollerskating and had no falls or issues but the next day his neck/shoulder/arm started to hurt.  Reports he played the guitar which increased his pain on this left side.  Reports he was given prednisone which did not change his symptoms and was also given gabapentin but has not started that yet.  Patient has a difficult time getting comfortable and pain is just continuous in nature.  He gets slight relief with immobilization but no relief with anything else he has tried at this time.   Pain radiates into left upper arm and at times into forearm.  Prior to this episode patient was having occasional symptoms but they would resolve after a short while.  During 1 of these episode he did  have some hand tingling but has not had it since.   Patient also has a current T7 compression fracture which was found during a CT scan of his chest, reports he does have pain in his lower thoracic (points to around T12) but his neck is his biggest issue at this time. Patient is right-handed but is ambidextrous and some activities.   PERTINENT HISTORY:  Osteoporosis, Wegener's granulomatosis (long term steroid use), psoriatic arthritis, T7 compression fracture   PAIN:  Are you having pain? 4-5/10 in the neck radiating down left shoulder blade and into her arm Aching, sharp, grabbing  PRECAUTIONS: None   WEIGHT BEARING RESTRICTIONS: No   FALLS:  Has patient fallen in last 6 months? No     OCCUPATION: nuclear physicist   PLOF: Independent   PATIENT GOALS: to have less pain and be able to play guitar       OBJECTIVE:    DIAGNOSTIC FINDINGS: 12/30/22 Xray cervical spine  FINDINGS: There is no evidence of cervical spine fracture or prevertebral soft tissue swelling. Alignment is normal. Mild endplate osteophyte formation is present at C5-C6. No significant neural foraminal stenosis bilaterally. The dens is intact and lateral masses are symmetric.   IMPRESSION: Mild degenerative changes at C5-C6.   Xray Lumbar spine FINDINGS: Alignment is anatomic. Vertebral body and disc space heights are normal. No degenerative changes. No definite pars defects.   IMPRESSION: No findings to explain the patient's pain.   CT chest 2 months ago  Musculoskeletal: Degenerative changes in the spine. Slight compression of the T7 vertebral body, unchanged. No worrisome lytic or sclerotic lesions.     COGNITION: Overall cognitive status: Within functional limits for tasks assessed                                  POSTURE: forward head and flat thoracic spine                   Cervical  AROM:   09/02/23  10/01/23   Flexion  WFL  WFL   Extension WFL Limited and painful   R ROT   WFL Feels better than left rotation   L ROT  WFL Limited and painful  R SB WFL Limited and painful  L SB WFL Limited and painful                (Blank rows = not tested)                  UE Measurements 10/01/23 -not formally measured or updated on this date secondary to severe levels of pain           Upper Extremity Right 09/02/23 Left 09/02/23 Right   Left      A/PROM MMT A/PROM MMT AROM MMT AROM MMT  Shoulder Flexion 170 4+ 170 4+      Shoulder Extension           Shoulder Abduction  WFL  4+ WFL  5      Shoulder Adduction              Shoulder Internal Rotation Reaches to T6 SP   5  Reaches to T4 SP 5      Shoulder External Rotation Reaches to T4 SP   5 Reaches to T4 SP    5      Elbow Flexion              Elbow Extension              Wrist Flexion              Wrist Extension              Wrist Supination              Wrist Pronation              Wrist Ulnar Deviation              Wrist Radial Deviation              Grip Strength NA   NA                              (Blank rows = not tested)                       * pain in lower back (and in shoulders) - hinges at lower T spine with shoulder flexion    ** increased neuropathy        TODAY'S TREATMENT:                                                                                                                              DATE:   10/13/2023  Therapeutic Exercise: See education below-10 minutes    Seated:   Neuromuscular Re-education:  Gait Training:  Self Care: Trigger Point Dry Needling:  Manual therapy: STM to left levator scapula, infraspinatus, upper trap, rhomboids, thoracic and cervical paraspinals.  Cervical traction tolerated well, strain counterstrain of left levator  Thermotherapy to cervical and posterior thoracic  region with patient prone.  PATIENT EDUCATION:  Education details:on HEP, on possible benefits of light massage, use of cervical traction device Person educated:  Patient Education method: Explanation, Demonstration, and Handouts Education comprehension: verbalized understanding       HOME EXERCISE PROGRAM: 3/19 box breathing/pain relief strategies [id number]  - previous with long exhale breathing     ASSESSMENT:   CLINICAL IMPRESSION: 10/13/2023 Patient very uncomfortable at start of session.  Did not tolerate supine with any amount of pillows.  Transition to prone and patient still very uncomfortable.  Had a feet which did seem to help as well as strain counterstrain and gentle trigger point release.  Upon transition to sitting patient with significant relief reported with 1-2/10 pain end of session.  Will continue with current plan of care as tolerated.  Eval: Patient presents to physical therapy today with severe neck and left upper arm pain that started about 1.5 weeks ago.  Assessment limited secondary to high levels of pain on this date.  Greatest relief felt with cervical traction which patient was educated on as well as other pain management strategies.  Discussed current pain response and importance of addressing pain  to down regulate central nervous system.  Patient severely limited in range of motion, function, sleep and overall quality of life secondary to pain and physical limitations at this time and would greatly benefit from skilled physical therapy.   OBJECTIVE IMPAIRMENTS: decreased activity tolerance, decreased ROM, decreased strength, improper body mechanics, postural dysfunction, and pain.    ACTIVITY LIMITATIONS: lifting, bending, sitting, sleeping, dressing, reach over head, and locomotion level   PARTICIPATION LIMITATIONS: community activity and occupation, playing guitar   PERSONAL FACTORS: 1-2 comorbidities: osteoporosis, vasculitis,  are also affecting patient's functional outcome.    REHAB POTENTIAL: Good   CLINICAL DECISION MAKING: Evolving/moderate complexity   EVALUATION COMPLEXITY: Moderate     GOALS: Goals  reviewed with patient? yes   SHORT TERM GOALS: Target date: 11/26/23 Patient will be independent in self management strategies to improve quality of life and functional outcomes. Baseline: New Program Goal status: MET -previously MET on 09/11/23   2.  Patient will report at least 50% improvement in overall symptoms and/or function to demonstrate improved functional mobility Baseline: 0% better Goal status: MET -previously MET on 09/11/23   3.  Patient will be able to find comfortable position at night to improve ability to get comfortable and fall asleep Baseline: Difficult finding comfortable position Goal status:MET-previously MET on 09/11/23   4.  Patient will demonstrate pain-free cervical range of motion in all directions Baseline: Painful Goal status: MET -previously MET on 09/11/23   5. Patient will demonstrate pain-free cervical range of motion   Current: Painful and unable Goal status: NEW  6. Patient will patient will be able to sit at work without severe pain to be able to do job responsibilities   Current: Unable very painful Goal status: NEW  7. Patient will be able to find relief throughout the day different positions or postures   Current: Unable Goal status: NEW     LONG TERM GOALS: Target date:12/24/23   Patient will report at least 75% improvement in overall symptoms and/or function to demonstrate improved functional mobility Baseline: 0% better Goal status: MET -previously MET on 09/11/23   2.  Patient will be able to play guitar without pain or difficulties to return to prior level of function Baseline: Painful/difficult Goal status:  MET -previously MET on 09/11/23   3.  Patient  report no radicular symptoms in left upper extremity to improve quality of life. Baseline: Current radicular symptoms into left upper extremity Goal status: MET -previously MET on 09/11/23      4. Patient will report at least 90% improvement in overall symptoms and/or function to  demonstrate improved functional mobility   Current: 80% better Goal status: MET -previously MET on 09/11/23  5. 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    Current: unable Goal status: MET  -previously MET on 09/11/23  6. Patient will patient will be able to play drums and guitar without pain or neuropathy in left upper extremity   Current: Unable very painful Goal status: NEW  7. Patient will report at least 75% improvement overall symptoms since symptoms returned   Current: 0% Goal status: NEW  8. Patient will be able to perform tasks around farm limitations or severe pain to return to prior level of function   Current: Unable Goal status: NEW  PLAN:   PT FREQUENCY: 1-2x/week for a total of 16 visits over 12 week certification period   PT DURATION: 12 weeks   PLANNED INTERVENTIONS: Therapeutic exercises, Therapeutic activity, Neuromuscular re-education, Balance training, Gait training, Patient/Family education, Self Care, Joint mobilization, Joint manipulation, Stair training, Canalith repositioning, Orthotic/Fit training, DME instructions, Aquatic Therapy, Dry Needling, Electrical stimulation, Spinal manipulation, Spinal mobilization, Cryotherapy, Moist heat, Traction, Ionotophoresis 4mg /ml Dexamethasone, Manual therapy, and Re-evaluation.   PLAN FOR NEXT SESSION: Strain counterstrain, scapular traction, treat like muscle spasm along left scapular region     11:54 AM, 10/13/23 Tereasa Coop, DPT Physical Therapy with [facility]

## 2023-10-16 ENCOUNTER — Encounter: Payer: Self-pay | Admitting: Physical Therapy

## 2023-10-16 ENCOUNTER — Ambulatory Visit: Payer: Commercial Managed Care - PPO | Admitting: Physical Therapy

## 2023-10-16 DIAGNOSIS — M5412 Radiculopathy, cervical region: Secondary | ICD-10-CM

## 2023-10-16 DIAGNOSIS — M5459 Other low back pain: Secondary | ICD-10-CM

## 2023-10-16 DIAGNOSIS — M542 Cervicalgia: Secondary | ICD-10-CM

## 2023-10-16 DIAGNOSIS — M6281 Muscle weakness (generalized): Secondary | ICD-10-CM

## 2023-10-16 NOTE — Therapy (Signed)
OUTPATIENT PHYSICAL THERAPY TREATMENT NOTE      Patient Name: Jesse WYSONG, [name] MRN: [id number] DOB:1981-06-28, 42 y.o., male Today's Date: 10/16/2023  PCP: Agapito Games, MD   REFERRING PROVIDER: Monica Becton, MD   END OF SESSION:   PT End of Session - 10/16/23 1517     Visit Number 31    Number of Visits 43    Date for PT Re-Evaluation 12/24/23    PT Start Time 1518    PT Stop Time 1558    PT Time Calculation (min) 40 min    Activity Tolerance Patient limited by pain    Behavior During Therapy [facility] for tasks assessed/performed              Past Medical History:  Diagnosis Date   Allergy    Arthritis    Asthma    Chronic prostatitis    Myalgia    Nose injury, initial encounter 06/16/2020   Pulmonary infiltrates    Scrotal pain 01/08/2023   Sinusitis, chronic    Tonsillar abscess    Vasculitis (HCC)    Past Surgical History:  Procedure Laterality Date   BRONCHOSCOPY  2011   TBBX ----inflammation   IR FLUORO GUIDE CV LINE RIGHT  01/31/2017   IR FLUORO GUIDE CV MIDLINE PICC RIGHT  01/30/2017   IR US GUIDE VASC ACCESS RIGHT  01/30/2017   peritonsilar abscess     TYMPANOPLASTY     VIDEO BRONCHOSCOPY Bilateral 06/25/2018   Procedure: VIDEO BRONCHOSCOPY WITHOUT FLUORO;  Surgeon: Coralyn Helling, MD;  Location: [facility];  Service: Cardiopulmonary;  Laterality: Bilateral;   Patient Active Problem List   Diagnosis Date Noted   Testicular swelling, right 01/08/2023   Radiculitis of left cervical region 12/30/2022   Left lumbar radiculitis 12/30/2022   Osteoporosis 12/30/2022   Long term current use of systemic steroids 05/17/2021   Closed fracture of nasal bones 06/19/2020   Coccygeal pain 06/16/2020   Atherosclerosis of aorta (HCC) 04/12/2019   Pulmonary infiltrates    Medication monitoring encounter 03/03/2018   Pulmonary nodule seen on imaging study 01/15/2018   Microscopic polyangiitis (HCC) 03/14/2016   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   Dyshidrotic eczema 04/08/2014   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 States that the work on that muscle seemed to help. States he has been trying to get his heart rate up, using heat, massaging things and doing some shoulder blade work. Reports he has had some spasming in his armpit. 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 States that his swelling is less.    Eval: Patient reports intense neck pain that started 1 and half weeks ago.  Patient reports he was fine on Saturday where he went rollerskating and had no falls or issues but the next day his neck/shoulder/arm started to hurt.  Reports he played the guitar which increased his pain on this left side.  Reports he was given prednisone which did not change his symptoms and was also given gabapentin but has not started that yet.  Patient has a difficult time getting comfortable and pain is just continuous in nature.  He gets slight relief with immobilization but no relief with anything else he has tried at this time.   Pain radiates into left upper arm and at times into forearm.  Prior to this episode patient was having occasional  symptoms but they would resolve after a short while.  During 1 of these episode he did have some hand tingling but has not had it since.   Patient also has a current T7 compression fracture which was found during a CT scan of his chest, reports he does have pain in his lower thoracic (points to around T12) but his neck is his biggest issue at this time. Patient is right-handed but is ambidextrous and some activities.   PERTINENT HISTORY:  Osteoporosis, Wegener's granulomatosis (long term steroid use), psoriatic arthritis, T7 compression fracture   PAIN:  Are you having pain? 3/10 in the neck radiating down left shoulder blade and into her arm Aching, sharp, grabbing  PRECAUTIONS: None   WEIGHT BEARING RESTRICTIONS: No   FALLS:  Has patient fallen in last 6 months? No     OCCUPATION: nuclear physicist   PLOF: Independent   PATIENT GOALS: to have less pain and be able to play guitar       OBJECTIVE:    DIAGNOSTIC FINDINGS: 12/30/22 Xray cervical spine  FINDINGS: There is no evidence of cervical spine fracture or prevertebral soft tissue swelling. Alignment is normal. Mild endplate osteophyte formation is present at C5-C6. No significant neural foraminal stenosis bilaterally. The dens is intact and lateral masses are symmetric.   IMPRESSION: Mild degenerative changes at C5-C6.   Xray Lumbar spine FINDINGS: Alignment is anatomic. Vertebral body and disc space heights are normal. No degenerative changes. No definite pars defects.   IMPRESSION: No findings to explain the patient's pain.   CT chest 2 months ago  Musculoskeletal: Degenerative changes in the spine. Slight compression of the T7 vertebral body, unchanged. No worrisome lytic or sclerotic lesions.     COGNITION: Overall cognitive status: Within functional limits for tasks assessed                                  POSTURE: forward head and flat thoracic spine                   Cervical  AROM:    09/02/23  10/01/23   Flexion  WFL  WFL   Extension WFL Limited and painful   R ROT  WFL Feels better than  left rotation   L ROT  WFL Limited and painful   R SB WFL Limited and painful  L SB WFL Limited and painful                (Blank rows = not tested)                  UE Measurements 10/01/23 -not formally measured or updated on this date secondary to severe levels of pain           Upper Extremity Right 09/02/23 Left 09/02/23 Right   Left      A/PROM MMT A/PROM MMT AROM MMT AROM MMT  Shoulder Flexion 170 4+ 170 4+      Shoulder Extension           Shoulder Abduction  WFL  4+ WFL  5      Shoulder Adduction              Shoulder Internal Rotation Reaches to T6 SP   5  Reaches to T4 SP 5      Shoulder External Rotation Reaches to T4 SP   5 Reaches to T4 SP    5      Elbow Flexion              Elbow Extension              Wrist Flexion              Wrist Extension              Wrist Supination              Wrist Pronation              Wrist Ulnar Deviation              Wrist Radial Deviation              Grip Strength NA   NA                              (Blank rows = not tested)                       * pain in lower back (and in shoulders) - hinges at lower T spine with shoulder flexion    ** increased neuropathy        TODAY'S TREATMENT:                                                                                                                              DATE:   10/16/2023  Therapeutic Exercise: Supine: Nerve glides done passively to the left arm medial/radial/ulnar, passive range of motion of left arm for stretching 14 minutes Neuromuscular Re-education:  Gait Training:  Self Care: Trigger Point Dry Needling:  Manual therapy: STM to left rotator cuff, pec,  triceps tolerated mildly well.  Scapular mobilization in all directions left, lymph massage to pec and anterior chest wall  Thermotherapy   PATIENT EDUCATION:  Education details:on HEP, on reminder  of using traction device, on light pressure for manual working on determining which interventions are helpful by listening to body Person educated: Patient Education method: Programmer, multimedia, Demonstration, and Handouts Education comprehension: verbalized understanding       HOME EXERCISE PROGRAM: 3/19 box breathing/pain relief strategies [id number]  - previous with long exhale breathing     ASSESSMENT:   CLINICAL IMPRESSION: 10/16/2023 Patient with increased shoulder blade pain on this date.  Tolerated stretching initially well but increased symptoms noted towards end of passive range of motion and soft tissue mobilization to left shoulder region.  Transition to side-lying performed passive scapular mobility as well as scapular mobilization in all directions.  Pain reproduced in intensity and was more of a soreness by the end of session.  Reminded patient of trying traction device as well as strategies to help manage pain and direct interventions at home.  Will continue with current plan of care as tolerated. Eval: Patient presents to physical therapy today with severe neck and left upper arm pain that started about 1.5 weeks ago.  Assessment limited secondary to high levels of pain on this date.  Greatest relief felt with cervical traction which patient was educated on as well as other pain management strategies.  Discussed current pain response and importance of addressing pain  to down regulate central nervous system.  Patient severely limited in range of motion, function, sleep and overall quality of life secondary to pain and physical limitations at this time and would greatly benefit from skilled physical therapy.   OBJECTIVE IMPAIRMENTS: decreased activity tolerance, decreased ROM, decreased strength, improper body mechanics, postural dysfunction, and pain.    ACTIVITY LIMITATIONS: lifting, bending, sitting, sleeping, dressing, reach over head, and locomotion level   PARTICIPATION  LIMITATIONS: community activity and occupation, playing guitar   PERSONAL FACTORS: 1-2 comorbidities: osteoporosis, vasculitis,  are also affecting patient's functional outcome.    REHAB POTENTIAL: Good   CLINICAL DECISION MAKING: Evolving/moderate complexity   EVALUATION COMPLEXITY: Moderate     GOALS: Goals reviewed with patient? yes   SHORT TERM GOALS: Target date: 11/26/23 Patient will be independent in self management strategies to improve quality of life and functional outcomes. Baseline: New Program Goal status: MET -previously MET on 09/11/23   2.  Patient will report at least 50% improvement in overall symptoms and/or function to demonstrate improved functional mobility Baseline: 0% better Goal status: MET -previously MET on 09/11/23   3.  Patient will be able to find comfortable position at night to improve ability to get comfortable and fall asleep Baseline: Difficult finding comfortable position Goal status:MET-previously MET on 09/11/23   4.  Patient will demonstrate pain-free cervical range of motion in all directions Baseline: Painful Goal status: MET -previously MET on 09/11/23   5. Patient will demonstrate pain-free cervical range of motion   Current: Painful and unable Goal status: NEW  6. Patient will patient will be able to sit at work without severe pain to be able to do job responsibilities   Current: Unable very painful Goal status: NEW  7. Patient will be able to find relief throughout the day different positions or postures   Current: Unable Goal status: NEW     LONG TERM GOALS: Target date:12/24/23   Patient will report at least 75% improvement in overall symptoms and/or function  to demonstrate improved functional mobility Baseline: 0% better Goal status: MET -previously MET on 09/11/23   2.  Patient will be able to play guitar without pain or difficulties to return to prior level of function Baseline: Painful/difficult Goal status:  MET  -previously MET on 09/11/23   3.  Patient report no radicular symptoms in left upper extremity to improve quality of life. Baseline: Current radicular symptoms into left upper extremity Goal status: MET -previously MET on 09/11/23      4. Patient will report at least 90% improvement in overall symptoms and/or function to demonstrate improved functional mobility   Current: 80% better Goal status: MET -previously MET on 09/11/23  5. 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    Current: unable Goal status: MET  -previously MET on 09/11/23  6. Patient will patient will be able to play drums and guitar without pain or neuropathy in left upper extremity   Current: Unable very painful Goal status: NEW  7. Patient will report at least 75% improvement overall symptoms since symptoms returned   Current: 0% Goal status: NEW  8. Patient will be able to perform tasks around farm limitations or severe pain to return to prior level of function   Current: Unable Goal status: NEW  PLAN:   PT FREQUENCY: 1-2x/week for a total of 16 visits over 12 week certification period   PT DURATION: 12 weeks   PLANNED INTERVENTIONS: Therapeutic exercises, Therapeutic activity, Neuromuscular re-education, Balance training, Gait training, Patient/Family education, Self Care, Joint mobilization, Joint manipulation, Stair training, Canalith repositioning, Orthotic/Fit training, DME instructions, Aquatic Therapy, Dry Needling, Electrical stimulation, Spinal manipulation, Spinal mobilization, Cryotherapy, Moist heat, Traction, Ionotophoresis 4mg /ml Dexamethasone, Manual therapy, and Re-evaluation.   PLAN FOR NEXT SESSION: Strain counterstrain, scapular traction, treat like muscle spasm along left scapular region     4:15 PM, 10/16/23 Tereasa Coop, DPT Physical Therapy with [facility]

## 2023-10-17 DIAGNOSIS — B449 Aspergillosis, unspecified: Secondary | ICD-10-CM | POA: Diagnosis not present

## 2023-10-17 DIAGNOSIS — M317 Microscopic polyangiitis: Secondary | ICD-10-CM | POA: Diagnosis not present

## 2023-10-17 DIAGNOSIS — Z6822 Body mass index (BMI) 22.0-22.9, adult: Secondary | ICD-10-CM | POA: Diagnosis not present

## 2023-10-17 DIAGNOSIS — L4059 Other psoriatic arthropathy: Secondary | ICD-10-CM | POA: Diagnosis not present

## 2023-10-17 DIAGNOSIS — Z7952 Long term (current) use of systemic steroids: Secondary | ICD-10-CM | POA: Diagnosis not present

## 2023-10-20 ENCOUNTER — Ambulatory Visit: Payer: Commercial Managed Care - PPO | Admitting: Physical Therapy

## 2023-10-20 ENCOUNTER — Encounter: Payer: Self-pay | Admitting: Physical Therapy

## 2023-10-20 DIAGNOSIS — M542 Cervicalgia: Secondary | ICD-10-CM | POA: Diagnosis not present

## 2023-10-20 DIAGNOSIS — M6281 Muscle weakness (generalized): Secondary | ICD-10-CM | POA: Diagnosis not present

## 2023-10-20 DIAGNOSIS — M5412 Radiculopathy, cervical region: Secondary | ICD-10-CM

## 2023-10-20 DIAGNOSIS — M5459 Other low back pain: Secondary | ICD-10-CM | POA: Diagnosis not present

## 2023-10-20 NOTE — Therapy (Signed)
OUTPATIENT PHYSICAL THERAPY TREATMENT NOTE      Patient Name: Jesse ENNIS, [name] MRN: [id number] DOB:February 02, 1981, 42 y.o., male Today's Date: 10/20/2023  PCP: Agapito Games, MD   REFERRING PROVIDER: Monica Becton, MD   END OF SESSION:   PT End of Session - 10/20/23 1256     Visit Number 32    Number of Visits 43    Date for PT Re-Evaluation 12/24/23    PT Start Time 1300    PT Stop Time 1340    PT Time Calculation (min) 40 min    Activity Tolerance Patient limited by pain    Behavior During Therapy [facility] for tasks assessed/performed              Past Medical History:  Diagnosis Date   Allergy    Arthritis    Asthma    Chronic prostatitis    Myalgia    Nose injury, initial encounter 06/16/2020   Pulmonary infiltrates    Scrotal pain 01/08/2023   Sinusitis, chronic    Tonsillar abscess    Vasculitis (HCC)    Past Surgical History:  Procedure Laterality Date   BRONCHOSCOPY  2011   TBBX ----inflammation   IR FLUORO GUIDE CV LINE RIGHT  01/31/2017   IR FLUORO GUIDE CV MIDLINE PICC RIGHT  01/30/2017   IR US GUIDE VASC ACCESS RIGHT  01/30/2017   peritonsilar abscess     TYMPANOPLASTY     VIDEO BRONCHOSCOPY Bilateral 06/25/2018   Procedure: VIDEO BRONCHOSCOPY WITHOUT FLUORO;  Surgeon: Coralyn Helling, MD;  Location: [facility];  Service: Cardiopulmonary;  Laterality: Bilateral;   Patient Active Problem List   Diagnosis Date Noted   Testicular swelling, right 01/08/2023   Radiculitis of left cervical region 12/30/2022   Left lumbar radiculitis 12/30/2022   Osteoporosis 12/30/2022   Long term current use of systemic steroids 05/17/2021   Closed fracture of nasal bones 06/19/2020   Coccygeal pain 06/16/2020   Atherosclerosis of aorta (HCC) 04/12/2019   Pulmonary infiltrates    Medication monitoring encounter 03/03/2018   Pulmonary nodule seen on imaging study 01/15/2018   Microscopic polyangiitis (HCC) 03/14/2016   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   Dyshidrotic eczema 04/08/2014   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 States that he did have a tough weekend  and was having a lot of soreness. States that he used his neck flexion device and that helped. States he was eager to exercise his arm. States that he feels it is almost painful to hold his head up. Reports he notes a lot of pain when trying to go to sleep. Pain is keeping him up. States he is up to 2 pre- GABA a day.   Eval: Patient reports intense neck pain that started 1 and half weeks ago.  Patient reports he was fine on Saturday where he went rollerskating and had no falls or issues but the next day his neck/shoulder/arm started to hurt.  Reports he played the guitar which increased his pain on this left side.  Reports he was given prednisone which did not change his symptoms and was also given gabapentin but has not started that yet.  Patient has a difficult time getting comfortable and pain is just continuous in nature.  He gets slight relief with immobilization but no relief with anything else he has tried at this time.   Pain radiates into left upper arm and at times  into forearm.  Prior to this episode patient was having occasional symptoms but they would resolve after a short while.  During 1 of these episode he did have some hand tingling but has not had it since.   Patient also has a current T7 compression fracture which was found during a CT scan of his chest, reports he does have pain in his lower thoracic (points to around T12) but his neck is his biggest issue at this time. Patient is right-handed but is ambidextrous and some activities.   PERTINENT HISTORY:  Osteoporosis, Wegener's granulomatosis (long term steroid use), psoriatic arthritis, T7 compression fracture   PAIN:  Are you having pain? 4/10 in the neck radiating down left shoulder blade and into her arm Aching, sharp, grabbing  PRECAUTIONS: None   WEIGHT BEARING RESTRICTIONS: No   FALLS:  Has patient fallen in last 6 months? No     OCCUPATION: nuclear physicist   PLOF: Independent   PATIENT GOALS: to have less pain and be able to play guitar       OBJECTIVE:    DIAGNOSTIC FINDINGS: 12/30/22 Xray cervical spine  FINDINGS: There is no evidence of cervical spine fracture or prevertebral soft tissue swelling. Alignment is normal. Mild endplate osteophyte formation is present at C5-C6. No significant neural foraminal stenosis bilaterally. The dens is intact and lateral masses are symmetric.   IMPRESSION: Mild degenerative changes at C5-C6.   Xray Lumbar spine FINDINGS: Alignment is anatomic. Vertebral body and disc space heights are normal. No degenerative changes. No definite pars defects.   IMPRESSION: No findings to explain the patient's pain.   CT chest 2 months ago  Musculoskeletal: Degenerative changes in the spine. Slight compression of the T7 vertebral body, unchanged. No worrisome lytic or sclerotic lesions.     COGNITION: Overall cognitive status: Within functional limits for tasks assessed                                  POSTURE: forward head  and flat thoracic spine                   Cervical  AROM:   09/02/23  10/01/23  Flexion  [facility]  WFL   Extension WFL Limited and painful   R ROT  WFL Feels better than left rotation   L ROT  WFL Limited and painful   R SB WFL Limited and painful  L SB WFL Limited and painful                (Blank rows = not tested)                  UE Measurements 10/01/23 -not formally measured or updated on this date secondary to severe levels of pain           Upper Extremity Right 09/02/23 Left 09/02/23 Right   Left      A/PROM MMT A/PROM MMT AROM MMT AROM MMT  Shoulder Flexion 170 4+ 170 4+      Shoulder Extension           Shoulder Abduction  WFL  4+ WFL  5      Shoulder Adduction              Shoulder Internal Rotation Reaches to T6 SP   5  Reaches to T4 SP 5      Shoulder External Rotation Reaches to T4 SP   5 Reaches to T4 SP    5      Elbow Flexion              Elbow Extension              Wrist Flexion              Wrist Extension              Wrist Supination              Wrist Pronation              Wrist Ulnar Deviation              Wrist Radial Deviation              Grip Strength NA   NA                              (Blank rows = not tested)                       * pain in lower back (and in shoulders) - hinges at lower T spine with shoulder flexion    ** increased neuropathy        TODAY'S TREATMENT:                                                                                                                              DATE:   10/20/2023  Therapeutic Exercise: Prone: Passive range of motion left shoulder into internal and external rotation end range holds 10 minutes total Seated: Left shoulder  internal/external rotation at 90 degrees of shoulder abduction 5 minutes total Quad: Scap protraction and retraction x 5 Neuromuscular Re-education:  Gait Training:  Self Care: Trigger Point Dry Needling:  Manual therapy: STM to left rotator cuff, rhomboids, levator  scapula, cervical and thoracic paraspinals.  Cervical traction and decompression in prone tolerated well  Thermotherapy   PATIENT EDUCATION:  Education details:on HEP, about continued discomfort and changes symptoms and follow-up with MD secondary to not recently seeing MD about current presentation and making sure were not missing anything Person educated: Patient Education method: Explanation, Demonstration, and Handouts Education comprehension: verbalized understanding       HOME EXERCISE PROGRAM: 3/19 box breathing/pain relief strategies [id number]  - previous with long exhale breathing     ASSESSMENT:   CLINICAL IMPRESSION: 10/20/2023 Continues to tolerate manual interventions well. No pain noted end of session. Discussed Md f/u despite this secondary to not seeing MD recently. Answered all questions and discussed reducing 1-2 possible pain provoking activities (guitar playing). Reduced musculare tension end of session. Will progress to strengthening once able.   Eval: Patient presents to physical therapy today with severe neck and left upper arm pain that started about 1.5 weeks ago.  Assessment limited secondary to high levels of pain on this date.  Greatest relief felt with cervical traction which patient was educated on as well as other pain management strategies.  Discussed current pain response and importance of addressing pain  to down regulate central nervous system.  Patient severely limited in range of motion, function, sleep and overall quality of life secondary to pain and physical limitations at this time and would greatly benefit from skilled physical therapy.   OBJECTIVE IMPAIRMENTS: decreased activity tolerance, decreased ROM, decreased strength, improper body mechanics, postural dysfunction, and pain.    ACTIVITY LIMITATIONS: lifting, bending, sitting, sleeping, dressing, reach over head, and locomotion level   PARTICIPATION LIMITATIONS: community activity and  occupation, playing guitar   PERSONAL FACTORS: 1-2 comorbidities: osteoporosis, vasculitis,  are also affecting patient's functional outcome.    REHAB POTENTIAL: Good   CLINICAL DECISION MAKING: Evolving/moderate complexity   EVALUATION COMPLEXITY: Moderate     GOALS: Goals reviewed with patient? yes   SHORT TERM GOALS: Target date: 11/26/23 Patient will be independent in self management strategies to improve quality of life and functional outcomes. Baseline: New Program Goal status: MET -previously MET on 09/11/23   2.  Patient will report at least 50% improvement in overall symptoms and/or function to demonstrate improved functional mobility Baseline: 0% better Goal status: MET -previously MET on 09/11/23   3.  Patient will be able to find comfortable position at night to improve ability to get comfortable and fall asleep Baseline: Difficult finding comfortable position Goal status:MET-previously MET on 09/11/23   4.  Patient will demonstrate pain-free cervical range of motion in all directions Baseline: Painful Goal status: MET -previously MET on 09/11/23   5. Patient will demonstrate pain-free cervical range of motion   Current: Painful and unable Goal status: NEW  6. Patient will patient will be able to sit at work without severe pain to be able to do job responsibilities   Current: Unable very painful Goal status: NEW  7. Patient will be able to find relief throughout the day different positions or postures   Current: Unable Goal status: NEW     LONG TERM GOALS: Target date:12/24/23   Patient will report at least 75% improvement in overall symptoms and/or function to demonstrate improved functional mobility Baseline:  0% better Goal status: MET -previously MET on 09/11/23   2.  Patient will be able to play guitar without pain or difficulties to return to prior level of function Baseline: Painful/difficult Goal status:  MET -previously MET on 09/11/23   3.   Patient report no radicular symptoms in left upper extremity to improve quality of life. Baseline: Current radicular symptoms into left upper extremity Goal status: MET -previously MET on 09/11/23      4. Patient will report at least 90% improvement in overall symptoms and/or function to demonstrate improved functional mobility   Current: 80% better Goal status: MET -previously MET on 09/11/23  5. 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    Current: unable Goal status: MET  -previously MET on 09/11/23  6. Patient will patient will be able to play drums and guitar without pain or neuropathy in left upper extremity   Current: Unable very painful Goal status: NEW  7. Patient will report at least 75% improvement overall symptoms since symptoms returned   Current: 0% Goal status: NEW  8. Patient will be able to perform tasks around farm limitations or severe pain to return to prior level of function   Current: Unable Goal status: NEW  PLAN:   PT FREQUENCY: 1-2x/week for a total of 16 visits over 12 week certification period   PT DURATION: 12 weeks   PLANNED INTERVENTIONS: Therapeutic exercises, Therapeutic activity, Neuromuscular re-education, Balance training, Gait training, Patient/Family education, Self Care, Joint mobilization, Joint manipulation, Stair training, Canalith repositioning, Orthotic/Fit training, DME instructions, Aquatic Therapy, Dry Needling, Electrical stimulation, Spinal manipulation, Spinal mobilization, Cryotherapy, Moist heat, Traction, Ionotophoresis 4mg /ml Dexamethasone, Manual therapy, and Re-evaluation.   PLAN FOR NEXT SESSION: Sleeper stretch on elevated table not in abduction. Strain counterstrain, scapular traction, treat like muscle spasm along left scapular region     2:58 PM, 10/20/23 Tereasa Coop, DPT Physical Therapy with [facility]

## 2023-10-21 ENCOUNTER — Other Ambulatory Visit: Payer: Self-pay

## 2023-10-23 ENCOUNTER — Ambulatory Visit: Payer: Commercial Managed Care - PPO | Admitting: Physical Therapy

## 2023-10-23 ENCOUNTER — Encounter: Payer: Self-pay | Admitting: Physical Therapy

## 2023-10-23 DIAGNOSIS — M542 Cervicalgia: Secondary | ICD-10-CM

## 2023-10-23 DIAGNOSIS — M5412 Radiculopathy, cervical region: Secondary | ICD-10-CM | POA: Diagnosis not present

## 2023-10-23 DIAGNOSIS — M6281 Muscle weakness (generalized): Secondary | ICD-10-CM

## 2023-10-23 DIAGNOSIS — M5459 Other low back pain: Secondary | ICD-10-CM | POA: Diagnosis not present

## 2023-10-23 NOTE — Therapy (Signed)
 OUTPATIENT PHYSICAL THERAPY TREATMENT NOTE      Patient Name: Jesse DELATTE, [name] MRN: [id number] DOB:November 21, 1980, 43 y.o., male Today's Date: 10/23/2023  PCP: Alvan Dorothyann BIRCH, MD   REFERRING PROVIDER: Curtis Debby JINNY, MD   END OF SESSION:   PT End of Session - 10/23/23 1347     Visit Number 33    Number of Visits 43    Date for PT Re-Evaluation 12/24/23    PT Start Time 1348    PT Stop Time 1430    PT Time Calculation (min) 42 min    Activity Tolerance Patient limited by pain    Behavior During Therapy [facility] for tasks assessed/performed              Past Medical History:  Diagnosis Date   Allergy    Arthritis    Asthma    Chronic prostatitis    Myalgia    Nose injury, initial encounter 06/16/2020   Pulmonary infiltrates    Scrotal pain 01/08/2023   Sinusitis, chronic    Tonsillar abscess    Vasculitis (HCC)    Past Surgical History:  Procedure Laterality Date   BRONCHOSCOPY  2011   TBBX ----inflammation   IR FLUORO GUIDE CV LINE RIGHT  01/31/2017   IR FLUORO GUIDE CV MIDLINE PICC RIGHT  01/30/2017   IR US  GUIDE VASC ACCESS RIGHT  01/30/2017   peritonsilar abscess     TYMPANOPLASTY     VIDEO BRONCHOSCOPY Bilateral 06/25/2018   Procedure: VIDEO BRONCHOSCOPY WITHOUT FLUORO;  Surgeon: Shellia Oh, MD;  Location: [facility];  Service: Cardiopulmonary;  Laterality: Bilateral;   Patient Active Problem List   Diagnosis Date Noted   Testicular swelling, right 01/08/2023   Radiculitis of left cervical region 12/30/2022   Left lumbar radiculitis 12/30/2022   Osteoporosis 12/30/2022   Long term current use of systemic steroids 05/17/2021   Closed fracture of nasal bones 06/19/2020   Coccygeal pain 06/16/2020   Atherosclerosis of aorta (HCC) 04/12/2019   Pulmonary infiltrates    Medication monitoring encounter 03/03/2018   Pulmonary nodule seen on imaging study 01/15/2018   Microscopic polyangiitis (HCC) 03/14/2016   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   Dyshidrotic eczema 04/08/2014   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   MYALGIA 06/28/2010   GERD 06/27/2010   Nonspecific (abnormal) findings on radiological and other examination of body structure 05/29/2010   CT, CHEST, ABNORMAL 05/29/2010   Asthma with bronchitis 04/09/2010   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  States he was working on his shoulder muscles. States he was able to get out of pain when it increased.  States the traction device helps.    Eval: Patient reports intense neck pain that started 1 and half weeks ago.  Patient reports he was fine on Saturday where he went rollerskating and had no falls or issues but the next day his neck/shoulder/arm started to hurt.  Reports he played the guitar which increased his pain on this left side.  Reports he was given prednisone  which did not change his symptoms and was also given gabapentin  but has not started that yet.  Patient has a difficult time getting comfortable and pain is just continuous in nature.  He gets slight relief with immobilization but no relief with anything else he has tried at this time.   Pain radiates into left upper arm and at times into forearm.  Prior to this episode patient was having occasional symptoms but they would resolve after a short while.  During 1 of these episode he did have some hand tingling but has not had it since.    Patient also has a current T7 compression fracture which was found during a CT scan of his chest, reports he does have pain in his lower thoracic (points to around T12) but his neck is his biggest issue at this time. Patient is right-handed but is ambidextrous and some activities.   PERTINENT HISTORY:  Osteoporosis, Wegener's granulomatosis (long term steroid use), psoriatic arthritis, T7 compression fracture   PAIN:  Are you having pain? 3/10 in the neck radiating down left shoulder blade and into her arm Aching, sharp, grabbing  PRECAUTIONS: None   WEIGHT BEARING RESTRICTIONS: No   FALLS:  Has patient fallen in last 6 months? No     OCCUPATION: nuclear physicist   PLOF: Independent   PATIENT GOALS: to have less pain and be able to play guitar       OBJECTIVE:    DIAGNOSTIC FINDINGS: 12/30/22 Xray cervical spine  FINDINGS: There is no evidence of cervical spine fracture or prevertebral soft tissue swelling. Alignment is normal. Mild endplate osteophyte formation is present at C5-C6. No significant neural foraminal stenosis bilaterally. The dens is intact and lateral masses are symmetric.   IMPRESSION: Mild degenerative changes at C5-C6.   Xray Lumbar spine FINDINGS: Alignment is anatomic. Vertebral body and disc space heights are normal. No degenerative changes. No definite pars defects.   IMPRESSION: No findings to explain the patient's pain.   CT chest 2 months ago  Musculoskeletal: Degenerative changes in the spine. Slight compression of the T7 vertebral body, unchanged. No worrisome lytic or sclerotic lesions.     COGNITION: Overall cognitive status: Within functional limits for tasks assessed                                  POSTURE: forward head and flat thoracic spine                   Cervical  AROM:   09/02/23  10/01/23   Flexion  WFL  WFL   Extension WFL Limited and painful   R ROT  WFL Feels better than left rotation   L ROT  WFL  Limited and painful   R SB WFL Limited and painful  L SB WFL Limited  and painful                (Blank rows = not tested)                  UE Measurements 10/01/23 -not formally measured or updated on this date secondary to severe levels of pain           Upper Extremity Right 09/02/23 Left 09/02/23 Right   Left      A/PROM MMT A/PROM MMT AROM MMT AROM MMT  Shoulder Flexion 170 4+ 170 4+      Shoulder Extension           Shoulder Abduction  WFL  4+ WFL  5      Shoulder Adduction              Shoulder Internal Rotation Reaches to T6 SP   5  Reaches to T4 SP 5      Shoulder External Rotation Reaches to T4 SP   5 Reaches to T4 SP    5      Elbow Flexion              Elbow Extension              Wrist Flexion              Wrist Extension              Wrist Supination              Wrist Pronation              Wrist Ulnar Deviation              Wrist Radial Deviation              Grip Strength NA   NA                              (Blank rows = not tested)                       * pain in lower back (and in shoulders) - hinges at lower T spine with shoulder flexion    ** increased neuropathy        TODAY'S TREATMENT:                                                                                                                              DATE:   10/23/2023  Therapeutic Exercise: S/l: scapular PROM left all directions - tolerated well Review of entire HEP and strategies to help with pain    Seated: Lat pull down blue band 2x15, PROM left shoulder IR/ER stretch Standing shoulder extension 2x15 blue band, tall posture with posterior support- tolerated poorly, chest opener with yoga strap - no benefit stopped Quad:   Neuromuscular Re-education:  Gait Training:  Self  Care: Trigger Point Dry Needling:  Manual therapy: STM to left rhomboids, levator scapula, cervical and thoracic paraspinals.  Cervical traction and decompression in prone tolerated well, first rib mobilization  left grade II/III with breathing - tolerated well   Thermotherapy   PATIENT EDUCATION:  Education details:on HEP,   Person educated: Patient Education method: Explanation, Demonstration, and Handouts Education comprehension: verbalized understanding       HOME EXERCISE PROGRAM: 3/19 box breathing/pain relief strategies [id number]  - previous with long exhale breathing     ASSESSMENT:   CLINICAL IMPRESSION: 10/23/2023 Continues to tolerate manual interventions well. Trailed band exercises secondary to patient wanting to work muscles more. Tolerated these well initially but increase in symptoms noted so transitioned to manual to reduce symptoms. This helped reduce overall pain symptoms and muscle activation.   Eval: Patient presents to physical therapy today with severe neck and left upper arm pain that started about 1.5 weeks ago.  Assessment limited secondary to high levels of pain on this date.  Greatest relief felt with cervical traction which patient was educated on as well as other pain management strategies.  Discussed current pain response and importance of addressing pain  to down regulate central nervous system.  Patient severely limited in range of motion, function, sleep and overall quality of life secondary to pain and physical limitations at this time and would greatly benefit from skilled physical therapy.   OBJECTIVE IMPAIRMENTS: decreased activity tolerance, decreased ROM, decreased strength, improper body mechanics, postural dysfunction, and pain.    ACTIVITY LIMITATIONS: lifting, bending, sitting, sleeping, dressing, reach over head, and locomotion level   PARTICIPATION LIMITATIONS: community activity and occupation, playing guitar   PERSONAL FACTORS: 1-2 comorbidities: osteoporosis, vasculitis,  are also affecting patient's functional outcome.    REHAB POTENTIAL: Good   CLINICAL DECISION MAKING: Evolving/moderate complexity   EVALUATION COMPLEXITY: Moderate      GOALS: Goals reviewed with patient? yes   SHORT TERM GOALS: Target date: 11/26/23 Patient will be independent in self management strategies to improve quality of life and functional outcomes. Baseline: New Program Goal status: MET -previously MET on 09/11/23   2.  Patient will report at least 50% improvement in overall symptoms and/or function to demonstrate improved functional mobility Baseline: 0% better Goal status: MET -previously MET on 09/11/23   3.  Patient will be able to find comfortable position at night to improve ability to get comfortable and fall asleep Baseline: Difficult finding comfortable position Goal status:MET-previously MET on 09/11/23   4.  Patient will demonstrate pain-free cervical range of motion in all directions Baseline: Painful Goal status: MET -previously MET on 09/11/23   5. Patient will demonstrate pain-free cervical range of motion   Current: Painful and unable Goal status: NEW  6. Patient will patient will be able to sit at work without severe pain to be able to do job responsibilities   Current: Unable very painful Goal status: NEW  7. Patient will be able to find relief throughout the day different positions or postures   Current: Unable Goal status: NEW     LONG TERM GOALS: Target date:12/24/23   Patient will report at least 75% improvement in overall symptoms and/or function to demonstrate improved functional mobility Baseline: 0% better Goal status: MET -previously MET on 09/11/23   2.  Patient will be able to play guitar without pain or difficulties to return to prior level of function Baseline: Painful/difficult Goal status:  MET -previously MET on 09/11/23   3.  Patient report no radicular symptoms in left upper extremity to improve quality of life. Baseline: Current radicular symptoms into left upper extremity Goal status: MET -previously MET on 09/11/23      4. Patient will report at least 90% improvement in overall symptoms  and/or function to demonstrate improved functional mobility   Current: 80% better Goal status: MET -previously MET on 09/11/23  5. 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    Current: unable Goal status: MET  -previously MET on 09/11/23  6. Patient will patient will be able to play drums and guitar without pain or neuropathy in left upper extremity   Current: Unable very painful Goal status: NEW  7. Patient will report at least 75% improvement overall symptoms since symptoms returned   Current: 0% Goal status: NEW  8. Patient will be able to perform tasks around farm limitations or severe pain to return to prior level of function   Current: Unable Goal status: NEW  PLAN:   PT FREQUENCY: 1-2x/week for a total of 16 visits over 12 week certification period   PT DURATION: 12 weeks   PLANNED INTERVENTIONS: Therapeutic exercises, Therapeutic activity, Neuromuscular re-education, Balance training, Gait training, Patient/Family education, Self Care, Joint mobilization, Joint manipulation, Stair training, Canalith repositioning, Orthotic/Fit training, DME instructions, Aquatic Therapy, Dry Needling, Electrical stimulation, Spinal manipulation, Spinal mobilization, Cryotherapy, Moist heat, Traction, Ionotophoresis 4mg /ml Dexamethasone, Manual therapy, and Re-evaluation.   PLAN FOR NEXT SESSION: Sleeper stretch on elevated table not in abduction. Strain counterstrain, scapular traction, treat like muscle spasm along left scapular region     3:45 PM, 10/23/23 Olivia Church, DPT Physical Therapy with [place]

## 2023-10-24 ENCOUNTER — Telehealth: Payer: Self-pay

## 2023-10-24 NOTE — Telephone Encounter (Signed)
 Auth Submission: APPROVED Site of care: Site of care: [facility] Payer: [employer] Medication & CPT/J Code(s) submitted: Truxima  (Rituximab -abbs) [id number] Route of submission (phone, fax, portal): portal Phone # Fax # Auth type: Buy/Bill PB Units/visits requested: 1000mg  x 2 doses Reference number: [id number] Approval from: 10/22/23 to 04/19/24

## 2023-10-28 ENCOUNTER — Encounter: Payer: Self-pay | Admitting: Physical Therapy

## 2023-10-28 ENCOUNTER — Encounter: Payer: Self-pay | Admitting: Family Medicine

## 2023-10-28 ENCOUNTER — Ambulatory Visit: Payer: Commercial Managed Care - PPO | Admitting: Physical Therapy

## 2023-10-28 ENCOUNTER — Ambulatory Visit: Payer: Commercial Managed Care - PPO | Admitting: Family Medicine

## 2023-10-28 VITALS — BP 126/80 | HR 64 | Ht 79.0 in | Wt 198.0 lb

## 2023-10-28 DIAGNOSIS — M542 Cervicalgia: Secondary | ICD-10-CM | POA: Diagnosis not present

## 2023-10-28 DIAGNOSIS — Z8781 Personal history of (healed) traumatic fracture: Secondary | ICD-10-CM | POA: Diagnosis not present

## 2023-10-28 DIAGNOSIS — T380X5A Adverse effect of glucocorticoids and synthetic analogues, initial encounter: Secondary | ICD-10-CM

## 2023-10-28 DIAGNOSIS — M818 Other osteoporosis without current pathological fracture: Secondary | ICD-10-CM

## 2023-10-28 DIAGNOSIS — M5459 Other low back pain: Secondary | ICD-10-CM

## 2023-10-28 DIAGNOSIS — M6281 Muscle weakness (generalized): Secondary | ICD-10-CM | POA: Diagnosis not present

## 2023-10-28 DIAGNOSIS — M5412 Radiculopathy, cervical region: Secondary | ICD-10-CM

## 2023-10-28 NOTE — Progress Notes (Signed)
 I, Leotis Batter, CMA acting as a scribe for Artist Lloyd, MD.  Jesse Wong is a 43 y.o. male who presents to [facility] at [facility] today for neck and back pain x 1 year, off an on, most recent flare up in Dec 2024. Hx of thoracic compression fx. Pt was seen previously by Dr. ONEIDA. Pt locates pain to left upper back and neck radiating into the shoulder, chest, and periscapular region.  He has had over 30 physical therapy sessions for this neck arm and chest pain which has helped but he still has some symptoms and remaining pain.    Additionally he has a diagnosis of osteoporosis.  He has been on steroids for extended periods of time and this past for his Wegener's granulomatosis.  He has suffered 2 spontaneous compression fractures in his spine and was ultimately found to have a T-score of -2.4 and referred to endocrinology for osteoporosis management.  He currently is on alendronate .  Radiates: L UE Numbness/tingling: intermittent Weakness: no Aggravates: activity Treatments tried:  PT x 33 visits,   Dx testing: 02/02/23 C-spine & T-spine MRI  12/30/22 C-spine & L-spine XR  05/15/22 DEXA scan  10/23/21 L-spine & T-spine MRI  10/19/21 L-spine & T-spine XR  Pertinent review of systems: No fevers or chills  Relevant historical information: Autoimmune disorder as above.  Osteoporosis.   Exam:  BP 126/80   Pulse 64   Ht 6' 7 (2.007 m)   Wt 198 lb (89.8 kg)   SpO2 99%   BMI 22.31 kg/m  General: Well Developed, well nourished, and in no acute distress.   MSK: C-spine: Normal appearing nontender midline cervical spine. Decreased cervical motion. Upper extremity strength and reflexes are intact.  Left shoulder normal-appearing normal motion intact strength negative impingement testing.    Lab and Radiology Results  XAM: MRI CERVICAL SPINE WITHOUT CONTRAST   TECHNIQUE: Multiplanar, multisequence MR imaging of the cervical spine was performed. No  intravenous contrast was administered.   COMPARISON:  Prior radiograph from 12/30/2022.   FINDINGS: Alignment: Straightening of the normal cervical lordosis. No listhesis.   Vertebrae: Vertebral body height maintained without acute or chronic fracture. Bone marrow signal intensity within normal limits. No discrete or worrisome osseous lesions. No abnormal marrow edema.   Cord: Normal signal and morphology.   Posterior Fossa, vertebral arteries, paraspinal tissues: Visualized brain and posterior fossa within normal limits. Mild mucosal thickening noted within the visualized paranasal sinuses. Craniocervical junction normal. Paraspinous soft tissues within normal limits. Normal flow voids seen within the vertebral arteries bilaterally.   Disc levels:   C2-C3: Unremarkable.   C3-C4: Left paracentral disc protrusion indents the ventral thecal sac (series 6, image 11). No significant spinal stenosis or frank cord impingement. Superimposed mild bilateral uncovertebral hypertrophy without significant foraminal encroachment.   C4-C5: Mild left eccentric disc bulge with endplate spurring. Mild flattening of the ventral thecal sac without significant spinal stenosis or frank cord impingement. Foramina remain adequately patent.   C5-C6: Broad-based left paracentral disc osteophyte complex flattens and partially effaces the ventral thecal sac (series 5, image 20). Superimposed tiny central soft disc protrusion with annular fissure (series 6, image 21). Mild flattening of the ventral cord without cord signal changes. Mild spinal stenosis. Moderate left with mild right C6 foraminal narrowing.   C6-C7: Degenerative intervertebral disc space narrowing with diffuse disc bulge and bilateral uncovertebral spurring, asymmetric to the left. Probable superimposed small left foraminal disc protrusion (series 6, image 25).  Flattening of the ventral thecal sac without significant spinal  stenosis. Severe left C7 foraminal stenosis. Right neural foramina remains patent.   C7-T1: Negative interspace. Mild right-sided facet hypertrophy. No canal or foraminal stenosis.   IMPRESSION: 1. Left paracentral disc osteophyte complex at C5-6 with resultant mild canal and moderate left C6 foraminal stenosis. 2. Left eccentric disc bulge with superimposed small left foraminal disc protrusion at C6-7 with resultant severe left C7 foraminal stenosis. 3. Small left paracentral disc protrusion at C3-4 without significant stenosis.     Electronically Signed   By: Morene Hoard M.D.   On: 02/03/2023 04:07    EXAM: MRI THORACIC SPINE WITHOUT CONTRAST   TECHNIQUE: Multiplanar, multisequence MR imaging of the thoracic spine was performed. No intravenous contrast was administered.   COMPARISON:  Comparison made with prior CT from 12/02/2022 as well as MRI from 10/23/2021.   FINDINGS: Alignment: Mild sigmoid scoliotic curvature of the thoracic spine again noted, stable. Alignment otherwise normal with preservation of the normal thoracic kyphosis. No listhesis.   Vertebrae: Mild chronic compression deformity involving the superior endplate of T7. Associated height loss measures up to 25% without bony retropulsion. Additional chronic compression deformity of the superior endplate of T11 with mild 15% height loss without bony retropulsion. Chronic compression deformity at the superior endplate of T12 with mild 20% height loss without bony retropulsion. Few scattered endplate Schmorl's node deformities noted throughout the mid and lower thoracic spine.   Otherwise, vertebral body height maintained. No acute or recent fracture. Bone marrow signal intensity within normal limits. No discrete or worrisome osseous lesions. No abnormal marrow edema.   Cord:  Normal signal and morphology.   Paraspinal and other soft tissues: Paraspinous soft tissues are within normal limits.  Trace layering bilateral pleural effusions noted.   Disc levels:   T1-2:  Unremarkable.   T2-3: Normal interspace. Mild left-sided ligament flavum hypertrophy. No canal or foraminal stenosis.   T3-4: Normal interspace. Mild posterior element hypertrophy. No canal or foraminal stenosis.   T4-5:  Unremarkable.   T5-6:  Unremarkable.   T6-7: Normal interspace. Mild right greater than left facet hypertrophy. No spinal stenosis. Foramina remain patent.   T7-8: Central T1/T2 hypointensity within the T7-8 interspace, consistent with calcification, corresponding with calcific density seen on prior chest CT. No disc bulge or focal disc herniation. Mild posterior element hypertrophy. No canal or foraminal stenosis.   T8-9: Small chronic endplate Schmorl's node deformities. No significant disc bulge or focal disc herniation. Mild posterior element hypertrophy. No canal or foraminal stenosis.   T9-10: Small chronic endplate Schmorl's node deformities with probable minimal discal calcification. No disc bulge or focal disc herniation. Mild posterior element hypertrophy. No canal or foraminal stenosis.   T10-11: Small chronic endplate Schmorl's node deformities. No significant disc bulge or focal disc herniation. Mild facet hypertrophy. No canal or foraminal stenosis.   T11-12: Disc desiccation without significant disc bulge. Minimal facet hypertrophy. No canal or foraminal stenosis.   T12-L1:  Unremarkable.   IMPRESSION: 1. Mild chronic compression deformities involving the superior endplates of T7, T11, and T12 with up to 25% height loss without bony retropulsion. 2. Sigmoid scoliosis with associated mild multilevel facet hypertrophy throughout the thoracic spine as above. Finding could contribute to back pain. 3. Distal calcification involving the T7-8 interspace, corresponding with calcific density seen on prior CT from 12/02/2022. No significant disc bulge or focal disc  herniation within the thoracic spine. No stenosis or neural impingement. 4. Trace layering bilateral  pleural effusions.     Electronically Signed   By: Morene Hoard M.D.   On: 02/03/2023 04:29 I, Artist Lloyd, personally (independently) visualized and performed the interpretation of the images attached in this note.    Assessment and Plan: 43 y.o. male with left cervical radiculopathy.  This is a chronic ongoing issue.  He has had improvement with physical therapy but still has remaining symptoms.  Next step treatment at the spine would be cervical epidural steroid injection.  He thinks his pain is not quite bad enough right now to do that.  Happy to arrange for it is needed in the future with a phone call or MyChart message.  Continue home exercise program.  Additionally he has osteoporosis.  His osteoporosis is secondary to long-term steroid exposure.  He is 43 years old and has a T-score of -2.4 and has had 2 compression fractures.  I think at this point an anabolic agent would be better than alendronate .  Will work on authorization for Forteo or Tymlos .  He has had assessment of his parathyroid system and has normal parathyroid hormone labs and normal vitamin D  labs.   PDMP not reviewed this encounter. No orders of the defined types were placed in this encounter.  No orders of the defined types were placed in this encounter.    Discussed warning signs or symptoms. Please see discharge instructions. Patient expresses understanding.   The above documentation has been reviewed and is accurate and complete Artist Lloyd, M.D.

## 2023-10-28 NOTE — Therapy (Signed)
 OUTPATIENT PHYSICAL THERAPY TREATMENT NOTE      Patient Name: Jesse Wong MRN: [id number] DOB:03-Mar-1981, 43 y.o., male Today's Date: 10/28/2023  PCP: Alvan Dorothyann BIRCH, MD   REFERRING PROVIDER: Curtis Debby JINNY, MD   END OF SESSION:   PT End of Session - 10/28/23 0932     Visit Number 34    Number of Visits 43    Date for PT Re-Evaluation 12/24/23    Progress Note Due on Visit 36    PT Start Time 0933    PT Stop Time 1013    PT Time Calculation (min) 40 min    Activity Tolerance Patient limited by pain    Behavior During Therapy [facility] for tasks assessed/performed              Past Medical History:  Diagnosis Date   Allergy    Arthritis    Asthma    Chronic prostatitis    Myalgia    Nose injury, initial encounter 06/16/2020   Pulmonary infiltrates    Scrotal pain 01/08/2023   Sinusitis, chronic    Tonsillar abscess    Vasculitis (HCC)    Past Surgical History:  Procedure Laterality Date   BRONCHOSCOPY  2011   TBBX ----inflammation   IR FLUORO GUIDE CV LINE RIGHT  01/31/2017   IR FLUORO GUIDE CV MIDLINE PICC RIGHT  01/30/2017   IR US  GUIDE VASC ACCESS RIGHT  01/30/2017   peritonsilar abscess     TYMPANOPLASTY     VIDEO BRONCHOSCOPY Bilateral 06/25/2018   Procedure: VIDEO BRONCHOSCOPY WITHOUT FLUORO;  Surgeon: Shellia Oh, MD;  Location: [facility];  Service: Cardiopulmonary;  Laterality: Bilateral;   Patient Active Problem List   Diagnosis Date Noted   Testicular swelling, right 01/08/2023   Radiculitis of left cervical region 12/30/2022   Left lumbar radiculitis 12/30/2022   Osteoporosis 12/30/2022   Long term current use of systemic steroids 05/17/2021   Closed fracture of nasal bones 06/19/2020   Coccygeal pain 06/16/2020   Atherosclerosis of aorta (HCC) 04/12/2019   Pulmonary infiltrates    Medication monitoring encounter 03/03/2018   Pulmonary nodule seen on imaging study 01/15/2018   Microscopic polyangiitis (HCC) 03/14/2016    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   Dyshidrotic eczema 04/08/2014   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 States he has been focusing on gentle massage and some vibration. States he is at the top of his pre-gaba med level and continues to take [employer]. States he was able to do more exercises since last session. States he has limited his sitting to 2-3 hours  Been standing for longer.    Eval: Patient reports intense neck pain that started 1 and half weeks ago.  Patient reports he was fine on Saturday where he went rollerskating and had no falls or issues but the next day his neck/shoulder/arm started to hurt.  Reports he played the guitar which increased his pain on this left side.  Reports he was given prednisone  which did not change his symptoms and was also given gabapentin  but has not started that yet.  Patient has a difficult time getting comfortable and pain is just continuous in nature.  He gets slight relief with immobilization but no relief with anything else he has tried at this time.   Pain radiates into left upper arm and at times into forearm.  Prior to this episode patient was having occasional  symptoms but they would resolve after a short while.  During 1 of these episode he did have some hand tingling but has not had it since.   Patient also has a current T7 compression fracture which was found during a CT scan of his chest, reports he does have pain in his lower thoracic (points to around T12) but his neck is his biggest issue at this time. Patient is right-handed but is ambidextrous and some activities.   PERTINENT HISTORY:  Osteoporosis, Wegener's granulomatosis (long term steroid use), psoriatic arthritis, T7 compression fracture   PAIN:  Are you having pain? 3/10 in the neck radiating down left shoulder blade and into her arm to the elbow Aching, sharp, grabbing  PRECAUTIONS: None   WEIGHT BEARING RESTRICTIONS: No   FALLS:  Has patient fallen in last 6 months? No     OCCUPATION: nuclear physicist   PLOF: Independent   PATIENT GOALS: to have less pain and be able to play guitar       OBJECTIVE:    DIAGNOSTIC FINDINGS: 12/30/22 Xray cervical spine  FINDINGS: There is no evidence of cervical spine fracture or prevertebral soft tissue swelling. Alignment is normal. Mild endplate osteophyte formation is present at C5-C6. No significant neural foraminal stenosis bilaterally. The dens is intact and lateral masses are symmetric.   IMPRESSION: Mild degenerative changes at C5-C6.   Xray Lumbar spine FINDINGS: Alignment is anatomic. Vertebral body and disc space heights are normal. No degenerative changes. No definite pars defects.   IMPRESSION: No findings to explain the patient's pain.   CT chest 2 months ago  Musculoskeletal: Degenerative changes in the spine. Slight compression of the T7 vertebral body, unchanged. No worrisome lytic or sclerotic lesions.     COGNITION: Overall cognitive status: Within functional limits for tasks assessed                                  POSTURE: forward head and flat thoracic spine                    Cervical  AROM:   09/02/23  10/01/23   Flexion  WFL  WFL   Extension WFL Limited and painful   R ROT  WFL Feels better than left  rotation   L ROT  WFL Limited and painful   R SB WFL Limited and painful  L SB WFL Limited and painful                (Blank rows = not tested)                  UE Measurements 10/01/23 -not formally measured or updated on this date secondary to severe levels of pain           Upper Extremity Right 09/02/23 Left 09/02/23 Right   Left      A/PROM MMT A/PROM MMT AROM MMT AROM MMT  Shoulder Flexion 170 4+ 170 4+      Shoulder Extension           Shoulder Abduction  WFL  4+ WFL  5      Shoulder Adduction              Shoulder Internal Rotation Reaches to T6 SP   5  Reaches to T4 SP 5      Shoulder External Rotation Reaches to T4 SP   5 Reaches to T4 SP    5      Elbow Flexion              Elbow Extension              Wrist Flexion              Wrist Extension              Wrist Supination              Wrist Pronation              Wrist Ulnar Deviation              Wrist Radial Deviation              Grip Strength NA   NA                              (Blank rows = not tested)                       * pain in lower back (and in shoulders) - hinges at lower T spine with shoulder flexion    ** increased neuropathy        TODAY'S TREATMENT:                                                                                                                              DATE:   10/28/2023  Therapeutic Exercise: Supine: Belly breathing 5 minutes cues to reduce assessed 3 breathing muscle activation, passive range of motion of left shoulder into internal and external rotation all directions 10 minutes Quad:   Neuromuscular Re-education:  Gait Training:  Self Care: Trigger Point Dry  Needling:  Manual therapy: STM to left pec, shoulder internal and external rotators, anterior scalenes, levator scapula and rhomboids  Thermotherapy   PATIENT EDUCATION:   Education details:on HEP,   Person educated: Patient Education method: Explanation, Demonstration, and Handouts Education comprehension: verbalized understanding       HOME EXERCISE PROGRAM: 3/19 box breathing/pain relief strategies [id number]  - previous with long exhale breathing     ASSESSMENT:   CLINICAL IMPRESSION: 10/28/2023 Tolerated session very well today.  Patient to follow-up with MD later on concerning continued symptoms.  Despite continued symptoms overall pain seems to be managed well on current medication and therapeutic intervention.  Patient still having difficulty with holding his head up throughout the day but has improved with making an active lifestyle change with standing more compared to sitting.  Overall patient is doing well and tolerated manual interventions well.  Will continue with current plan of care as tolerated.  Eval: Patient presents to physical therapy today with severe neck and left upper arm pain that started about 1.5 weeks ago.  Assessment limited secondary to high levels of pain on this date.  Greatest relief felt with cervical traction which patient was educated on as well as other pain management strategies.  Discussed current pain response and importance of addressing pain  to down regulate central nervous system.  Patient severely limited in range of motion, function, sleep and overall quality of life secondary to pain and physical limitations at this time and would greatly benefit from skilled physical therapy.   OBJECTIVE IMPAIRMENTS: decreased activity tolerance, decreased ROM, decreased strength, improper body mechanics, postural dysfunction, and pain.    ACTIVITY LIMITATIONS: lifting, bending, sitting, sleeping, dressing, reach over head, and locomotion level   PARTICIPATION LIMITATIONS: community activity and occupation, playing guitar   PERSONAL FACTORS: 1-2 comorbidities: osteoporosis, vasculitis,  are also affecting patient's functional  outcome.    REHAB POTENTIAL: Good   CLINICAL DECISION MAKING: Evolving/moderate complexity   EVALUATION COMPLEXITY: Moderate     GOALS: Goals reviewed with patient? yes   SHORT TERM GOALS: Target date: 11/26/23 Patient will be independent in self management strategies to improve quality of life and functional outcomes. Baseline: New Program Goal status: MET -previously MET on 09/11/23   2.  Patient will report at least 50% improvement in overall symptoms and/or function to demonstrate improved functional mobility Baseline: 0% better Goal status: MET -previously MET on 09/11/23   3.  Patient will be able to find comfortable position at night to improve ability to get comfortable and fall asleep Baseline: Difficult finding comfortable position Goal status:MET-previously MET on 09/11/23   4.  Patient will demonstrate pain-free cervical range of motion in all directions Baseline: Painful Goal status: MET -previously MET on 09/11/23   5. Patient will demonstrate pain-free cervical range of motion   Current: Painful and unable Goal status: Progressing  6. Patient will patient will be able to sit at work without severe pain to be able to do job responsibilities   Current: Unable very painful Goal status: Progressing  7. Patient will be able to find relief throughout the day different positions or postures   Current: Unable Goal status: Progressing     LONG TERM GOALS: Target date:12/24/23   Patient will report at least 75% improvement in overall symptoms and/or function to demonstrate improved functional mobility Baseline: 0% better Goal status: MET -previously MET on 09/11/23   2.  Patient will be able to play guitar without pain or difficulties to return  to prior level of function Baseline: Painful/difficult Goal status:  MET -previously MET on 09/11/23   3.  Patient report no radicular symptoms in left upper extremity to improve quality of life. Baseline: Current  radicular symptoms into left upper extremity Goal status: MET -previously MET on 09/11/23      4. Patient will report at least 90% improvement in overall symptoms and/or function to demonstrate improved functional mobility   Current: 80% better Goal status: MET -previously MET on 09/11/23  5. 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    Current: unable Goal status: MET  -previously MET on 09/11/23  6. Patient will patient will be able to play drums and guitar without pain or neuropathy in left upper extremity   Current: Unable very painful Goal status: Progressing  7. Patient will report at least 75% improvement overall symptoms since symptoms returned   Current: 0% Goal status: Progressing  8. Patient will be able to perform tasks around farm limitations or severe pain to return to prior level of function   Current: Unable Goal status: Progressing  PLAN:   PT FREQUENCY: 1-2x/week for a total of 16 visits over 12 week certification period   PT DURATION: 12 weeks   PLANNED INTERVENTIONS: Therapeutic exercises, Therapeutic activity, Neuromuscular re-education, Balance training, Gait training, Patient/Family education, Self Care, Joint mobilization, Joint manipulation, Stair training, Canalith repositioning, Orthotic/Fit training, DME instructions, Aquatic Therapy, Dry Needling, Electrical stimulation, Spinal manipulation, Spinal mobilization, Cryotherapy, Moist heat, Traction, Ionotophoresis 4mg /ml Dexamethasone, Manual therapy, and Re-evaluation.   PLAN FOR NEXT SESSION: Update range of motion measurements sleeper stretch on elevated table not in abduction. Strain counterstrain, scapular traction, treat like muscle spasm along left scapular region     12:37 PM, 10/28/23 Olivia Church, DPT Physical Therapy with [facility]

## 2023-10-28 NOTE — Patient Instructions (Addendum)
 Thank you for coming in today.   We will work to authorize [employer] or [name]  Let me know if you would like a shot in your neck or shoulder

## 2023-10-29 ENCOUNTER — Other Ambulatory Visit: Payer: Self-pay

## 2023-10-29 ENCOUNTER — Telehealth: Payer: Self-pay

## 2023-10-29 DIAGNOSIS — M818 Other osteoporosis without current pathological fracture: Secondary | ICD-10-CM

## 2023-10-29 NOTE — Telephone Encounter (Signed)
 Patient eligible for the [facility] [employer] [facility].

## 2023-10-29 NOTE — Telephone Encounter (Signed)
-----   Message from Adron Bene sent at 10/28/2023  1:44 PM EST ----- Regarding: OP Please work to [employer] or [name]

## 2023-10-30 ENCOUNTER — Ambulatory Visit: Payer: Commercial Managed Care - PPO | Admitting: Physical Therapy

## 2023-10-30 ENCOUNTER — Encounter (HOSPITAL_COMMUNITY): Payer: Self-pay

## 2023-10-30 ENCOUNTER — Other Ambulatory Visit (HOSPITAL_COMMUNITY): Payer: Self-pay

## 2023-10-30 ENCOUNTER — Encounter: Payer: Self-pay | Admitting: Physical Therapy

## 2023-10-30 ENCOUNTER — Other Ambulatory Visit (HOSPITAL_COMMUNITY): Payer: Self-pay | Admitting: Pharmacy Technician

## 2023-10-30 DIAGNOSIS — M5412 Radiculopathy, cervical region: Secondary | ICD-10-CM

## 2023-10-30 DIAGNOSIS — M5459 Other low back pain: Secondary | ICD-10-CM | POA: Diagnosis not present

## 2023-10-30 DIAGNOSIS — M6281 Muscle weakness (generalized): Secondary | ICD-10-CM

## 2023-10-30 DIAGNOSIS — M542 Cervicalgia: Secondary | ICD-10-CM | POA: Diagnosis not present

## 2023-10-30 MED ORDER — TYMLOS 3120 MCG/1.56ML ~~LOC~~ SOPN
80.0000 ug | PEN_INJECTOR | Freq: Every day | SUBCUTANEOUS | 11 refills | Status: DC
  Filled 2023-10-30: qty 1.56, fill #0

## 2023-10-30 NOTE — Addendum Note (Signed)
 Addended by: Dierdre Searles on: 10/30/2023 03:51 PM   Modules accepted: Orders

## 2023-10-30 NOTE — Therapy (Signed)
 OUTPATIENT PHYSICAL THERAPY TREATMENT NOTE      Patient Name: MELL GUIA MRN: [id number] DOB:1981/03/04, 43 y.o., male Today's Date: 10/30/2023  PCP: Alvan Dorothyann BIRCH, MD   REFERRING PROVIDER: Curtis Debby JINNY, MD   END OF SESSION:   PT End of Session - 10/30/23 0802     Visit Number 35    Number of Visits 43    Date for PT Re-Evaluation 12/24/23    Progress Note Due on Visit 36    PT Start Time 0803    PT Stop Time 0844    PT Time Calculation (min) 41 min    Activity Tolerance Patient limited by pain    Behavior During Therapy [facility] for tasks assessed/performed              Past Medical History:  Diagnosis Date   Allergy    Arthritis    Asthma    Chronic prostatitis    Myalgia    Nose injury, initial encounter 06/16/2020   Pulmonary infiltrates    Scrotal pain 01/08/2023   Sinusitis, chronic    Tonsillar abscess    Vasculitis (HCC)    Past Surgical History:  Procedure Laterality Date   BRONCHOSCOPY  2011   TBBX ----inflammation   IR FLUORO GUIDE CV LINE RIGHT  01/31/2017   IR FLUORO GUIDE CV MIDLINE PICC RIGHT  01/30/2017   IR US  GUIDE VASC ACCESS RIGHT  01/30/2017   peritonsilar abscess     TYMPANOPLASTY     VIDEO BRONCHOSCOPY Bilateral 06/25/2018   Procedure: VIDEO BRONCHOSCOPY WITHOUT FLUORO;  Surgeon: Shellia Oh, MD;  Location: [facility];  Service: Cardiopulmonary;  Laterality: Bilateral;   Patient Active Problem List   Diagnosis Date Noted   History of vertebral compression fracture 10/28/2023   Testicular swelling, right 01/08/2023   Radiculitis of left cervical region 12/30/2022   Left lumbar radiculitis 12/30/2022   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   Long term current use of systemic steroids 05/17/2021   Closed fracture of nasal bones 06/19/2020   Coccygeal pain 06/16/2020   Atherosclerosis of aorta (HCC) 04/12/2019   Pulmonary infiltrates    Medication monitoring encounter 03/03/2018   Pulmonary nodule seen on  imaging study 01/15/2018   Microscopic polyangiitis (HCC) 03/14/2016   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   Dyshidrotic eczema 04/08/2014   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 Having occasional cramping. Feels like the stretching is helping. States he is doing traction.   Eval: Patient reports intense neck pain that started 1 and half weeks ago.  Patient reports he was fine on Saturday where he went rollerskating and had no falls or issues but the next day his neck/shoulder/arm started to hurt.  Reports he played the guitar which increased his pain on this left side.  Reports he was given prednisone  which did not change his symptoms and was also given gabapentin  but has not started that yet.  Patient has a difficult time getting comfortable and pain is just continuous in nature.  He gets slight relief with immobilization but no relief with anything else he has tried at this time.   Pain radiates into left upper arm and at times into forearm.  Prior to this episode patient was having occasional symptoms but they would resolve after a short while.  During 1 of these episode he did have  some hand tingling but has not had it since.   Patient also has a current T7 compression fracture which was found during a CT scan of his chest, reports he does have pain in his lower thoracic (points to around T12) but his neck is his biggest issue at this time. Patient is right-handed but is ambidextrous and some activities.   PERTINENT HISTORY:  Osteoporosis, Wegener's granulomatosis (long term steroid use), psoriatic arthritis, T7 compression fracture   PAIN:  Are you having pain? 3/10 in the neck radiating down left shoulder blade and into her arm to the elbow Aching, sharp, grabbing  PRECAUTIONS: None   WEIGHT BEARING RESTRICTIONS: No   FALLS:  Has patient fallen in last 6 months? No     OCCUPATION: nuclear physicist   PLOF: Independent   PATIENT GOALS: to have less pain and be able to play guitar       OBJECTIVE:    DIAGNOSTIC FINDINGS: 12/30/22 Xray cervical spine  FINDINGS: There is no evidence of cervical spine fracture or prevertebral soft tissue swelling. Alignment is normal. Mild endplate osteophyte formation is present at C5-C6. No significant neural foraminal stenosis bilaterally. The dens is intact and lateral masses are symmetric.   IMPRESSION: Mild degenerative changes at C5-C6.   Xray Lumbar spine FINDINGS: Alignment is anatomic. Vertebral body and disc space heights are normal. No degenerative changes. No definite pars defects.   IMPRESSION: No findings to explain the patient's pain.   CT chest 2 months ago  Musculoskeletal: Degenerative changes in the spine. Slight compression of the T7 vertebral body, unchanged. No worrisome lytic or sclerotic lesions.     COGNITION: Overall cognitive status: Within functional limits for tasks assessed                                  POSTURE: forward head and flat thoracic spine                   Cervical  AROM:   09/02/23  10/01/23   Flexion  WFL  WFL   Extension WFL Limited and painful    R ROT  WFL Feels better than left rotation   L ROT  WFL Limited and painful   R SB WFL Limited and painful  L SB WFL Limited and painful                (  Blank rows = not tested)                  UE Measurements 10/01/23 -not formally measured or updated on this date secondary to severe levels of pain           Upper Extremity Right 09/02/23 Left 09/02/23 Right   Left      A/PROM MMT A/PROM MMT AROM MMT AROM MMT  Shoulder Flexion 170 4+ 170 4+      Shoulder Extension           Shoulder Abduction  WFL  4+ WFL  5      Shoulder Adduction              Shoulder Internal Rotation Reaches to T6 SP   5  Reaches to T4 SP 5      Shoulder External Rotation Reaches to T4 SP   5 Reaches to T4 SP    5      Elbow Flexion              Elbow Extension              Wrist Flexion              Wrist Extension              Wrist Supination              Wrist Pronation              Wrist Ulnar Deviation              Wrist Radial Deviation              Grip Strength NA   NA                              (Blank rows = not tested)                       * pain in lower back (and in shoulders) - hinges at lower T spine with shoulder flexion    ** increased neuropathy        TODAY'S TREATMENT:                                                                                                                              DATE:   10/30/2023  Therapeutic Exercise: Standing: shoulder IR/ER ROM and activation at different degrees of shoulder abd - 10 minutes, ball circles/up/do/lateral with arm in flexion and shoulder abd - x10 each B, shoulder T position back at wall with shoulrer IR/ER and neck ROT - 6 minutes - tolerated well  See education below   Neuromuscular Re-education:  Gait Training:  Self Care: Trigger Point Dry Needling:  Manual therapy:    Thermotherapy   PATIENT EDUCATION:  Education details:on HEP,  on typical progression  of different conditions, rehab with these different conditions,  progressive nature of some conditions with age factoring in. Discussion of muscle/bone reserve. F/u with PCP for second opinion and addressing all concerns about current presentation.  Person educated: Patient Education method: Explanation, Demonstration, and Handouts Education comprehension: verbalized understanding       HOME EXERCISE PROGRAM: 3/19 box breathing/pain relief strategies [id number]  - previous with long exhale breathing     ASSESSMENT:   CLINICAL IMPRESSION: 10/30/2023 Session focused on addressing questions and concerns about current condition, plan moving forward as well as progressive nature of most conditions secondary to aging and other underlying factors. Discussed benefits of different interventions, importance of optimal bone growth for reserves moving forward and plan to continue to assess symptoms and return to MD if symptoms do not improve in 2 weeks time. Overall tolerated interventions better on this date requiring no manual work. Able to perform neck and shoulder AROM without increase in symptoms during interventions. At end of session slight spasm noted along medial shoulder blade, instructed patient to perform active scapular retraction and hold this position and this reduced the spasm. Overall patient progressing. Will continue to reassess symptoms with under lying conditions and progress interventions as able.   Eval: Patient presents to physical therapy today with severe neck and left upper arm pain that started about 1.5 weeks ago.  Assessment limited secondary to high levels of pain on this date.  Greatest relief felt with cervical traction which patient was educated on as well as other pain management strategies.  Discussed current pain response and importance of addressing pain  to down regulate central nervous system.  Patient severely limited in range of motion, function, sleep and overall quality of life secondary to pain and physical limitations at this time  and would greatly benefit from skilled physical therapy.   OBJECTIVE IMPAIRMENTS: decreased activity tolerance, decreased ROM, decreased strength, improper body mechanics, postural dysfunction, and pain.    ACTIVITY LIMITATIONS: lifting, bending, sitting, sleeping, dressing, reach over head, and locomotion level   PARTICIPATION LIMITATIONS: community activity and occupation, playing guitar   PERSONAL FACTORS: 1-2 comorbidities: osteoporosis, vasculitis,  are also affecting patient's functional outcome.    REHAB POTENTIAL: Good   CLINICAL DECISION MAKING: Evolving/moderate complexity   EVALUATION COMPLEXITY: Moderate     GOALS: Goals reviewed with patient? yes   SHORT TERM GOALS: Target date: 11/26/23 Patient will be independent in self management strategies to improve quality of life and functional outcomes. Baseline: New Program Goal status: MET -previously MET on 09/11/23   2.  Patient will report at least 50% improvement in overall symptoms and/or function to demonstrate improved functional mobility Baseline: 0% better Goal status: MET -previously MET on 09/11/23   3.  Patient will be able to find comfortable position at night to improve ability to get comfortable and fall asleep Baseline: Difficult finding comfortable position Goal status:MET-previously MET on 09/11/23   4.  Patient will demonstrate pain-free cervical range of motion in all directions Baseline: Painful Goal status: MET -previously MET on 09/11/23   5. Patient will demonstrate pain-free cervical range of motion   Current: Painful and unable Goal status: Progressing  6. Patient will patient will be able to sit at work without severe pain to be able to do job responsibilities   Current: Unable very painful Goal status: Progressing  7. Patient will be able to find relief throughout the day different positions or postures   Current: Unable Goal status: Progressing  LONG TERM GOALS: Target  date:12/24/23   Patient will report at least 75% improvement in overall symptoms and/or function to demonstrate improved functional mobility Baseline: 0% better Goal status: MET -previously MET on 09/11/23   2.  Patient will be able to play guitar without pain or difficulties to return to prior level of function Baseline: Painful/difficult Goal status:  MET -previously MET on 09/11/23   3.  Patient report no radicular symptoms in left upper extremity to improve quality of life. Baseline: Current radicular symptoms into left upper extremity Goal status: MET -previously MET on 09/11/23      4. Patient will report at least 90% improvement in overall symptoms and/or function to demonstrate improved functional mobility   Current: 80% better Goal status: MET -previously MET on 09/11/23  5. 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    Current: unable Goal status: MET  -previously MET on 09/11/23  6. Patient will patient will be able to play drums and guitar without pain or neuropathy in left upper extremity   Current: Unable very painful Goal status: Progressing  7. Patient will report at least 75% improvement overall symptoms since symptoms returned   Current: 0% Goal status: Progressing  8. Patient will be able to perform tasks around farm limitations or severe pain to return to prior level of function   Current: Unable Goal status: Progressing  PLAN:   PT FREQUENCY: 1-2x/week for a total of 16 visits over 12 week certification period   PT DURATION: 12 weeks   PLANNED INTERVENTIONS: Therapeutic exercises, Therapeutic activity, Neuromuscular re-education, Balance training, Gait training, Patient/Family education, Self Care, Joint mobilization, Joint manipulation, Stair training, Canalith repositioning, Orthotic/Fit training, DME instructions, Aquatic Therapy, Dry Needling, Electrical  stimulation, Spinal manipulation, Spinal mobilization, Cryotherapy, Moist heat, Traction, Ionotophoresis 4mg /ml Dexamethasone, Manual therapy, and Re-evaluation.   PLAN FOR NEXT SESSION: Update range of motion measurements sleeper stretch on elevated table not in abduction. Strain counterstrain, scapular traction, treat like muscle spasm along left scapular region     9:08 AM, 10/30/23 Olivia Church, DPT Physical Therapy with [facility]

## 2023-10-30 NOTE — Telephone Encounter (Signed)
 Called pt and left VM to call the office.   RX for [name] sent to [facility].

## 2023-10-31 ENCOUNTER — Other Ambulatory Visit: Payer: Self-pay

## 2023-10-31 NOTE — Telephone Encounter (Signed)
 Prior Auth initiated for [facility] via [employer]. PA# [id number]  Pending review

## 2023-11-03 ENCOUNTER — Ambulatory Visit: Payer: Commercial Managed Care - PPO | Admitting: Physical Therapy

## 2023-11-03 ENCOUNTER — Telehealth: Payer: Self-pay | Admitting: Pharmacist

## 2023-11-03 ENCOUNTER — Telehealth (INDEPENDENT_AMBULATORY_CARE_PROVIDER_SITE_OTHER): Payer: Commercial Managed Care - PPO | Admitting: Family Medicine

## 2023-11-03 ENCOUNTER — Encounter: Payer: Self-pay | Admitting: Physical Therapy

## 2023-11-03 ENCOUNTER — Encounter: Payer: Self-pay | Admitting: Family Medicine

## 2023-11-03 VITALS — Wt 198.0 lb

## 2023-11-03 DIAGNOSIS — M5412 Radiculopathy, cervical region: Secondary | ICD-10-CM | POA: Diagnosis not present

## 2023-11-03 DIAGNOSIS — M542 Cervicalgia: Secondary | ICD-10-CM

## 2023-11-03 DIAGNOSIS — M6281 Muscle weakness (generalized): Secondary | ICD-10-CM

## 2023-11-03 DIAGNOSIS — M818 Other osteoporosis without current pathological fracture: Secondary | ICD-10-CM

## 2023-11-03 DIAGNOSIS — M5459 Other low back pain: Secondary | ICD-10-CM | POA: Diagnosis not present

## 2023-11-03 DIAGNOSIS — T380X5A Adverse effect of glucocorticoids and synthetic analogues, initial encounter: Secondary | ICD-10-CM | POA: Diagnosis not present

## 2023-11-03 DIAGNOSIS — Z8781 Personal history of (healed) traumatic fracture: Secondary | ICD-10-CM

## 2023-11-03 NOTE — Therapy (Signed)
 OUTPATIENT PHYSICAL THERAPY TREATMENT NOTE   Progress Note Reporting Period 09/02/23 to 11/03/23  See note below for Objective Data and Assessment of Progress/Goals.       Patient Name: Jesse Wong MRN: [id number] DOB:03-12-1981, 43 y.o., male Today's Date: 11/03/2023  PCP: Alvan Dorothyann BIRCH, MD   REFERRING PROVIDER: Curtis Debby JINNY, MD   END OF SESSION:   PT End of Session - 11/03/23 0844     Visit Number 36    Number of Visits 43    Date for PT Re-Evaluation 12/24/23    Progress Note Due on Visit 36    PT Start Time 0847    PT Stop Time 0927    PT Time Calculation (min) 40 min    Activity Tolerance Patient limited by pain    Behavior During Therapy [facility] for tasks assessed/performed              Past Medical History:  Diagnosis Date   Allergy    Arthritis    Asthma    Chronic prostatitis    Myalgia    Nose injury, initial encounter 06/16/2020   Pulmonary infiltrates    Scrotal pain 01/08/2023   Sinusitis, chronic    Tonsillar abscess    Vasculitis (HCC)    Past Surgical History:  Procedure Laterality Date   BRONCHOSCOPY  2011   TBBX ----inflammation   IR FLUORO GUIDE CV LINE RIGHT  01/31/2017   IR FLUORO GUIDE CV MIDLINE PICC RIGHT  01/30/2017   IR US  GUIDE VASC ACCESS RIGHT  01/30/2017   peritonsilar abscess     TYMPANOPLASTY     VIDEO BRONCHOSCOPY Bilateral 06/25/2018   Procedure: VIDEO BRONCHOSCOPY WITHOUT FLUORO;  Surgeon: Shellia Oh, MD;  Location: [facility];  Service: Cardiopulmonary;  Laterality: Bilateral;   Patient Active Problem List   Diagnosis Date Noted   History of vertebral compression fracture 10/28/2023   Testicular swelling, right 01/08/2023   Radiculitis of left cervical region 12/30/2022   Left lumbar radiculitis 12/30/2022   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   Long term current use of systemic steroids 05/17/2021   Closed fracture of nasal bones 06/19/2020   Coccygeal pain 06/16/2020    Atherosclerosis of aorta (HCC) 04/12/2019   Pulmonary infiltrates    Medication monitoring encounter 03/03/2018   Pulmonary nodule seen on imaging study 01/15/2018   Microscopic polyangiitis (HCC) 03/14/2016   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   Dyshidrotic eczema 04/08/2014   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   MYALGIA 06/28/2010   GERD 06/27/2010   Nonspecific (abnormal) findings on radiological and other examination of body structure 05/29/2010   CT, CHEST, ABNORMAL 05/29/2010   Asthma with bronchitis 04/09/2010   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     THERAPY DIAG:  Cervicalgia  Muscle weakness (generalized)  Other low back pain  Left cervical radiculopathy     REFERRING DIAG:  Left lumbar radiculitis   Rationale for Evaluation and Treatment: Rehabilitation     ONSET DATE: over the weekend   SUBJECTIVE:  SUBJECTIVE STATEMENT: 11/03/2023 Reports he feels like he turned a corner while on medication. Been feeling good since Friday. Minimal neuropathy, not zero, very minimal pain/discomfort. The back of his left arm still talks to him. States he does the traction and the arm exercises. Reports he feels 75 % better since the restart of PT but he does note he is on medication.   Eval: Patient reports intense neck pain that started 1 and half weeks ago.  Patient reports he was fine on Saturday where he went rollerskating and had no falls or issues but the next day his neck/shoulder/arm started to hurt.  Reports he played the guitar which increased his pain on this left side.  Reports he was given prednisone  which did not change his symptoms and was also given gabapentin  but has not started that yet.  Patient has a difficult time getting comfortable and pain is just  continuous in nature.  He gets slight relief with immobilization but no relief with anything else he has tried at this time.   Pain radiates into left upper arm and at times into forearm.  Prior to this episode patient was having occasional symptoms but they would resolve after a short while.  During 1 of these episode he did have some hand tingling but has not had it since.   Patient also has a current T7 compression fracture which was found during a CT scan of his chest, reports he does have pain in his lower thoracic (points to around T12) but his neck is his biggest issue at this time. Patient is right-handed but is ambidextrous and some activities.   PERTINENT HISTORY:  Osteoporosis, Wegener's granulomatosis (long term steroid use), psoriatic arthritis, T7 compression fracture   PAIN:  Are you having pain? 0/10 in the neck radiating down left shoulder blade and into her arm to the elbow Aching, sharp, grabbing  PRECAUTIONS: None   WEIGHT BEARING RESTRICTIONS: No   FALLS:  Has patient fallen in last 6 months? No     OCCUPATION: nuclear physicist   PLOF: Independent   PATIENT GOALS: to have less pain and be able to play guitar       OBJECTIVE:    DIAGNOSTIC FINDINGS: 12/30/22 Xray cervical spine  FINDINGS: There is no evidence of cervical spine fracture or prevertebral soft tissue swelling. Alignment is normal. Mild endplate osteophyte formation is present at C5-C6. No significant neural foraminal stenosis bilaterally. The dens is intact and lateral masses are symmetric.   IMPRESSION: Mild degenerative changes at C5-C6.   Xray Lumbar spine FINDINGS: Alignment is anatomic. Vertebral body and disc space heights are normal. No degenerative changes. No definite pars defects.   IMPRESSION: No findings to explain the patient's pain.   CT chest 2 months ago  Musculoskeletal: Degenerative changes in the spine. Slight compression of the T7 vertebral body, unchanged. No  worrisome lytic or sclerotic lesions.     COGNITION: Overall cognitive status: Within functional limits for tasks assessed                                  POSTURE: forward head and flat thoracic spine                   Cervical  AROM:   09/02/23  10/01/23 11/03/23   Flexion  WFL  WFL 30   Extension WFL Limited and painful 30*   R ROT  WFL  Feels better than left rotation 65   L ROT  WFL Limited and painful 80*   R SB WFL Limited and painful WFL  L SB WFL Limited and painful WFL                (Blank rows = not tested)                  UE Measurements             Upper Extremity Right 09/02/23 Left 09/02/23 Right  11/03/23 Left  11/03/23    A/PROM MMT A/PROM MMT AROM MMT AROM MMT  Shoulder Flexion 170 4+ 170 4+ WFL 4+ WFL 4  Shoulder Extension           Shoulder Abduction  WFL  4+ WFL  5 WFL 4+ WFL 4  Shoulder Adduction              Shoulder Internal Rotation Reaches to T6 SP   5  Reaches to T4 SP 5 Reaches to T6 SP  4 Reaches to T4 SP  4  Shoulder External Rotation Reaches to T4 SP   5 Reaches to T4 SP    5 Reaches to T4 SP  4- Reaches to T4 SP  4-  Elbow Flexion              Elbow Extension              Wrist Flexion              Wrist Extension              Wrist Supination              Wrist Pronation              Wrist Ulnar Deviation              Wrist Radial Deviation              Grip Strength NA   NA                              (Blank rows = not tested)                       * pain in lower back (and in shoulders) - hinges at lower T spine with shoulder flexion    ** increased neuropathy        TODAY'S TREATMENT:                                                                                                                              DATE:   11/03/2023  Therapeutic Exercise: Quad: neck ROT/FLEX/EXT - 15 minutes, primal planks x5 5 holds Objective  measures updated Standing: halo with ball 2x15 B, scarecrow  x25 B at wall  Neuromuscular  Re-education:  Gait Training:  Self Care: Trigger Point Dry Needling:  Manual therapy:    Thermotherapy   PATIENT EDUCATION:  Education details:on HEP,  on typical progression of different conditions, rehab with these different conditions, progressive nature of some conditions with age factoring in. Discussion of muscle/bone reserve. F/u with PCP for second opinion and addressing all concerns about current presentation.  Person educated: Patient Education method: Explanation, Demonstration, and Handouts Education comprehension: verbalized understanding       HOME EXERCISE PROGRAM: 3/19 box breathing/pain relief strategies [id number]  - previous with long exhale breathing     ASSESSMENT:   CLINICAL IMPRESSION: 11/03/2023  Session focused on updated objective measures. Patient is progressing towards goals but is still on medication. Progressed exercises and answered all questions. Overall patient is doing well and will continue to benefit from skilled PT at this time.  Eval: Patient presents to physical therapy today with severe neck and left upper arm pain that started about 1.5 weeks ago.  Assessment limited secondary to high levels of pain on this date.  Greatest relief felt with cervical traction which patient was educated on as well as other pain management strategies.  Discussed current pain response and importance of addressing pain  to down regulate central nervous system.  Patient severely limited in range of motion, function, sleep and overall quality of life secondary to pain and physical limitations at this time and would greatly benefit from skilled physical therapy.   OBJECTIVE IMPAIRMENTS: decreased activity tolerance, decreased ROM, decreased strength, improper body mechanics, postural dysfunction, and pain.    ACTIVITY LIMITATIONS: lifting, bending, sitting, sleeping, dressing, reach over head, and locomotion level   PARTICIPATION LIMITATIONS: community activity and  occupation, playing guitar   PERSONAL FACTORS: 1-2 comorbidities: osteoporosis, vasculitis,  are also affecting patient's functional outcome.    REHAB POTENTIAL: Good   CLINICAL DECISION MAKING: Evolving/moderate complexity   EVALUATION COMPLEXITY: Moderate     GOALS: Goals reviewed with patient? yes   SHORT TERM GOALS: Target date: 11/26/23 Patient will be independent in self management strategies to improve quality of life and functional outcomes. Baseline: New Program Goal status: MET -previously MET on 09/11/23   2.  Patient will report at least 50% improvement in overall symptoms and/or function to demonstrate improved functional mobility Baseline: 0% better Goal status: MET -previously MET on 09/11/23   3.  Patient will be able to find comfortable position at night to improve ability to get comfortable and fall asleep Baseline: Difficult finding comfortable position Goal status:MET-previously MET on 09/11/23   4.  Patient will demonstrate pain-free cervical range of motion in all directions Baseline: Painful Goal status: MET -previously MET on 09/11/23   5. Patient will demonstrate pain-free cervical range of motion   Current: Painful and unable Goal status: Progressing  6. Patient will patient will be able to sit at work without severe pain to be able to do job responsibilities   Current: Unable very painful Goal status: Progressing- last couple days while on medication   7. Patient will be able to find relief throughout the day different positions or postures   Current: Unable Goal status: Progressing - met while on medication     LONG TERM GOALS: Target date:12/24/23   Patient will report at least 75% improvement in overall symptoms and/or function to demonstrate improved functional mobility Baseline: 0% better Goal status: MET -previously MET on 09/11/23   2.  Patient will be able to play guitar without pain  or difficulties to return to prior level of  function Baseline: Painful/difficult Goal status:  MET -previously MET on 09/11/23   3.  Patient report no radicular symptoms in left upper extremity to improve quality of life. Baseline: Current radicular symptoms into left upper extremity Goal status: MET -previously MET on 09/11/23      4. Patient will report at least 90% improvement in overall symptoms and/or function to demonstrate improved functional mobility   Current: 80% better Goal status: MET -previously MET on 09/11/23  5. 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    Current: unable Goal status: MET  -previously MET on 09/11/23  6. Patient will patient will be able to play drums and guitar without pain or neuropathy in left upper extremity   Current: Unable very painful Goal status: Progressing  7. Patient will report at least 75% improvement overall symptoms since symptoms returned   Current: 0% Goal status: MET  8. Patient will be able to perform tasks around farm limitations or severe pain to return to prior level of function   Current: Unable Goal status: Progressing  PLAN:   PT FREQUENCY: 1-2x/week for a total of 16 visits over 12 week certification period   PT DURATION: 12 weeks   PLANNED INTERVENTIONS: Therapeutic exercises, Therapeutic activity, Neuromuscular re-education, Balance training, Gait training, Patient/Family education, Self Care, Joint mobilization, Joint manipulation, Stair training, Canalith repositioning, Orthotic/Fit training, DME instructions, Aquatic Therapy, Dry Needling, Electrical stimulation, Spinal manipulation, Spinal mobilization, Cryotherapy, Moist heat, Traction, Ionotophoresis 4mg /ml Dexamethasone, Manual therapy, and Re-evaluation.   PLAN FOR NEXT SESSION: sleeper stretch on elevated table not in abduction. Strain counterstrain, scapular traction, treat like muscle spasm along left scapular  region     9:30 AM, 11/03/23 Olivia Church, DPT Physical Therapy with [place]

## 2023-11-03 NOTE — Progress Notes (Signed)
    Virtual Visit via Video Note  I connected with Jesse Wong on 11/04/23 at  1:20 PM EST by a video enabled telemedicine application and verified that I am speaking with the correct person using two identifiers.   I discussed the limitations of evaluation and management by telemedicine and the availability of in person appointments. The patient expressed understanding and agreed to proceed.  Patient location: at home Provider location: in office  Subjective:    CC:  No chief complaint on file.   HPI:  Pt wanted to discuss bone building medication for his osteopenia.  (Forteo or Tymlos ).   Pt is also seeing Endo for consultation as well.   Found 2 prior vertebral fracture with T score o -2.4. 30 session of PT this year. Was getting better and lifted some heavy paving bricks. Tried to rest for 2 wks and didn't get better. Saw Dr. Joane and discussed possible epidural injection. Pain is getting better more recently.  But wanted to discuss options Dr. Margart had mentioned possibly getting on a bone builder for a treatment course.  So he had some additional questions about that today.  We had previously had discussions about Prolia..    Past medical history, Surgical history, Family history not pertinant except as noted below, Social history, Allergies, and medications have been entered into the medical record, reviewed, and corrections made.    Objective:    General: Speaking clearly in complete sentences without any shortness of breath.  Alert and oriented x3.  Normal judgment. No apparent acute distress.    Impression and Recommendations:    Problem List Items Addressed This Visit       Musculoskeletal and Integument   Steroid-induced osteoporosis - Primary   Discussed bone builder followed by maintenance therapy (sequential therapy) .  PTH analog vs Prolia vs bisphosphonate.   Tymolos only showed 7% improvement spin and 2% hip in men but significant dec in major  fracture.   Recommend keep consult with endocrine to discuss current options.  I am happy to follow/treat from there.         History of vertebral compression fracture    No orders of the defined types were placed in this encounter.   No orders of the defined types were placed in this encounter.    I discussed the assessment and treatment plan with the patient. The patient was provided an opportunity to ask questions and all were answered. The patient agreed with the plan and demonstrated an understanding of the instructions.   The patient was advised to call back or seek an in-person evaluation if the symptoms worsen or if the condition fails to improve as anticipated.   Dorothyann Byars, MD

## 2023-11-03 NOTE — Progress Notes (Signed)
 Pt wanted to discuss bone building medication and  he would like a second opinion about the medication.  (Forteo or Tymlos).   Pt is also seeing Endo.

## 2023-11-03 NOTE — Telephone Encounter (Signed)
 Called patient to schedule an appointment for the [facility]. I was unable to reach the patient so I left a HIPAA-compliant message requesting that the patient return my call.   Jesse Wong, PharmD, JAQUELINE, CPP Clinical Pharmacist [facility] [phone]

## 2023-11-04 ENCOUNTER — Other Ambulatory Visit: Payer: Self-pay

## 2023-11-04 NOTE — Assessment & Plan Note (Signed)
 Discussed bone builder followed by maintenance therapy (sequential therapy) .  PTH analog vs Prolia vs bisphosphonate.   Tymolos only showed 7% improvement spin and 2% hip in men but significant dec in major fracture.   Recommend keep consult with endocrine to discuss current options.  I am happy to follow/treat from there.

## 2023-11-04 NOTE — Telephone Encounter (Signed)
 Patient called back.  Can you call him at your convenience?

## 2023-11-05 ENCOUNTER — Other Ambulatory Visit: Payer: Self-pay

## 2023-11-05 NOTE — Telephone Encounter (Signed)
 Called pt and advised that Tymlos  has been approved, of [facility], and that rx has been sent to the pharmacy. Pt verbalized understanding. He spoke with his PCP who recommended consulting with his Endocrinologist before starting on Tymlos . Pt has scheduled appointment with Endo for 2nd opinion. He plans to proceed with Tymlos  if Endo has no objections. Dr. Alease Hunter made verbally aware.

## 2023-11-05 NOTE — Telephone Encounter (Signed)
 Prior Auth for Tymlos  APPROVED PA# [id number] Valid: 11/04/23-10/24/25  [facility]

## 2023-11-06 ENCOUNTER — Encounter: Payer: Self-pay | Admitting: Physical Therapy

## 2023-11-06 ENCOUNTER — Ambulatory Visit: Payer: Commercial Managed Care - PPO | Admitting: Physical Therapy

## 2023-11-06 DIAGNOSIS — M542 Cervicalgia: Secondary | ICD-10-CM | POA: Diagnosis not present

## 2023-11-06 DIAGNOSIS — M6281 Muscle weakness (generalized): Secondary | ICD-10-CM | POA: Diagnosis not present

## 2023-11-06 DIAGNOSIS — M5459 Other low back pain: Secondary | ICD-10-CM

## 2023-11-06 DIAGNOSIS — M5412 Radiculopathy, cervical region: Secondary | ICD-10-CM

## 2023-11-06 NOTE — Therapy (Signed)
OUTPATIENT PHYSICAL THERAPY TREATMENT NOTE         Patient Name: Jesse Wong MRN: [id number] DOB:18-Nov-1980, 43 y.o., male Today's Date: 11/06/2023  PCP: Agapito Games, MD   REFERRING PROVIDER: Monica Becton, MD   END OF SESSION:   PT End of Session - 11/06/23 1516     Visit Number 37    Number of Visits 43    Date for PT Re-Evaluation 12/24/23    Progress Note Due on Visit 47    PT Start Time 1517    PT Stop Time 1555    PT Time Calculation (min) 38 min    Activity Tolerance Patient limited by pain    Behavior During Therapy [facility] for tasks assessed/performed              Past Medical History:  Diagnosis Date   Allergy    Arthritis    Asthma    Chronic prostatitis    Myalgia    Nose injury, initial encounter 06/16/2020   Pulmonary infiltrates    Scrotal pain 01/08/2023   Sinusitis, chronic    Tonsillar abscess    Vasculitis (HCC)    Past Surgical History:  Procedure Laterality Date   BRONCHOSCOPY  2011   TBBX ----inflammation   IR FLUORO GUIDE CV LINE RIGHT  01/31/2017   IR FLUORO GUIDE CV MIDLINE PICC RIGHT  01/30/2017   IR US GUIDE VASC ACCESS RIGHT  01/30/2017   peritonsilar abscess     TYMPANOPLASTY     VIDEO BRONCHOSCOPY Bilateral 06/25/2018   Procedure: VIDEO BRONCHOSCOPY WITHOUT FLUORO;  Surgeon: Coralyn Helling, MD;  Location: [facility];  Service: Cardiopulmonary;  Laterality: Bilateral;   Patient Active Problem List   Diagnosis Date Noted   History of vertebral compression fracture 10/28/2023   Testicular swelling, right 01/08/2023   Radiculitis of left cervical region 12/30/2022   Left lumbar radiculitis 12/30/2022   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   Long term current use of systemic steroids 05/17/2021   Closed fracture of nasal bones 06/19/2020   Coccygeal pain 06/16/2020   Atherosclerosis of aorta (HCC) 04/12/2019   Pulmonary infiltrates    Medication monitoring encounter 03/03/2018   Pulmonary nodule  seen on imaging study 01/15/2018   Microscopic polyangiitis (HCC) 03/14/2016   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   Dyshidrotic eczema 04/08/2014   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 State she has a level 2 pain and is stretching his shoulder really well.    Eval: Patient reports intense neck pain that started 1 and half weeks ago.  Patient reports he was fine on Saturday where he went rollerskating and had no falls or issues but the next day his neck/shoulder/arm started to hurt.  Reports he played the guitar which increased his pain on this left side.  Reports he was given prednisone which did not change his symptoms and was also given gabapentin but has not started that yet.  Patient has a difficult time getting comfortable and pain is just continuous in nature.  He gets slight relief with immobilization but no relief with anything else he has tried at this time.   Pain radiates into left upper arm and at times into forearm.  Prior to this episode patient was having occasional symptoms but they would resolve after a short while.  During 1 of these episode he did have some hand tingling but has not had it  since.   Patient also has a current T7 compression fracture which was found during a CT scan of his chest, reports he does have pain in his lower thoracic (points to around T12) but his neck is his biggest issue at this time. Patient is right-handed but is ambidextrous and some activities.   PERTINENT HISTORY:  Osteoporosis, Wegener's granulomatosis (long term steroid use), psoriatic arthritis, T7 compression fracture   PAIN:  Are you having pain? 2/10 in the neck radiating down left shoulder blade and into her arm to the elbow Aching, sharp, grabbing  PRECAUTIONS: None   WEIGHT BEARING RESTRICTIONS: No   FALLS:  Has patient fallen in last 6 months? No     OCCUPATION: nuclear physicist   PLOF: Independent   PATIENT GOALS: to have less pain and be able to play guitar       OBJECTIVE:    DIAGNOSTIC FINDINGS: 12/30/22 Xray cervical spine  FINDINGS: There is no evidence of cervical spine fracture or prevertebral soft tissue swelling. Alignment is normal. Mild endplate osteophyte formation is present at C5-C6. No significant neural foraminal stenosis bilaterally. The dens is intact and lateral masses are symmetric.   IMPRESSION: Mild degenerative changes at C5-C6.   Xray Lumbar spine FINDINGS: Alignment is anatomic. Vertebral body and disc space heights are normal. No degenerative changes. No definite pars defects.   IMPRESSION: No findings to explain the patient's pain.   CT chest 2 months ago  Musculoskeletal: Degenerative changes in the spine. Slight compression of the T7 vertebral body, unchanged. No worrisome lytic or sclerotic lesions.     COGNITION: Overall cognitive status: Within functional limits for tasks assessed                                  POSTURE: forward head and flat thoracic spine                   Cervical  AROM:   09/02/23  10/01/23 11/03/23   Flexion  WFL  WFL 30   Extension WFL Limited and painful 30*   R ROT  WFL Feels  better than left rotation 65   L ROT  WFL Limited and painful 80*   R SB WFL Limited and painful WFL  L SB WFL Limited and painful WFL                (  Blank rows = not tested)                  UE Measurements             Upper Extremity Right 09/02/23 Left 09/02/23 Right  11/03/23 Left  11/03/23    A/PROM MMT A/PROM MMT AROM MMT AROM MMT  Shoulder Flexion 170 4+ 170 4+ WFL 4+ WFL 4  Shoulder Extension           Shoulder Abduction  WFL  4+ WFL  5 WFL 4+ WFL 4  Shoulder Adduction              Shoulder Internal Rotation Reaches to T6 SP   5  Reaches to T4 SP 5 Reaches to T6 SP  4 Reaches to T4 SP  4  Shoulder External Rotation Reaches to T4 SP   5 Reaches to T4 SP    5 Reaches to T4 SP  4- Reaches to T4 SP  4-  Elbow Flexion              Elbow Extension              Wrist Flexion              Wrist Extension              Wrist Supination              Wrist Pronation              Wrist Ulnar Deviation              Wrist Radial Deviation              Grip Strength NA   NA                              (Blank rows = not tested)                       * pain in lower back (and in shoulders) - hinges at lower T spine with shoulder flexion    ** increased neuropathy        TODAY'S TREATMENT:                                                                                                                              DATE:   11/06/2023  Therapeutic Exercise: Quad: neck ROT/FLEX/EXT - 5 minutes, Review of HEP Passive range of motion of neck in supine     Neuromuscular Re-education:  Gait Training:  Self Care: Trigger Point Dry Needling:  Manual therapy: STM to left cervical musculature, grade 2 medial mobilizations to cervical spine on left tolerated well  Thermotherapy   PATIENT EDUCATION:  Education details:on HEP, Person educated: Patient Education method: Explanation, Demonstration, and Handouts Education comprehension: verbalized understanding  HOME EXERCISE  PROGRAM: 3/19 box breathing/pain relief strategies [id number]  - previous with long exhale breathing     ASSESSMENT:   CLINICAL IMPRESSION: 11/06/2023 Focused on manual interventions of the cervical spine on this date.  Increased pain during mobilization but significantly reduced pain afterwards.  Educated patient in gentle self mobilization of the cervical musculature.  Overall patient doing very well and will continue benefit from skilled PT at this time.  Eval: Patient presents to physical therapy today with severe neck and left upper arm pain that started about 1.5 weeks ago.  Assessment limited secondary to high levels of pain on this date.  Greatest relief felt with cervical traction which patient was educated on as well as other pain management strategies.  Discussed current pain response and importance of addressing pain  to down regulate central nervous system.  Patient severely limited in range of motion, function, sleep and overall quality of life secondary to pain and physical limitations at this time and would greatly benefit from skilled physical therapy.   OBJECTIVE IMPAIRMENTS: decreased activity tolerance, decreased ROM, decreased strength, improper body mechanics, postural dysfunction, and pain.    ACTIVITY LIMITATIONS: lifting, bending, sitting, sleeping, dressing, reach over head, and locomotion level   PARTICIPATION LIMITATIONS: community activity and occupation, playing guitar   PERSONAL FACTORS: 1-2 comorbidities: osteoporosis, vasculitis,  are also affecting patient's functional outcome.    REHAB POTENTIAL: Good   CLINICAL DECISION MAKING: Evolving/moderate complexity   EVALUATION COMPLEXITY: Moderate     GOALS: Goals reviewed with patient? yes   SHORT TERM GOALS: Target date: 11/26/23 Patient will be independent in self management strategies to improve quality of life and functional outcomes. Baseline: New Program Goal status: MET -previously MET on 09/11/23    2.  Patient will report at least 50% improvement in overall symptoms and/or function to demonstrate improved functional mobility Baseline: 0% better Goal status: MET -previously MET on 09/11/23   3.  Patient will be able to find comfortable position at night to improve ability to get comfortable and fall asleep Baseline: Difficult finding comfortable position Goal status:MET-previously MET on 09/11/23   4.  Patient will demonstrate pain-free cervical range of motion in all directions Baseline: Painful Goal status: MET -previously MET on 09/11/23   5. Patient will demonstrate pain-free cervical range of motion   Current: Painful and unable Goal status: Progressing  6. Patient will patient will be able to sit at work without severe pain to be able to do job responsibilities   Current: Unable very painful Goal status: Progressing- last couple days while on medication   7. Patient will be able to find relief throughout the day different positions or postures   Current: Unable Goal status: Progressing - met while on medication     LONG TERM GOALS: Target date:12/24/23   Patient will report at least 75% improvement in overall symptoms and/or function to demonstrate improved functional mobility Baseline: 0% better Goal status: MET -previously MET on 09/11/23   2.  Patient will be able to play guitar without pain or difficulties to return to prior level of function Baseline: Painful/difficult Goal status:  MET -previously MET on 09/11/23   3.  Patient report no radicular symptoms in left upper extremity to improve quality of life. Baseline: Current radicular symptoms into left upper extremity Goal status: MET -previously MET on 09/11/23      4. Patient will report at least 90% improvement in overall symptoms and/or function to demonstrate improved functional mobility  Current: 80% better Goal status: MET -previously MET on 09/11/23  5. 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    Current: unable Goal status: MET  -previously MET on 09/11/23  6. Patient will patient will be able to play drums and guitar without pain or neuropathy in left upper extremity   Current: Unable very painful Goal status: Progressing  7. Patient will report at least 75% improvement overall symptoms since symptoms returned   Current: 0% Goal status: MET  8. Patient will be able to perform tasks around farm limitations or severe pain to return to prior level of function   Current: Unable Goal status: Progressing  PLAN:   PT FREQUENCY: 1-2x/week for a total of 16 visits over 12 week certification period   PT DURATION: 12 weeks   PLANNED INTERVENTIONS: Therapeutic exercises, Therapeutic activity, Neuromuscular re-education, Balance training, Gait training, Patient/Family education, Self Care, Joint mobilization, Joint manipulation, Stair training, Canalith repositioning, Orthotic/Fit training, DME instructions, Aquatic Therapy, Dry Needling, Electrical stimulation, Spinal manipulation, Spinal mobilization, Cryotherapy, Moist heat, Traction, Ionotophoresis 4mg /ml Dexamethasone, Manual therapy, and Re-evaluation.   PLAN FOR NEXT SESSION: sleeper stretch on elevated table not in abduction. Strain counterstrain, scapular traction, treat like muscle spasm along left scapular region     4:02 PM, 11/06/23 Tereasa Coop, DPT Physical Therapy with [facility]

## 2023-11-07 ENCOUNTER — Encounter: Payer: Self-pay | Admitting: "Endocrinology

## 2023-11-07 ENCOUNTER — Encounter: Payer: Self-pay | Admitting: Internal Medicine

## 2023-11-07 ENCOUNTER — Encounter: Payer: Self-pay | Admitting: Oncology

## 2023-11-07 ENCOUNTER — Telehealth (INDEPENDENT_AMBULATORY_CARE_PROVIDER_SITE_OTHER): Payer: Commercial Managed Care - PPO | Admitting: "Endocrinology

## 2023-11-07 ENCOUNTER — Other Ambulatory Visit: Payer: Self-pay

## 2023-11-07 VITALS — Ht 79.0 in | Wt 198.0 lb

## 2023-11-07 DIAGNOSIS — M818 Other osteoporosis without current pathological fracture: Secondary | ICD-10-CM | POA: Diagnosis not present

## 2023-11-07 NOTE — Progress Notes (Signed)
The patient reports they are currently: [place]. I spent 14-15 minutes on the video with the patient on the date of service. I spent an additional 10 minutes on pre- and post-visit activities on the date of service.   The patient was physically located in [place] or a state in which I am permitted to provide care. The patient and/or parent/guardian understood that s/he may incur co-pays and cost sharing, and agreed to the telemedicine visit. The visit was reasonable and appropriate under the circumstances given the patient's presentation at the time.  The patient and/or parent/guardian has been advised of the potential risks and limitations of this mode of treatment (including, but not limited to, the absence of in-person examination) and has agreed to be treated using telemedicine. The patient's/patient's family's questions regarding telemedicine have been answered.   The patient and/or parent/guardian has also been advised to contact their provider's office for worsening conditions, and seek emergency medical treatment and/or call 911 if the patient deems either necessary.     [facility] Note Jesse Ryan, MD    Referring Provider: Agapito Wong, * Primary Care Provider: Agapito Games, MD Chief Complaint  Patient presents with   Follow-up    Discuss starting a new medication for bone building , per Pcp  and sport med doctor   Assessment & Plan  Jesse "BJ" was seen today for follow-up.  Diagnoses and all orders for this visit:  Other osteoporosis without current pathological fracture -     Renal function panel -     VITAMIN D 25 Hydroxy (Vit-D Deficiency, Fractures)   Osteoporosis Likely secondary cause from steroids intake. 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 Has used fosamax years ago for 2 years. Started fosamax on 03/19/23 with some GERD but didn't want reclast. Taking Cardomom pods for acid reflux  as needed. Discussed other treatment options as well including teriparatide, prolia and reclast. 04/2022 The BMD measured at Femur Neck Left is 0.716 g/cm2 with a Z-score of -2.4. Recommend to use calcium carbonate 600 mg twice daily and vitamin D 2000 units OTC supplements.  Recommend weight bearing exercise options and dietary supplements.   Interested in learning more about about teriparatide , discussed in detail and shared handout No increased risk of osteosarcoma/ metabolic bone diseases including Paget's disease/bone metastases or history of skeletal malignancies  Next DXA in 05/2024  Educated on risks and side effects of fosamax including but not limited to esophagitis, worsening GERD, atypical femoral fractures and osteonecrosis of the jaw. Advised to take medication first thing in the morning with plenty of water and stay upright for 30 minutes after taking the medication.  Advised fall precautions, adequate dairy in diet and exercises (aerobic, balancing and weight bearing) as tolerated.  Fine hand tremor noted B/L No thyroid labs seen Baseline thyroid labs WNL    No follow-ups on file.  I have reviewed current medications, nurse's notes, allergies, vital signs, past medical and surgical history, family medical history, and social history for this encounter. Counseled patient on symptoms, examination findings, lab findings, imaging results, treatment decisions and monitoring and prognosis. The patient understood the recommendations and agrees with the treatment plan. All questions regarding treatment plan were fully answered.   Jesse [place], MD   11/07/23   History of Present Illness Jesse Wong is a 43 y.o. year old male who presents to our clinic with osteoporosis diagnosed in 2015. He is currently taking Calcium and  vitamin D 1000 international units daily.  Went to sports medicine Going to physical therapy for spine and neck No new falls/fracture Taking fosamax  weekly without issues On Vit D 2000 units every day Taking Calcium  Interested in learning more about about teriparatide    Risk Factors screening:  History of low trauma fractures: No Family history of osteoporosis: Yes Hip fracture in first-degree relatives: No Smoking history: No Excessive alcohol intake >2 drinks/day: No Excessive caffeine intake >2 drinks/day: No Glucocorticoid use >5mg  prednisone/day for >3 months: Yes: has ANCA vasculitis, had 1 gm solumedrol previously   Rheumatoid arthritis history: No   Physical Exam  Ht 6\' 7"  (2.007 m)   Wt 198 lb (89.8 kg)   BMI 22.31 kg/m  Constitutional: well developed, well nourished Head: normocephalic, atraumatic Eyes: sclera anicteric, no redness Neck: supple, no thyromegaly/thyroid tenderness/thyroid nodule  Lungs: normal respiratory effort Neurology: alert and oriented Skin: dry, no appreciable rashes Musculoskeletal: no appreciable defects, + B/L find hand tremor  Psychiatric: normal mood and affect  Allergies Allergies  Allergen Reactions   Gabapentin Other (See Comments)    Tinnitis, and hearing loss   Lipitor [Atorvastatin] Other (See Comments)    myalgias   Ruxience [Rituximab-Pvvr] Itching    Itching in back of throat and nose     Current Medications Patient's Medications  New Prescriptions   No medications on file  Previous Medications   ABALOPARATIDE (TYMLOS) 3120 MCG/1.56ML SOPN    Inject 80 mcg into the skin daily.   ALBUTEROL (PROVENTIL) (2.5 MG/3ML) 0.083% NEBULIZER SOLUTION    Inhale 1 vial via nebulizer every 6 hours as needed for wheezing.   ALBUTEROL (VENTOLIN HFA) 108 (90 BASE) MCG/ACT INHALER    Inhale 2 puffs into the lungs every 6 (six) hours as needed for wheezing or shortness of breath.   ALENDRONATE (FOSAMAX) 70 MG TABLET    Take 1 tablet (70 mg total) by mouth every 7 (seven) days. Take with a full glass of water on an empty stomach.   AZELASTINE HCL (ASTEPRO) 0.15 % SOLN    Place 1  spray into the nose 2 (two) times daily.   BUDESONIDE-FORMOTEROL (SYMBICORT) 160-4.5 MCG/ACT INHALER    Inhale 2 puffs into the lungs 2 (two) times daily.   CALCIUM PO    Take 1 tablet by mouth 2 (two) times daily.   CHOLECALCIFEROL (VITAMIN D) 1000 UNITS TABLET    Take 1 tablet (1,000 Units total) by mouth daily.   FLUTICASONE (FLONASE) 50 MCG/ACT NASAL SPRAY    INSTILL 2 SPRAYS INTO BOTH NOSTRILS DAILY (NEED APPT FOR FURTHER REFILLS)   MULTIPLE VITAMIN (MULTIVITAMIN WITH MINERALS) TABS TABLET    Take 1 tablet by mouth 2 (two) times daily.   MUPIROCIN OINTMENT (BACTROBAN) 2 %    Use with nasal rinse bottle   NAPROXEN (NAPROSYN) 500 MG TABLET    Take 1 tablet (500 mg total) by mouth 2 (two) times daily with a meal.   NEBULIZERS (COMPRESSOR/NEBULIZER) MISC    Use as direccted   OMEPRAZOLE (PRILOSEC) 20 MG CAPSULE    Take 1 capsule (20 mg total) by mouth daily.   PRAVASTATIN (PRAVACHOL) 40 MG TABLET    Take 1 tablet by mouth daily.   PREGABALIN (LYRICA) 50 MG CAPSULE    Take 1 capsule (50 mg total) by mouth every evening for 7 days, THEN 1 capsule (50 mg total) 2 (two) times daily for 7 days, THEN 1 capsule (50 mg total) 3 (three) times  daily   SODIUM CHLORIDE HYPERTONIC 3 % NEBULIZER SOLUTION    Take 3 mLs by nebulization 3 (three) times daily.  Modified Medications   No medications on file  Discontinued Medications   No medications on file     Past Medical History Past Medical History:  Diagnosis Date   Allergy    Arthritis    Asthma    Chronic prostatitis    Myalgia    Nose injury, initial encounter 06/16/2020   Pulmonary infiltrates    Scrotal pain 01/08/2023   Sinusitis, chronic    Tonsillar abscess    Vasculitis (HCC)     Past Surgical History Past Surgical History:  Procedure Laterality Date   BRONCHOSCOPY  2011   TBBX ----inflammation   IR FLUORO GUIDE CV LINE RIGHT  01/31/2017   IR FLUORO GUIDE CV MIDLINE PICC RIGHT  01/30/2017   IR US GUIDE VASC ACCESS RIGHT  01/30/2017    peritonsilar abscess     TYMPANOPLASTY     VIDEO BRONCHOSCOPY Bilateral 06/25/2018   Procedure: VIDEO BRONCHOSCOPY WITHOUT FLUORO;  Surgeon: Coralyn Helling, MD;  Location: [facility];  Service: Cardiopulmonary;  Laterality: Bilateral;    Family History family history includes Heart attack in an other family member; Heart disease in his paternal grandfather; Hyperlipidemia in his father; Thyroid disease in his father.  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  Other Topics Concern   Not on file  Social History Narrative   Not on file   Social Drivers of Health   Financial Resource Strain: Not on file  Food Insecurity: Not on file  Transportation Needs: Not on file  Physical Activity: Insufficiently Active (06/04/2023)   Exercise Vital Sign    Days of Exercise per Week: 7 days    Minutes of Exercise per Session: 20 min  Stress: Not on file  Social Connections: Not on file  Intimate Partner Violence: Not on file    Laboratory Investigations No components found for: "CMP" No components found for: "BMP" Lab Results  Component Value Date   GFR 89.06 08/01/2023   Lab Results  Component Value Date   CREATININE 1.03 08/01/2023   No results found for: "CBC" No components found for: "LFT" No components found for: "VITD" Lab Results  Component Value Date   PTH 18 03/13/2023   Lab Results  Component Value Date   TSH 3.520  06/18/2023   No components found for: "RENAL FUNCTION" No components found for: "MAGNESIUM"  Parts of this note may have been dictated using voice recognition software. There may be variances in spelling and vocabulary which are unintentional. Not all errors are proofread. Please notify the Thereasa Parkin if any discrepancies are noted or if the meaning of any statement is not clear.  Physical Exam  Ht 6\' 7"  (2.007 m)   Wt 198 lb (89.8 kg)  BMI 22.31 kg/m    Constitutional: well developed, well nourished Head: normocephalic, atraumatic Eyes: sclera anicteric, no redness Neck: supple Lungs: normal respiratory effort Neurology: alert and oriented Skin: dry, no appreciable rashes Musculoskeletal: no appreciable defects Psychiatric: normal mood and affect   Current Medications Patient's Medications  New Prescriptions   No medications on file  Previous Medications   ABALOPARATIDE (TYMLOS) 3120 MCG/1.56ML SOPN    Inject 80 mcg into the skin daily.   ALBUTEROL (PROVENTIL) (2.5 MG/3ML) 0.083% NEBULIZER SOLUTION    Inhale 1 vial via nebulizer every 6 hours as needed for wheezing.   ALBUTEROL (VENTOLIN HFA) 108 (90 BASE) MCG/ACT INHALER    Inhale 2 puffs into the lungs every 6 (six) hours as needed for wheezing or shortness of breath.   ALENDRONATE (FOSAMAX) 70 MG TABLET    Take 1 tablet (70 mg total) by mouth every 7 (seven) days. Take with a full glass of water on an empty stomach.   AZELASTINE HCL (ASTEPRO) 0.15 % SOLN    Place 1 spray into the nose 2 (two) times daily.   BUDESONIDE-FORMOTEROL (SYMBICORT) 160-4.5 MCG/ACT INHALER    Inhale 2 puffs into the lungs 2 (two) times daily.   CALCIUM PO    Take 1 tablet by mouth 2 (two) times daily.   CHOLECALCIFEROL (VITAMIN D) 1000 UNITS TABLET    Take 1 tablet (1,000 Units total) by mouth daily.   FLUTICASONE (FLONASE) 50 MCG/ACT NASAL SPRAY    INSTILL 2 SPRAYS INTO BOTH NOSTRILS DAILY (NEED APPT FOR FURTHER REFILLS)   MULTIPLE VITAMIN (MULTIVITAMIN  WITH MINERALS) TABS TABLET    Take 1 tablet by mouth 2 (two) times daily.   MUPIROCIN OINTMENT (BACTROBAN) 2 %    Use with nasal rinse bottle   NAPROXEN (NAPROSYN) 500 MG TABLET    Take 1 tablet (500 mg total) by mouth 2 (two) times daily with a meal.   NEBULIZERS (COMPRESSOR/NEBULIZER) MISC    Use as direccted   OMEPRAZOLE (PRILOSEC) 20 MG CAPSULE    Take 1 capsule (20 mg total) by mouth daily.   PRAVASTATIN (PRAVACHOL) 40 MG TABLET    Take 1 tablet by mouth daily.   PREGABALIN (LYRICA) 50 MG CAPSULE    Take 1 capsule (50 mg total) by mouth every evening for 7 days, THEN 1 capsule (50 mg total) 2 (two) times daily for 7 days, THEN 1 capsule (50 mg total) 3 (three) times daily   SODIUM CHLORIDE HYPERTONIC 3 % NEBULIZER SOLUTION    Take 3 mLs by nebulization 3 (three) times daily.  Modified Medications   No medications on file  Discontinued Medications   No medications on file    Allergies Allergies  Allergen Reactions   Gabapentin Other (See Comments)    Tinnitis, and hearing loss   Lipitor [Atorvastatin] Other (See Comments)    myalgias   Ruxience [Rituximab-Pvvr] Itching    Itching in back of throat and nose     Past Medical History Past Medical History:  Diagnosis Date   Allergy    Arthritis    Asthma    Chronic prostatitis    Myalgia    Nose injury, initial encounter 06/16/2020   Pulmonary infiltrates    Scrotal pain 01/08/2023   Sinusitis, chronic    Tonsillar abscess    Vasculitis (HCC)     Past Surgical History Past Surgical History:  Procedure Laterality Date   BRONCHOSCOPY  2011   TBBX ----inflammation   IR FLUORO GUIDE CV  LINE RIGHT  01/31/2017   IR FLUORO GUIDE CV MIDLINE PICC RIGHT  01/30/2017   IR US GUIDE VASC ACCESS RIGHT  01/30/2017   peritonsilar abscess     TYMPANOPLASTY     VIDEO BRONCHOSCOPY Bilateral 06/25/2018   Procedure: VIDEO BRONCHOSCOPY WITHOUT FLUORO;  Surgeon: Coralyn Helling, MD;  Location: [facility];  Service: Cardiopulmonary;  Laterality:  Bilateral;    Family History family history includes Heart attack in an other family member; Heart disease in his paternal grandfather; Hyperlipidemia in his father; Thyroid disease in his father.  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  Other Topics Concern   Not on file  Social History Narrative   Not on file   Social Drivers of Health   Financial Resource Strain: Not on file  Food Insecurity: Not on file  Transportation Needs: Not on file  Physical Activity: Insufficiently Active (06/04/2023)   Exercise Vital Sign    Days of Exercise per Week: 7 days    Minutes of Exercise per Session: 20 min  Stress: Not on file  Social Connections: Not on file  Intimate Partner Violence: Not on file    Lab Results  Component Value Date   CHOL 183 06/04/2023   Lab Results  Component Value Date   HDL 39 (L) 06/04/2023   Lab Results  Component Value Date   LDLCALC 116 (H) 06/04/2023   Lab Results  Component Value Date   TRIG 155 (H) 06/04/2023   Lab Results  Component Value Date   CHOLHDL 4.2 05/09/2022   Lab Results  Component Value Date   CREATININE 1.03 08/01/2023   Lab Results  Component Value Date   GFR 89.06 08/01/2023      Component Value Date/Time   NA 140 08/01/2023 1431   NA 140 06/04/2023 0951   K 3.7 08/01/2023 1431   CL 102 08/01/2023 1431   CL 95 02/03/2017 0000   CO2 30 08/01/2023 1431   CO2 25 02/03/2017  0000   GLUCOSE 159 (H) 08/01/2023 1431   BUN 16 08/01/2023 1431   BUN 14 06/04/2023 0951   CREATININE 1.03 08/01/2023 1431   CREATININE 0.90 10/24/2022 1337   CALCIUM 9.7 08/01/2023 1431   CALCIUM 9.7 02/03/2017 0000   PROT 7.2 08/01/2023 1431   PROT 7.3 06/04/2023 0951   ALBUMIN 4.7 08/01/2023 1431   ALBUMIN 4.8 06/04/2023 0951   AST 52 (H) 08/01/2023 1431   ALT 49 08/01/2023 1431   ALKPHOS 65 08/01/2023 1431   BILITOT 0.7 08/01/2023 1431   BILITOT 0.8 06/04/2023 0951   GFRNONAA >60 12/04/2020 0848   GFRNONAA 108 06/16/2020 1708   GFRAA 125 06/16/2020 1708      Latest Ref Rng & Units 08/01/2023    2:31 PM 06/04/2023    9:51 AM 03/13/2023    3:43 PM  BMP  Glucose 70 - 99 mg/dL 409  89  80   BUN 6 - 23 mg/dL 16  14  21    Creatinine 0.40 - 1.50 mg/dL 8.11  9.14  7.82   BUN/Creat Ratio 9 - 20  14    Sodium 135 - 145 mEq/L 140  140  142   Potassium 3.5 - 5.1 mEq/L 3.7  4.5  4.3   Chloride 96 - 112 mEq/L 102  101  103   CO2 19 - 32 mEq/L 30  26  32   Calcium 8.4 - 10.5 mg/dL 9.7  9.7  9.5    9.4        Component Value Date/Time   WBC 5.9 08/15/2023 0847   WBC 9.0 08/01/2023 1431   RBC 5.37 08/15/2023 0847   RBC 5.74 08/01/2023 1431   HGB 16.7 08/15/2023 0847   HCT 50.3 08/15/2023 0847   PLT 267 08/15/2023 0847   MCV 94 08/15/2023 0847   MCH 31.1 08/15/2023 0847   MCH 31.6 10/24/2022 1337   MCHC 33.2 08/15/2023 0847   MCHC 33.8 08/01/2023 1431   RDW 12.7 08/15/2023 0847   LYMPHSABS 1.3 08/15/2023 0847   MONOABS 0.2 08/01/2023 1431   EOSABS 0.3 08/15/2023 0847   BASOSABS 0.1 08/15/2023 0847   Lab Results  Component Value Date   TSH 3.520 06/18/2023   TSH 3.75 04/12/2019   TSH 1.76 06/20/2016   FREET4 1.31 06/18/2023         Parts of this note may have been dictated using voice recognition software. There may be variances in spelling and vocabulary which are unintentional. Not all errors are proofread. Please notify the There[facility] Parkin if any discrepancies are noted or  if the meaning of any statement is not clear.

## 2023-11-10 ENCOUNTER — Ambulatory Visit: Payer: Commercial Managed Care - PPO | Admitting: Physical Therapy

## 2023-11-10 ENCOUNTER — Other Ambulatory Visit: Payer: Self-pay

## 2023-11-10 ENCOUNTER — Encounter: Payer: Self-pay | Admitting: Physical Therapy

## 2023-11-10 DIAGNOSIS — M542 Cervicalgia: Secondary | ICD-10-CM

## 2023-11-10 DIAGNOSIS — M6281 Muscle weakness (generalized): Secondary | ICD-10-CM

## 2023-11-10 DIAGNOSIS — M5412 Radiculopathy, cervical region: Secondary | ICD-10-CM | POA: Diagnosis not present

## 2023-11-10 DIAGNOSIS — M5459 Other low back pain: Secondary | ICD-10-CM | POA: Diagnosis not present

## 2023-11-10 NOTE — Therapy (Signed)
OUTPATIENT PHYSICAL THERAPY TREATMENT NOTE         Patient Name: Jesse Wong MRN: [id number] DOB:[birth year], 43 y.o., male Today's Date: 11/10/2023  PCP: Agapito Games, MD   REFERRING PROVIDER: Monica Becton, MD   END OF SESSION:   PT End of Session - 11/10/23 1300     Visit Number 38    Number of Visits 43    Date for PT Re-Evaluation 12/24/23    Progress Note Due on Visit 47    PT Start Time 1300    PT Stop Time 1342    PT Time Calculation (min) 42 min    Activity Tolerance Patient limited by pain    Behavior During Therapy [facility] for tasks assessed/performed              Past Medical History:  Diagnosis Date   Allergy    Arthritis    Asthma    Chronic prostatitis    Myalgia    Nose injury, initial encounter 06/16/2020   Pulmonary infiltrates    Scrotal pain 01/08/2023   Sinusitis, chronic    Tonsillar abscess    Vasculitis (HCC)    Past Surgical History:  Procedure Laterality Date   BRONCHOSCOPY  2011   TBBX ----inflammation   IR FLUORO GUIDE CV LINE RIGHT  01/31/2017   IR FLUORO GUIDE CV MIDLINE PICC RIGHT  01/30/2017   IR US GUIDE VASC ACCESS RIGHT  01/30/2017   peritonsilar abscess     TYMPANOPLASTY     VIDEO BRONCHOSCOPY Bilateral 06/25/2018   Procedure: VIDEO BRONCHOSCOPY WITHOUT FLUORO;  Surgeon: Coralyn Helling, MD;  Location: [facility];  Service: Cardiopulmonary;  Laterality: Bilateral;   Patient Active Problem List   Diagnosis Date Noted   History of vertebral compression fracture 10/28/2023   Testicular swelling, right 01/08/2023   Radiculitis of left cervical region 12/30/2022   Left lumbar radiculitis 12/30/2022   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   Long term current use of systemic steroids 05/17/2021   Closed fracture of nasal bones 06/19/2020   Coccygeal pain 06/16/2020   Atherosclerosis of aorta (HCC) 04/12/2019   Pulmonary infiltrates    Medication monitoring encounter 03/03/2018   Pulmonary nodule  seen on imaging study 01/15/2018   Microscopic polyangiitis (HCC) 03/14/2016   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   Dyshidrotic eczema 04/08/2014   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   MYALGIA 06/28/2010   GERD 06/27/2010   Nonspecific (abnormal) findings on radiological and other examination of body structure 05/29/2010   CT, CHEST, ABNORMAL 05/29/2010   Asthma with bronchitis 04/09/2010   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     THERAPY DIAG:  Cervicalgia  Muscle weakness (generalized)  Other low back pain  Left cervical radiculopathy     REFERRING DIAG:  Left lumbar radiculitis   Rationale for Evaluation and Treatment: Rehabilitation     ONSET DATE: over the weekend   SUBJECTIVE:  SUBJECTIVE STATEMENT: 11/10/2023 States that after the last session the back of the arm pain has been significantly better. States he has been working the muscles of the neck too.    Eval: Patient reports intense neck pain that started 1 and half weeks ago.  Patient reports he was fine on Saturday where he went rollerskating and had no falls or issues but the next day his neck/shoulder/arm started to hurt.  Reports he played the guitar which increased his pain on this left side.  Reports he was given prednisone which did not change his symptoms and was also given gabapentin but has not started that yet.  Patient has a difficult time getting comfortable and pain is just continuous in nature.  He gets slight relief with immobilization but no relief with anything else he has tried at this time.   Pain radiates into left upper arm and at times into forearm.  Prior to this episode patient was having occasional symptoms but they would resolve after a short while.  During 1 of these episode he did have  some hand tingling but has not had it since.   Patient also has a current T7 compression fracture which was found during a CT scan of his chest, reports he does have pain in his lower thoracic (points to around T12) but his neck is his biggest issue at this time. Patient is right-handed but is ambidextrous and some activities.   PERTINENT HISTORY:  Osteoporosis, Wegener's granulomatosis (long term steroid use), psoriatic arthritis, T7 compression fracture   PAIN:  Are you having pain? 2/10 in the neck radiating down left shoulder blade and into her arm to the elbow Aching, sharp, grabbing  PRECAUTIONS: None   WEIGHT BEARING RESTRICTIONS: No   FALLS:  Has patient fallen in last 6 months? No     OCCUPATION: nuclear physicist   PLOF: Independent   PATIENT GOALS: to have less pain and be able to play guitar       OBJECTIVE:    DIAGNOSTIC FINDINGS: 12/30/22 Xray cervical spine  FINDINGS: There is no evidence of cervical spine fracture or prevertebral soft tissue swelling. Alignment is normal. Mild endplate osteophyte formation is present at C5-C6. No significant neural foraminal stenosis bilaterally. The dens is intact and lateral masses are symmetric.   IMPRESSION: Mild degenerative changes at C5-C6.   Xray Lumbar spine FINDINGS: Alignment is anatomic. Vertebral body and disc space heights are normal. No degenerative changes. No definite pars defects.   IMPRESSION: No findings to explain the patient's pain.   CT chest 2 months ago  Musculoskeletal: Degenerative changes in the spine. Slight compression of the T7 vertebral body, unchanged. No worrisome lytic or sclerotic lesions.     COGNITION: Overall cognitive status: Within functional limits for tasks assessed                                  POSTURE: forward head and flat thoracic spine                   Cervical  AROM:   09/02/23  10/01/23 11/03/23   Flexion  WFL  WFL 30   Extension WFL Limited and  painful 30*   R ROT  WFL Feels better than left rotation 65   L ROT  WFL Limited and painful 80*   R SB WFL Limited and painful WFL  L SB WFL Limited and painful WFL                (  Blank rows = not tested)                  UE Measurements             Upper Extremity Right 09/02/23 Left 09/02/23 Right  11/03/23 Left  11/03/23    A/PROM MMT A/PROM MMT AROM MMT AROM MMT  Shoulder Flexion 170 4+ 170 4+ WFL 4+ WFL 4  Shoulder Extension           Shoulder Abduction  WFL  4+ WFL  5 WFL 4+ WFL 4  Shoulder Adduction              Shoulder Internal Rotation Reaches to T6 SP   5  Reaches to T4 SP 5 Reaches to T6 SP  4 Reaches to T4 SP  4  Shoulder External Rotation Reaches to T4 SP   5 Reaches to T4 SP    5 Reaches to T4 SP  4- Reaches to T4 SP  4-  Elbow Flexion              Elbow Extension              Wrist Flexion              Wrist Extension              Wrist Supination              Wrist Pronation              Wrist Ulnar Deviation              Wrist Radial Deviation              Grip Strength NA   NA                              (Blank rows = not tested)                       * pain in lower back (and in shoulders) - hinges at lower T spine with shoulder flexion    ** increased neuropathy        TODAY'S TREATMENT:                                                                                                                              DATE:   11/10/2023  Therapeutic Exercise:   Passive range of motion of neck in supine- in neutral and flexion of spine, PROM of left shoulder - tolerated well      Neuromuscular Re-education:  Gait Training:  Self Care: Trigger Point Dry Needling:  Manual therapy: STM to left cervical musculature, grade 2 medial mobilizations to cervical spine on left tolerated well, STM to left infraspinatus and triceps  Thermotherapy   PATIENT EDUCATION:  Education details:on HEP, Person educated: Patient Education method:  Explanation,  Demonstration, and Handouts Education comprehension: verbalized understanding       HOME EXERCISE PROGRAM: 3/19 box breathing/pain relief strategies [id number]  - previous with long exhale breathing     ASSESSMENT:   CLINICAL IMPRESSION: 11/10/2023 Continued to focus on manual interventions which were tolerated well. Discussed possibility of weaning from medications slowly but to monitor symptoms and know triggers. Answered all questions and overall patient felt relief end of session. Will continue with current POC as tolerated. Reviewed HEP prior to end of session.   Eval: Patient presents to physical therapy today with severe neck and left upper arm pain that started about 1.5 weeks ago.  Assessment limited secondary to high levels of pain on this date.  Greatest relief felt with cervical traction which patient was educated on as well as other pain management strategies.  Discussed current pain response and importance of addressing pain  to down regulate central nervous system.  Patient severely limited in range of motion, function, sleep and overall quality of life secondary to pain and physical limitations at this time and would greatly benefit from skilled physical therapy.   OBJECTIVE IMPAIRMENTS: decreased activity tolerance, decreased ROM, decreased strength, improper body mechanics, postural dysfunction, and pain.    ACTIVITY LIMITATIONS: lifting, bending, sitting, sleeping, dressing, reach over head, and locomotion level   PARTICIPATION LIMITATIONS: community activity and occupation, playing guitar   PERSONAL FACTORS: 1-2 comorbidities: osteoporosis, vasculitis,  are also affecting patient's functional outcome.    REHAB POTENTIAL: Good   CLINICAL DECISION MAKING: Evolving/moderate complexity   EVALUATION COMPLEXITY: Moderate     GOALS: Goals reviewed with patient? yes   SHORT TERM GOALS: Target date: 11/26/23 Patient will be independent in self management strategies to  improve quality of life and functional outcomes. Baseline: New Program Goal status: MET -previously MET on 09/11/23   2.  Patient will report at least 50% improvement in overall symptoms and/or function to demonstrate improved functional mobility Baseline: 0% better Goal status: MET -previously MET on 09/11/23   3.  Patient will be able to find comfortable position at night to improve ability to get comfortable and fall asleep Baseline: Difficult finding comfortable position Goal status:MET-previously MET on 09/11/23   4.  Patient will demonstrate pain-free cervical range of motion in all directions Baseline: Painful Goal status: MET -previously MET on 09/11/23   5. Patient will demonstrate pain-free cervical range of motion   Current: Painful and unable Goal status: Progressing  6. Patient will patient will be able to sit at work without severe pain to be able to do job responsibilities   Current: Unable very painful Goal status: Progressing- last couple days while on medication   7. Patient will be able to find relief throughout the day different positions or postures   Current: Unable Goal status: Progressing - met while on medication     LONG TERM GOALS: Target date:12/24/23   Patient will report at least 75% improvement in overall symptoms and/or function to demonstrate improved functional mobility Baseline: 0% better Goal status: MET -previously MET on 09/11/23   2.  Patient will be able to play guitar without pain or difficulties to return to prior level of function Baseline: Painful/difficult Goal status:  MET -previously MET on 09/11/23   3.  Patient report no radicular symptoms in left upper extremity to improve quality of life. Baseline: Current radicular symptoms into left upper extremity Goal status: MET -previously MET on 09/11/23      4. Patient will  report at least 90% improvement in overall symptoms and/or function to demonstrate improved functional  mobility   Current: 80% better Goal status: MET -previously MET on 09/11/23  5. 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    Current: unable Goal status: MET  -previously MET on 09/11/23  6. Patient will patient will be able to play drums and guitar without pain or neuropathy in left upper extremity   Current: Unable very painful Goal status: Progressing  7. Patient will report at least 75% improvement overall symptoms since symptoms returned   Current: 0% Goal status: MET  8. Patient will be able to perform tasks around farm limitations or severe pain to return to prior level of function   Current: Unable Goal status: Progressing  PLAN:   PT FREQUENCY: 1-2x/week for a total of 16 visits over 12 week certification period   PT DURATION: 12 weeks   PLANNED INTERVENTIONS: Therapeutic exercises, Therapeutic activity, Neuromuscular re-education, Balance training, Gait training, Patient/Family education, Self Care, Joint mobilization, Joint manipulation, Stair training, Canalith repositioning, Orthotic/Fit training, DME instructions, Aquatic Therapy, Dry Needling, Electrical stimulation, Spinal manipulation, Spinal mobilization, Cryotherapy, Moist heat, Traction, Ionotophoresis 4mg /ml Dexamethasone, Manual therapy, and Re-evaluation.   PLAN FOR NEXT SESSION: sleeper stretch on elevated table not in abduction. Strain counterstrain, scapular traction, treat like muscle spasm along left scapular region     1:48 PM, 11/10/23 Tereasa Coop, DPT Physical Therapy with [facility]

## 2023-11-11 ENCOUNTER — Encounter: Payer: Self-pay | Admitting: Family Medicine

## 2023-11-11 ENCOUNTER — Other Ambulatory Visit: Payer: Self-pay

## 2023-11-11 NOTE — Progress Notes (Signed)
Patient has decided to not fill Tymlos right now. He is seeking out another opinion. He will call if he decides to fill. Dis-enrolling at this time.

## 2023-11-11 NOTE — Progress Notes (Signed)
Pharmacy Patient Advocate Encounter  Insurance verification completed.   The patient is insured through [facility]   Ran test claim for Tymlos. Currently a quantity of 1.56 mls is a 30 day supply and the co-pay is $461.  Patient has Tymlos copay card-$0  PA was completed by [facility].

## 2023-11-12 ENCOUNTER — Encounter: Payer: Self-pay | Admitting: Family Medicine

## 2023-11-12 ENCOUNTER — Ambulatory Visit: Payer: Commercial Managed Care - PPO | Admitting: Physical Therapy

## 2023-11-12 ENCOUNTER — Encounter: Payer: Self-pay | Admitting: Physical Therapy

## 2023-11-12 DIAGNOSIS — M5412 Radiculopathy, cervical region: Secondary | ICD-10-CM | POA: Diagnosis not present

## 2023-11-12 DIAGNOSIS — M542 Cervicalgia: Secondary | ICD-10-CM

## 2023-11-12 DIAGNOSIS — M6281 Muscle weakness (generalized): Secondary | ICD-10-CM

## 2023-11-12 DIAGNOSIS — M5459 Other low back pain: Secondary | ICD-10-CM | POA: Diagnosis not present

## 2023-11-12 NOTE — Therapy (Signed)
OUTPATIENT PHYSICAL THERAPY TREATMENT NOTE         Patient Name: Jesse Wong MRN: [id number] DOB:1981/07/16, 43 y.o., male Today's Date: 11/12/2023  PCP: Agapito Games, MD   REFERRING PROVIDER: Monica Becton, MD   END OF SESSION:   PT End of Session - 11/12/23 1346     Visit Number 39    Number of Visits 43    Date for PT Re-Evaluation 12/24/23    Progress Note Due on Visit 47    PT Start Time 1347    PT Stop Time 1428    PT Time Calculation (min) 41 min    Activity Tolerance Patient limited by pain    Behavior During Therapy [facility] for tasks assessed/performed              Past Medical History:  Diagnosis Date   Allergy    Arthritis    Asthma    Chronic prostatitis    Myalgia    Nose injury, initial encounter 06/16/2020   Pulmonary infiltrates    Scrotal pain 01/08/2023   Sinusitis, chronic    Tonsillar abscess    Vasculitis (HCC)    Past Surgical History:  Procedure Laterality Date   BRONCHOSCOPY  2011   TBBX ----inflammation   IR FLUORO GUIDE CV LINE RIGHT  01/31/2017   IR FLUORO GUIDE CV MIDLINE PICC RIGHT  01/30/2017   IR US GUIDE VASC ACCESS RIGHT  01/30/2017   peritonsilar abscess     TYMPANOPLASTY     VIDEO BRONCHOSCOPY Bilateral 06/25/2018   Procedure: VIDEO BRONCHOSCOPY WITHOUT FLUORO;  Surgeon: Coralyn Helling, MD;  Location: [facility];  Service: Cardiopulmonary;  Laterality: Bilateral;   Patient Active Problem List   Diagnosis Date Noted   History of vertebral compression fracture 10/28/2023   Testicular swelling, right 01/08/2023   Radiculitis of left cervical region 12/30/2022   Left lumbar radiculitis 12/30/2022   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   Long term current use of systemic steroids 05/17/2021   Closed fracture of nasal bones 06/19/2020   Coccygeal pain 06/16/2020   Atherosclerosis of aorta (HCC) 04/12/2019   Pulmonary infiltrates    Medication monitoring encounter 03/03/2018   Pulmonary nodule  seen on imaging study 01/15/2018   Microscopic polyangiitis (HCC) 03/14/2016   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   Dyshidrotic eczema 04/08/2014   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   MYALGIA 06/28/2010   GERD 06/27/2010   Nonspecific (abnormal) findings on radiological and other examination of body structure 05/29/2010   CT, CHEST, ABNORMAL 05/29/2010   Asthma with bronchitis 04/09/2010   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     THERAPY DIAG:  Cervicalgia  Muscle weakness (generalized)  Other low back pain  Left cervical radiculopathy     REFERRING DIAG:  Left lumbar radiculitis   Rationale for Evaluation and Treatment: Rehabilitation     ONSET DATE: over the weekend   SUBJECTIVE:  SUBJECTIVE STATEMENT: 11/12/2023 States that he backed down on his NSAIDs and his neck and arm is doing well. States that he was getting some spasms along the left side of his trunk.   Eval: Patient reports intense neck pain that started 1 and half weeks ago.  Patient reports he was fine on Saturday where he went rollerskating and had no falls or issues but the next day his neck/shoulder/arm started to hurt.  Reports he played the guitar which increased his pain on this left side.  Reports he was given prednisone which did not change his symptoms and was also given gabapentin but has not started that yet.  Patient has a difficult time getting comfortable and pain is just continuous in nature.  He gets slight relief with immobilization but no relief with anything else he has tried at this time.   Pain radiates into left upper arm and at times into forearm.  Prior to this episode patient was having occasional symptoms but they would resolve after a short while.  During 1 of these episode he did have  some hand tingling but has not had it since.   Patient also has a current T7 compression fracture which was found during a CT scan of his chest, reports he does have pain in his lower thoracic (points to around T12) but his neck is his biggest issue at this time. Patient is right-handed but is ambidextrous and some activities.   PERTINENT HISTORY:  Osteoporosis, Wegener's granulomatosis (long term steroid use), psoriatic arthritis, T7 compression fracture   PAIN:  Are you having pain? 2/10 in the neck and back on the left side Aching, sharp, grabbing  PRECAUTIONS: None   WEIGHT BEARING RESTRICTIONS: No   FALLS:  Has patient fallen in last 6 months? No     OCCUPATION: nuclear physicist   PLOF: Independent   PATIENT GOALS: to have less pain and be able to play guitar       OBJECTIVE:    DIAGNOSTIC FINDINGS: 12/30/22 Xray cervical spine  FINDINGS: There is no evidence of cervical spine fracture or prevertebral soft tissue swelling. Alignment is normal. Mild endplate osteophyte formation is present at C5-C6. No significant neural foraminal stenosis bilaterally. The dens is intact and lateral masses are symmetric.   IMPRESSION: Mild degenerative changes at C5-C6.   Xray Lumbar spine FINDINGS: Alignment is anatomic. Vertebral body and disc space heights are normal. No degenerative changes. No definite pars defects.   IMPRESSION: No findings to explain the patient's pain.   CT chest 2 months ago  Musculoskeletal: Degenerative changes in the spine. Slight compression of the T7 vertebral body, unchanged. No worrisome lytic or sclerotic lesions.     COGNITION: Overall cognitive status: Within functional limits for tasks assessed                                  POSTURE: forward head and flat thoracic spine                   Cervical  AROM:   09/02/23  10/01/23 11/03/23   Flexion  WFL  WFL 30   Extension WFL Limited and painful 30*   R ROT  WFL Feels better  than left rotation 65   L ROT  WFL Limited and painful 80*   R SB WFL Limited and painful WFL  L SB WFL Limited and painful WFL                (  Blank rows = not tested)                  UE Measurements             Upper Extremity Right 09/02/23 Left 09/02/23 Right  11/03/23 Left  11/03/23    A/PROM MMT A/PROM MMT AROM MMT AROM MMT  Shoulder Flexion 170 4+ 170 4+ WFL 4+ WFL 4  Shoulder Extension           Shoulder Abduction  WFL  4+ WFL  5 WFL 4+ WFL 4  Shoulder Adduction              Shoulder Internal Rotation Reaches to T6 SP   5  Reaches to T4 SP 5 Reaches to T6 SP  4 Reaches to T4 SP  4  Shoulder External Rotation Reaches to T4 SP   5 Reaches to T4 SP    5 Reaches to T4 SP  4- Reaches to T4 SP  4-  Elbow Flexion              Elbow Extension              Wrist Flexion              Wrist Extension              Wrist Supination              Wrist Pronation              Wrist Ulnar Deviation              Wrist Radial Deviation              Grip Strength NA   NA                              (Blank rows = not tested)                       * pain in lower back (and in shoulders) - hinges at lower T spine with shoulder flexion    ** increased neuropathy        TODAY'S TREATMENT:                                                                                                                              DATE:   11/12/2023  Therapeutic Exercise: Seated unilateral LAT pulldown with green band x 30 slowing controlled, LAT stretch with PT overpressure for arms into external rotation x 25 Supine self mobilization with mobilization wedge 6 minutes Standing LAT stretch with arms on counter x 15     Neuromuscular Re-education:  Gait Training:  Self Care: Trigger Point Dry Needling:  Manual therapy: STM to left LAT muscle and shortened in length and position while in sitting 10 minutes  Thermotherapy   PATIENT  EDUCATION:  Education details:on HEP, anatomy and rationale for each  intervention Person educated: Patient Education method: Explanation, Demonstration, and Handouts Education comprehension: verbalized understanding       HOME EXERCISE PROGRAM: 3/19 box breathing/pain relief strategies [id number]  - previous with long exhale breathing     ASSESSMENT:   CLINICAL IMPRESSION: 11/12/2023 Session focused on activating and reducing spasms in left latissimus dorsi.  Tolerated PT assisted stretch well and able to replicate with standing stretch at counter.  Added lengthening and strengthening exercise with green Thera-Band with unilateral LAT pulldown.  Slight muscle spasms noted after this exercise.  Perform soft tissue mobilization and this is significantly improved.  Additionally added self mobilization to cervical spine with use of mobilization wedge.  This was tolerated well.  Educated patient in safe use of mobilization wedge.  Will continue with current plan of care as tolerated.  Eval: Patient presents to physical therapy today with severe neck and left upper arm pain that started about 1.5 weeks ago.  Assessment limited secondary to high levels of pain on this date.  Greatest relief felt with cervical traction which patient was educated on as well as other pain management strategies.  Discussed current pain response and importance of addressing pain  to down regulate central nervous system.  Patient severely limited in range of motion, function, sleep and overall quality of life secondary to pain and physical limitations at this time and would greatly benefit from skilled physical therapy.   OBJECTIVE IMPAIRMENTS: decreased activity tolerance, decreased ROM, decreased strength, improper body mechanics, postural dysfunction, and pain.    ACTIVITY LIMITATIONS: lifting, bending, sitting, sleeping, dressing, reach over head, and locomotion level   PARTICIPATION LIMITATIONS: community activity and occupation, playing guitar   PERSONAL FACTORS: 1-2 comorbidities:  osteoporosis, vasculitis,  are also affecting patient's functional outcome.    REHAB POTENTIAL: Good   CLINICAL DECISION MAKING: Evolving/moderate complexity   EVALUATION COMPLEXITY: Moderate     GOALS: Goals reviewed with patient? yes   SHORT TERM GOALS: Target date: 11/26/23 Patient will be independent in self management strategies to improve quality of life and functional outcomes. Baseline: New Program Goal status: MET -previously MET on 09/11/23   2.  Patient will report at least 50% improvement in overall symptoms and/or function to demonstrate improved functional mobility Baseline: 0% better Goal status: MET -previously MET on 09/11/23   3.  Patient will be able to find comfortable position at night to improve ability to get comfortable and fall asleep Baseline: Difficult finding comfortable position Goal status:MET-previously MET on 09/11/23   4.  Patient will demonstrate pain-free cervical range of motion in all directions Baseline: Painful Goal status: MET -previously MET on 09/11/23   5. Patient will demonstrate pain-free cervical range of motion   Current: Painful and unable Goal status: Progressing  6. Patient will patient will be able to sit at work without severe pain to be able to do job responsibilities   Current: Unable very painful Goal status: Progressing- last couple days while on medication   7. Patient will be able to find relief throughout the day different positions or postures   Current: Unable Goal status: Progressing - met while on medication     LONG TERM GOALS: Target date:12/24/23   Patient will report at least 75% improvement in overall symptoms and/or function to demonstrate improved functional mobility Baseline: 0% better Goal status: MET -previously MET on 09/11/23   2.  Patient will be able to play guitar without pain  or difficulties to return to prior level of function Baseline: Painful/difficult Goal status:  MET -previously MET on  09/11/23   3.  Patient report no radicular symptoms in left upper extremity to improve quality of life. Baseline: Current radicular symptoms into left upper extremity Goal status: MET -previously MET on 09/11/23      4. Patient will report at least 90% improvement in overall symptoms and/or function to demonstrate improved functional mobility   Current: 80% better Goal status: MET -previously MET on 09/11/23  5. 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    Current: unable Goal status: MET  -previously MET on 09/11/23  6. Patient will patient will be able to play drums and guitar without pain or neuropathy in left upper extremity   Current: Unable very painful Goal status: Progressing  7. Patient will report at least 75% improvement overall symptoms since symptoms returned   Current: 0% Goal status: MET  8. Patient will be able to perform tasks around farm limitations or severe pain to return to prior level of function   Current: Unable Goal status: Progressing  PLAN:   PT FREQUENCY: 1-2x/week for a total of 16 visits over 12 week certification period   PT DURATION: 12 weeks   PLANNED INTERVENTIONS: Therapeutic exercises, Therapeutic activity, Neuromuscular re-education, Balance training, Gait training, Patient/Family education, Self Care, Joint mobilization, Joint manipulation, Stair training, Canalith repositioning, Orthotic/Fit training, DME instructions, Aquatic Therapy, Dry Needling, Electrical stimulation, Spinal manipulation, Spinal mobilization, Cryotherapy, Moist heat, Traction, Ionotophoresis 4mg /ml Dexamethasone, Manual therapy, and Re-evaluation.   PLAN FOR NEXT SESSION: sleeper stretch on elevated table not in abduction. Strain counterstrain, scapular traction, treat like muscle spasm along left scapular region     2:38 PM, 11/12/23 Tereasa Coop, DPT Physical Therapy with  [facility]

## 2023-11-24 ENCOUNTER — Encounter: Payer: Self-pay | Admitting: Physical Therapy

## 2023-11-24 ENCOUNTER — Ambulatory Visit: Payer: Commercial Managed Care - PPO | Admitting: Physical Therapy

## 2023-11-24 DIAGNOSIS — M5412 Radiculopathy, cervical region: Secondary | ICD-10-CM | POA: Diagnosis not present

## 2023-11-24 DIAGNOSIS — M542 Cervicalgia: Secondary | ICD-10-CM

## 2023-11-24 DIAGNOSIS — M6281 Muscle weakness (generalized): Secondary | ICD-10-CM

## 2023-11-24 DIAGNOSIS — M5459 Other low back pain: Secondary | ICD-10-CM

## 2023-11-24 NOTE — Therapy (Signed)
OUTPATIENT PHYSICAL THERAPY TREATMENT NOTE         Patient Name: Jesse Wong MRN: [id number] DOB:08-06-1981, 43 y.o., male Today's Date: 11/24/2023  PCP: Agapito Games, MD   REFERRING PROVIDER: Monica Becton, MD   END OF SESSION:   PT End of Session - 11/24/23 0931     Visit Number 40    Number of Visits 43    Date for PT Re-Evaluation 12/24/23    Progress Note Due on Visit 47    PT Start Time 0932    PT Stop Time 1010    PT Time Calculation (min) 38 min    Activity Tolerance Patient limited by pain    Behavior During Therapy [facility] for tasks assessed/performed              Past Medical History:  Diagnosis Date   Allergy    Arthritis    Asthma    Chronic prostatitis    Myalgia    Nose injury, initial encounter 06/16/2020   Pulmonary infiltrates    Scrotal pain 01/08/2023   Sinusitis, chronic    Tonsillar abscess    Vasculitis (HCC)    Past Surgical History:  Procedure Laterality Date   BRONCHOSCOPY  2011   TBBX ----inflammation   IR FLUORO GUIDE CV LINE RIGHT  01/31/2017   IR FLUORO GUIDE CV MIDLINE PICC RIGHT  01/30/2017   IR US GUIDE VASC ACCESS RIGHT  01/30/2017   peritonsilar abscess     TYMPANOPLASTY     VIDEO BRONCHOSCOPY Bilateral 06/25/2018   Procedure: VIDEO BRONCHOSCOPY WITHOUT FLUORO;  Surgeon: Coralyn Helling, MD;  Location: [facility];  Service: Cardiopulmonary;  Laterality: Bilateral;   Patient Active Problem List   Diagnosis Date Noted   History of vertebral compression fracture 10/28/2023   Testicular swelling, right 01/08/2023   Radiculitis of left cervical region 12/30/2022   Left lumbar radiculitis 12/30/2022   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   Long term current use of systemic steroids 05/17/2021   Closed fracture of nasal bones 06/19/2020   Coccygeal pain 06/16/2020   Atherosclerosis of aorta (HCC) 04/12/2019   Pulmonary infiltrates    Medication monitoring encounter 03/03/2018   Pulmonary nodule seen  on imaging study 01/15/2018   Microscopic polyangiitis (HCC) 03/14/2016   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   Dyshidrotic eczema 04/08/2014   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   MYALGIA 06/28/2010   GERD 06/27/2010   Nonspecific (abnormal) findings on radiological and other examination of body structure 05/29/2010   CT, CHEST, ABNORMAL 05/29/2010   Asthma with bronchitis 04/09/2010   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     THERAPY DIAG:  Cervicalgia  Muscle weakness (generalized)  Left cervical radiculopathy  Other low back pain     REFERRING DIAG:  Left lumbar radiculitis   Rationale for Evaluation and Treatment: Rehabilitation     ONSET DATE: over the weekend   SUBJECTIVE:  SUBJECTIVE STATEMENT: 11/24/2023 States that he traveled last week Monday through Saturday and had to drive and sit a conferences  and he is doing pretty well. State she was trying to stretch and he was being conscious of his neck and head position. States that he didn't reduce his meds while away. States since he has been trying to wean from his medications since he has been back. States he got his wedge. Occasional lat tightness and quivering triceps pain.   Eval: Patient reports intense neck pain that started 1 and half weeks ago.  Patient reports he was fine on Saturday where he went rollerskating and had no falls or issues but the next day his neck/shoulder/arm started to hurt.  Reports he played the guitar which increased his pain on this left side.  Reports he was given prednisone which did not change his symptoms and was also given gabapentin but has not started that yet.  Patient has a difficult time getting comfortable and pain is just continuous in nature.  He gets slight relief with  immobilization but no relief with anything else he has tried at this time.   Pain radiates into left upper arm and at times into forearm.  Prior to this episode patient was having occasional symptoms but they would resolve after a short while.  During 1 of these episode he did have some hand tingling but has not had it since.   Patient also has a current T7 compression fracture which was found during a CT scan of his chest, reports he does have pain in his lower thoracic (points to around T12) but his neck is his biggest issue at this time. Patient is right-handed but is ambidextrous and some activities.   PERTINENT HISTORY:  Osteoporosis, Wegener's granulomatosis (long term steroid use), psoriatic arthritis, T7 compression fracture   PAIN:  Are you having pain? 2/10 in the neck and back on the left side Aching, sharp, grabbing  PRECAUTIONS: None   WEIGHT BEARING RESTRICTIONS: No   FALLS:  Has patient fallen in last 6 months? No     OCCUPATION: nuclear physicist   PLOF: Independent   PATIENT GOALS: to have less pain and be able to play guitar       OBJECTIVE:    DIAGNOSTIC FINDINGS: 12/30/22 Xray cervical spine  FINDINGS: There is no evidence of cervical spine fracture or prevertebral soft tissue swelling. Alignment is normal. Mild endplate osteophyte formation is present at C5-C6. No significant neural foraminal stenosis bilaterally. The dens is intact and lateral masses are symmetric.   IMPRESSION: Mild degenerative changes at C5-C6.   Xray Lumbar spine FINDINGS: Alignment is anatomic. Vertebral body and disc space heights are normal. No degenerative changes. No definite pars defects.   IMPRESSION: No findings to explain the patient's pain.   CT chest 2 months ago  Musculoskeletal: Degenerative changes in the spine. Slight compression of the T7 vertebral body, unchanged. No worrisome lytic or sclerotic lesions.     COGNITION: Overall cognitive status:  Within functional limits for tasks assessed                                  POSTURE: forward head and flat thoracic spine                   Cervical  AROM:   09/02/23  10/01/23 11/03/23   Flexion  [facility]  WFL 30   Extension  WFL Limited and painful 30*   R ROT  WFL Feels better than left rotation 65   L ROT  WFL Limited and painful 80*   R SB WFL Limited and painful WFL  L SB WFL Limited and painful WFL                (Blank rows = not tested)                  UE Measurements             Upper Extremity Right 09/02/23 Left 09/02/23 Right  11/03/23 Left  11/03/23    A/PROM MMT A/PROM MMT AROM MMT AROM MMT  Shoulder Flexion 170 4+ 170 4+ WFL 4+ WFL 4  Shoulder Extension           Shoulder Abduction  WFL  4+ WFL  5 WFL 4+ WFL 4  Shoulder Adduction              Shoulder Internal Rotation Reaches to T6 SP   5  Reaches to T4 SP 5 Reaches to T6 SP  4 Reaches to T4 SP  4  Shoulder External Rotation Reaches to T4 SP   5 Reaches to T4 SP    5 Reaches to T4 SP  4- Reaches to T4 SP  4-  Elbow Flexion              Elbow Extension              Wrist Flexion              Wrist Extension              Wrist Supination              Wrist Pronation              Wrist Ulnar Deviation              Wrist Radial Deviation              Grip Strength NA   NA                              (Blank rows = not tested)                       * pain in lower back (and in shoulders) - hinges at lower T spine with shoulder flexion    ** increased neuropathy        TODAY'S TREATMENT:                                                                                                                              DATE:   11/24/2023  Therapeutic Exercise: Reviewed HEP Seated and standing - scapular protraction with PT assist - 10 minutes left UE     Neuromuscular  Re-education:  Gait Training:  Self Care: Trigger Point Dry Needling:  Manual therapy: STM to left LAT muscle, triceps, neck muscles 25  minutes  Thermotherapy   PATIENT EDUCATION:  Education details:on HEP, anatomy and rationale for each intervention Person educated: Patient Education method: Explanation, Demonstration, and Handouts Education comprehension: verbalized understanding       HOME EXERCISE PROGRAM: 3/19 box breathing/pain relief strategies [id number]  - previous with long exhale breathing     ASSESSMENT:   CLINICAL IMPRESSION: 11/24/2023 Continued with manual interventions as these were tolerated well.  Continues to have trigger points along anterior left cervical spine but tolerating pressure significantly better with compared to previous sessions.  Answered all questions and patient did not tolerate left upper extremity soft tissue mobilization quite as well but symptoms subsided with stretching afterwards.  No increase in symptoms noted end of session just a muscular workout/fatigue.  Overall patient doing very well and continue to benefit from skilled PT at this time.  Eval: Patient presents to physical therapy today with severe neck and left upper arm pain that started about 1.5 weeks ago.  Assessment limited secondary to high levels of pain on this date.  Greatest relief felt with cervical traction which patient was educated on as well as other pain management strategies.  Discussed current pain response and importance of addressing pain  to down regulate central nervous system.  Patient severely limited in range of motion, function, sleep and overall quality of life secondary to pain and physical limitations at this time and would greatly benefit from skilled physical therapy.   OBJECTIVE IMPAIRMENTS: decreased activity tolerance, decreased ROM, decreased strength, improper body mechanics, postural dysfunction, and pain.    ACTIVITY LIMITATIONS: lifting, bending, sitting, sleeping, dressing, reach over head, and locomotion level   PARTICIPATION LIMITATIONS: community activity and occupation, playing  guitar   PERSONAL FACTORS: 1-2 comorbidities: osteoporosis, vasculitis,  are also affecting patient's functional outcome.    REHAB POTENTIAL: Good   CLINICAL DECISION MAKING: Evolving/moderate complexity   EVALUATION COMPLEXITY: Moderate     GOALS: Goals reviewed with patient? yes   SHORT TERM GOALS: Target date: 11/26/23 Patient will be independent in self management strategies to improve quality of life and functional outcomes. Baseline: New Program Goal status: MET -previously MET on 09/11/23   2.  Patient will report at least 50% improvement in overall symptoms and/or function to demonstrate improved functional mobility Baseline: 0% better Goal status: MET -previously MET on 09/11/23   3.  Patient will be able to find comfortable position at night to improve ability to get comfortable and fall asleep Baseline: Difficult finding comfortable position Goal status:MET-previously MET on 09/11/23   4.  Patient will demonstrate pain-free cervical range of motion in all directions Baseline: Painful Goal status: MET -previously MET on 09/11/23   5. Patient will demonstrate pain-free cervical range of motion   Current: Painful and unable Goal status: Progressing  6. Patient will patient will be able to sit at work without severe pain to be able to do job responsibilities   Current: Unable very painful Goal status: Progressing- last couple days while on medication   7. Patient will be able to find relief throughout the day different positions or postures   Current: Unable Goal status: Progressing - met while on medication     LONG TERM GOALS: Target date:12/24/23   Patient will report at least 75% improvement in overall symptoms and/or function to demonstrate improved functional mobility Baseline: 0% better Goal status:  MET -previously MET on 09/11/23   2.  Patient will be able to play guitar without pain or difficulties to return to prior level of function Baseline:  Painful/difficult Goal status:  MET -previously MET on 09/11/23   3.  Patient report no radicular symptoms in left upper extremity to improve quality of life. Baseline: Current radicular symptoms into left upper extremity Goal status: MET -previously MET on 09/11/23      4. Patient will report at least 90% improvement in overall symptoms and/or function to demonstrate improved functional mobility   Current: 80% better Goal status: MET -previously MET on 09/11/23  5. 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    Current: unable Goal status: MET  -previously MET on 09/11/23  6. Patient will patient will be able to play drums and guitar without pain or neuropathy in left upper extremity   Current: Unable very painful Goal status: Progressing  7. Patient will report at least 75% improvement overall symptoms since symptoms returned   Current: 0% Goal status: MET  8. Patient will be able to perform tasks around farm limitations or severe pain to return to prior level of function   Current: Unable Goal status: Progressing  PLAN:   PT FREQUENCY: 1-2x/week for a total of 16 visits over 12 week certification period   PT DURATION: 12 weeks   PLANNED INTERVENTIONS: Therapeutic exercises, Therapeutic activity, Neuromuscular re-education, Balance training, Gait training, Patient/Family education, Self Care, Joint mobilization, Joint manipulation, Stair training, Canalith repositioning, Orthotic/Fit training, DME instructions, Aquatic Therapy, Dry Needling, Electrical stimulation, Spinal manipulation, Spinal mobilization, Cryotherapy, Moist heat, Traction, Ionotophoresis 4mg /ml Dexamethasone, Manual therapy, and Re-evaluation.   PLAN FOR NEXT SESSION: sleeper stretch on elevated table not in abduction. Strain counterstrain, scapular traction, treat like muscle spasm along left scapular region     12:14  PM, 11/24/23 Tereasa Coop, DPT Physical Therapy with [facility]

## 2023-12-01 ENCOUNTER — Ambulatory Visit: Payer: Commercial Managed Care - PPO | Admitting: Physical Therapy

## 2023-12-01 ENCOUNTER — Encounter: Payer: Self-pay | Admitting: Physical Therapy

## 2023-12-01 DIAGNOSIS — M542 Cervicalgia: Secondary | ICD-10-CM

## 2023-12-01 DIAGNOSIS — M5459 Other low back pain: Secondary | ICD-10-CM

## 2023-12-01 DIAGNOSIS — M5412 Radiculopathy, cervical region: Secondary | ICD-10-CM

## 2023-12-01 DIAGNOSIS — M6281 Muscle weakness (generalized): Secondary | ICD-10-CM

## 2023-12-01 NOTE — Therapy (Signed)
 OUTPATIENT PHYSICAL THERAPY TREATMENT NOTE         Patient Name: Jesse Wong MRN: [id number] DOB:18-Aug-1981, 43 y.o., male Today's Date: 12/01/2023  PCP: Cydney Draft, MD   REFERRING PROVIDER: Gean Keels, MD   END OF SESSION:   PT End of Session - 12/01/23 0928     Visit Number 41    Number of Visits 43    Date for PT Re-Evaluation 12/24/23    Progress Note Due on Visit 47    PT Start Time 0930    PT Stop Time 1010    PT Time Calculation (min) 40 min    Activity Tolerance Patient limited by pain    Behavior During Therapy [facility] for tasks assessed/performed              Past Medical History:  Diagnosis Date   Allergy    Arthritis    Asthma    Chronic prostatitis    Myalgia    Nose injury, initial encounter 06/16/2020   Pulmonary infiltrates    Scrotal pain 01/08/2023   Sinusitis, chronic    Tonsillar abscess    Vasculitis (HCC)    Past Surgical History:  Procedure Laterality Date   BRONCHOSCOPY  2011   TBBX ----inflammation   IR FLUORO GUIDE CV LINE RIGHT  01/31/2017   IR FLUORO GUIDE CV MIDLINE PICC RIGHT  01/30/2017   IR US  GUIDE VASC ACCESS RIGHT  01/30/2017   peritonsilar abscess     TYMPANOPLASTY     VIDEO BRONCHOSCOPY Bilateral 06/25/2018   Procedure: VIDEO BRONCHOSCOPY WITHOUT FLUORO;  Surgeon: Wilder Handy, MD;  Location: [facility];  Service: Cardiopulmonary;  Laterality: Bilateral;   Patient Active Problem List   Diagnosis Date Noted   History of vertebral compression fracture 10/28/2023   Testicular swelling, right 01/08/2023   Radiculitis of left cervical region 12/30/2022   Left lumbar radiculitis 12/30/2022   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   Long term current use of systemic steroids 05/17/2021   Closed fracture of nasal bones 06/19/2020   Coccygeal pain 06/16/2020   Atherosclerosis of aorta (HCC) 04/12/2019   Pulmonary infiltrates    Medication monitoring encounter 03/03/2018   Pulmonary nodule  seen on imaging study 01/15/2018   Microscopic polyangiitis (HCC) 03/14/2016   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   Dyshidrotic eczema 04/08/2014   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   MYALGIA 06/28/2010   GERD 06/27/2010   Nonspecific (abnormal) findings on radiological and other examination of body structure 05/29/2010   CT, CHEST, ABNORMAL 05/29/2010   Asthma with bronchitis 04/09/2010   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     THERAPY DIAG:  Cervicalgia  Muscle weakness (generalized)  Left cervical radiculopathy  Other low back pain     REFERRING DIAG:  Left lumbar radiculitis   Rationale for Evaluation and Treatment: Rehabilitation     ONSET DATE: over the weekend   SUBJECTIVE:  SUBJECTIVE STATEMENT: 12/01/2023 Reports everything is going well and he is down to his last one a day for pre-gaba and that has gone well. States that he has had some tightness in his lats. States he continues to work on them. States he moved incorrectly into left shoulder ER and had a zing of pain that is still bothering him but doing better. States he also feels some some stuff along his left neck. Reports no back symptoms and not radicular symptoms.   Eval: Patient reports intense neck pain that started 1 and half weeks ago.  Patient reports he was fine on Saturday where he went rollerskating and had no falls or issues but the next day his neck/shoulder/arm started to hurt.  Reports he played the guitar which increased his pain on this left side.  Reports he was given prednisone  which did not change his symptoms and was also given gabapentin  but has not started that yet.  Patient has a difficult time getting comfortable and pain is just continuous in nature.  He gets slight relief with  immobilization but no relief with anything else he has tried at this time.   Pain radiates into left upper arm and at times into forearm.  Prior to this episode patient was having occasional symptoms but they would resolve after a short while.  During 1 of these episode he did have some hand tingling but has not had it since.   Patient also has a current T7 compression fracture which was found during a CT scan of his chest, reports he does have pain in his lower thoracic (points to around T12) but his neck is his biggest issue at this time. Patient is right-handed but is ambidextrous and some activities.   PERTINENT HISTORY:  Osteoporosis, Wegener's granulomatosis (long term steroid use), psoriatic arthritis, T7 compression fracture   PAIN:  Are you having pain? 3/10 in he left side of the lat  Aching, sharp, grabbing  PRECAUTIONS: None   WEIGHT BEARING RESTRICTIONS: No   FALLS:  Has patient fallen in last 6 months? No     OCCUPATION: nuclear physicist   PLOF: Independent   PATIENT GOALS: to have less pain and be able to play guitar       OBJECTIVE:    DIAGNOSTIC FINDINGS: 12/30/22 Xray cervical spine  FINDINGS: There is no evidence of cervical spine fracture or prevertebral soft tissue swelling. Alignment is normal. Mild endplate osteophyte formation is present at C5-C6. No significant neural foraminal stenosis bilaterally. The dens is intact and lateral masses are symmetric.   IMPRESSION: Mild degenerative changes at C5-C6.   Xray Lumbar spine FINDINGS: Alignment is anatomic. Vertebral body and disc space heights are normal. No degenerative changes. No definite pars defects.   IMPRESSION: No findings to explain the patient's pain.   CT chest 2 months ago  Musculoskeletal: Degenerative changes in the spine. Slight compression of the T7 vertebral body, unchanged. No worrisome lytic or sclerotic lesions.     COGNITION: Overall cognitive status: Within  functional limits for tasks assessed                                  POSTURE: forward head and flat thoracic spine                   Cervical  AROM:   09/02/23  10/01/23 11/03/23   Flexion  WFL  WFL 30  Extension WFL Limited and painful 30*   R ROT  WFL Feels better than left rotation 65   L ROT  WFL Limited and painful 80*   R SB WFL Limited and painful WFL  L SB WFL Limited and painful WFL                (Blank rows = not tested)                  UE Measurements             Upper Extremity Right 09/02/23 Left 09/02/23 Right  11/03/23 Left  11/03/23    A/PROM MMT A/PROM MMT AROM MMT AROM MMT  Shoulder Flexion 170 4+ 170 4+ WFL 4+ WFL 4  Shoulder Extension           Shoulder Abduction  WFL  4+ WFL  5 WFL 4+ WFL 4  Shoulder Adduction              Shoulder Internal Rotation Reaches to T6 SP   5  Reaches to T4 SP 5 Reaches to T6 SP  4 Reaches to T4 SP  4  Shoulder External Rotation Reaches to T4 SP   5 Reaches to T4 SP    5 Reaches to T4 SP  4- Reaches to T4 SP  4-  Elbow Flexion              Elbow Extension              Wrist Flexion              Wrist Extension              Wrist Supination              Wrist Pronation              Wrist Ulnar Deviation              Wrist Radial Deviation              Grip Strength NA   NA                              (Blank rows = not tested)                       * pain in lower back (and in shoulders) - hinges at lower T spine with shoulder flexion    ** increased neuropathy        TODAY'S TREATMENT:                                                                                                                              DATE:   12/01/2023  Therapeutic Exercise: Reviewed HEP and anatomy Seated: self mobilization to lower ribs and abdominals 6 minutes Quad: modified gaia - 8 minutes  with breathing      Neuromuscular Re-education: isolated neck motion -focus on controlled motion for improved posture- tactile cues of wall  and strap with SNAGGs for extension and rot of c-spine --> added vacuum with motion for postural decompression and posture training Gait Training:  Self Care: Trigger Point Dry Needling:  Manual therapy: STM to left LAT muscle - tolerated well 8 mintes  Thermotherapy   PATIENT EDUCATION:  Education details:on HEP, anatomy and rationale for each intervention Person educated: Patient Education method: Explanation, Demonstration, and Handouts Education comprehension: verbalized understanding       HOME EXERCISE PROGRAM: 3/19 box breathing/pain relief strategies [id number]  - previous with long exhale breathing     ASSESSMENT:   CLINICAL IMPRESSION: 12/01/2023 Session focused on continued education and review of exercise and anatomy. Added movement with vacuum and focus on posture -re education. Tolerated well. Slight increase in lat symptoms but this reduced with manual work tot he left lat. Overall patient doing well with no increase in symptoms end of session. Will continue with current POC as tolerated.   Eval: Patient presents to physical therapy today with severe neck and left upper arm pain that started about 1.5 weeks ago.  Assessment limited secondary to high levels of pain on this date.  Greatest relief felt with cervical traction which patient was educated on as well as other pain management strategies.  Discussed current pain response and importance of addressing pain  to down regulate central nervous system.  Patient severely limited in range of motion, function, sleep and overall quality of life secondary to pain and physical limitations at this time and would greatly benefit from skilled physical therapy.   OBJECTIVE IMPAIRMENTS: decreased activity tolerance, decreased ROM, decreased strength, improper body mechanics, postural dysfunction, and pain.    ACTIVITY LIMITATIONS: lifting, bending, sitting, sleeping, dressing, reach over head, and locomotion level   PARTICIPATION  LIMITATIONS: community activity and occupation, playing guitar   PERSONAL FACTORS: 1-2 comorbidities: osteoporosis, vasculitis,  are also affecting patient's functional outcome.    REHAB POTENTIAL: Good   CLINICAL DECISION MAKING: Evolving/moderate complexity   EVALUATION COMPLEXITY: Moderate     GOALS: Goals reviewed with patient? yes   SHORT TERM GOALS: Target date: 11/26/23 Patient will be independent in self management strategies to improve quality of life and functional outcomes. Baseline: New Program Goal status: MET -previously MET on 09/11/23   2.  Patient will report at least 50% improvement in overall symptoms and/or function to demonstrate improved functional mobility Baseline: 0% better Goal status: MET -previously MET on 09/11/23   3.  Patient will be able to find comfortable position at night to improve ability to get comfortable and fall asleep Baseline: Difficult finding comfortable position Goal status:MET-previously MET on 09/11/23   4.  Patient will demonstrate pain-free cervical range of motion in all directions Baseline: Painful Goal status: MET -previously MET on 09/11/23   5. Patient will demonstrate pain-free cervical range of motion   Current: Painful and unable Goal status: Progressing  6. Patient will patient will be able to sit at work without severe pain to be able to do job responsibilities   Current: Unable very painful Goal status: Progressing- last couple days while on medication   7. Patient will be able to find relief throughout the day different positions or postures   Current: Unable Goal status: Progressing - met while on medication     LONG TERM GOALS: Target date:12/24/23   Patient will report at least  75% improvement in overall symptoms and/or function to demonstrate improved functional mobility Baseline: 0% better Goal status: MET -previously MET on 09/11/23   2.  Patient will be able to play guitar without pain or difficulties  to return to prior level of function Baseline: Painful/difficult Goal status:  MET -previously MET on 09/11/23   3.  Patient report no radicular symptoms in left upper extremity to improve quality of life. Baseline: Current radicular symptoms into left upper extremity Goal status: MET -previously MET on 09/11/23      4. Patient will report at least 90% improvement in overall symptoms and/or function to demonstrate improved functional mobility   Current: 80% better Goal status: MET -previously MET on 09/11/23  5. 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    Current: unable Goal status: MET  -previously MET on 09/11/23  6. Patient will patient will be able to play drums and guitar without pain or neuropathy in left upper extremity   Current: Unable very painful Goal status: Progressing  7. Patient will report at least 75% improvement overall symptoms since symptoms returned   Current: 0% Goal status: MET  8. Patient will be able to perform tasks around farm limitations or severe pain to return to prior level of function   Current: Unable Goal status: Progressing  PLAN:   PT FREQUENCY: 1-2x/week for a total of 16 visits over 12 week certification period   PT DURATION: 12 weeks   PLANNED INTERVENTIONS: Therapeutic exercises, Therapeutic activity, Neuromuscular re-education, Balance training, Gait training, Patient/Family education, Self Care, Joint mobilization, Joint manipulation, Stair training, Canalith repositioning, Orthotic/Fit training, DME instructions, Aquatic Therapy, Dry Needling, Electrical stimulation, Spinal manipulation, Spinal mobilization, Cryotherapy, Moist heat, Traction, Ionotophoresis 4mg /ml Dexamethasone, Manual therapy, and Re-evaluation.   PLAN FOR NEXT SESSION: sleeper stretch on elevated table not in abduction. Strain counterstrain, scapular traction, treat like  muscle spasm along left scapular region     10:27 AM, 12/01/23 Tabitha Ewings, DPT Physical Therapy with [place]

## 2023-12-08 ENCOUNTER — Encounter: Payer: Self-pay | Admitting: Physical Therapy

## 2023-12-08 ENCOUNTER — Ambulatory Visit: Payer: Commercial Managed Care - PPO | Admitting: Physical Therapy

## 2023-12-08 DIAGNOSIS — M6281 Muscle weakness (generalized): Secondary | ICD-10-CM

## 2023-12-08 DIAGNOSIS — M5459 Other low back pain: Secondary | ICD-10-CM

## 2023-12-08 DIAGNOSIS — M542 Cervicalgia: Secondary | ICD-10-CM

## 2023-12-08 DIAGNOSIS — M5412 Radiculopathy, cervical region: Secondary | ICD-10-CM | POA: Diagnosis not present

## 2023-12-08 NOTE — Therapy (Signed)
OUTPATIENT PHYSICAL THERAPY TREATMENT NOTE         Patient Name: Jesse Wong MRN: [id number] DOB:12-May-1981, 43 y.o., male Today's Date: 12/08/2023  PCP: Agapito Games, MD   REFERRING PROVIDER: Monica Becton, MD   END OF SESSION:   PT End of Session - 12/08/23 1014     Visit Number 42    Number of Visits 43    Date for PT Re-Evaluation 12/24/23    Progress Note Due on Visit 47    PT Start Time 1015    PT Stop Time 1055    PT Time Calculation (min) 40 min    Activity Tolerance Patient limited by pain    Behavior During Therapy [facility] for tasks assessed/performed              Past Medical History:  Diagnosis Date   Allergy    Arthritis    Asthma    Chronic prostatitis    Myalgia    Nose injury, initial encounter 06/16/2020   Pulmonary infiltrates    Scrotal pain 01/08/2023   Sinusitis, chronic    Tonsillar abscess    Vasculitis (HCC)    Past Surgical History:  Procedure Laterality Date   BRONCHOSCOPY  2011   TBBX ----inflammation   IR FLUORO GUIDE CV LINE RIGHT  01/31/2017   IR FLUORO GUIDE CV MIDLINE PICC RIGHT  01/30/2017   IR US GUIDE VASC ACCESS RIGHT  01/30/2017   peritonsilar abscess     TYMPANOPLASTY     VIDEO BRONCHOSCOPY Bilateral 06/25/2018   Procedure: VIDEO BRONCHOSCOPY WITHOUT FLUORO;  Surgeon: Coralyn Helling, MD;  Location: [facility];  Service: Cardiopulmonary;  Laterality: Bilateral;   Patient Active Problem List   Diagnosis Date Noted   History of vertebral compression fracture 10/28/2023   Testicular swelling, right 01/08/2023   Radiculitis of left cervical region 12/30/2022   Left lumbar radiculitis 12/30/2022   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   Long term current use of systemic steroids 05/17/2021   Closed fracture of nasal bones 06/19/2020   Coccygeal pain 06/16/2020   Atherosclerosis of aorta (HCC) 04/12/2019   Pulmonary infiltrates    Medication monitoring encounter 03/03/2018   Pulmonary nodule  seen on imaging study 01/15/2018   Microscopic polyangiitis (HCC) 03/14/2016   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   Dyshidrotic eczema 04/08/2014   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 States he had some tightness after moving his headphones. States he has been working the area and it is helping. Fully off his medications since Saturday.    Eval: Patient reports intense neck pain that started 1 and half weeks ago.  Patient reports he was fine on Saturday where he went rollerskating and had no falls or issues but the next day his neck/shoulder/arm started to hurt.  Reports he played the guitar which increased his pain on this left side.  Reports he was given prednisone which did not change his symptoms and was also given gabapentin but has not started that yet.  Patient has a difficult time getting comfortable and pain is just continuous in nature.  He gets slight relief with immobilization but no relief with anything else he has tried at this time.   Pain radiates into left upper arm and at times into forearm.  Prior to this episode patient was having occasional symptoms but they would resolve after a  short while.  During 1 of these episode he did have some hand tingling but has not had it since.   Patient also has a current T7 compression fracture which was found during a CT scan of his chest, reports he does have pain in his lower thoracic (points to around T12) but his neck is his biggest issue at this time. Patient is right-handed but is ambidextrous and some activities.   PERTINENT HISTORY:  Osteoporosis, Wegener's granulomatosis (long term steroid use), psoriatic arthritis, T7 compression fracture   PAIN:  Are you having pain? 1/10 in he left side of the lat  Aching, sharp, grabbing  PRECAUTIONS: None   WEIGHT BEARING RESTRICTIONS: No   FALLS:  Has patient fallen in last 6 months? No     OCCUPATION: nuclear physicist   PLOF: Independent   PATIENT GOALS: to have less pain and be able to play guitar       OBJECTIVE:    DIAGNOSTIC FINDINGS: 12/30/22 Xray cervical spine  FINDINGS: There is no evidence of cervical spine fracture or prevertebral soft tissue swelling. Alignment is normal. Mild endplate osteophyte formation is present at C5-C6. No significant neural foraminal stenosis bilaterally. The dens is intact and lateral masses are symmetric.   IMPRESSION: Mild degenerative changes at C5-C6.   Xray Lumbar spine FINDINGS: Alignment is anatomic. Vertebral body and disc space heights are normal. No degenerative changes. No definite pars defects.   IMPRESSION: No findings to explain the patient's pain.   CT chest 2 months ago  Musculoskeletal: Degenerative changes in the spine. Slight compression of the T7 vertebral body, unchanged. No worrisome lytic or sclerotic lesions.     COGNITION: Overall cognitive status: Within functional limits for tasks assessed                                  POSTURE: forward head and flat thoracic spine                   Cervical  AROM:   09/02/23  10/01/23 11/03/23   Flexion  WFL  WFL 30   Extension WFL Limited  and painful 30*   R ROT  WFL Feels better than left rotation 65   L ROT  WFL Limited and painful 80*   R SB WFL Limited and painful WFL  L SB WFL Limited and painful WFL                (  Blank rows = not tested)                  UE Measurements             Upper Extremity Right 09/02/23 Left 09/02/23 Right  11/03/23 Left  11/03/23    A/PROM MMT A/PROM MMT AROM MMT AROM MMT  Shoulder Flexion 170 4+ 170 4+ WFL 4+ WFL 4  Shoulder Extension           Shoulder Abduction  WFL  4+ WFL  5 WFL 4+ WFL 4  Shoulder Adduction              Shoulder Internal Rotation Reaches to T6 SP   5  Reaches to T4 SP 5 Reaches to T6 SP  4 Reaches to T4 SP  4  Shoulder External Rotation Reaches to T4 SP   5 Reaches to T4 SP    5 Reaches to T4 SP  4- Reaches to T4 SP  4-  Elbow Flexion              Elbow Extension              Wrist Flexion              Wrist Extension              Wrist Supination              Wrist Pronation              Wrist Ulnar Deviation              Wrist Radial Deviation              Grip Strength NA   NA                              (Blank rows = not tested)                       * pain in lower back (and in shoulders) - hinges at lower T spine with shoulder flexion    ** increased neuropathy        TODAY'S TREATMENT:                                                                                                                              DATE:   12/08/2023  Therapeutic Exercise: Reviewed HEP and anatomy  Prone scap squeezes x15 5" holds, shoulder extension x15 5" holds, shoulder flexion stretch 5 minutes L, PROM left shoulder - tolerated well 5 minutes, belly breathing with pelvic activation     Neuromuscular Re-education:  Gait Training:  Self Care: Trigger Point Dry Needling:  Manual therapy: STM to left LAT, rhomboids and paraspinals in thoracic spine - tolerated well, PA to thoracic spine grade II/III 15 minutes  Thermotherapy   PATIENT  EDUCATION:  Education  details:on HEP, anatomy and rationale for each intervention Person educated: Patient Education method: Explanation, Demonstration, and Handouts Education comprehension: verbalized understanding       HOME EXERCISE PROGRAM: 3/19 box breathing/pain relief strategies [id number]  - previous with long exhale breathing     ASSESSMENT:   CLINICAL IMPRESSION: 12/08/2023 Session focused on manual interventions which were tolerated well. Added new exercises to HEP. Slight increase in soreness noted in lat by end of session. Reminded patient of lymph massage and gentle mobility exercises. Answered all questions. Overall patient is doing well and would continue to benefit from skilled PT.   Eval: Patient presents to physical therapy today with severe neck and left upper arm pain that started about 1.5 weeks ago.  Assessment limited secondary to high levels of pain on this date.  Greatest relief felt with cervical traction which patient was educated on as well as other pain management strategies.  Discussed current pain response and importance of addressing pain  to down regulate central nervous system.  Patient severely limited in range of motion, function, sleep and overall quality of life secondary to pain and physical limitations at this time and would greatly benefit from skilled physical therapy.   OBJECTIVE IMPAIRMENTS: decreased activity tolerance, decreased ROM, decreased strength, improper body mechanics, postural dysfunction, and pain.    ACTIVITY LIMITATIONS: lifting, bending, sitting, sleeping, dressing, reach over head, and locomotion level   PARTICIPATION LIMITATIONS: community activity and occupation, playing guitar   PERSONAL FACTORS: 1-2 comorbidities: osteoporosis, vasculitis,  are also affecting patient's functional outcome.    REHAB POTENTIAL: Good   CLINICAL DECISION MAKING: Evolving/moderate complexity   EVALUATION COMPLEXITY: Moderate     GOALS: Goals reviewed with  patient? yes   SHORT TERM GOALS: Target date: 11/26/23 Patient will be independent in self management strategies to improve quality of life and functional outcomes. Baseline: New Program Goal status: MET -previously MET on 09/11/23   2.  Patient will report at least 50% improvement in overall symptoms and/or function to demonstrate improved functional mobility Baseline: 0% better Goal status: MET -previously MET on 09/11/23   3.  Patient will be able to find comfortable position at night to improve ability to get comfortable and fall asleep Baseline: Difficult finding comfortable position Goal status:MET-previously MET on 09/11/23   4.  Patient will demonstrate pain-free cervical range of motion in all directions Baseline: Painful Goal status: MET -previously MET on 09/11/23   5. Patient will demonstrate pain-free cervical range of motion   Current: Painful and unable Goal status: Progressing  6. Patient will patient will be able to sit at work without severe pain to be able to do job responsibilities   Current: Unable very painful Goal status: Progressing- last couple days while on medication   7. Patient will be able to find relief throughout the day different positions or postures   Current: Unable Goal status: Progressing - met while on medication     LONG TERM GOALS: Target date:12/24/23   Patient will report at least 75% improvement in overall symptoms and/or function to demonstrate improved functional mobility Baseline: 0% better Goal status: MET -previously MET on 09/11/23   2.  Patient will be able to play guitar without pain or difficulties to return to prior level of function Baseline: Painful/difficult Goal status:  MET -previously MET on 09/11/23   3.  Patient report no radicular symptoms in left upper extremity to improve quality of life. Baseline: Current radicular symptoms into left  upper extremity Goal status: MET -previously MET on 09/11/23      4. Patient  will report at least 90% improvement in overall symptoms and/or function to demonstrate improved functional mobility   Current: 80% better Goal status: MET -previously MET on 09/11/23  5. 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    Current: unable Goal status: MET  -previously MET on 09/11/23  6. Patient will patient will be able to play drums and guitar without pain or neuropathy in left upper extremity   Current: Unable very painful Goal status: Progressing  7. Patient will report at least 75% improvement overall symptoms since symptoms returned   Current: 0% Goal status: MET  8. Patient will be able to perform tasks around farm limitations or severe pain to return to prior level of function   Current: Unable Goal status: Progressing  PLAN:   PT FREQUENCY: 1-2x/week for a total of 16 visits over 12 week certification period   PT DURATION: 12 weeks   PLANNED INTERVENTIONS: Therapeutic exercises, Therapeutic activity, Neuromuscular re-education, Balance training, Gait training, Patient/Family education, Self Care, Joint mobilization, Joint manipulation, Stair training, Canalith repositioning, Orthotic/Fit training, DME instructions, Aquatic Therapy, Dry Needling, Electrical stimulation, Spinal manipulation, Spinal mobilization, Cryotherapy, Moist heat, Traction, Ionotophoresis 4mg /ml Dexamethasone, Manual therapy, and Re-evaluation.   PLAN FOR NEXT SESSION: sleeper stretch on elevated table not in abduction. Strain counterstrain, scapular traction, treat like muscle spasm along left scapular region     11:08 AM, 12/08/23 Tereasa Coop, DPT Physical Therapy with [facility]

## 2023-12-13 NOTE — Progress Notes (Unsigned)
 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 Presented 2011 with nodular infiltrates and progressive weakness/neuropathy. Bronchoscoped 2011- Neg.  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 FVC 4.91/73%, FEV1 4.19/83%, FEV1/FVC 0.85, FEF 25-75% 0.85, TLC 78%, DLCO 113% PFT 06/08/2014-normal lung volumes, minimal slowing and small airway flows with response to bronchodilator, normal diffusion CXR 08/30/2014-Emphysematous and bronchitic changes consistent with COPD. Bronchoscopy 2019-Dr. Sood-BAL-normal flora.  Aspergillus Ag BAL 0.10 WNL, fungal cultures negative, AFB neg. cytology benign. PFT [facility], 03/10/2019 without interpretation- numerical results scanned to media. 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 Aortic Atherosclerosis,  Presented 2011 with nodular infiltrates and progressive weakness/neuropathy. Induced sputum> M. Chelonae. Parallel f/u with Dr Patel/ Pulmonary @[facility] - agrees with conservative plan for annual CT, holding bronch searching for NTM pending progression. Advised staying off macrolides in case needed for NTM in  future. -Symbicort, Albuterol hfa, neb albuterol, ipratropium 0.06% nasal, rituxan,  Covid vax- 4 Phizer Flu vax-had  [facility] 10/29/22- Dr [facility] for updated CT, daily albuterol nebs, exercise [facility] 10/29/22- continues maintenance rituximab ACT score 25 Doing well on rituxan "changes everything" Anoro changed to Symbicort, used only when needed to minimize steroid. Getting PT for osteoporosis. Immunosuppressed. Gets vaccines from PCP and masks for protection. We agreed to update PFT next year, HRCT in 2 years.  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 Aortic Atherosclerosis,  Presented 2011 with nodular infiltrates and progressive weakness/neuropathy. Induced sputum> M. Chelonae. Parallel f/u with Dr Patel/ Pulmonary @[employer] - agrees with conservative plan for annual CT, holding bronch searching for NTM pending progression. Advised staying off macrolides in case needed for NTM in future. -Symbicort, Albuterol hfa, neb albuterol, ipratropium 0.06% nasal, rituxan, .hpi  Discussed the use of AI scribe software for clinical note transcription with the patient, who gave verbal consent to proceed.  History of Present Illness   The patient, with a history of asthma and a granulomatous angiitis managed with Rituxan infusions, reports stable symptoms. He uses a rescue inhaler and Symbicort, a steroid-containing inhaler, as needed. He tries to minimize the use of the steroid inhaler due to past bone issues and concerns about reduced airway infection resistance. He reports that non-steroidal approaches have not  been effective for him.  The patient continues to receive Rituxan infusions every six months, which he reports keeps his disease at bay. Without Rituxan, his airways become inflamed. He has experienced various infusion reactions, but continues with the treatment as it has been the most effective among different immunosuppressants tried. His next infusion is scheduled for April.  The patient has been followed by a team at [facility], whom he sees approximately once a year. He is part of a research trial and receives the latest information on his disease from this team. The patient's last chest  CT was in February of the previous year, and we discussed possibly doing another one this year, but decided to wait a year as long as he feels stable. We has also discussed possibly doing a pulmonary function test this year or next, but as he feels stable, he has decided to wait. We anticipate both studies in 2026.                                                         ROS-see HPI + = positive Constitutional:   No-   weight loss, night sweats, fevers, chills, fatigue, lassitude. HEENT:   No-  headaches, difficulty swallowing, tooth/dental problems, sore throat,       No-  sneezing, itching, +ear ache,+ nasal congestion, post nasal drip,  CV:  No-   chest pain, orthopnea, PND, swelling in lower extremities, anasarca,                                                     dizziness, palpitations Resp: shortness of breath with exertion or at rest.              No-   productive cough,  + non-productive cough,  No- coughing up of blood.              No-   change in color of mucus.  + wheezing.   Skin: No-   rash or lesions. GI:  No-   heartburn, indigestion, abdominal pain, nausea, vomiting,  GU:  MS:  +  joint pain or swelling.   Neuro-     nothing unusual Psych:  No- change in mood or affect. No depression or anxiety.  No memory loss.  OBJ- Physical Exam General- Alert, Oriented, Affect-appropriate,  Distress- none acute,+ tall, thin,  Skin- no rash Lymphadenopathy- none Head- atraumatic            Eyes- Gross vision intact, PERRLA, conjunctivae and secretions clear            Ears- Hearing, canals-normal            Nose- + marked turbinate edema, no-Septal dev, mucus, polyps, erosion, perforation             Throat- Mallampati II , mucosa clear , drainage- none, tonsils- atrophic Neck- flexible , trachea midline, no stridor , thyroid nl, carotid no bruit Chest - symmetrical excursion , unlabored           Heart/CV- RRR , no murmur , no gallop  , no rub, nl s1 s2                           - JVD- none , edema- none, stasis changes- none, varices- none           Lung- + coarse / wet airway sounds limited now to left base, no- wheeze or fine rhonchi, unlabored,  cough-none , dullness-none, rub- none,            Chest wall-  Abd-  Br/ Gen/ Rectal- Not done, not indicated Extrem- cyanosis- none, clubbing, none, atrophy- none, strength- nl Neuro- grossly intact  to observation  Assessment and Plan:    Granulomatous microangiitis Stable with intermittent use of Symbicort. Rituxan infusions every six months have been effective in managing symptoms, despite infusion reactions. Next infusion scheduled for April 2025. -Continue current management plan with Symbicort and Rituxan. -Consider annual follow-up with Kendell Bane for research trial and latest information.  General Health Maintenance Last chest CT was in February 2024, and pulmonary function test (PFT) has not been performed recently. However, patient is feeling stable. -Plan to perform chest CT and PFT in 2026, unless symptoms change. -Six-month follow-up appointment scheduled.

## 2023-12-15 ENCOUNTER — Encounter: Payer: Self-pay | Admitting: Physical Therapy

## 2023-12-15 ENCOUNTER — Ambulatory Visit: Payer: Commercial Managed Care - PPO | Admitting: Physical Therapy

## 2023-12-15 ENCOUNTER — Ambulatory Visit: Payer: Commercial Managed Care - PPO | Admitting: Internal Medicine

## 2023-12-15 ENCOUNTER — Encounter: Payer: Self-pay | Admitting: Internal Medicine

## 2023-12-15 VITALS — BP 110/66 | HR 73 | Temp 97.8°F | Ht 79.0 in | Wt 195.0 lb

## 2023-12-15 DIAGNOSIS — M542 Cervicalgia: Secondary | ICD-10-CM | POA: Diagnosis not present

## 2023-12-15 DIAGNOSIS — M6281 Muscle weakness (generalized): Secondary | ICD-10-CM | POA: Diagnosis not present

## 2023-12-15 DIAGNOSIS — M317 Microscopic polyangiitis: Secondary | ICD-10-CM | POA: Diagnosis not present

## 2023-12-15 DIAGNOSIS — M5459 Other low back pain: Secondary | ICD-10-CM | POA: Diagnosis not present

## 2023-12-15 DIAGNOSIS — M5412 Radiculopathy, cervical region: Secondary | ICD-10-CM | POA: Diagnosis not present

## 2023-12-15 NOTE — Therapy (Signed)
 OUTPATIENT PHYSICAL THERAPY TREATMENT NOTE    PHYSICAL THERAPY DISCHARGE SUMMARY  Visits from Start of Care: 43  Current functional level related to goals / functional outcomes: See below    Remaining deficits: See below   Education / Equipment: See below   Patient agrees to discharge. Patient goals were met. Patient is being discharged due to being pleased with the current functional level.      Patient Name: Jesse Wong MRN: [id number] DOB:19-Aug-1981, 43 y.o., male Today's Date: 12/15/2023  PCP: Agapito Games, MD   REFERRING PROVIDER: Monica Becton, MD   END OF SESSION:   PT End of Session - 12/15/23 1213     Visit Number 43    Number of Visits 43    Date for PT Re-Evaluation 12/24/23    Progress Note Due on Visit 47    PT Start Time 1215    PT Stop Time 1257    PT Time Calculation (min) 42 min    Activity Tolerance Patient limited by pain    Behavior During Therapy [facility] for tasks assessed/performed               Past Medical History:  Diagnosis Date   Allergy    Arthritis    Asthma    Chronic prostatitis    Myalgia    Nose injury, initial encounter 06/16/2020   Pulmonary infiltrates    Scrotal pain 01/08/2023   Sinusitis, chronic    Tonsillar abscess    Vasculitis (HCC)    Past Surgical History:  Procedure Laterality Date   BRONCHOSCOPY  2011   TBBX ----inflammation   IR FLUORO GUIDE CV LINE RIGHT  01/31/2017   IR FLUORO GUIDE CV MIDLINE PICC RIGHT  01/30/2017   IR US GUIDE VASC ACCESS RIGHT  01/30/2017   peritonsilar abscess     TYMPANOPLASTY     VIDEO BRONCHOSCOPY Bilateral 06/25/2018   Procedure: VIDEO BRONCHOSCOPY WITHOUT FLUORO;  Surgeon: Coralyn Helling, MD;  Location: [facility];  Service: Cardiopulmonary;  Laterality: Bilateral;   Patient Active Problem List   Diagnosis Date Noted   History of vertebral compression fracture 10/28/2023   Testicular swelling, right 01/08/2023   Radiculitis of left cervical  region 12/30/2022   Left lumbar radiculitis 12/30/2022   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   Long term current use of systemic steroids 05/17/2021   Closed fracture of nasal bones 06/19/2020   Coccygeal pain 06/16/2020   Atherosclerosis of aorta (HCC) 04/12/2019   Pulmonary infiltrates    Medication monitoring encounter 03/03/2018   Pulmonary nodule seen on imaging study 01/15/2018   Microscopic polyangiitis (HCC) 03/14/2016   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   Dyshidrotic eczema 04/08/2014   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 He had some pain in his armpit/side but was able to use his knowledge to get out of pain. States he continued to stretch and exercise as well as positional relief strategies. Slight current pain in left trunk/armpit. Since latest return to PT he feels 85% better and is no longer on medications.    Eval: Patient reports intense neck pain that started 1 and half weeks ago.  Patient reports he was fine on Saturday where he went rollerskating and had no falls or issues but the next day his neck/shoulder/arm started to hurt.  Reports he played the guitar which increased his  pain on this left side.  Reports he was given prednisone which did not change his symptoms and was also given gabapentin but has not started that yet.  Patient has a difficult time getting comfortable and pain is just continuous in nature.  He gets slight relief with immobilization but no relief with anything else he has tried at this time.   Pain radiates into left upper arm and at times into forearm.  Prior to this episode patient was having occasional symptoms but they would resolve after a short while.  During 1 of these episode he did have some hand tingling but has not had it since.   Patient also has a current T7 compression fracture which was found during a CT scan of his chest, reports he does have pain in his lower thoracic (points to around T12) but his neck is his biggest issue at this time. Patient is right-handed but is ambidextrous and some activities.   PERTINENT HISTORY:  Osteoporosis, Wegener's granulomatosis (long term steroid use), psoriatic arthritis, T7 compression fracture   PAIN:  Are you having pain? 2/10 in he left side of the lat  Aching, sharp, grabbing  PRECAUTIONS: None   WEIGHT BEARING RESTRICTIONS: No   FALLS:  Has patient fallen in last 6 months? No     OCCUPATION: nuclear physicist   PLOF: Independent   PATIENT GOALS: to have less pain and be able to play guitar       OBJECTIVE:    DIAGNOSTIC FINDINGS: 12/30/22 Xray cervical spine  FINDINGS: There is no evidence of cervical spine fracture or prevertebral soft tissue swelling. Alignment is normal. Mild endplate osteophyte formation is present at C5-C6. No significant neural foraminal stenosis bilaterally. The dens is intact and lateral masses are symmetric.   IMPRESSION: Mild degenerative changes at C5-C6.   Xray Lumbar spine FINDINGS: Alignment is anatomic. Vertebral body and disc space heights are normal. No degenerative changes. No definite pars defects.   IMPRESSION: No findings to  explain the patient's pain.   CT chest 2 months ago  Musculoskeletal: Degenerative changes in the spine. Slight compression of the T7 vertebral body, unchanged. No worrisome lytic or sclerotic lesions.     COGNITION: Overall cognitive status: Within functional limits for tasks assessed                                  POSTURE: forward head and flat thoracic spine                   Cervical  AROM:   09/02/23  10/01/23 11/03/23 2/24   Flexion  WFL  WFL 30 WFL   Extension WFL Limited and painful 30* WFL   R ROT  WFL Feels better than left  rotation 65 WFL   L ROT  WFL Limited and painful 80* WFL*   R SB WFL Limited and painful WFL WFL  L SB WFL Limited and painful WFL WFL*                (Blank rows = not tested) *pain                  UE Measurements             Upper Extremity Right 12/15/23 Left 12/15/23 Right  11/03/23 Left  11/03/23    A/PROM MMT A/PROM MMT AROM MMT AROM MMT  Shoulder Flexion 170 4+ 170 4+ WFL 4+ WFL 4  Shoulder Extension           Shoulder Abduction  WFL  4+ WFL  4+* WFL 4+ WFL 4  Shoulder Adduction              Shoulder Internal Rotation Reaches to T6 SP   5  Reaches to T4 SP* 5 Reaches to T6 SP  4 Reaches to T4 SP  4  Shoulder External Rotation Reaches to T4 SP   5 Reaches to T2 SP*    5 Reaches to T4 SP  4- Reaches to T4 SP  4-  Elbow Flexion              Elbow Extension              Wrist Flexion              Wrist Extension              Wrist Supination              Wrist Pronation              Wrist Ulnar Deviation              Wrist Radial Deviation              Grip Strength NA   NA                              (Blank rows = not tested)                       * pain in lower back (and in shoulders) - hinges at lower T spine with shoulder flexion    ** increased neuropathy        TODAY'S TREATMENT:                                                                                                                              DATE:    12/15/2023  Therapeutic Exercise: Reviewed HEP and anatomy Objective measures updated Quad neck rot 3 minutes Supine neck rot 3 minutes, PROM of left shoulder IR/ER 5 minutes  Neuromuscular Re-education:  Gait Training:  Self Care: Trigger Point Dry Needling:  Manual therapy: STM to cervical musculature, left shoulder muscles, medial mobilizations to cervical spine grade II  Thermotherapy   PATIENT EDUCATION:  Education details:on HEP, anatomy and objective measures Person educated: Patient Education method: Explanation, Demonstration, and Handouts Education comprehension: verbalized understanding       HOME EXERCISE PROGRAM: 3/19 box breathing/pain relief strategies [id number]  - previous with long exhale breathing     ASSESSMENT:   CLINICAL IMPRESSION: 12/15/2023 Session focused on updating objective measures. Overall patient doing much better. Continued pain at times but off all medications. Reviewed HEP and answered all questions . Added assisted cervical ROT on the ball. Patient to DC from PT to HEP secondary to progress made and independence in HEP.  Eval: Patient presents to physical therapy today with severe neck and left upper arm pain that started about 1.5 weeks ago.  Assessment limited secondary to high levels of pain on this date.  Greatest relief felt with cervical traction which patient was educated on as well as other pain management strategies.  Discussed current pain response and importance of addressing pain  to down regulate central nervous system.  Patient severely limited in range of motion, function, sleep and overall quality of life secondary to pain and physical limitations at this time and would greatly benefit from skilled physical therapy.   OBJECTIVE IMPAIRMENTS: decreased activity tolerance, decreased ROM, decreased strength, improper body mechanics, postural dysfunction, and pain.    ACTIVITY LIMITATIONS: lifting, bending, sitting, sleeping,  dressing, reach over head, and locomotion level   PARTICIPATION LIMITATIONS: community activity and occupation, playing guitar   PERSONAL FACTORS: 1-2 comorbidities: osteoporosis, vasculitis,  are also affecting patient's functional outcome.    REHAB POTENTIAL: Good   CLINICAL DECISION MAKING: Evolving/moderate complexity   EVALUATION COMPLEXITY: Moderate     GOALS: Goals reviewed with patient? yes   SHORT TERM GOALS: Target date: 11/26/23 Patient will be independent in self management strategies to improve quality of life and functional outcomes. Baseline: New Program Goal status: MET -previously MET on 09/11/23   2.  Patient will report at least 50% improvement in overall symptoms and/or function to demonstrate improved functional mobility Baseline: 0% better Goal status: MET -previously MET on 09/11/23   3.  Patient will be able to find comfortable position at night to improve ability to get comfortable and fall asleep Baseline: Difficult finding comfortable position Goal status:MET-previously MET on 09/11/23   4.  Patient will demonstrate pain-free cervical range of motion in all directions Baseline: Painful Goal status: MET -previously MET on 09/11/23   5. Patient will demonstrate pain-free cervical range of motion   Current: Painful and unable Goal status: Progressing  6. Patient will patient will be able to sit at work without severe pain to be able to do job responsibilities   Current: Unable very painful Goal status:MET  7. Patient will be able to find relief throughout the day different positions or postures   Current: Unable Goal status: MET     LONG TERM GOALS: Target date:12/24/23   Patient will report at least 75% improvement in overall symptoms and/or function to demonstrate improved functional mobility Baseline: 0% better Goal status: MET -previously MET on 09/11/23   2.  Patient will be able to play guitar without pain or difficulties to return to  prior level of function Baseline: Painful/difficult Goal status:  MET -previously MET on 09/11/23   3.  Patient report no radicular symptoms in left upper extremity to  improve quality of life. Baseline: Current radicular symptoms into left upper extremity Goal status: MET -previously MET on 09/11/23      4. Patient will report at least 90% improvement in overall symptoms and/or function to demonstrate improved functional mobility   Current: 80% better Goal status: MET -previously MET on 09/11/23  5. 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    Current: unable Goal status: MET  -previously MET on 09/11/23  6. Patient will patient will be able to play drums and guitar without pain or neuropathy in left upper extremity   Current: Unable very painful Goal status: MET  7. Patient will report at least 75% improvement overall symptoms since symptoms returned   Current: 0% Goal status: MET  8. Patient will be able to perform tasks around farm limitations or severe pain to return to prior level of function   Current: Unable Goal status: Progressing  PLAN:   PT FREQUENCY: 1-2x/week for a total of 16 visits over 12 week certification period   PT DURATION: 12 weeks   PLANNED INTERVENTIONS: Therapeutic exercises, Therapeutic activity, Neuromuscular re-education, Balance training, Gait training, Patient/Family education, Self Care, Joint mobilization, Joint manipulation, Stair training, Canalith repositioning, Orthotic/Fit training, DME instructions, Aquatic Therapy, Dry Needling, Electrical stimulation, Spinal manipulation, Spinal mobilization, Cryotherapy, Moist heat, Traction, Ionotophoresis 4mg /ml Dexamethasone, Manual therapy, and Re-evaluation.   PLAN FOR NEXT SESSION: DC to HEP     1:07 PM, 12/15/23 Tereasa Coop, DPT Physical Therapy with [facility]

## 2023-12-15 NOTE — Patient Instructions (Signed)
 We will plan to update HRCT and PFT next year. We are here to help

## 2023-12-16 ENCOUNTER — Encounter: Payer: Self-pay | Admitting: Internal Medicine

## 2023-12-16 ENCOUNTER — Telehealth: Payer: Self-pay | Admitting: Family Medicine

## 2023-12-16 NOTE — Telephone Encounter (Signed)
 Pls notify pt overnight oximetry in the normal range. No drop in oxygen overnight.

## 2023-12-16 NOTE — Telephone Encounter (Signed)
 Patient advised of results.

## 2023-12-17 ENCOUNTER — Telehealth: Payer: Commercial Managed Care - PPO | Admitting: "Endocrinology

## 2023-12-18 ENCOUNTER — Other Ambulatory Visit: Payer: Self-pay

## 2024-01-30 ENCOUNTER — Ambulatory Visit: Payer: Commercial Managed Care - PPO

## 2024-01-30 VITALS — BP 123/71 | HR 76 | Temp 98.1°F | Resp 16 | Ht 79.0 in | Wt 189.0 lb

## 2024-01-30 DIAGNOSIS — M317 Microscopic polyangiitis: Secondary | ICD-10-CM

## 2024-01-30 MED ORDER — SODIUM CHLORIDE 0.9 % IV SOLN
1000.0000 mg | Freq: Once | INTRAVENOUS | Status: AC
  Administered 2024-01-30: 1000 mg via INTRAVENOUS
  Filled 2024-01-30: qty 100

## 2024-01-30 MED ORDER — DIPHENHYDRAMINE HCL 50 MG/ML IJ SOLN: 25.0000 mg | Freq: Once | INTRAMUSCULAR | Status: DC

## 2024-01-30 MED ORDER — METHYLPREDNISOLONE SODIUM SUCC 125 MG IJ SOLR
125.0000 mg | Freq: Once | INTRAMUSCULAR | Status: AC
  Administered 2024-01-30: 125 mg via INTRAVENOUS
  Filled 2024-01-30: qty 2

## 2024-01-30 NOTE — Progress Notes (Signed)
 Diagnosis: Microscopic polyangiitis (HCC) [M31.7]  Provider:  Chilton Greathouse MD  Procedure: IV Infusion  IV Type: Peripheral, IV Location: L Forearm  Truxima (Rituximab-abbs), Dose: 1000 mg  Infusion Start Time: 0928  Infusion Stop Time: 1303  Post Infusion IV Care: Patient declined observation and Peripheral IV Discontinued  Discharge: Condition: Good, Destination: Home . AVS Declined  Performed by:  Wyvonne Lenz, RN

## 2024-02-24 ENCOUNTER — Encounter: Payer: Self-pay | Admitting: Family Medicine

## 2024-02-24 DIAGNOSIS — M5412 Radiculopathy, cervical region: Secondary | ICD-10-CM

## 2024-02-24 NOTE — Telephone Encounter (Signed)
 Forwarding to Dr. Alease Hunter to review and confirm order.

## 2024-02-27 ENCOUNTER — Encounter: Payer: Self-pay | Admitting: Family Medicine

## 2024-02-27 MED ORDER — DOXYCYCLINE HYCLATE 100 MG PO TABS
100.0000 mg | ORAL_TABLET | Freq: Two times a day (BID) | ORAL | 0 refills | Status: DC
Start: 2024-02-27 — End: 2024-06-08

## 2024-02-27 NOTE — Telephone Encounter (Signed)
 Called and spoke with [facility].  Order in.

## 2024-03-08 NOTE — Discharge Instructions (Signed)

## 2024-03-09 ENCOUNTER — Inpatient Hospital Stay
Admission: RE | Admit: 2024-03-09 | Discharge: 2024-03-09 | Disposition: A | Discharge status: Home / Self Care | Source: Ambulatory Visit | Attending: Family Medicine | Admitting: Family Medicine

## 2024-03-10 ENCOUNTER — Encounter: Payer: Self-pay | Admitting: Family Medicine

## 2024-03-10 ENCOUNTER — Encounter: Payer: Self-pay | Admitting: Pharmacist

## 2024-03-10 ENCOUNTER — Other Ambulatory Visit (HOSPITAL_COMMUNITY): Payer: Self-pay

## 2024-03-10 ENCOUNTER — Other Ambulatory Visit: Payer: Self-pay

## 2024-03-10 DIAGNOSIS — M5412 Radiculopathy, cervical region: Secondary | ICD-10-CM

## 2024-03-10 MED ORDER — PREGABALIN 50 MG PO CAPS
50.0000 mg | ORAL_CAPSULE | Freq: Three times a day (TID) | ORAL | 1 refills | Status: DC
  Filled 2024-03-10: qty 270, 90d supply, fill #0

## 2024-03-11 ENCOUNTER — Encounter: Payer: Self-pay | Admitting: Internal Medicine

## 2024-03-11 ENCOUNTER — Other Ambulatory Visit (HOSPITAL_COMMUNITY): Payer: Self-pay

## 2024-03-11 ENCOUNTER — Encounter: Payer: Self-pay | Admitting: Oncology

## 2024-03-16 ENCOUNTER — Telehealth: Payer: Self-pay | Admitting: Family Medicine

## 2024-03-16 DIAGNOSIS — M5412 Radiculopathy, cervical region: Secondary | ICD-10-CM

## 2024-03-16 NOTE — Telephone Encounter (Signed)
 Pt is still struggling with his neck pain, he has NOT scheduled epidural yet. Pt wonders if it would be beneficial to repeat the cervical MRI before having injection, last done 01/2023.

## 2024-03-17 NOTE — Addendum Note (Signed)
 Addended by: Syliva Even on: 03/17/2024 12:26 PM   Modules accepted: Orders

## 2024-03-17 NOTE — Telephone Encounter (Signed)
 Is not necessary to get a new MRI but it is very reasonable to.  I have ordered an updated MRI to be done at [facility] [place].  You should hear soon about scheduling this.

## 2024-03-18 ENCOUNTER — Other Ambulatory Visit (HOSPITAL_COMMUNITY): Payer: Self-pay

## 2024-03-21 ENCOUNTER — Ambulatory Visit

## 2024-03-21 DIAGNOSIS — G959 Disease of spinal cord, unspecified: Secondary | ICD-10-CM

## 2024-03-21 DIAGNOSIS — M5412 Radiculopathy, cervical region: Secondary | ICD-10-CM

## 2024-03-21 DIAGNOSIS — M5001 Cervical disc disorder with myelopathy,  high cervical region: Secondary | ICD-10-CM | POA: Diagnosis not present

## 2024-03-21 DIAGNOSIS — M47812 Spondylosis without myelopathy or radiculopathy, cervical region: Secondary | ICD-10-CM | POA: Diagnosis not present

## 2024-03-21 DIAGNOSIS — M4802 Spinal stenosis, cervical region: Secondary | ICD-10-CM | POA: Diagnosis not present

## 2024-03-22 NOTE — Therapy (Signed)
 OUTPATIENT PHYSICAL THERAPY CERVICAL EVALUATION   Patient Name: Jesse Wong MRN: [id number] DOB:12/23/1980, 43 y.o., male Today's Date: 03/23/2024  END OF SESSION:  PT End of Session - 03/23/24 0846     Visit Number 1    Number of Visits 16    Date for PT Re-Evaluation 06/15/24    PT Start Time 0847    PT Stop Time 0928    PT Time Calculation (min) 41 min    Activity Tolerance Patient limited by pain    Behavior During Therapy [facility] for tasks assessed/performed             Past Medical History:  Diagnosis Date   Allergy    Arthritis    Asthma    Chronic prostatitis    Myalgia    Nose injury, initial encounter 06/16/2020   Pulmonary infiltrates    Scrotal pain 01/08/2023   Sinusitis, chronic    Tonsillar abscess    Vasculitis (HCC)    Past Surgical History:  Procedure Laterality Date   BRONCHOSCOPY  2011   TBBX ----inflammation   IR FLUORO GUIDE CV LINE RIGHT  01/31/2017   IR FLUORO GUIDE CV MIDLINE PICC RIGHT  01/30/2017   IR US  GUIDE VASC ACCESS RIGHT  01/30/2017   peritonsilar abscess     TYMPANOPLASTY     VIDEO BRONCHOSCOPY Bilateral 06/25/2018   Procedure: VIDEO BRONCHOSCOPY WITHOUT FLUORO;  Surgeon: Wilder Handy, MD;  Location: [facility];  Service: Cardiopulmonary;  Laterality: Bilateral;   Patient Active Problem List   Diagnosis Date Noted   History of vertebral compression fracture 10/28/2023   Testicular swelling, right 01/08/2023   Radiculitis of left cervical region 12/30/2022   Left lumbar radiculitis 12/30/2022   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   Long term current use of systemic steroids 05/17/2021   Closed fracture of nasal bones 06/19/2020   Coccygeal pain 06/16/2020   Atherosclerosis of aorta (HCC) 04/12/2019   Pulmonary infiltrates    Medication monitoring encounter 03/03/2018   Pulmonary nodule seen on imaging study 01/15/2018   Microscopic polyangiitis (HCC) 03/14/2016   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    Dyshidrotic eczema 04/08/2014   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   SUBJECTIVE:  SUBJECTIVE STATEMENT: Patient reports that overall he was doing pretty well after he left physical therapy in February of this year.  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  Reports that he is able utilize the things he learned to help calm his symptoms down, but this last episode of neck pain required additional help.  Reports that he is back on GABA whereas he had weaned completely off of it earlier this year.  Reports his pain was so bad that at times he could not find a comfortable position and was fearful of driving his truck down the driveway due to concerns.  Reports that he had an injection scheduled but then he started feeling better so he canceled it.  Reports that his pain returned so he now has an injection scheduled for later this week.  Reports he had an MRI done the other day but it has not been read.  Reports overall he wants to make sure he is doing what he needs to to continue to work on managing his neck symptoms and is realizing he may need to use injections as another way  to manage his pain.  Reports he is going to [facility] in [place]  for a week and a half next week and 1 to make sure he has ways to address his symptoms so he can enjoy his vacation.  Reports he does not know if sleep contributes to his symptoms but feels that when he tosses and turns that sometimes his pain increases.  He has had to change his sleep posture due to pain.     PERTINENT HISTORY:  Osteoporosis, Wegener's granulomatosis (long term steroid use), psoriatic arthritis, T7 compression fracture   PAIN:  Are you having pain? Yes: NPRS scale: 2/10 Pain location: left neck and UT and into left triceps to elbow Pain description: dull achy Aggravating factors: sleeping, certain movements and postures  Relieving factors: stretches,   PRECAUTIONS: None  RED FLAGS: None     WEIGHT BEARING RESTRICTIONS: No  FALLS:  Has patient fallen in last 6 months? No  LIVING ENVIRONMENT: Lives with: lives with their family Lives in: House/apartment    OCCUPATION: Tax adviser   PLOF: Independent  PATIENT GOALS: To be able to manage his pain   OBJECTIVE:  Note: Objective measures were completed at Evaluation unless otherwise noted.  DIAGNOSTIC FINDINGS:  Recent MRI 03/21/24 - awaiting results  PATIENT SURVEYS:  Patient-specific activity functional scoring scheme (Point to one number):  "0" represents "unable to perform." "10" represents "able to perform at prior level. 0 1 2 3 4 5 6 7 8 9  10 (Date and Score) Activity Initial  Activity Eval     Sitting for an hour   2    Driving truck 2     walking 2    Additional Additional Total score = sum of the activity scores/number of activities Minimum detectable change (90%CI) for average score = 2 points Minimum detectable change (90%CI) for single activity score = 3 points PSFS developed by: [place] Spitz., & Binkley, J. (1995). Assessing disability and change on individual  patients: a report of a  patient specific measure. Physiotherapy [place], [phone], [phone]. Reproduced with the permission of the authors  Score: 2   COGNITION: Overall cognitive status: Within functional limits for tasks assessed  SENSATION: Not tested  POSTURE: Forward head posture with head tilted to right side  at rest, feet crossed and sacral sitting  PALPATION: Tenderness to palpation and hypomobility noted throughout cervical spine right greater than left   CERVICAL ROM:   Active ROM From 12/15/23 A/PROM (deg) eval  Flexion [facility] WFL*  Extension WFL 20  Right lateral flexion [facility] WFL*  Left lateral flexion WFL* 50% limited*  Right rotation [facility]  Left rotation WFL* 25% limited*   (Blank rows = not tested) *pain                     Upper Extremity Right 12/15/23 Left 12/15/23 Right  EVAL 03/22/24 Left  EVAL 03/22/24    A/PROM MMT A/PROM MMT AROM MMT AROM MMT  Shoulder Flexion 170 4+ 170 4+ 170 4+ 170 4  Shoulder Extension                  Shoulder Abduction  WFL  4+ WFL  4+* WFL 4+ WFL 4  Shoulder Adduction                  Shoulder Internal Rotation Reaches to T6 SP   5  Reaches to T4 SP* 5      Shoulder External Rotation Reaches to T4 SP   5 Reaches to T2 SP*    5      Elbow Flexion                  Elbow Extension                  Wrist Flexion                  Wrist Extension                  Wrist Supination                  Wrist Pronation                  Wrist Ulnar Deviation                  Wrist Radial Deviation                  Grip Strength NA   NA                                  (Blank rows = not tested)                       * pain in lower back (and in shoulders) - hinges at lower T spine with shoulder flexion                          ** increased neuropathy      FUNCTIONAL TESTS:  Pain with all transitional movements and laying supine with and without  TREATMENT DATE:  03/23/2024  Therapeutic Exercise:  Interventions patient is performing at home different exercises positions and stretches as well as pain management strategies Supine: Passive range of motion with endrange flexion and PT assist into rotation Prone:  Seated:  Standing: Neuromuscular Re-education: Educated on different postures and positions with pillow in regards to neck pain at night and in the morning Manual Therapy: Medial glides to cervical spine, STM to cervical paraspinals, anterior scalenes and upper traps Therapeutic Activity: Self Care: Educated patient on different strategies for formation including lymph massage and different foods that could increase inflammation Trigger Point Dry Needling:  Modalities:     PATIENT EDUCATION:  Education details: on current presentation, on HEP, on clinical outcomes score and POC Person educated: Patient Education method: Programmer, multimedia, Facilities manager, and Handouts Education comprehension: verbalized understanding   HOME EXERCISE PROGRAM:  previous interventions: box breathing/pain relief strategies, vacuum, long exhale, prone traction with right rotation positional relief [id number]    ASSESSMENT:  CLINICAL IMPRESSION: Patient is known to this clinic secondary to previous treatment of low back and neck pain.  Patient was released from physical therapy earlier this year as he was managing his system with an extensive home program.  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  Pain has been so bad that at times he cannot drive or required daily tasks.  Overall symptoms have come back down but patient is on all of his previous medication.  Patient presents with limitations in posture, range of motion and strength that are greatly contributing to current presentation.  Patient also has ongoing inflammatory conditions likely contributing to current presentation as well as history of  episodic neck and back pain.  Discussed and educated patient and benefits of injections as well as current symptoms.  Patient would greatly benefit from skilled physical therapy to address severe pain and improve overall function and quality of life.  OBJECTIVE IMPAIRMENTS: decreased activity tolerance, decreased ROM, decreased strength, increased edema, impaired UE functional use, improper body mechanics, postural dysfunction, and pain.   ACTIVITY LIMITATIONS: carrying, lifting, bending, sitting, and sleeping  PARTICIPATION LIMITATIONS: cleaning, occupation, and yard work  PERSONAL FACTORS: Time since onset of injury/illness/exacerbation and 1-2 comorbidities: Osteoporosis, Wegener's granulomatosi are also affecting patient's functional outcome.   REHAB POTENTIAL: Good  CLINICAL DECISION MAKING: Evolving/moderate complexity  EVALUATION COMPLEXITY: Moderate   GOALS: Goals reviewed with patient? yes  SHORT TERM GOALS: Target date: 05/04/2024  Patient will be independent in self management strategies to improve quality of life and functional outcomes. Baseline: New Program Goal status: INITIAL  2.  Patient will report at least 50% improvement in overall symptoms and/or function to demonstrate improved functional mobility Baseline: 0% better Goal status: INITIAL  3.  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  Patient will score at least 2 points higher on PSFS average to demonstrate change in overall function. Baseline: see above Goal status: INITIAL  3.  Patient will demonstrate within functional range of motion of cervical neck in all directions Baseline: See above Goal  status: INITIAL      PLAN:  PT FREQUENCY: 1-2x/week for a total of 16 visits over 12 week certification period  PT DURATION: 12 weeks  PLANNED INTERVENTIONS: 97110-Therapeutic exercises, 97530- Therapeutic activity, [id number]- Neuromuscular re-education, [phone]- Self Care, 60454- Manual therapy, [phone]- Gait training, [phone]- Orthotic Fit/training, [phone]- Canalith repositioning, [id number]- Aquatic Therapy, [id number]- Electrical stimulation (unattended), [phone]- Ionotophoresis 4mg /ml Dexamethasone, Patient/Family education, Balance training, Stair training, Taping, Dry Needling, Joint mobilization, Joint manipulation, Spinal manipulation, Spinal mobilization, Cryotherapy, and Moist heat   PLAN FOR NEXT SESSION: Follow-up with sleep position and posture and manual interventions to her neck Trial supine cervical traction device   12:03 PM, 03/23/24 Tabitha Ewings, DPT Physical Therapy with [place]

## 2024-03-23 ENCOUNTER — Encounter: Payer: Self-pay | Admitting: Physical Therapy

## 2024-03-23 ENCOUNTER — Ambulatory Visit: Admitting: Physical Therapy

## 2024-03-23 DIAGNOSIS — M6281 Muscle weakness (generalized): Secondary | ICD-10-CM

## 2024-03-23 DIAGNOSIS — M542 Cervicalgia: Secondary | ICD-10-CM

## 2024-03-23 NOTE — Discharge Instructions (Signed)

## 2024-03-25 ENCOUNTER — Ambulatory Visit
Admission: RE | Admit: 2024-03-25 | Discharge: 2024-03-25 | Disposition: A | Discharge status: Home / Self Care | Source: Ambulatory Visit | Attending: Family Medicine | Admitting: Family Medicine

## 2024-03-25 DIAGNOSIS — M5412 Radiculopathy, cervical region: Secondary | ICD-10-CM | POA: Diagnosis not present

## 2024-03-25 MED ORDER — IOPAMIDOL (ISOVUE-M 300) INJECTION 61%
1.0000 mL | Freq: Once | INTRAMUSCULAR | Status: AC | PRN
Start: 2024-03-25 — End: 2024-03-25
  Administered 2024-03-25: 1 mL via EPIDURAL

## 2024-03-25 MED ORDER — TRIAMCINOLONE ACETONIDE 40 MG/ML IJ SUSP (RADIOLOGY)
60.0000 mg | Freq: Once | INTRAMUSCULAR | Status: AC
  Administered 2024-03-25: 60 mg via EPIDURAL

## 2024-03-31 ENCOUNTER — Ambulatory Visit: Payer: Self-pay | Admitting: Family Medicine

## 2024-03-31 DIAGNOSIS — M5412 Radiculopathy, cervical region: Secondary | ICD-10-CM

## 2024-03-31 NOTE — Progress Notes (Signed)
 Cervical spine MRI shows potential nerve on the left that could cause the arm pain.  I have ordered an epidural steroid injection.  You should hear from [facility] imaging soon about scheduling.  My apologies for the delay in this read of the MRI.  Apparently fell off the rating last because it had been so long.  Loris Ros my office manager figure it out.

## 2024-04-12 NOTE — Therapy (Unsigned)
 OUTPATIENT PHYSICAL THERAPY CERVICAL TREATMENT   Patient Name: Jesse Wong MRN: [id number] DOB:12-01-1980, 43 y.o., male Today's Date: 04/13/2024  END OF SESSION:  PT End of Session - 04/13/24 0800     Visit Number 2    Number of Visits 16    Date for PT Re-Evaluation 06/15/24    PT Start Time 0801    PT Stop Time 0841    PT Time Calculation (min) 40 min    Activity Tolerance Patient limited by pain    Behavior During Therapy [facility] for tasks assessed/performed           Past Medical History:  Diagnosis Date   Allergy    Arthritis    Asthma    Chronic prostatitis    Myalgia    Nose injury, initial encounter 06/16/2020   Pulmonary infiltrates    Scrotal pain 01/08/2023   Sinusitis, chronic    Tonsillar abscess    Vasculitis (HCC)    Past Surgical History:  Procedure Laterality Date   BRONCHOSCOPY  2011   TBBX ----inflammation   IR FLUORO GUIDE CV LINE RIGHT  01/31/2017   IR FLUORO GUIDE CV MIDLINE PICC RIGHT  01/30/2017   IR US  GUIDE VASC ACCESS RIGHT  01/30/2017   peritonsilar abscess     TYMPANOPLASTY     VIDEO BRONCHOSCOPY Bilateral 06/25/2018   Procedure: VIDEO BRONCHOSCOPY WITHOUT FLUORO;  Surgeon: Shellia Oh, MD;  Location: [facility];  Service: Cardiopulmonary;  Laterality: Bilateral;   Patient Active Problem List   Diagnosis Date Noted   History of vertebral compression fracture 10/28/2023   Testicular swelling, right 01/08/2023   Radiculitis of left cervical region 12/30/2022   Left lumbar radiculitis 12/30/2022   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   Long term current use of systemic steroids 05/17/2021   Closed fracture of nasal bones 06/19/2020   Coccygeal pain 06/16/2020   Atherosclerosis of aorta (HCC) 04/12/2019   Pulmonary infiltrates    Medication monitoring encounter 03/03/2018   Pulmonary nodule seen on imaging study 01/15/2018   Microscopic polyangiitis (HCC) 03/14/2016   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    Dyshidrotic eczema 04/08/2014   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   SUBJECTIVE:  SUBJECTIVE STATEMENT: 04/13/2024 States that he got an injection with slight improvement for a little bit. Just coming back from vacation. Didn't have any pain in his neck and arm. Having left sided flank pain and sacral pain. States he felt like he needs to move his pelvis and stretching it.   EVAL:Patient reports that overall he was doing pretty well after he left physical therapy in February of this year.  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  Reports that he is able utilize the things he learned to help calm his symptoms down, but this last episode of neck pain required additional help.  Reports that he is back on GABA whereas he had weaned completely off of it earlier this year.  Reports his pain was so bad that at times he could not find a comfortable position and was fearful of driving his truck down the driveway due to concerns.  Reports that he had an injection scheduled but then he started feeling better so he canceled it.  Reports that his pain  returned so he now has an injection scheduled for later this week.  Reports he had an MRI done the other day but it has not been read.  Reports overall he wants to make sure he is doing what he needs to to continue to work on managing his neck symptoms and is realizing he may need to use injections as another way to manage his pain.  Reports he is going to [facility] in [place]  for a week and a half next week and 1 to make sure he has ways to address his symptoms so he can enjoy his vacation.  Reports he does not know if sleep contributes to his symptoms but feels that when he tosses and turns that sometimes his pain increases.  He has had to change his sleep posture due to pain.     PERTINENT HISTORY:  Osteoporosis, Wegener's granulomatosis (long term steroid use), psoriatic arthritis, T7 compression fracture   PAIN:  Are you having pain? Yes: NPRS scale: 2/10 Pain location: left neck and UT and into left triceps to elbow Pain description: dull achy Aggravating factors: sleeping, certain movements and postures  Relieving factors: stretches,   PRECAUTIONS: None  RED FLAGS: None     WEIGHT BEARING RESTRICTIONS: No  FALLS:  Has patient fallen in last 6 months? No  LIVING ENVIRONMENT: Lives with: lives with their family Lives in: House/apartment    OCCUPATION: Tax adviser   PLOF: Independent  PATIENT GOALS: To be able to manage his pain   OBJECTIVE:  Note: Objective measures were completed at Evaluation unless otherwise noted.  DIAGNOSTIC FINDINGS:  Recent MRI 03/21/24 - awaiting results  PATIENT SURVEYS:  Patient-specific activity functional scoring scheme (Point to one number):  0 represents "unable to perform." 10 represents "able to perform at prior level. 0 1 2 3 4 5 6 7 8 9  10 (Date and Score) Activity Initial  Activity Eval     Sitting for an hour   2    Driving truck 2     walking 2    Additional Additional Total score = sum of the activity  scores/number of activities Minimum detectable change (90%CI) for average score = 2 points Minimum detectable change (90%CI) for single activity score = 3 points PSFS developed by: Rosalee MYRTIS Marvis KYM Charlet CHRISTELLA., & Binkley, J. (1995). Assessing disability and change on  individual  patients: a report of a patient specific measure. Physiotherapy [place], 47, [phone]. Reproduced with the permission of the authors  Score: 2   COGNITION: Overall cognitive status: Within functional limits for tasks assessed  SENSATION: Not tested  POSTURE: Forward head posture with head tilted to right side at rest, feet crossed and sacral sitting  PALPATION: Tenderness to palpation and hypomobility noted throughout cervical spine right greater than left   CERVICAL ROM:   Active ROM From 12/15/23 A/PROM (deg) eval  Flexion [facility] WFL*  Extension WFL 20  Right lateral flexion [facility] WFL*  Left lateral flexion WFL* 50% limited*  Right rotation [facility]  Left rotation WFL* 25% limited*   (Blank rows = not tested) *pain                     Upper Extremity Right 12/15/23 Left 12/15/23 Right  EVAL 03/22/24 Left  EVAL 03/22/24    A/PROM MMT A/PROM MMT AROM MMT AROM MMT  Shoulder Flexion 170 4+ 170 4+ 170 4+ 170 4  Shoulder Extension                  Shoulder Abduction  WFL  4+ WFL  4+* WFL 4+ WFL 4  Shoulder Adduction                  Shoulder Internal Rotation Reaches to T6 SP   5  Reaches to T4 SP* 5      Shoulder External Rotation Reaches to T4 SP   5 Reaches to T2 SP*    5      Elbow Flexion                  Elbow Extension                  Wrist Flexion                  Wrist Extension                  Wrist Supination                  Wrist Pronation                  Wrist Ulnar Deviation                  Wrist Radial Deviation                  Grip Strength NA   NA                                  (Blank rows = not tested)                       * pain in lower back (and in shoulders) -  hinges at lower T spine with shoulder flexion                          ** increased neuropathy      FUNCTIONAL TESTS:  Pain with all transitional movements and laying supine with and without  TREATMENT DATE:  04/13/2024  Therapeutic Exercise:    Supine: thomas stretch x2 60 holds B knee to chest Lunge hold - elevated surface B 8 minutes total for stretch Prone: PROM of B hips - WNL ROM  Seated: Educated in seated posture with lumbar support  Standing: Neuromuscular Re-education:  Manual Therapy: PA/UPA to T spine grade II, STM to thoracic paraspinals. Cups to thoracic paraspinals. PA to sacral base and apex grade II/III Therapeutic Activity: Self Care:  Trigger Point Dry Needling:  Modalities:     PATIENT EDUCATION:  Education details: on  HEP and anatomy Person educated: Patient Education method: Explanation, Demonstration, and Handouts Education comprehension: verbalized understanding   HOME EXERCISE PROGRAM:  previous interventions: box breathing/pain relief strategies, vacuum, long exhale, prone traction with right rotation positional relief [id number]    ASSESSMENT:  CLINICAL IMPRESSION: 04/13/2024 Assessment of mid thoracic spine secondary to pain along this area recently.  Educated patient and support in different postures and positions with use of towel and lumbar spine.  Patient reports comfort in this position.  Sensitivity to manual interventions transitioned mobilization to cupping which was tolerated better.  Focused on asymmetrical pelvic mobility with lunge and Thomas stretch.  Also tolerated well and this was added to home program.  Will continue with current plan of care as tolerated.  EVAL: Patient is known to this clinic secondary to previous treatment of low back and neck pain.  Patient was released from physical therapy earlier  this year as he was managing his system with an extensive home program.  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  Pain has been so bad that at times he cannot drive or required daily tasks.  Overall symptoms have come back down but patient is on all of his previous medication.  Patient presents with limitations in posture, range of motion and strength that are greatly contributing to current presentation.  Patient also has ongoing inflammatory conditions likely contributing to current presentation as well as history of episodic neck and back pain.  Discussed and educated patient and benefits of injections as well as current symptoms.  Patient would greatly benefit from skilled physical therapy to address severe pain and improve overall function and quality of life.  OBJECTIVE IMPAIRMENTS: decreased activity tolerance, decreased ROM, decreased strength, increased edema, impaired UE functional use, improper body mechanics, postural dysfunction, and pain.   ACTIVITY LIMITATIONS: carrying, lifting, bending, sitting, and sleeping  PARTICIPATION LIMITATIONS: cleaning, occupation, and yard work  PERSONAL FACTORS: Time since onset of injury/illness/exacerbation and 1-2 comorbidities: Osteoporosis, Wegener's granulomatosi are also affecting patient's functional outcome.   REHAB POTENTIAL: Good  CLINICAL DECISION MAKING: Evolving/moderate complexity  EVALUATION COMPLEXITY: Moderate   GOALS: Goals reviewed with patient? yes  SHORT TERM GOALS: Target date: 05/04/2024  Patient will be independent in self management strategies to improve quality of life and functional outcomes. Baseline: New Program Goal status: INITIAL  2.  Patient will report at least 50% improvement in overall symptoms and/or function to demonstrate improved functional mobility Baseline: 0% better Goal status: INITIAL  3.  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  Patient will score at least 2 points higher  on PSFS average to demonstrate change in overall function. Baseline: see above Goal status: INITIAL  3.  Patient will demonstrate within functional range of motion of cervical neck in all directions Baseline: See above Goal status: INITIAL      PLAN:  PT FREQUENCY: 1-2x/week for a total of 16 visits over 12 week certification period  PT DURATION: 12 weeks  PLANNED INTERVENTIONS: 97110-Therapeutic exercises, 97530- Therapeutic activity, 97112- Neuromuscular re-education, 97535- Self Care, [id number]- Manual therapy, [phone]- Gait training, [phone]- Orthotic Fit/training, [phone]- Canalith repositioning, [id number]- Aquatic Therapy, [id number]- Electrical stimulation (unattended), [phone]- Ionotophoresis 4mg /ml Dexamethasone, Patient/Family education, Balance training, Stair training, Taping, Dry Needling, Joint mobilization, Joint manipulation, Spinal manipulation, Spinal mobilization, Cryotherapy, and Moist heat   PLAN FOR NEXT SESSION: Follow-up with sleep position and posture and manual interventions to her neck Trial supine cervical traction device   8:44 AM, 04/13/24 Olivia Church, DPT Physical Therapy with [place]

## 2024-04-13 ENCOUNTER — Ambulatory Visit: Admitting: Physical Therapy

## 2024-04-13 ENCOUNTER — Encounter: Payer: Self-pay | Admitting: Physical Therapy

## 2024-04-13 DIAGNOSIS — M6281 Muscle weakness (generalized): Secondary | ICD-10-CM | POA: Diagnosis not present

## 2024-04-13 DIAGNOSIS — M5412 Radiculopathy, cervical region: Secondary | ICD-10-CM | POA: Diagnosis not present

## 2024-04-13 DIAGNOSIS — M542 Cervicalgia: Secondary | ICD-10-CM | POA: Diagnosis not present

## 2024-04-15 ENCOUNTER — Ambulatory Visit (INDEPENDENT_AMBULATORY_CARE_PROVIDER_SITE_OTHER): Admitting: Physical Therapy

## 2024-04-15 ENCOUNTER — Encounter: Payer: Self-pay | Admitting: Physical Therapy

## 2024-04-15 DIAGNOSIS — M542 Cervicalgia: Secondary | ICD-10-CM | POA: Diagnosis not present

## 2024-04-15 DIAGNOSIS — M5459 Other low back pain: Secondary | ICD-10-CM

## 2024-04-15 DIAGNOSIS — M5412 Radiculopathy, cervical region: Secondary | ICD-10-CM

## 2024-04-15 DIAGNOSIS — M6281 Muscle weakness (generalized): Secondary | ICD-10-CM | POA: Diagnosis not present

## 2024-04-15 NOTE — Therapy (Signed)
 OUTPATIENT PHYSICAL THERAPY CERVICAL TREATMENT   Patient Name: Jesse Wong MRN: [id number] DOB:May 27, 1981, 43 y.o., male Today's Date: 04/15/2024  END OF SESSION:  PT End of Session - 04/15/24 1300     Visit Number 3    Number of Visits 16    Date for PT Re-Evaluation 06/15/24    PT Start Time 1302    PT Stop Time 1342    PT Time Calculation (min) 40 min    Activity Tolerance Patient limited by pain    Behavior During Therapy [facility] for tasks assessed/performed           Past Medical History:  Diagnosis Date   Allergy    Arthritis    Asthma    Chronic prostatitis    Myalgia    Nose injury, initial encounter 06/16/2020   Pulmonary infiltrates    Scrotal pain 01/08/2023   Sinusitis, chronic    Tonsillar abscess    Vasculitis (HCC)    Past Surgical History:  Procedure Laterality Date   BRONCHOSCOPY  2011   TBBX ----inflammation   IR FLUORO GUIDE CV LINE RIGHT  01/31/2017   IR FLUORO GUIDE CV MIDLINE PICC RIGHT  01/30/2017   IR US  GUIDE VASC ACCESS RIGHT  01/30/2017   peritonsilar abscess     TYMPANOPLASTY     VIDEO BRONCHOSCOPY Bilateral 06/25/2018   Procedure: VIDEO BRONCHOSCOPY WITHOUT FLUORO;  Surgeon: Shellia Oh, MD;  Location: [facility];  Service: Cardiopulmonary;  Laterality: Bilateral;   Patient Active Problem List   Diagnosis Date Noted   History of vertebral compression fracture 10/28/2023   Testicular swelling, right 01/08/2023   Radiculitis of left cervical region 12/30/2022   Left lumbar radiculitis 12/30/2022   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   Long term current use of systemic steroids 05/17/2021   Closed fracture of nasal bones 06/19/2020   Coccygeal pain 06/16/2020   Atherosclerosis of aorta (HCC) 04/12/2019   Pulmonary infiltrates    Medication monitoring encounter 03/03/2018   Pulmonary nodule seen on imaging study 01/15/2018   Microscopic polyangiitis (HCC) 03/14/2016   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    Dyshidrotic eczema 04/08/2014   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   MYALGIA 06/28/2010   GERD 06/27/2010   Nonspecific (abnormal) findings on radiological and other examination of body structure 05/29/2010   CT, CHEST, ABNORMAL 05/29/2010   Asthma with bronchitis 04/09/2010   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    PCP: Alvan Dorothyann BIRCH, MD  REFERRING PROVIDER: Alvan Dorothyann BIRCH, MD  REFERRING DIAG: M54.12 (ICD-10-CM) - Radiculitis of left cervical region  THERAPY DIAG:  Cervicalgia  Muscle weakness (generalized)  Left cervical radiculopathy  Other low back pain  Rationale for Evaluation and Treatment: Rehabilitation  ONSET DATE: a couple months ago.   SUBJECTIVE:  SUBJECTIVE STATEMENT: 04/15/2024 Neck is feeling better. States his lower back is was painful with slight increase in lumbar support.   EVAL:Patient reports that overall he was doing pretty well after he left physical therapy in February of this year.  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  Reports that he is able utilize the things he learned to help calm his symptoms down, but this last episode of neck pain required additional help.  Reports that he is back on GABA whereas he had weaned completely off of it earlier this year.  Reports his pain was so bad that at times he could not find a comfortable position and was fearful of driving his truck down the driveway due to concerns.  Reports that he had an injection scheduled but then he started feeling better so he canceled it.  Reports that his pain returned so he now has an injection scheduled for later this week.  Reports he had an MRI done the other day but it has not been read.   Reports overall he wants to make sure he is doing what he needs to to continue to work on managing his neck symptoms and is realizing he may need to use injections as another way to manage his pain.  Reports he is going to [facility] in [place]  for a week and a half next week and 1 to make sure he has ways to address his symptoms so he can enjoy his vacation.  Reports he does not know if sleep contributes to his symptoms but feels that when he tosses and turns that sometimes his pain increases.  He has had to change his sleep posture due to pain.     PERTINENT HISTORY:  Osteoporosis, Wegener's granulomatosis (long term steroid use), psoriatic arthritis, T7 compression fracture   PAIN:  Are you having pain? Yes: NPRS scale: 2/10 Pain location: left neck and UT and into left triceps to elbow Pain description: dull achy Aggravating factors: sleeping, certain movements and postures  Relieving factors: stretches,   PRECAUTIONS: None  RED FLAGS: None     WEIGHT BEARING RESTRICTIONS: No  FALLS:  Has patient fallen in last 6 months? No  LIVING ENVIRONMENT: Lives with: lives with their family Lives in: House/apartment    OCCUPATION: Tax adviser   PLOF: Independent  PATIENT GOALS: To be able to manage his pain   OBJECTIVE:  Note: Objective measures were completed at Evaluation unless otherwise noted.  DIAGNOSTIC FINDINGS:  Recent MRI 03/21/24 - awaiting results  PATIENT SURVEYS:  Patient-specific activity functional scoring scheme (Point to one number):  0 represents "unable to perform." 10 represents "able to perform at prior level. 0 1 2 3 4 5 6 7 8 9  10 (Date and Score) Activity Initial  Activity Eval     Sitting for an hour   2    Driving truck 2     walking 2    Additional Additional Total score = sum of the activity scores/number of activities Minimum detectable change (90%CI) for average score = 2 points Minimum detectable change (90%CI) for single  activity score = 3 points PSFS developed by: Rosalee MYRTIS Marvis KYM Charlet CHRISTELLA., & Binkley, J. (1995). Assessing disability and change on individual  patients: a report of a patient specific measure. Physiotherapy [place], 47, [phone]. Reproduced with the permission of the authors  Score: 2   COGNITION: Overall cognitive status: Within  functional limits for tasks assessed  SENSATION: Not tested  POSTURE: Forward head posture with head tilted to right side at rest, feet crossed and sacral sitting  PALPATION: Tenderness to palpation and hypomobility noted throughout cervical spine right greater than left   CERVICAL ROM:   Active ROM From 12/15/23 A/PROM (deg) eval  Flexion [facility] WFL*  Extension WFL 20  Right lateral flexion [facility] WFL*  Left lateral flexion WFL* 50% limited*  Right rotation [facility]  Left rotation WFL* 25% limited*   (Blank rows = not tested) *pain                     Upper Extremity Right 12/15/23 Left 12/15/23 Right  EVAL 03/22/24 Left  EVAL 03/22/24    A/PROM MMT A/PROM MMT AROM MMT AROM MMT  Shoulder Flexion 170 4+ 170 4+ 170 4+ 170 4  Shoulder Extension                  Shoulder Abduction  WFL  4+ WFL  4+* WFL 4+ WFL 4  Shoulder Adduction                  Shoulder Internal Rotation Reaches to T6 SP   5  Reaches to T4 SP* 5      Shoulder External Rotation Reaches to T4 SP   5 Reaches to T2 SP*    5      Elbow Flexion                  Elbow Extension                  Wrist Flexion                  Wrist Extension                  Wrist Supination                  Wrist Pronation                  Wrist Ulnar Deviation                  Wrist Radial Deviation                  Grip Strength NA   NA                                  (Blank rows = not tested)                       * pain in lower back (and in shoulders) - hinges at lower T spine with shoulder flexion                          ** increased neuropathy      FUNCTIONAL TESTS:  Pain with all  transitional movements and laying supine with and without  TREATMENT DATE:  04/15/2024  Therapeutic Exercise:   Reviewed HEP and answered all questions Supine:  Prone: PROM of left shoulder   Seated:   Standing: Neuromuscular Re-education:  Manual Therapy: PA/UPA to cervical spine grade II/III, STM to thoracic/cervical  paraspinals and left lat  Therapeutic Activity: Self Care:  Trigger Point Dry Needling:  Modalities:     PATIENT EDUCATION:  Education details: on  HEP and anatomy Person educated: Patient Education method: Explanation, Demonstration, and Handouts Education comprehension: verbalized understanding   HOME EXERCISE PROGRAM:  previous interventions: box breathing/pain relief strategies, vacuum, long exhale, prone traction with right rotation positional relief [id number]    ASSESSMENT:  CLINICAL IMPRESSION: 04/15/2024 Focused on cervical mobilization which was tolerated well. Reviewed HEP and answered all questions. Overall patient doing well and reported reduced tensionand improved mobility end of session. Will continue with current POC as tolerated.   EVAL: Patient is known to this clinic secondary to previous treatment of low back and neck pain.  Patient was released from physical therapy earlier this year as he was managing his system with an extensive home program.  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  Pain has been so bad that at times he cannot drive or required daily tasks.  Overall symptoms have come back down but patient is on all of his previous medication.  Patient presents with limitations in posture, range of motion and strength that are greatly contributing to current presentation.  Patient also has ongoing inflammatory conditions likely contributing to current presentation as well  as history of episodic neck and back pain.  Discussed and educated patient and benefits of injections as well as current symptoms.  Patient would greatly benefit from skilled physical therapy to address severe pain and improve overall function and quality of life.  OBJECTIVE IMPAIRMENTS: decreased activity tolerance, decreased ROM, decreased strength, increased edema, impaired UE functional use, improper body mechanics, postural dysfunction, and pain.   ACTIVITY LIMITATIONS: carrying, lifting, bending, sitting, and sleeping  PARTICIPATION LIMITATIONS: cleaning, occupation, and yard work  PERSONAL FACTORS: Time since onset of injury/illness/exacerbation and 1-2 comorbidities: Osteoporosis, Wegener's granulomatosi are also affecting patient's functional outcome.   REHAB POTENTIAL: Good  CLINICAL DECISION MAKING: Evolving/moderate complexity  EVALUATION COMPLEXITY: Moderate   GOALS: Goals reviewed with patient? yes  SHORT TERM GOALS: Target date: 05/04/2024  Patient will be independent in self management strategies to improve quality of life and functional outcomes. Baseline: New Program Goal status: INITIAL  2.  Patient will report at least 50% improvement in overall symptoms and/or function to demonstrate improved functional mobility Baseline: 0% better Goal status: INITIAL  3.  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  Patient will score at least 2 points higher on PSFS average to demonstrate change in overall function. Baseline: see above Goal status: INITIAL  3.  Patient will demonstrate within functional range of motion of cervical neck in all directions Baseline:  See above Goal status: INITIAL      PLAN:  PT FREQUENCY: 1-2x/week for a total of 16 visits over 12 week certification period  PT DURATION: 12 weeks  PLANNED INTERVENTIONS: 97110-Therapeutic exercises, 97530- Therapeutic activity, 97112- Neuromuscular re-education,  02464- Self Care, 02859- Manual therapy, [id number]- Gait training, [phone]- Orthotic Fit/training, [phone]- Canalith repositioning, [id number]- Aquatic Therapy, 97014- Electrical stimulation (unattended), [phone]- Ionotophoresis 4mg /ml Dexamethasone, Patient/Family education, Balance training, Stair training, Taping, Dry Needling, Joint mobilization, Joint manipulation, Spinal manipulation, Spinal mobilization, Cryotherapy, and Moist heat   PLAN FOR NEXT SESSION: Follow-up with sleep position and posture and manual interventions to her neck Trial supine cervical traction device   2:06 PM, 04/15/24 Olivia Church, DPT Physical Therapy with [place]

## 2024-04-16 DIAGNOSIS — Z6822 Body mass index (BMI) 22.0-22.9, adult: Secondary | ICD-10-CM | POA: Diagnosis not present

## 2024-04-16 DIAGNOSIS — Z7952 Long term (current) use of systemic steroids: Secondary | ICD-10-CM | POA: Diagnosis not present

## 2024-04-16 DIAGNOSIS — B449 Aspergillosis, unspecified: Secondary | ICD-10-CM | POA: Diagnosis not present

## 2024-04-16 DIAGNOSIS — M317 Microscopic polyangiitis: Secondary | ICD-10-CM | POA: Diagnosis not present

## 2024-04-16 DIAGNOSIS — L4059 Other psoriatic arthropathy: Secondary | ICD-10-CM | POA: Diagnosis not present

## 2024-04-19 ENCOUNTER — Ambulatory Visit (INDEPENDENT_AMBULATORY_CARE_PROVIDER_SITE_OTHER): Admitting: Physical Therapy

## 2024-04-19 ENCOUNTER — Encounter: Payer: Self-pay | Admitting: Physical Therapy

## 2024-04-19 DIAGNOSIS — M5459 Other low back pain: Secondary | ICD-10-CM | POA: Diagnosis not present

## 2024-04-19 DIAGNOSIS — M5412 Radiculopathy, cervical region: Secondary | ICD-10-CM | POA: Diagnosis not present

## 2024-04-19 DIAGNOSIS — M6281 Muscle weakness (generalized): Secondary | ICD-10-CM | POA: Diagnosis not present

## 2024-04-19 DIAGNOSIS — M542 Cervicalgia: Secondary | ICD-10-CM | POA: Diagnosis not present

## 2024-04-19 NOTE — Therapy (Signed)
 OUTPATIENT PHYSICAL THERAPY CERVICAL TREATMENT   Patient Name: Jesse Wong MRN: [id number] DOB:[birth year], 43 y.o., male Today's Date: 04/19/2024  END OF SESSION:  PT End of Session - 04/19/24 1512     Visit Number 4    Number of Visits 16    Date for PT Re-Evaluation 06/15/24    PT Start Time 1515    PT Stop Time 1555    PT Time Calculation (min) 40 min    Activity Tolerance Patient limited by pain    Behavior During Therapy [facility] for tasks assessed/performed           Past Medical History:  Diagnosis Date   Allergy    Arthritis    Asthma    Chronic prostatitis    Myalgia    Nose injury, initial encounter 06/16/2020   Pulmonary infiltrates    Scrotal pain 01/08/2023   Sinusitis, chronic    Tonsillar abscess    Vasculitis (HCC)    Past Surgical History:  Procedure Laterality Date   BRONCHOSCOPY  2011   TBBX ----inflammation   IR FLUORO GUIDE CV LINE RIGHT  01/31/2017   IR FLUORO GUIDE CV MIDLINE PICC RIGHT  01/30/2017   IR US  GUIDE VASC ACCESS RIGHT  01/30/2017   peritonsilar abscess     TYMPANOPLASTY     VIDEO BRONCHOSCOPY Bilateral 06/25/2018   Procedure: VIDEO BRONCHOSCOPY WITHOUT FLUORO;  Surgeon: Shellia Oh, MD;  Location: [facility];  Service: Cardiopulmonary;  Laterality: Bilateral;   Patient Active Problem List   Diagnosis Date Noted   History of vertebral compression fracture 10/28/2023   Testicular swelling, right 01/08/2023   Radiculitis of left cervical region 12/30/2022   Left lumbar radiculitis 12/30/2022   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   Long term current use of systemic steroids 05/17/2021   Closed fracture of nasal bones 06/19/2020   Coccygeal pain 06/16/2020   Atherosclerosis of aorta (HCC) 04/12/2019   Pulmonary infiltrates    Medication monitoring encounter 03/03/2018   Pulmonary nodule seen on imaging study 01/15/2018   Microscopic polyangiitis (HCC) 03/14/2016   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    Dyshidrotic eczema 04/08/2014   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   MYALGIA 06/28/2010   GERD 06/27/2010   Nonspecific (abnormal) findings on radiological and other examination of body structure 05/29/2010   CT, CHEST, ABNORMAL 05/29/2010   Asthma with bronchitis 04/09/2010   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    PCP: Alvan Dorothyann BIRCH, MD  REFERRING PROVIDER: Alvan Dorothyann BIRCH, MD  REFERRING DIAG: M54.12 (ICD-10-CM) - Radiculitis of left cervical region  THERAPY DIAG:  Cervicalgia  Muscle weakness (generalized)  Left cervical radiculopathy  Other low back pain  Rationale for Evaluation and Treatment: Rehabilitation  ONSET DATE: a couple months ago.   SUBJECTIVE:  SUBJECTIVE STATEMENT: 04/19/2024 States he had back pain on Saturday and he could almost not walk. States his neck is doing better. Pain was at L5/S1 and had pain around that site. Still feeling the pain today.    EVAL:Patient reports that overall he was doing pretty well after he left physical therapy in February of this year.  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  Reports that he is able utilize the things he learned to help calm his symptoms down, but this last episode of neck pain required additional help.  Reports that he is back on GABA whereas he had weaned completely off of it earlier this year.  Reports his pain was so bad that at times he could not find a comfortable position and was fearful of driving his truck down the driveway due to concerns.  Reports that he had an injection scheduled but then he started feeling better so he canceled it.  Reports that his pain returned so he now has an injection scheduled for later this  week.  Reports he had an MRI done the other day but it has not been read.  Reports overall he wants to make sure he is doing what he needs to to continue to work on managing his neck symptoms and is realizing he may need to use injections as another way to manage his pain.  Reports he is going to [facility] in [place]  for a week and a half next week and 1 to make sure he has ways to address his symptoms so he can enjoy his vacation.  Reports he does not know if sleep contributes to his symptoms but feels that when he tosses and turns that sometimes his pain increases.  He has had to change his sleep posture due to pain.     PERTINENT HISTORY:  Osteoporosis, Wegener's granulomatosis (long term steroid use), psoriatic arthritis, T7 compression fracture   PAIN:  Are you having pain? Yes: NPRS scale: 3/10 Pain location: low back  Pain description: dull achy Aggravating factors: sleeping, certain movements and postures  Relieving factors: stretches,   PRECAUTIONS: None  RED FLAGS: None     WEIGHT BEARING RESTRICTIONS: No  FALLS:  Has patient fallen in last 6 months? No  LIVING ENVIRONMENT: Lives with: lives with their family Lives in: House/apartment    OCCUPATION: Tax adviser   PLOF: Independent  PATIENT GOALS: To be able to manage his pain   OBJECTIVE:  Note: Objective measures were completed at Evaluation unless otherwise noted.  DIAGNOSTIC FINDINGS:  Recent MRI 03/21/24 - awaiting results  PATIENT SURVEYS:  Patient-specific activity functional scoring scheme (Point to one number):  0 represents "unable to perform." 10 represents "able to perform at prior level. 0 1 2 3 4 5 6 7 8 9  10 (Date and Score) Activity Initial  Activity Eval     Sitting for an hour   2    Driving truck 2     walking 2    Additional Additional Total score = sum of the activity scores/number of activities Minimum detectable change (90%CI) for average score = 2 points Minimum  detectable change (90%CI) for single activity score = 3 points PSFS developed by: Rosalee MYRTIS Marvis KYM Charlet CHRISTELLA., & Binkley, J. (1995). Assessing disability and change on individual  patients: a report of a patient specific measure. Physiotherapy [place], 47, [phone]. Reproduced with the permission of the  authors  Score: 2   COGNITION: Overall cognitive status: Within functional limits for tasks assessed  SENSATION: Not tested  POSTURE: Forward head posture with head tilted to right side at rest, feet crossed and sacral sitting  PALPATION: Tenderness to palpation and hypomobility noted throughout cervical spine right greater than left   CERVICAL ROM:   Active ROM From 12/15/23 A/PROM (deg) eval  Flexion [facility] WFL*  Extension WFL 20  Right lateral flexion [facility] WFL*  Left lateral flexion WFL* 50% limited*  Right rotation [facility]  Left rotation WFL* 25% limited*   (Blank rows = not tested) *pain                     Upper Extremity Right 12/15/23 Left 12/15/23 Right  EVAL 03/22/24 Left  EVAL 03/22/24    A/PROM MMT A/PROM MMT AROM MMT AROM MMT  Shoulder Flexion 170 4+ 170 4+ 170 4+ 170 4  Shoulder Extension                  Shoulder Abduction  WFL  4+ WFL  4+* WFL 4+ WFL 4  Shoulder Adduction                  Shoulder Internal Rotation Reaches to T6 SP   5  Reaches to T4 SP* 5      Shoulder External Rotation Reaches to T4 SP   5 Reaches to T2 SP*    5      Elbow Flexion                  Elbow Extension                  Wrist Flexion                  Wrist Extension                  Wrist Supination                  Wrist Pronation                  Wrist Ulnar Deviation                  Wrist Radial Deviation                  Grip Strength NA   NA                                  (Blank rows = not tested)                       * pain in lower back (and in shoulders) - hinges at lower T spine with shoulder flexion                          ** increased neuropathy       FUNCTIONAL TESTS:  Pain with all transitional movements and laying supine with and without  TREATMENT DATE:  04/19/2024  Therapeutic Exercise:   Reviewed HEP and answered all questions  Educated on anatomy and posture/pressures on lumbar discs/spine Supine:  90/90 recovery position  Prone: PROM of hips B  Seated:   Standing: Neuromuscular Re-education:  Manual Therapy: PA/UPA grade II/II lumbar spine and sacrum, cupping to lumbar musculature and sacrum.  STM to lumbar paraspinals and glutes, lumbar traction - felt good Therapeutic Activity: Self Care:  Trigger Point Dry Needling:  Modalities:     PATIENT EDUCATION:  Education details: on  HEP and anatomy Person educated: Patient Education method: Explanation, Demonstration, and Handouts Education comprehension: verbalized understanding   HOME EXERCISE PROGRAM:  previous interventions: box breathing/pain relief strategies, vacuum, long exhale, prone traction with right rotation positional relief [id number]    ASSESSMENT:  CLINICAL IMPRESSION: 04/19/2024 Session focused on manual interventions to lumbar spine secondary to increase in pain in this area. Tolerated manual interventions and 90/90 recovery position the best. Added this to HEP. Lumbar traction also alleviated symptoms. Reduced symptoms noted end of session. Will continue with current POC as tolerated. Discussed regular body work for symptom management.   EVAL: Patient is known to this clinic secondary to previous treatment of low back and neck pain.  Patient was released from physical therapy earlier this year as he was managing his system with an extensive home program.  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  Pain has been so bad that at times he cannot drive or required daily tasks.   Overall symptoms have come back down but patient is on all of his previous medication.  Patient presents with limitations in posture, range of motion and strength that are greatly contributing to current presentation.  Patient also has ongoing inflammatory conditions likely contributing to current presentation as well as history of episodic neck and back pain.  Discussed and educated patient and benefits of injections as well as current symptoms.  Patient would greatly benefit from skilled physical therapy to address severe pain and improve overall function and quality of life.  OBJECTIVE IMPAIRMENTS: decreased activity tolerance, decreased ROM, decreased strength, increased edema, impaired UE functional use, improper body mechanics, postural dysfunction, and pain.   ACTIVITY LIMITATIONS: carrying, lifting, bending, sitting, and sleeping  PARTICIPATION LIMITATIONS: cleaning, occupation, and yard work  PERSONAL FACTORS: Time since onset of injury/illness/exacerbation and 1-2 comorbidities: Osteoporosis, Wegener's granulomatosi are also affecting patient's functional outcome.   REHAB POTENTIAL: Good  CLINICAL DECISION MAKING: Evolving/moderate complexity  EVALUATION COMPLEXITY: Moderate   GOALS: Goals reviewed with patient? yes  SHORT TERM GOALS: Target date: 05/04/2024  Patient will be independent in self management strategies to improve quality of life and functional outcomes. Baseline: New Program Goal status: INITIAL  2.  Patient will report at least 50% improvement in overall symptoms and/or function to demonstrate improved functional mobility Baseline: 0% better Goal status: INITIAL  3.  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  Patient will score at least 2 points higher on PSFS average to demonstrate change in overall function. Baseline: see above Goal status: INITIAL  3.  Patient will demonstrate within functional range of motion of cervical neck in all directions Baseline:  See above Goal status: INITIAL      PLAN:  PT FREQUENCY: 1-2x/week for a total of 16 visits over 12 week certification period  PT DURATION: 12 weeks  PLANNED INTERVENTIONS: 97110-Therapeutic exercises, 97530- Therapeutic activity, 97112- Neuromuscular re-education, 97535- Self Care, [id number]- Manual therapy, [phone]- Gait training, [phone]- Orthotic Fit/training, [phone]- Canalith repositioning, [id number]- Aquatic Therapy, [id number]- Electrical stimulation (unattended), [phone]- Ionotophoresis 4mg /ml Dexamethasone, Patient/Family education, Balance training, Stair training, Taping, Dry Needling, Joint mobilization, Joint manipulation, Spinal manipulation, Spinal mobilization, Cryotherapy, and Moist heat   PLAN FOR NEXT SESSION: Follow-up with lumbar traction   4:06 PM, 04/19/24 Olivia Church, DPT Physical Therapy with [place]

## 2024-04-21 ENCOUNTER — Ambulatory Visit (INDEPENDENT_AMBULATORY_CARE_PROVIDER_SITE_OTHER): Admitting: Physical Therapy

## 2024-04-21 ENCOUNTER — Encounter: Payer: Self-pay | Admitting: Physical Therapy

## 2024-04-21 DIAGNOSIS — M5412 Radiculopathy, cervical region: Secondary | ICD-10-CM | POA: Diagnosis not present

## 2024-04-21 DIAGNOSIS — M542 Cervicalgia: Secondary | ICD-10-CM | POA: Diagnosis not present

## 2024-04-21 DIAGNOSIS — M5459 Other low back pain: Secondary | ICD-10-CM

## 2024-04-21 DIAGNOSIS — M6281 Muscle weakness (generalized): Secondary | ICD-10-CM | POA: Diagnosis not present

## 2024-04-21 NOTE — Therapy (Signed)
 OUTPATIENT PHYSICAL THERAPY CERVICAL TREATMENT   Patient Name: Jesse Wong MRN: [id number] DOB:[birth year], 43 y.o., male Today's Date: 04/21/2024  END OF SESSION:  PT End of Session - 04/21/24 1214     Visit Number 5    Number of Visits 16    Date for PT Re-Evaluation 06/15/24    PT Start Time 1218    PT Stop Time 1300    PT Time Calculation (min) 42 min    Activity Tolerance Patient limited by pain    Behavior During Therapy [facility] for tasks assessed/performed           Past Medical History:  Diagnosis Date   Allergy    Arthritis    Asthma    Chronic prostatitis    Myalgia    Nose injury, initial encounter 06/16/2020   Pulmonary infiltrates    Scrotal pain 01/08/2023   Sinusitis, chronic    Tonsillar abscess    Vasculitis (HCC)    Past Surgical History:  Procedure Laterality Date   BRONCHOSCOPY  2011   TBBX ----inflammation   IR FLUORO GUIDE CV LINE RIGHT  01/31/2017   IR FLUORO GUIDE CV MIDLINE PICC RIGHT  01/30/2017   IR US  GUIDE VASC ACCESS RIGHT  01/30/2017   peritonsilar abscess     TYMPANOPLASTY     VIDEO BRONCHOSCOPY Bilateral 06/25/2018   Procedure: VIDEO BRONCHOSCOPY WITHOUT FLUORO;  Surgeon: Shellia Oh, MD;  Location: [facility];  Service: Cardiopulmonary;  Laterality: Bilateral;   Patient Active Problem List   Diagnosis Date Noted   History of vertebral compression fracture 10/28/2023   Testicular swelling, right 01/08/2023   Radiculitis of left cervical region 12/30/2022   Left lumbar radiculitis 12/30/2022   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   Long term current use of systemic steroids 05/17/2021   Closed fracture of nasal bones 06/19/2020   Coccygeal pain 06/16/2020   Atherosclerosis of aorta (HCC) 04/12/2019   Pulmonary infiltrates    Medication monitoring encounter 03/03/2018   Pulmonary nodule seen on imaging study 01/15/2018   Microscopic polyangiitis (HCC) 03/14/2016   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    Dyshidrotic eczema 04/08/2014   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   MYALGIA 06/28/2010   GERD 06/27/2010   Nonspecific (abnormal) findings on radiological and other examination of body structure 05/29/2010   CT, CHEST, ABNORMAL 05/29/2010   Asthma with bronchitis 04/09/2010   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    PCP: Alvan Dorothyann BIRCH, MD  REFERRING PROVIDER: Alvan Dorothyann BIRCH, MD  REFERRING DIAG: M54.12 (ICD-10-CM) - Radiculitis of left cervical region  THERAPY DIAG:  Cervicalgia  Muscle weakness (generalized)  Left cervical radiculopathy  Other low back pain  Rationale for Evaluation and Treatment: Rehabilitation  ONSET DATE: a couple months ago.   SUBJECTIVE:  SUBJECTIVE STATEMENT: 04/21/2024 States bending over bothers him back. States she tries to gently stretch. States he has stopped the NSAIDS but still taking gaba, lifting up his right leg causes his back pain.   EVAL:Patient reports that overall he was doing pretty well after he left physical therapy in February of this year.  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  Reports that he is able utilize the things he learned to help calm his symptoms down, but this last episode of neck pain required additional help.  Reports that he is back on GABA whereas he had weaned completely off of it earlier this year.  Reports his pain was so bad that at times he could not find a comfortable position and was fearful of driving his truck down the driveway due to concerns.  Reports that he had an injection scheduled but then he started feeling better so he canceled it.  Reports that his pain returned so he now has an injection scheduled for later this  week.  Reports he had an MRI done the other day but it has not been read.  Reports overall he wants to make sure he is doing what he needs to to continue to work on managing his neck symptoms and is realizing he may need to use injections as another way to manage his pain.  Reports he is going to [facility] in [place]  for a week and a half next week and 1 to make sure he has ways to address his symptoms so he can enjoy his vacation.  Reports he does not know if sleep contributes to his symptoms but feels that when he tosses and turns that sometimes his pain increases.  He has had to change his sleep posture due to pain.     PERTINENT HISTORY:  Osteoporosis, Wegener's granulomatosis (long term steroid use), psoriatic arthritis, T7 compression fracture   PAIN:  Are you having pain? Yes: NPRS scale: 2/10 Pain location: low back  Pain description: dull achy Aggravating factors: sleeping, certain movements and postures  Relieving factors: stretches,   PRECAUTIONS: None  RED FLAGS: None     WEIGHT BEARING RESTRICTIONS: No  FALLS:  Has patient fallen in last 6 months? No  LIVING ENVIRONMENT: Lives with: lives with their family Lives in: House/apartment    OCCUPATION: Tax adviser   PLOF: Independent  PATIENT GOALS: To be able to manage his pain   OBJECTIVE:  Note: Objective measures were completed at Evaluation unless otherwise noted.  DIAGNOSTIC FINDINGS:  Recent MRI 03/21/24 - awaiting results  PATIENT SURVEYS:  Patient-specific activity functional scoring scheme (Point to one number):  0 represents "unable to perform." 10 represents "able to perform at prior level. 0 1 2 3 4 5 6 7 8 9  10 (Date and Score) Activity Initial  Activity Eval     Sitting for an hour   2    Driving truck 2     walking 2    Additional Additional Total score = sum of the activity scores/number of activities Minimum detectable change (90%CI) for average score = 2 points Minimum  detectable change (90%CI) for single activity score = 3 points PSFS developed by: Rosalee MYRTIS Marvis KYM Charlet CHRISTELLA., & Binkley, J. (1995). Assessing disability and change on individual  patients: a report of a patient specific measure. Physiotherapy [place], 47, [phone]. Reproduced with the permission of the authors  Score: 2  COGNITION: Overall cognitive status: Within functional limits for tasks assessed  SENSATION: Not tested  POSTURE: Forward head posture with head tilted to right side at rest, feet crossed and sacral sitting  PALPATION: Tenderness to palpation and hypomobility noted throughout cervical spine right greater than left   CERVICAL ROM:   Active ROM From 12/15/23 A/PROM (deg) eval  Flexion [facility] WFL*  Extension WFL 20  Right lateral flexion [facility] WFL*  Left lateral flexion WFL* 50% limited*  Right rotation [facility]  Left rotation WFL* 25% limited*   (Blank rows = not tested) *pain                     Upper Extremity Right 12/15/23 Left 12/15/23 Right  EVAL 03/22/24 Left  EVAL 03/22/24    A/PROM MMT A/PROM MMT AROM MMT AROM MMT  Shoulder Flexion 170 4+ 170 4+ 170 4+ 170 4  Shoulder Extension                  Shoulder Abduction  WFL  4+ WFL  4+* WFL 4+ WFL 4  Shoulder Adduction                  Shoulder Internal Rotation Reaches to T6 SP   5  Reaches to T4 SP* 5      Shoulder External Rotation Reaches to T4 SP   5 Reaches to T2 SP*    5      Elbow Flexion                  Elbow Extension                  Wrist Flexion                  Wrist Extension                  Wrist Supination                  Wrist Pronation                  Wrist Ulnar Deviation                  Wrist Radial Deviation                  Grip Strength NA   NA                                  (Blank rows = not tested)                       * pain in lower back (and in shoulders) - hinges at lower T spine with shoulder flexion                          ** increased neuropathy       FUNCTIONAL TESTS:  Pain with all transitional movements and laying supine with and without  TREATMENT DATE:  04/21/2024  Therapeutic Exercise:   Reviewed HEP and answered all questions   Supine:  lumbar extension over towel in hook lying - multiple levels --> progressed to wedge under tailbone with padding - 2 minutes each section 18 minutes total  Prone: PROM of hips B  Seated:   Standing: Neuromuscular Re-education: long exhale breathing with movement to engage core - practiced with hip flexion (pain provoking motion. 18 minutes Manual Therapy:   Therapeutic Activity: Self Care:  Trigger Point Dry Needling:  Modalities:     PATIENT EDUCATION:  Education details: on  HEP and anatomy Person educated: Patient Education method: Explanation, Demonstration, and Handouts Education comprehension: verbalized understanding   HOME EXERCISE PROGRAM:  previous interventions: box breathing/pain relief strategies, vacuum, long exhale, prone traction with right rotation positional relief [id number]    ASSESSMENT:  CLINICAL IMPRESSION: 04/21/2024 Focused on gentle lumbar and sacral mobilization performed with positions and use of towel/wedge. Tolerated moderately well. Reduced core activation noted with hip flexion, this improved with focus on breathing forceful when executing hip flexion and pain reduced. Slight soreness end of session but no increase in pain. Will continue with current POC as tolerated.   EVAL: Patient is known to this clinic secondary to previous treatment of low back and neck pain.  Patient was released from physical therapy earlier this year as he was managing his system with an extensive home program.  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  Pain has been so bad that at times he cannot drive or  required daily tasks.  Overall symptoms have come back down but patient is on all of his previous medication.  Patient presents with limitations in posture, range of motion and strength that are greatly contributing to current presentation.  Patient also has ongoing inflammatory conditions likely contributing to current presentation as well as history of episodic neck and back pain.  Discussed and educated patient and benefits of injections as well as current symptoms.  Patient would greatly benefit from skilled physical therapy to address severe pain and improve overall function and quality of life.  OBJECTIVE IMPAIRMENTS: decreased activity tolerance, decreased ROM, decreased strength, increased edema, impaired UE functional use, improper body mechanics, postural dysfunction, and pain.   ACTIVITY LIMITATIONS: carrying, lifting, bending, sitting, and sleeping  PARTICIPATION LIMITATIONS: cleaning, occupation, and yard work  PERSONAL FACTORS: Time since onset of injury/illness/exacerbation and 1-2 comorbidities: Osteoporosis, Wegener's granulomatosi are also affecting patient's functional outcome.   REHAB POTENTIAL: Good  CLINICAL DECISION MAKING: Evolving/moderate complexity  EVALUATION COMPLEXITY: Moderate   GOALS: Goals reviewed with patient? yes  SHORT TERM GOALS: Target date: 05/04/2024  Patient will be independent in self management strategies to improve quality of life and functional outcomes. Baseline: New Program Goal status: INITIAL  2.  Patient will report at least 50% improvement in overall symptoms and/or function to demonstrate improved functional mobility Baseline: 0% better Goal status: INITIAL  3.  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  Patient will score at least 2 points higher on PSFS average to demonstrate change in overall function. Baseline: see above Goal status: INITIAL  3.  Patient will demonstrate within  functional range of motion of cervical neck in all directions Baseline: See above Goal status: INITIAL      PLAN:  PT FREQUENCY: 1-2x/week for a total of 16 visits over 12 week certification period  PT DURATION: 12 weeks  PLANNED INTERVENTIONS: 97110-Therapeutic exercises, 97530- Therapeutic activity, 97112- Neuromuscular re-education, 97535- Self Care, 97140- Manual therapy, [phone]- Gait training, [id number]- Orthotic Fit/training, 04007- Canalith repositioning, [id number]- Aquatic Therapy, 97014- Electrical stimulation (unattended), [phone]- Ionotophoresis 4mg /ml Dexamethasone, Patient/Family education, Balance training, Stair training, Taping, Dry Needling, Joint mobilization, Joint manipulation, Spinal manipulation, Spinal mobilization, Cryotherapy, and Moist heat   PLAN FOR NEXT SESSION: Follow-up with lumbar traction   2:00 PM, 04/21/24 Olivia Church, DPT Physical Therapy with [facility]

## 2024-04-22 ENCOUNTER — Other Ambulatory Visit: Payer: Self-pay | Admitting: Internal Medicine

## 2024-04-26 ENCOUNTER — Ambulatory Visit (INDEPENDENT_AMBULATORY_CARE_PROVIDER_SITE_OTHER): Admitting: Physical Therapy

## 2024-04-26 ENCOUNTER — Encounter: Payer: Self-pay | Admitting: Physical Therapy

## 2024-04-26 DIAGNOSIS — M6281 Muscle weakness (generalized): Secondary | ICD-10-CM | POA: Diagnosis not present

## 2024-04-26 DIAGNOSIS — M5412 Radiculopathy, cervical region: Secondary | ICD-10-CM | POA: Diagnosis not present

## 2024-04-26 DIAGNOSIS — M542 Cervicalgia: Secondary | ICD-10-CM

## 2024-04-26 NOTE — Therapy (Signed)
 OUTPATIENT PHYSICAL THERAPY CERVICAL TREATMENT   Patient Name: Jesse Wong MRN: [id number] DOB:[birth year], 43 y.o., male Today's Date: 04/26/2024  END OF SESSION:  PT End of Session - 04/26/24 1021     Visit Number 6    Number of Visits 16    Date for PT Re-Evaluation 06/15/24    PT Start Time 1021    PT Stop Time 1056    PT Time Calculation (min) 35 min    Activity Tolerance Patient limited by pain    Behavior During Therapy [facility] for tasks assessed/performed           Past Medical History:  Diagnosis Date   Allergy    Arthritis    Asthma    Chronic prostatitis    Myalgia    Nose injury, initial encounter 06/16/2020   Pulmonary infiltrates    Scrotal pain 01/08/2023   Sinusitis, chronic    Tonsillar abscess    Vasculitis (HCC)    Past Surgical History:  Procedure Laterality Date   BRONCHOSCOPY  2011   TBBX ----inflammation   IR FLUORO GUIDE CV LINE RIGHT  01/31/2017   IR FLUORO GUIDE CV MIDLINE PICC RIGHT  01/30/2017   IR US  GUIDE VASC ACCESS RIGHT  01/30/2017   peritonsilar abscess     TYMPANOPLASTY     VIDEO BRONCHOSCOPY Bilateral 06/25/2018   Procedure: VIDEO BRONCHOSCOPY WITHOUT FLUORO;  Surgeon: Shellia Oh, MD;  Location: [facility];  Service: Cardiopulmonary;  Laterality: Bilateral;   Patient Active Problem List   Diagnosis Date Noted   History of vertebral compression fracture 10/28/2023   Testicular swelling, right 01/08/2023   Radiculitis of left cervical region 12/30/2022   Left lumbar radiculitis 12/30/2022   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   Long term current use of systemic steroids 05/17/2021   Closed fracture of nasal bones 06/19/2020   Coccygeal pain 06/16/2020   Atherosclerosis of aorta (HCC) 04/12/2019   Pulmonary infiltrates    Medication monitoring encounter 03/03/2018   Pulmonary nodule seen on imaging study 01/15/2018   Microscopic polyangiitis (HCC) 03/14/2016   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    Dyshidrotic eczema 04/08/2014   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   SUBJECTIVE:  SUBJECTIVE STATEMENT: 04/26/2024 Feeling good. Did a lot this weekend. Able to work outside and had a few moments where he thought he was going to have pain but no current issues. Lower back feels better. Felt pretty good after last session.      EVAL:Patient reports that overall he was doing pretty well after he left physical therapy in February of this year.  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  Reports that he is able utilize the things he learned to help calm his symptoms down, but this last episode of neck pain required additional help.  Reports that he is back on GABA whereas he had weaned completely off of it earlier this year.  Reports his pain was so bad that at times he could not find a comfortable position and was fearful of driving his truck down the driveway due to concerns.  Reports that he had an injection scheduled but then he started feeling better so he canceled it.  Reports that his pain returned so he now has an injection scheduled for  later this week.  Reports he had an MRI done the other day but it has not been read.  Reports overall he wants to make sure he is doing what he needs to to continue to work on managing his neck symptoms and is realizing he may need to use injections as another way to manage his pain.  Reports he is going to [facility] in [place]  for a week and a half next week and 1 to make sure he has ways to address his symptoms so he can enjoy his vacation.  Reports he does not know if sleep contributes to his symptoms but feels that when he tosses and turns that sometimes his pain increases.  He has had to change his sleep posture due to pain.     PERTINENT HISTORY:  Osteoporosis, Wegener's granulomatosis (long term steroid use), psoriatic arthritis, T7 compression fracture   PAIN:  Are you having pain? Yes: NPRS scale: 2/10 Pain location: low back  Pain description: dull achy Aggravating factors: sleeping, certain movements and postures  Relieving factors: stretches,   PRECAUTIONS: None  RED FLAGS: None     WEIGHT BEARING RESTRICTIONS: No  FALLS:  Has patient fallen in last 6 months? No  LIVING ENVIRONMENT: Lives with: lives with their family Lives in: House/apartment    OCCUPATION: Tax adviser   PLOF: Independent  PATIENT GOALS: To be able to manage his pain   OBJECTIVE:  Note: Objective measures were completed at Evaluation unless otherwise noted.  DIAGNOSTIC FINDINGS:  Recent MRI 03/21/24 - awaiting results  PATIENT SURVEYS:  Patient-specific activity functional scoring scheme (Point to one number):  0 represents "unable to perform." 10 represents "able to perform at prior level. 0 1 2 3 4 5 6 7 8 9  10 (Date and Score) Activity Initial  Activity Eval     Sitting for an hour   2    Driving truck 2     walking 2    Additional Additional Total score = sum of the activity scores/number of activities Minimum detectable change (90%CI) for average score = 2  points Minimum detectable change (90%CI) for single activity score = 3 points PSFS developed by: Rosalee MYRTIS Marvis KYM Charlet CHRISTELLA., & Binkley, J. (1995). Assessing disability and change on individual  patients: a report of a patient specific measure. Physiotherapy [place], 47,  [phone]. Reproduced with the permission of the authors  Score: 2   COGNITION: Overall cognitive status: Within functional limits for tasks assessed  SENSATION: Not tested  POSTURE: Forward head posture with head tilted to right side at rest, feet crossed and sacral sitting  PALPATION: Tenderness to palpation and hypomobility noted throughout cervical spine right greater than left   CERVICAL ROM:   Active ROM From 12/15/23 A/PROM (deg) eval  Flexion [facility] WFL*  Extension WFL 20  Right lateral flexion [facility] WFL*  Left lateral flexion WFL* 50% limited*  Right rotation [facility]  Left rotation WFL* 25% limited*   (Blank rows = not tested) *pain                     Upper Extremity Right 12/15/23 Left 12/15/23 Right  EVAL 03/22/24 Left  EVAL 03/22/24    A/PROM MMT A/PROM MMT AROM MMT AROM MMT  Shoulder Flexion 170 4+ 170 4+ 170 4+ 170 4  Shoulder Extension                  Shoulder Abduction  WFL  4+ WFL  4+* WFL 4+ WFL 4  Shoulder Adduction                  Shoulder Internal Rotation Reaches to T6 SP   5  Reaches to T4 SP* 5      Shoulder External Rotation Reaches to T4 SP   5 Reaches to T2 SP*    5      Elbow Flexion                  Elbow Extension                  Wrist Flexion                  Wrist Extension                  Wrist Supination                  Wrist Pronation                  Wrist Ulnar Deviation                  Wrist Radial Deviation                  Grip Strength NA   NA                                  (Blank rows = not tested)                       * pain in lower back (and in shoulders) - hinges at lower T spine with shoulder flexion                          ** increased  neuropathy      FUNCTIONAL TESTS:  Pain with all transitional movements and laying supine with and without  TREATMENT DATE:  04/26/2024  Therapeutic Exercise:   Reviewed HEP and answered all questions   Supine:  lumbar extension over towel in hook lying - --> progressed to wedge under tailbone with padding - 2 minutes each section Prone  Seated:   Standing: Neuromuscular Re-education Manual Therapy:  STM and cervical mobilization  grade II/III tolerated well, cervical traction  Therapeutic Activity: Self Care:  Trigger Point Dry Needling:  Modalities:     PATIENT EDUCATION:  Education details: on  HEP and anatomy Person educated: Patient Education method: Explanation, Demonstration, and Handouts Education comprehension: verbalized understanding   HOME EXERCISE PROGRAM:  previous interventions: box breathing/pain relief strategies, vacuum, long exhale, prone traction with right rotation positional relief [id number]    ASSESSMENT:  CLINICAL IMPRESSION: 04/26/2024 Continued to focus on manual work which was tolerated well. Reviewed HEP and answered all questions. Reduced tension noted end of session. Overall patient doing well and will continue to benefit from skilled PT at this time.   EVAL: Patient is known to this clinic secondary to previous treatment of low back and neck pain.  Patient was released from physical therapy earlier this year as he was managing his system with an extensive home program.  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  Pain has been so bad that at times he cannot drive or required daily tasks.  Overall symptoms have come back down but patient is on all of his previous medication.  Patient presents with limitations in posture, range of motion and strength that are greatly contributing  to current presentation.  Patient also has ongoing inflammatory conditions likely contributing to current presentation as well as history of episodic neck and back pain.  Discussed and educated patient and benefits of injections as well as current symptoms.  Patient would greatly benefit from skilled physical therapy to address severe pain and improve overall function and quality of life.  OBJECTIVE IMPAIRMENTS: decreased activity tolerance, decreased ROM, decreased strength, increased edema, impaired UE functional use, improper body mechanics, postural dysfunction, and pain.   ACTIVITY LIMITATIONS: carrying, lifting, bending, sitting, and sleeping  PARTICIPATION LIMITATIONS: cleaning, occupation, and yard work  PERSONAL FACTORS: Time since onset of injury/illness/exacerbation and 1-2 comorbidities: Osteoporosis, Wegener's granulomatosi are also affecting patient's functional outcome.   REHAB POTENTIAL: Good  CLINICAL DECISION MAKING: Evolving/moderate complexity  EVALUATION COMPLEXITY: Moderate   GOALS: Goals reviewed with patient? yes  SHORT TERM GOALS: Target date: 05/04/2024  Patient will be independent in self management strategies to improve quality of life and functional outcomes. Baseline: New Program Goal status: INITIAL  2.  Patient will report at least 50% improvement in overall symptoms and/or function to demonstrate improved functional mobility Baseline: 0% better Goal status: INITIAL  3.  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  Patient will score at least 2 points higher on PSFS average to demonstrate change in overall function. Baseline: see above Goal  status: INITIAL  3.  Patient will demonstrate within functional range of motion of cervical neck in all directions Baseline: See above Goal status: INITIAL      PLAN:  PT FREQUENCY: 1-2x/week for a total of 16 visits over 12 week  certification period  PT DURATION: 12 weeks  PLANNED INTERVENTIONS: 97110-Therapeutic exercises, 97530- Therapeutic activity, 97112- Neuromuscular re-education, [phone]- Self Care, [id number]- Manual therapy, [phone]- Gait training, [phone]- Orthotic Fit/training, [phone]- Canalith repositioning, [id number]- Aquatic Therapy, [id number]- Electrical stimulation (unattended), [phone]- Ionotophoresis 4mg /ml Dexamethasone, Patient/Family education, Balance training, Stair training, Taping, Dry Needling, Joint mobilization, Joint manipulation, Spinal manipulation, Spinal mobilization, Cryotherapy, and Moist heat   PLAN FOR NEXT SESSION: Follow-up with lumbar traction   11:18 AM, 04/26/24 Olivia Church, DPT Physical Therapy with [place]

## 2024-04-28 ENCOUNTER — Ambulatory Visit (INDEPENDENT_AMBULATORY_CARE_PROVIDER_SITE_OTHER): Admitting: Physical Therapy

## 2024-04-28 ENCOUNTER — Encounter: Payer: Self-pay | Admitting: Physical Therapy

## 2024-04-28 DIAGNOSIS — M542 Cervicalgia: Secondary | ICD-10-CM

## 2024-04-28 DIAGNOSIS — M5412 Radiculopathy, cervical region: Secondary | ICD-10-CM

## 2024-04-28 DIAGNOSIS — M6281 Muscle weakness (generalized): Secondary | ICD-10-CM | POA: Diagnosis not present

## 2024-04-28 NOTE — Therapy (Signed)
 OUTPATIENT PHYSICAL THERAPY CERVICAL TREATMENT     Patient Name: Jesse Wong MRN: [id number] DOB:1980-12-20, 43 y.o., male Today's Date: 04/28/2024  END OF SESSION:  PT End of Session - 04/28/24 1346     Visit Number 7    Number of Visits 16    Date for PT Re-Evaluation 06/15/24    PT Start Time 1347    PT Stop Time 1428    PT Time Calculation (min) 41 min    Activity Tolerance Patient limited by pain    Behavior During Therapy [facility] for tasks assessed/performed           Past Medical History:  Diagnosis Date   Allergy    Arthritis    Asthma    Chronic prostatitis    Myalgia    Nose injury, initial encounter 06/16/2020   Pulmonary infiltrates    Scrotal pain 01/08/2023   Sinusitis, chronic    Tonsillar abscess    Vasculitis (HCC)    Past Surgical History:  Procedure Laterality Date   BRONCHOSCOPY  2011   TBBX ----inflammation   IR FLUORO GUIDE CV LINE RIGHT  01/31/2017   IR FLUORO GUIDE CV MIDLINE PICC RIGHT  01/30/2017   IR US  GUIDE VASC ACCESS RIGHT  01/30/2017   peritonsilar abscess     TYMPANOPLASTY     VIDEO BRONCHOSCOPY Bilateral 06/25/2018   Procedure: VIDEO BRONCHOSCOPY WITHOUT FLUORO;  Surgeon: Shellia Oh, MD;  Location: [facility];  Service: Cardiopulmonary;  Laterality: Bilateral;   Patient Active Problem List   Diagnosis Date Noted   History of vertebral compression fracture 10/28/2023   Testicular swelling, right 01/08/2023   Radiculitis of left cervical region 12/30/2022   Left lumbar radiculitis 12/30/2022   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   Long term current use of systemic steroids 05/17/2021   Closed fracture of nasal bones 06/19/2020   Coccygeal pain 06/16/2020   Atherosclerosis of aorta (HCC) 04/12/2019   Pulmonary infiltrates    Medication monitoring encounter 03/03/2018   Pulmonary nodule seen on imaging study 01/15/2018   Microscopic polyangiitis (HCC) 03/14/2016   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    Dyshidrotic eczema 04/08/2014   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   SUBJECTIVE:  SUBJECTIVE STATEMENT: 04/28/2024 Feeling good. Did a lot this weekend. Able to work outside and had a few moments where he thought he was going to have pain but no current issues. Lower back feels better. Felt pretty good after last session.      EVAL:Patient reports that overall he was doing pretty well after he left physical therapy in February of this year.  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  Reports that he is able utilize the things he learned to help calm his symptoms down, but this last episode of neck pain required additional help.  Reports that he is back on GABA whereas he had weaned completely off of it earlier this year.  Reports his pain was so bad that at times he could not find a comfortable position and was fearful of driving his truck down the driveway due to concerns.  Reports that he had an injection scheduled but then he started feeling better so he canceled it.  Reports that his pain returned so he now has an injection scheduled for  later this week.  Reports he had an MRI done the other day but it has not been read.  Reports overall he wants to make sure he is doing what he needs to to continue to work on managing his neck symptoms and is realizing he may need to use injections as another way to manage his pain.  Reports he is going to [facility] in [place]  for a week and a half next week and 1 to make sure he has ways to address his symptoms so he can enjoy his vacation.  Reports he does not know if sleep contributes to his symptoms but feels that when he tosses and turns that sometimes his pain increases.  He has had to change his sleep posture due to pain.     PERTINENT HISTORY:  Osteoporosis, Wegener's granulomatosis (long term steroid use), psoriatic arthritis, T7 compression fracture   PAIN:  Are you having pain? Yes: NPRS scale: 2/10 Pain location: low back  Pain description: dull achy Aggravating factors: sleeping, certain movements and postures  Relieving factors: stretches,   PRECAUTIONS: None  RED FLAGS: None     WEIGHT BEARING RESTRICTIONS: No  FALLS:  Has patient fallen in last 6 months? No  LIVING ENVIRONMENT: Lives with: lives with their family Lives in: House/apartment    OCCUPATION: Tax adviser   PLOF: Independent  PATIENT GOALS: To be able to manage his pain   OBJECTIVE:  Note: Objective measures were completed at Evaluation unless otherwise noted.  DIAGNOSTIC FINDINGS:  Recent MRI 03/21/24 - awaiting results  PATIENT SURVEYS:  Patient-specific activity functional scoring scheme (Point to one number):  0 represents "unable to perform." 10 represents "able to perform at prior level. 0 1 2 3 4 5 6 7 8 9  10 (Date and Score) Activity Initial  Activity Eval     Sitting for an hour   2    Driving truck 2     walking 2    Additional Additional Total score = sum of the activity scores/number of activities Minimum detectable change (90%CI) for average score = 2  points Minimum detectable change (90%CI) for single activity score = 3 points PSFS developed by: Rosalee MYRTIS Marvis KYM Charlet CHRISTELLA., & Binkley, J. (1995). Assessing disability and change on individual  patients: a report of a patient specific measure. Physiotherapy [place], 47,  [phone]. Reproduced with the permission of the authors  Score: 2   COGNITION: Overall cognitive status: Within functional limits for tasks assessed  SENSATION: Not tested  POSTURE: Forward head posture with head tilted to right side at rest, feet crossed and sacral sitting  PALPATION: Tenderness to palpation and hypomobility noted throughout cervical spine right greater than left   CERVICAL ROM:   Active ROM From 12/15/23 A/PROM (deg) eval  Flexion [facility] WFL*  Extension WFL 20  Right lateral flexion [facility] WFL*  Left lateral flexion WFL* 50% limited*  Right rotation [facility]  Left rotation WFL* 25% limited*   (Blank rows = not tested) *pain                     Upper Extremity Right 12/15/23 Left 12/15/23 Right  EVAL 03/22/24 Left  EVAL 03/22/24    A/PROM MMT A/PROM MMT AROM MMT AROM MMT  Shoulder Flexion 170 4+ 170 4+ 170 4+ 170 4  Shoulder Extension                  Shoulder Abduction  WFL  4+ WFL  4+* WFL 4+ WFL 4  Shoulder Adduction                  Shoulder Internal Rotation Reaches to T6 SP   5  Reaches to T4 SP* 5      Shoulder External Rotation Reaches to T4 SP   5 Reaches to T2 SP*    5      Elbow Flexion                  Elbow Extension                  Wrist Flexion                  Wrist Extension                  Wrist Supination                  Wrist Pronation                  Wrist Ulnar Deviation                  Wrist Radial Deviation                  Grip Strength NA   NA                                  (Blank rows = not tested)                       * pain in lower back (and in shoulders) - hinges at lower T spine with shoulder flexion                          ** increased  neuropathy      FUNCTIONAL TESTS:  Pain with all transitional movements and laying supine with and without  TREATMENT DATE:  04/28/2024  Therapeutic Exercise:   Reviewed HEP and answered all questions about HEP and plan moving forward   Supine:   Prone  Seated:   Standing: Neuromuscular Re-education Manual Therapy:  STM  to glute meds/lumbar paraspinals and QL, lumbar mobilization  grade II/III tolerated well  Therapeutic Activity: Self Care:  Trigger Point Dry Needling:  Modalities:     PATIENT EDUCATION:  Education details: on  HEP and anatomy Person educated: Patient Education method: Explanation, Demonstration, and Handouts Education comprehension: verbalized understanding   HOME EXERCISE PROGRAM:  previous interventions: box breathing/pain relief strategies, vacuum, long exhale, prone traction with right rotation positional relief [id number]    ASSESSMENT:  CLINICAL IMPRESSION: 04/28/2024 Patient doing well. Focused on lumbar spine and tolerated traction and manual interventions well. Reviewed entire HEP and answered all questions. Patient to DC to HEP secondary to feeling better and wanting to try to manage symptoms at home with current program.   EVAL: Patient is known to this clinic secondary to previous treatment of low back and neck pain.  Patient was released from physical therapy earlier this year as he was managing his system with an extensive home program.  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  Pain has been so bad that at times he cannot drive or required daily tasks.  Overall symptoms have come back down but patient is on all of his previous medication.  Patient presents with limitations in posture, range of motion and strength that are greatly contributing to current presentation.   Patient also has ongoing inflammatory conditions likely contributing to current presentation as well as history of episodic neck and back pain.  Discussed and educated patient and benefits of injections as well as current symptoms.  Patient would greatly benefit from skilled physical therapy to address severe pain and improve overall function and quality of life.  OBJECTIVE IMPAIRMENTS: decreased activity tolerance, decreased ROM, decreased strength, increased edema, impaired UE functional use, improper body mechanics, postural dysfunction, and pain.   ACTIVITY LIMITATIONS: carrying, lifting, bending, sitting, and sleeping  PARTICIPATION LIMITATIONS: cleaning, occupation, and yard work  PERSONAL FACTORS: Time since onset of injury/illness/exacerbation and 1-2 comorbidities: Osteoporosis, Wegener's granulomatosi are also affecting patient's functional outcome.   REHAB POTENTIAL: Good  CLINICAL DECISION MAKING: Evolving/moderate complexity  EVALUATION COMPLEXITY: Moderate   GOALS: Goals reviewed with patient? yes  SHORT TERM GOALS: Target date: 05/04/2024  Patient will be independent in self management strategies to improve quality of life and functional outcomes. Baseline: New Program Goal status: INITIAL  2.  Patient will report at least 50% improvement in overall symptoms and/or function to demonstrate improved functional mobility Baseline: 0% better Goal status: INITIAL  3.  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  Patient will score at least 2 points higher on PSFS average to demonstrate change in overall function. Baseline: see above Goal status: INITIAL  3.   Patient will demonstrate within functional range of motion of cervical neck in all directions Baseline: See above Goal status: INITIAL      PLAN:  PT FREQUENCY: 1-2x/week for a total of 16 visits over 12 week certification period  PT DURATION:  12 weeks  PLANNED INTERVENTIONS: 97110-Therapeutic exercises, 97530- Therapeutic activity, [id number]- Neuromuscular re-education, [phone]- Self Care, [id number]- Manual therapy, [phone]- Gait training, [phone]- Orthotic Fit/training, [phone]- Canalith repositioning, [id number]- Aquatic Therapy, [id number]- Electrical stimulation (unattended), [phone]- Ionotophoresis 4mg /ml Dexamethasone, Patient/Family education, Balance training, Stair training, Taping, Dry Needling, Joint mobilization, Joint manipulation, Spinal manipulation, Spinal mobilization, Cryotherapy, and Moist heat   PLAN FOR NEXT SESSION: DC to HEP   3:26 PM, 04/28/24 Olivia Church, DPT Physical Therapy with [place]

## 2024-04-29 ENCOUNTER — Telehealth: Payer: Self-pay

## 2024-04-29 NOTE — Telephone Encounter (Signed)
 Auth Submission: APPROVED - Renewal Site of care: Site of care: [facility] Payer: [employer] commercial Medication & CPT/J Code(s) submitted: Truxima  (Rituximab -abbs) [id number] Diagnosis Code:  Route of submission (phone, fax, portal): portal Phone # Fax # Auth type: Buy/Bill PB Units/visits requested: 1000mg  x 3 doses Reference number: [id number] Approval from: 04/27/24 to 04/26/25

## 2024-06-01 ENCOUNTER — Other Ambulatory Visit: Payer: Self-pay | Admitting: "Endocrinology

## 2024-06-01 ENCOUNTER — Encounter: Payer: Self-pay | Admitting: "Endocrinology

## 2024-06-01 DIAGNOSIS — M818 Other osteoporosis without current pathological fracture: Secondary | ICD-10-CM

## 2024-06-08 ENCOUNTER — Encounter: Payer: Self-pay | Admitting: Family Medicine

## 2024-06-08 ENCOUNTER — Ambulatory Visit (INDEPENDENT_AMBULATORY_CARE_PROVIDER_SITE_OTHER): Admitting: Family Medicine

## 2024-06-08 VITALS — BP 116/79 | HR 65 | Ht 79.0 in | Wt 189.1 lb

## 2024-06-08 DIAGNOSIS — Z0000 Encounter for general adult medical examination without abnormal findings: Secondary | ICD-10-CM

## 2024-06-08 DIAGNOSIS — Z711 Person with feared health complaint in whom no diagnosis is made: Secondary | ICD-10-CM | POA: Diagnosis not present

## 2024-06-08 DIAGNOSIS — Z0184 Encounter for antibody response examination: Secondary | ICD-10-CM | POA: Diagnosis not present

## 2024-06-08 NOTE — Progress Notes (Signed)
 Complete physical exam  Patient: Jesse Wong   DOB: 1981-09-21   43 y.o. Male  MRN: [id number]  Subjective:    Chief Complaint  Patient presents with   Annual Exam    Jesse Wong is a 43 y.o. male who presents today for a complete physical exam. He reports consuming a general diet. The patient does not participate in regular exercise at present. Active on farm.  He generally feels well. He reports sleeping fairly well.   He is doing his Rituxan  every 6 months and has actually been pretty stable.  He is followed by endocrine for his osteoporosis caused by chronic steroids.  He has a DEXA scan coming up soon.  He does not have any skin concerns today.  He does note that when he does get sick it usually requires steroids in addition to the antibiotics sometimes he can get away with just a 5-day burst but sometimes does have to taper a little bit longer.  Overall feels like mood has been good but he has noticed that his attention to things and memory has not been quite as sharp.  Though there is been a lot of new people and really tough mental things going on.  So is not sure if maybe that is causing some of it.   Most recent fall risk assessment:    12/15/2023    3:53 PM  Fall Risk   Falls in the past year? 0     Most recent depression screenings:    06/04/2023    9:06 AM 10/24/2022    1:14 PM  PHQ 2/9 Scores  PHQ - 2 Score 0 0        Patient Care Team: Alvan Dorothyann BIRCH, MD as PCP - Diedre Lenon Faden, MD as Consulting Physician (Rheumatology) Neysa Reggy BIRCH, MD as Consulting Physician (Pulmonary Disease) Vara Olivia Fuller, PT (Inactive) as Physical Therapist (Physical Therapy)   Outpatient Medications Prior to Visit  Medication Sig   albuterol  (PROVENTIL ) (2.5 MG/3ML) 0.083% nebulizer solution Inhale 1 vial via nebulizer every 6 hours as needed for wheezing.   albuterol  (VENTOLIN  HFA) 108 (90 Base) MCG/ACT inhaler Inhale 2 puffs into the  lungs every 6 (six) hours as needed for wheezing or shortness of breath.   alendronate  (FOSAMAX ) 70 MG tablet Take 1 tablet (70 mg total) by mouth every 7 (seven) days. Take with a full glass of water on an empty stomach.   budesonide -formoterol  (SYMBICORT ) 160-4.5 MCG/ACT inhaler Inhale 2 puffs into the lungs 2 (two) times daily.   CALCIUM  PO Take 1 tablet by mouth 2 (two) times daily.   cholecalciferol (VITAMIN D ) 1000 UNITS tablet Take 1 tablet (1,000 Units total) by mouth daily.   Multiple Vitamin (MULTIVITAMIN WITH MINERALS) TABS tablet Take 1 tablet by mouth 2 (two) times daily.   Nebulizers (COMPRESSOR/NEBULIZER) MISC Use as direccted   omeprazole  (PRILOSEC) 20 MG capsule Take 1 capsule (20 mg total) by mouth daily.   pravastatin  (PRAVACHOL ) 40 MG tablet Take 1 tablet by mouth daily.   sodium chloride  HYPERTONIC 3 % nebulizer solution Take 3 mLs by nebulization 3 (three) times daily.   [DISCONTINUED] Abaloparatide  (TYMLOS ) 3120 MCG/1.56ML SOPN Inject 80 mcg into the skin daily.   [DISCONTINUED] Azelastine  HCl (ASTEPRO ) 0.15 % SOLN Place 1 spray into the nose 2 (two) times daily.   [DISCONTINUED] doxycycline  (VIBRA -TABS) 100 MG tablet Take 1 tablet (100 mg total) by mouth 2 (two) times daily.   [DISCONTINUED] fluticasone  (FLONASE ) 50  MCG/ACT nasal spray INSTILL 2 SPRAYS INTO BOTH NOSTRILS DAILY (NEED APPT FOR FURTHER REFILLS)   [DISCONTINUED] mupirocin  ointment (BACTROBAN ) 2 % Use with nasal rinse bottle   [DISCONTINUED] naproxen  (NAPROSYN ) 500 MG tablet Take 1 tablet (500 mg total) by mouth 2 (two) times daily with a meal.   [DISCONTINUED] pregabalin  (LYRICA ) 50 MG capsule Take 1 capsule (50 mg total) by mouth 3 (three) times daily.   Facility-Administered Medications Prior to Visit  Medication Dose Route Frequency Provider   diphenhydrAMINE  (BENADRYL ) injection 50 mg  50 mg Intramuscular Once PRN Causey, Morna Pickle, NP   EPINEPHrine  (EPI-PEN) injection 0.3 mg  0.3 mg Intramuscular  Once PRN Crawford Morna Pickle, NP    ROS        Objective:     BP 116/79   Pulse 65   Ht 6' 7 (2.007 m)   Wt 189 lb 1.3 oz (85.8 kg)   SpO2 97%   BMI 21.30 kg/m    Physical Exam Vitals and nursing note reviewed.  Constitutional:      Appearance: Normal appearance.  HENT:     Head: Normocephalic and atraumatic.     Right Ear: Tympanic membrane, ear canal and external ear normal.     Left Ear: Tympanic membrane, ear canal and external ear normal.     Nose: Nose normal.     Mouth/Throat:     Pharynx: Oropharynx is clear.  Eyes:     Extraocular Movements: Extraocular movements intact.     Conjunctiva/sclera: Conjunctivae normal.     Pupils: Pupils are equal, round, and reactive to light.  Neck:     Thyroid : No thyromegaly.  Cardiovascular:     Rate and Rhythm: Normal rate and regular rhythm.  Pulmonary:     Effort: Pulmonary effort is normal.     Breath sounds: Normal breath sounds.  Abdominal:     General: Bowel sounds are normal.     Palpations: Abdomen is soft.     Tenderness: There is no abdominal tenderness.  Musculoskeletal:        General: No swelling.     Cervical back: Neck supple.  Skin:    General: Skin is warm and dry.  Neurological:     Mental Status: He is alert and oriented to person, place, and time.  Psychiatric:        Mood and Affect: Mood normal.        Behavior: Behavior normal.      No results found for any visits on 06/08/24.     Assessment & Plan:    Routine Health Maintenance and Physical Exam  Immunization History  Administered Date(s) Administered   DTaP 07/06/2007   Influenza Split 07/26/2015, 07/21/2017   Influenza Whole 09/04/2009, 07/22/2019   Influenza,inj,Quad PF,6+ Mos 07/21/2022, 08/13/2023   Influenza-Unspecified 07/05/2013, 07/22/2014, 07/21/2018, 07/18/2020, 08/13/2021   PFIZER(Purple Top)SARS-COV-2 Vaccination 10/18/2019, 11/08/2019, 07/08/2020, 11/22/2021   PNEUMOCOCCAL CONJUGATE-20 10/11/2021    Pneumococcal Polysaccharide-23 03/11/2018   Td 01/20/2008   Tdap 01/25/2017   Tetanus 07/06/2007    Health Maintenance  Topic Date Due   HIV Screening  Never done   Hepatitis B Vaccines 19-59 Average Risk (1 of 3 - 19+ 3-dose series) Never done   HPV VACCINES (1 - 3-dose SCDM series) Never done   COVID-19 Vaccine (5 - 2024-25 season) 06/22/2023   INFLUENZA VACCINE  05/21/2024   DTaP/Tdap/Td (5 - Td or Tdap) 01/26/2027   Pneumococcal Vaccine  Completed   Hepatitis C Screening  Completed  Meningococcal B Vaccine  Aged Out    Discussed health benefits of physical activity, and encouraged him to engage in regular exercise appropriate for his age and condition.  Problem List Items Addressed This Visit       Other   Concern about memory   He says even when he was back in school he would noticed that he would not hear complete parts of lectures he have to reread passages and even now listening to a podcast he sometimes has to go back and listen to the same part multiple times to really process it.  He is not hyperactive but he is never been tested for ADD.      Other Visit Diagnoses       Wellness examination    -  Primary   Relevant Orders   CMP14+EGFR   Lipid panel   CBC   VITAMIN D  25 Hydroxy (Vit-D Deficiency, Fractures)   TSH   Hepatitis B Surface AntiBODY   Renal Function Panel     Immunity status testing       Relevant Orders   Hepatitis B Surface AntiBODY      Will check for immunity to hepatitis B.  Recommended to get his flu vaccine this fall.  Tdap is up-to-date.  Get updated labs will call with results once available endocrine wanted an up-to-date vitamin D  and renal panel so we will have that drawn today as well.  DEXA scan pending.  In regards to recurrent neck and spine pain-he has been doing really well lately feels like PT was well helped him more than anything the injection did not provide a great amount of relief.  He does a lot of exercises and self  treatment at home with breathing techniques etc.  No follow-ups on file.     Dorothyann Byars, MD

## 2024-06-08 NOTE — Assessment & Plan Note (Signed)
 He says even when he was back in school he would noticed that he would not hear complete parts of lectures he have to reread passages and even now listening to a podcast he sometimes has to go back and listen to the same part multiple times to really process it.  He is not hyperactive but he is never been tested for ADD.

## 2024-06-09 ENCOUNTER — Ambulatory Visit: Payer: Self-pay | Admitting: Family Medicine

## 2024-06-09 LAB — CBC
Hematocrit: 53.2 % — ABNORMAL HIGH (ref 37.5–51.0)
Hemoglobin: 17.7 g/dL (ref 13.0–17.7)
MCH: 32 pg (ref 26.6–33.0)
MCHC: 33.3 g/dL (ref 31.5–35.7)
MCV: 96 fL (ref 79–97)
Platelets: 286 x10E3/uL (ref 150–450)
RBC: 5.53 x10E6/uL (ref 4.14–5.80)
RDW: 12.7 % (ref 11.6–15.4)
WBC: 5.5 x10E3/uL (ref 3.4–10.8)

## 2024-06-09 LAB — CMP14+EGFR
ALT: 21 IU/L (ref 0–44)
AST: 18 IU/L (ref 0–40)
Albumin: 4.9 g/dL (ref 4.1–5.1)
Alkaline Phosphatase: 70 IU/L (ref 44–121)
BUN/Creatinine Ratio: 14 (ref 9–20)
BUN: 15 mg/dL (ref 6–24)
Bilirubin Total: 1.2 mg/dL (ref 0.0–1.2)
CO2: 25 mmol/L (ref 20–29)
Calcium: 10.3 mg/dL — ABNORMAL HIGH (ref 8.7–10.2)
Chloride: 98 mmol/L (ref 96–106)
Creatinine, Ser: 1.09 mg/dL (ref 0.76–1.27)
Globulin, Total: 2.5 g/dL (ref 1.5–4.5)
Glucose: 79 mg/dL (ref 70–99)
Potassium: 4.3 mmol/L (ref 3.5–5.2)
Sodium: 139 mmol/L (ref 134–144)
Total Protein: 7.4 g/dL (ref 6.0–8.5)
eGFR: 86 mL/min/1.73 (ref >59)

## 2024-06-09 LAB — LIPID PANEL
Chol/HDL Ratio: 4.6 ratio (ref 0.0–5.0)
Cholesterol, Total: 184 mg/dL (ref 100–199)
HDL: 40 mg/dL (ref >39)
LDL Chol Calc (NIH): 122 mg/dL — ABNORMAL HIGH (ref 0–99)
Triglycerides: 119 mg/dL (ref 0–149)
VLDL Cholesterol Cal: 22 mg/dL (ref 5–40)

## 2024-06-09 LAB — RENAL FUNCTION PANEL: Phosphorus: 3.6 mg/dL (ref 2.8–4.1)

## 2024-06-09 LAB — HEPATITIS B SURFACE ANTIBODY,QUALITATIVE: Hep B Surface Ab, Qual: NONREACTIVE

## 2024-06-09 LAB — TSH: TSH: 2.93 u[IU]/mL (ref 0.450–4.500)

## 2024-06-09 LAB — VITAMIN D 25 HYDROXY (VIT D DEFICIENCY, FRACTURES): Vit D, 25-Hydroxy: 50 ng/mL (ref 30.0–100.0)

## 2024-06-09 NOTE — Progress Notes (Signed)
 Hi BJ, metabolic panel overall looks good.  Calcium  was elevated by 10th of a point but not in a concerning range so you do not need to make any changes.  LDL cholesterol just slightly elevated.  Again not in the range that we would treat with a statin but just encourage you to continue to work on Mediterranean diet and regular exercise as tolerated.  Hemoglobin is stable.  Vitamin D  looks great.  Thyroid  level is normal.  Phosphorus is normal.  Did come back negative for the hep B surface antibody which means you are not actively immunized.  We are happy to get you updated with that vaccine here if you would like.  I would recommend going ahead and doing the Twinrix which protects you against hep A and hep B.

## 2024-06-10 NOTE — Telephone Encounter (Signed)
 Contact pt to schedule NV for twinrix series

## 2024-06-14 ENCOUNTER — Other Ambulatory Visit: Payer: Self-pay | Admitting: Family Medicine

## 2024-06-14 ENCOUNTER — Ambulatory Visit: Admitting: Internal Medicine

## 2024-06-14 ENCOUNTER — Other Ambulatory Visit: Payer: Self-pay | Admitting: "Endocrinology

## 2024-06-14 DIAGNOSIS — I700 Atherosclerosis of aorta: Secondary | ICD-10-CM

## 2024-06-15 ENCOUNTER — Other Ambulatory Visit (HOSPITAL_COMMUNITY): Payer: Self-pay

## 2024-06-15 ENCOUNTER — Other Ambulatory Visit: Payer: Self-pay

## 2024-06-15 MED ORDER — ALENDRONATE SODIUM 70 MG PO TABS
70.0000 mg | ORAL_TABLET | ORAL | 3 refills | Status: DC
  Filled 2024-06-15: qty 12, 84d supply, fill #0

## 2024-06-15 NOTE — Telephone Encounter (Signed)
 Requested Prescriptions   Pending Prescriptions Disp Refills   alendronate  (FOSAMAX ) 70 MG tablet 12 tablet 3    Sig: Take 1 tablet (70 mg total) by mouth every 7 (seven) days. Take with a full glass of water on an empty stomach.

## 2024-06-16 ENCOUNTER — Other Ambulatory Visit (HOSPITAL_COMMUNITY): Payer: Self-pay

## 2024-06-16 MED ORDER — PRAVASTATIN SODIUM 40 MG PO TABS
40.0000 mg | ORAL_TABLET | Freq: Every day | ORAL | 3 refills | Status: AC
  Filled 2024-06-16: qty 90, 90d supply, fill #0
  Filled 2024-09-02: qty 90, 90d supply, fill #1

## 2024-06-17 ENCOUNTER — Other Ambulatory Visit: Payer: Self-pay

## 2024-06-18 ENCOUNTER — Ambulatory Visit (INDEPENDENT_AMBULATORY_CARE_PROVIDER_SITE_OTHER): Admitting: Family Medicine

## 2024-06-18 VITALS — Temp 98.2°F

## 2024-06-18 DIAGNOSIS — Z23 Encounter for immunization: Secondary | ICD-10-CM | POA: Diagnosis not present

## 2024-06-18 NOTE — Progress Notes (Signed)
 Patient is in office today for a nurse visit for Immunization. Patient Injection was given in the  Left deltoid. Patient tolerated injection well.   Patient will RTC in 1 month for 2nd injection for next injection.

## 2024-06-22 ENCOUNTER — Encounter: Payer: Self-pay | Admitting: Sports Medicine

## 2024-07-07 ENCOUNTER — Ambulatory Visit (INDEPENDENT_AMBULATORY_CARE_PROVIDER_SITE_OTHER)

## 2024-07-07 DIAGNOSIS — M818 Other osteoporosis without current pathological fracture: Secondary | ICD-10-CM

## 2024-07-07 DIAGNOSIS — M810 Age-related osteoporosis without current pathological fracture: Secondary | ICD-10-CM | POA: Diagnosis not present

## 2024-07-09 ENCOUNTER — Ambulatory Visit: Payer: Self-pay | Admitting: Family Medicine

## 2024-07-09 NOTE — Progress Notes (Signed)
 Overall the bone density looks stable from 2 years ago which is good news they basically said that the rate of change from previous exam was not significant.  Just meeting that it was not changing in a negative direction.  In fact if you look at the numbers below I jotted down the ones from before and the ones now.  A couple areas actually technically look better.  The right femoral neck is the same.  But overall stable and maybe will call a slight improvement.  Recommend repeat again in 2 years.  Femur Neck Left   05/15/2022    42.1         -2.4   , now -1.7 so better  Femur Neck Right  05/15/2022   42.1         -2.4   , now -2.4 so same  Femur Total Right 05/15/2022    42.1         -2.4    , now -2.1 so better

## 2024-07-19 ENCOUNTER — Ambulatory Visit

## 2024-07-19 VITALS — Temp 98.7°F

## 2024-07-19 DIAGNOSIS — Z23 Encounter for immunization: Secondary | ICD-10-CM | POA: Diagnosis not present

## 2024-07-19 NOTE — Progress Notes (Signed)
 Pt here for his 2nd twinrix vaccine. Denies fever, chills, CP, or SOB. Pt given injection is in his RD tolerated well. No redness or swelling noted at the site. Pt advised to RTC around 12/19/23 for his 3rd dose.

## 2024-07-20 ENCOUNTER — Other Ambulatory Visit (HOSPITAL_COMMUNITY): Payer: Self-pay

## 2024-07-20 ENCOUNTER — Telehealth: Admitting: "Endocrinology

## 2024-07-20 VITALS — Ht 79.0 in | Wt 186.0 lb

## 2024-07-20 DIAGNOSIS — Z7983 Long term (current) use of bisphosphonates: Secondary | ICD-10-CM | POA: Diagnosis not present

## 2024-07-20 DIAGNOSIS — M8580 Other specified disorders of bone density and structure, unspecified site: Secondary | ICD-10-CM

## 2024-07-20 MED ORDER — ALENDRONATE SODIUM 70 MG PO TABS
70.0000 mg | ORAL_TABLET | ORAL | 3 refills | Status: AC
  Filled 2024-07-20 – 2024-09-02 (×2): qty 12, 84d supply, fill #0

## 2024-07-20 NOTE — Progress Notes (Signed)
 The patient reports they are currently: [name]. I spent 14-15 minutes on the video with the patient on the date of service. I spent an additional 10 minutes on pre- and post-visit activities on the date of service.   The patient was physically located in [place]  or a state in which I am permitted to provide care. The patient and/or parent/guardian understood that s/he may incur co-pays and cost sharing, and agreed to the telemedicine visit. The visit was reasonable and appropriate under the circumstances given the patient's presentation at the time.  The patient and/or parent/guardian has been advised of the potential risks and limitations of this mode of treatment (including, but not limited to, the absence of in-person examination) and has agreed to be treated using telemedicine. The patient's/patient's family's questions regarding telemedicine have been answered.   The patient and/or parent/guardian has also been advised to contact their provider's office for worsening conditions, and seek emergency medical treatment and/or call 911 if the patient deems either necessary.     [facility] Note Jesse Birmingham, MD    Referring Provider: Alvan Dorothyann Wong, * Primary Care Provider: Alvan Dorothyann BIRCH, MD No chief complaint on file.  Assessment & Plan  Diagnoses and all orders for this visit:  Decreased bone density  History of ongoing treatment with alendronate  (Fosamax )  Other orders -     alendronate  (FOSAMAX ) 70 MG tablet; Take 1 tablet (70 mg total) by mouth every 7 (seven) days. Take with a full glass of water on an empty stomach.    Osteoporosis Likely secondary cause from steroids intake. 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 Has used fosamax  years ago for 2 years. Started fosamax  on 03/19/23 with some GERD but didn't want reclast. Taking Cardomom pods for acid reflux as needed. Discussed other treatment options as  well including teriparatide, prolia and reclast and Forteo. 04/2022.  BMD stable/improved 2025 compared to 2023.  Next DEXA due in 06/2026.  Recommend to use calcium  carbonate 600 mg twice daily and vitamin D  2000 units OTC supplements.  Recommend weight bearing exercise options and dietary supplements.   No increased risk of osteosarcoma/ metabolic bone diseases including Paget's disease/bone metastases or history of skeletal malignancies  Next DXA in 05/2024  Educated on risks and side effects of fosamax  including but not limited to esophagitis, worsening GERD, atypical femoral fractures and osteonecrosis of the jaw. Advised to take medication first thing in the morning with plenty of water and stay upright for 30 minutes after taking the medication.  Advised fall precautions, adequate dairy in diet and exercises (aerobic, balancing and weight bearing) as tolerated.  Fine hand tremor noted B/L previously No thyroid  labs seen Baseline thyroid  labs WNL   Return in about 1 year (around 07/20/2025).  I have reviewed current medications, nurse's notes, allergies, vital signs, past medical and surgical history, family medical history, and social history for this encounter. Counseled patient on symptoms, examination findings, lab findings, imaging results, treatment decisions and monitoring and prognosis. The patient understood the recommendations and agrees with the treatment plan. All questions regarding treatment plan were fully answered.   Jesse Birmingham, MD   07/20/24   History of Present Illness Jesse Wong is a 43 y.o. year old male who presents to our clinic with osteoporosis diagnosed in 2015.   Finished physical therapy for spine and neck No new falls/fracture Taking fosamax  weekly without issues (Has used fosamax  years ago for 2 years. Started  fosamax  on 03/19/23 after 5 or so years) On Vit D 2000 units every day Taking Calcium  500mg  bid  Interested in learning more about  teriparatide    Z-scores: Femur Neck Left   05/15/2022   -2.4, 07/07/2024   -1.7  Femur Neck Right  05/15/2022  -2.4, 07/07/2024  -2.4  Femur Total Right 05/15/2022    -2.4, 07/07/2024  -2.1  Risk Factors screening:  History of low trauma fractures: No Family history of osteoporosis: Yes Hip fracture in first-degree relatives: No Smoking history: No Excessive alcohol intake >2 drinks/day: No Excessive caffeine intake >2 drinks/day: No Glucocorticoid use >5mg  prednisone /day for >3 months: Yes: has ANCA vasculitis, had 1 gm solumedrol previously   Rheumatoid arthritis history: No   Physical Exam  Ht 6' 7 (2.007 m)   Wt 186 lb (84.4 kg)   BMI 20.95 kg/m  Constitutional: well developed, well nourished Head: normocephalic, atraumatic Eyes: sclera anicteric, no redness Neck: supple, no thyromegaly/thyroid  tenderness/thyroid  nodule  Lungs: normal respiratory effort Neurology: alert and oriented Skin: dry, no appreciable rashes Musculoskeletal: no appreciable defects, + B/L find hand tremor  Psychiatric: normal mood and affect  Allergies Allergies  Allergen Reactions   Gabapentin  Other (See Comments)    Tinnitis, and hearing loss   Lipitor [Atorvastatin ] Other (See Comments)    myalgias   Ruxience  [Rituximab -Pvvr] Itching    Itching in back of throat and nose     Current Medications Patient's Medications  New Prescriptions   No medications on file  Previous Medications   ALBUTEROL  (PROVENTIL ) (2.5 MG/3ML) 0.083% NEBULIZER SOLUTION    Inhale 1 vial via nebulizer every 6 hours as needed for wheezing.   ALBUTEROL  (VENTOLIN  HFA) 108 (90 BASE) MCG/ACT INHALER    Inhale 2 puffs into the lungs every 6 (six) hours as needed for wheezing or shortness of breath.   BUDESONIDE -FORMOTEROL  (SYMBICORT ) 160-4.5 MCG/ACT INHALER    Inhale 2 puffs into the lungs 2 (two) times daily.   CALCIUM  PO    Take 1 tablet by mouth 2 (two) times daily.   CHOLECALCIFEROL (VITAMIN D ) 1000 UNITS  TABLET    Take 1 tablet (1,000 Units total) by mouth daily.   MULTIPLE VITAMIN (MULTIVITAMIN WITH MINERALS) TABS TABLET    Take 1 tablet by mouth 2 (two) times daily.   NEBULIZERS (COMPRESSOR/NEBULIZER) MISC    Use as direccted   OMEPRAZOLE  (PRILOSEC) 20 MG CAPSULE    Take 1 capsule (20 mg total) by mouth daily.   PRAVASTATIN  (PRAVACHOL ) 40 MG TABLET    Take 1 tablet by mouth daily.   SODIUM CHLORIDE  HYPERTONIC 3 % NEBULIZER SOLUTION    Take 3 mLs by nebulization 3 (three) times daily.  Modified Medications   Modified Medication Previous Medication   ALENDRONATE  (FOSAMAX ) 70 MG TABLET alendronate  (FOSAMAX ) 70 MG tablet      Take 1 tablet (70 mg total) by mouth every 7 (seven) days. Take with a full glass of water on an empty stomach.    Take 1 tablet (70 mg total) by mouth every 7 (seven) days. Take with a full glass of water on an empty stomach.  Discontinued Medications   No medications on file     Past Medical History Past Medical History:  Diagnosis Date   Allergy    Arthritis    Asthma    Chronic prostatitis    Myalgia    Nose injury, initial encounter 06/16/2020   Pulmonary infiltrates    Scrotal pain 01/08/2023   Sinusitis,  chronic    Tonsillar abscess    Vasculitis     Past Surgical History Past Surgical History:  Procedure Laterality Date   BRONCHOSCOPY  2011   TBBX ----inflammation   IR FLUORO GUIDE CV LINE RIGHT  01/31/2017   IR FLUORO GUIDE CV MIDLINE PICC RIGHT  01/30/2017   IR US  GUIDE VASC ACCESS RIGHT  01/30/2017   peritonsilar abscess     TYMPANOPLASTY     VIDEO BRONCHOSCOPY Bilateral 06/25/2018   Procedure: VIDEO BRONCHOSCOPY WITHOUT FLUORO;  Surgeon: Shellia Oh, MD;  Location: [facility];  Service: Cardiopulmonary;  Laterality: Bilateral;    Family History family history includes Heart attack in an other family member; Heart disease in his paternal grandfather; Hyperlipidemia in his father; Thyroid  disease in his father.  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  Other Topics Concern   Not on file  Social History Narrative   Not on file   Social Drivers of Health   Financial Resource Strain: Not on file  Food Insecurity: Not on file  Transportation Needs: Not on file  Physical Activity: Insufficiently Active (06/04/2023)   Exercise Vital Sign    Days of Exercise per Week: 7 days    Minutes of Exercise per Session: 20 min  Stress: Not on file  Social Connections: Not on file  Intimate Partner Violence: Not on file    Laboratory Investigations No components found for: CMP No components found for: BMP Lab Results  Component Value Date   GFR 89.06 08/01/2023   Lab Results  Component Value Date   CREATININE 1.09 06/08/2024   No results found for: CBC No components found for: LFT No components found for: VITD Lab Results  Component Value Date   PTH 18 03/13/2023   Lab Results  Component Value Date   TSH 2.930 06/08/2024   No components found for: RENAL FUNCTION No components found for: MAGNESIUM  Parts of this note may have been dictated using voice recognition software. There may be variances in spelling and vocabulary which are unintentional. Not all errors are proofread. Please notify the dino if any discrepancies are noted or if the meaning of any statement is not clear.  Physical Exam  Ht 6' 7 (2.007 m)   Wt 186 lb (84.4 kg)   BMI 20.95 kg/m     Constitutional: well developed, well nourished Head: normocephalic, atraumatic Eyes: sclera anicteric, no redness Neck: supple Lungs: normal respiratory effort Neurology: alert and oriented Skin: dry, no appreciable rashes Musculoskeletal: no appreciable defects Psychiatric: normal mood and affect   Current Medications Patient's Medications  New Prescriptions   No medications on file  Previous Medications   ALBUTEROL  (PROVENTIL ) (2.5 MG/3ML) 0.083% NEBULIZER SOLUTION    Inhale 1 vial via nebulizer every 6 hours as needed for wheezing.   ALBUTEROL  (VENTOLIN  HFA) 108 (90 BASE)  MCG/ACT INHALER    Inhale 2 puffs into the lungs every 6 (six) hours as needed for wheezing or shortness of breath.   BUDESONIDE -FORMOTEROL  (SYMBICORT ) 160-4.5 MCG/ACT INHALER    Inhale 2 puffs into the lungs 2 (two) times daily.   CALCIUM  PO    Take 1 tablet by mouth 2 (two) times daily.   CHOLECALCIFEROL (VITAMIN D ) 1000 UNITS TABLET    Take 1 tablet (1,000 Units total) by mouth daily.   MULTIPLE VITAMIN (MULTIVITAMIN WITH MINERALS) TABS TABLET    Take 1 tablet by mouth 2 (two) times daily.   NEBULIZERS (COMPRESSOR/NEBULIZER) MISC    Use as direccted   OMEPRAZOLE  (PRILOSEC) 20 MG CAPSULE    Take 1 capsule (20 mg total) by mouth daily.   PRAVASTATIN  (PRAVACHOL ) 40 MG TABLET    Take 1 tablet by mouth daily.   SODIUM CHLORIDE  HYPERTONIC 3 % NEBULIZER SOLUTION    Take 3 mLs by nebulization 3 (three) times daily.  Modified Medications   Modified Medication Previous Medication   ALENDRONATE  (FOSAMAX ) 70 MG TABLET alendronate  (FOSAMAX ) 70 MG tablet      Take 1 tablet (70 mg total) by mouth every 7 (seven) days. Take with a full glass of water on an empty stomach.    Take 1 tablet (70 mg total) by mouth every 7 (seven) days. Take with a full glass of water on an empty stomach.  Discontinued Medications   No medications on file    Allergies Allergies  Allergen Reactions   Gabapentin  Other (See Comments)     Tinnitis, and hearing loss   Lipitor [Atorvastatin ] Other (See Comments)    myalgias   Ruxience  [Rituximab -Pvvr] Itching    Itching in back of throat and nose     Past Medical History Past Medical History:  Diagnosis Date   Allergy    Arthritis    Asthma    Chronic prostatitis    Myalgia    Nose injury, initial encounter 06/16/2020   Pulmonary infiltrates    Scrotal pain 01/08/2023   Sinusitis, chronic    Tonsillar abscess    Vasculitis     Past Surgical History Past Surgical History:  Procedure Laterality Date   BRONCHOSCOPY  2011   TBBX ----inflammation   IR FLUORO GUIDE CV LINE RIGHT  01/31/2017   IR FLUORO GUIDE CV MIDLINE PICC RIGHT  01/30/2017   IR US  GUIDE VASC ACCESS RIGHT  01/30/2017   peritonsilar abscess     TYMPANOPLASTY     VIDEO BRONCHOSCOPY Bilateral 06/25/2018   Procedure: VIDEO BRONCHOSCOPY WITHOUT FLUORO;  Surgeon: Shellia Oh, MD;  Location: [facility];  Service: Cardiopulmonary;  Laterality: Bilateral;    Family History family history includes Heart attack in an other family member; Heart disease in his paternal grandfather; Hyperlipidemia in his father; Thyroid  disease in his father.  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  Other Topics Concern   Not on file  Social History Narrative   Not on file   Social Drivers of Health   Financial Resource Strain: Not on file  Food Insecurity: Not on file   Transportation Needs: Not on file  Physical Activity: Insufficiently Active (06/04/2023)   Exercise Vital Sign    Days of Exercise per Week: 7 days    Minutes of Exercise per Session: 20 min  Stress: Not on file  Social Connections: Not on file  Intimate Partner Violence: Not on file    Lab Results  Component Value Date   CHOL 184 06/08/2024   Lab Results  Component Value Date   HDL 40 06/08/2024   Lab Results  Component Value Date   LDLCALC 122 (H) 06/08/2024   Lab Results  Component Value Date   TRIG 119 06/08/2024   Lab Results  Component Value Date   CHOLHDL 4.6 06/08/2024   Lab Results  Component Value Date   CREATININE 1.09 06/08/2024   Lab Results  Component Value Date   GFR 89.06 08/01/2023      Component Value Date/Time   NA 139 06/08/2024 1204   K 4.3 06/08/2024 1204   CL 98 06/08/2024 1204   CL 95 02/03/2017 0000   CO2 25 06/08/2024 1204   CO2 25 02/03/2017 0000   GLUCOSE 79 06/08/2024 1204   GLUCOSE 159 (H) 08/01/2023 1431   BUN 15 06/08/2024 1204   CREATININE 1.09 06/08/2024 1204   CREATININE 0.90 10/24/2022 1337   CALCIUM  10.3 (H) 06/08/2024 1204   CALCIUM  9.7 02/03/2017 0000   PROT 7.4 06/08/2024 1204   ALBUMIN 4.9 06/08/2024 1204   AST 18 06/08/2024 1204   ALT 21 06/08/2024 1204   ALKPHOS 70 06/08/2024 1204   BILITOT 1.2 06/08/2024 1204   GFRNONAA >60 12/04/2020 0848   GFRNONAA 108 06/16/2020 1708   GFRAA 125 06/16/2020 1708      Latest Ref Rng & Units 06/08/2024   12:04 PM 08/01/2023    2:31 PM 06/04/2023    9:51 AM  BMP  Glucose 70 - 99 mg/dL 79  840  89   BUN 6 - 24 mg/dL 15  16  14    Creatinine 0.76 - 1.27 mg/dL 8.90  8.96  8.97   BUN/Creat Ratio 9 - 20 14   14    Sodium 134 - 144 mmol/L 139  140  140   Potassium 3.5 - 5.2 mmol/L 4.3  3.7  4.5   Chloride 96 - 106 mmol/L 98  102  101   CO2 20 - 29 mmol/L 25  30  26    Calcium  8.7 - 10.2 mg/dL 89.6  9.7  9.7        Component Value Date/Time   WBC 5.5 06/08/2024 1204    WBC 9.0 08/01/2023 1431   RBC 5.53 06/08/2024 1204   RBC 5.74 08/01/2023 1431   HGB 17.7 06/08/2024 1204   HCT 53.2 (H) 06/08/2024 1204   PLT 286 06/08/2024 1204   MCV 96 06/08/2024 1204   MCH 32.0 06/08/2024 1204   MCH 31.6 10/24/2022 1337   MCHC 33.3 06/08/2024 1204   MCHC 33.8 08/01/2023 1431   RDW 12.7 06/08/2024 1204   LYMPHSABS 1.3 08/15/2023 0847   MONOABS 0.2 08/01/2023 1431   EOSABS 0.3 08/15/2023 0847   BASOSABS 0.1 08/15/2023 0847   Lab Results  Component Value Date   TSH 2.930 06/08/2024   TSH  3.520 06/18/2023   TSH 3.75 04/12/2019   FREET4 1.31 06/18/2023         Parts of this note may have been dictated using voice recognition software. There may be variances in spelling and vocabulary which are unintentional. Not all errors are proofread. Please notify the dino if any discrepancies are noted or if the meaning of any statement is not clear.

## 2024-07-30 ENCOUNTER — Ambulatory Visit

## 2024-07-30 VITALS — BP 115/74 | HR 63 | Temp 97.6°F | Resp 18 | Ht 78.0 in | Wt 191.8 lb

## 2024-07-30 DIAGNOSIS — M317 Microscopic polyangiitis: Secondary | ICD-10-CM | POA: Diagnosis not present

## 2024-07-30 MED ORDER — METHYLPREDNISOLONE SODIUM SUCC 125 MG IJ SOLR
125.0000 mg | Freq: Once | INTRAMUSCULAR | Status: AC
  Administered 2024-07-30: 125 mg via INTRAVENOUS
  Filled 2024-07-30: qty 2

## 2024-07-30 MED ORDER — SODIUM CHLORIDE 0.9 % IV SOLN
1000.0000 mg | Freq: Once | INTRAVENOUS | Status: AC
  Administered 2024-07-30: 1000 mg via INTRAVENOUS
  Filled 2024-07-30: qty 100

## 2024-07-30 MED ORDER — DIPHENHYDRAMINE HCL 50 MG/ML IJ SOLN: 25.0000 mg | Freq: Once | INTRAMUSCULAR | Status: DC

## 2024-07-30 NOTE — Progress Notes (Signed)
 Diagnosis:   Microscopic polyangiitis    Provider:  Lonna Coder MD  Procedure: IV Infusion  IV Type: Peripheral, IV Location: L Antecubital  Truxima  (Rituximab -abbs), Dose: 1000 mg  Infusion Start Time: 0924  Infusion Stop Time: 1256  Post Infusion IV Care: Patient declined observation and Peripheral IV Discontinued  Discharge: Condition: Good, Destination: Home . AVS Declined  Performed by:  Leita FORBES Miles, LPN

## 2024-08-05 ENCOUNTER — Encounter: Payer: Self-pay | Admitting: Internal Medicine

## 2024-08-05 ENCOUNTER — Ambulatory Visit: Admitting: Internal Medicine

## 2024-08-05 VITALS — BP 115/79 | HR 85 | Temp 97.8°F | Ht 79.0 in | Wt 188.0 lb

## 2024-08-05 DIAGNOSIS — M317 Microscopic polyangiitis: Secondary | ICD-10-CM | POA: Diagnosis not present

## 2024-08-05 NOTE — Progress Notes (Signed)
 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 Presented 2011 with nodular infiltrates and progressive weakness/neuropathy. Bronchoscoped 2011- Neg.  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 Has been treated with Rituxan  anti-B Lymphocyte immune modulator and intermittent solumedrol/ prednisone  PFT 05/09/10- Mild restriction, normal flows, normal DLCO, insignificant response to bronchodilator. FVC 4.91/73%, FEV1 4.19/83%, FEV1/FVC 0.85, FEF 25-75% 0.85, TLC 78%, DLCO 113% PFT 06/08/2014-normal lung volumes, minimal slowing and small airway flows with response to bronchodilator, normal diffusion CXR 08/30/2014-Emphysematous and bronchitic changes consistent with COPD. Bronchoscopy 2019-Dr. Sood-BAL-normal flora.  Aspergillus Ag BAL 0.10 WNL, fungal cultures negative, AFB neg. cytology benign. PFT [facility], 03/10/2019 without interpretation- numerical results scanned to media. 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 Aortic Atherosclerosis,  Presented 2011 with nodular infiltrates and progressive weakness/neuropathy. Induced sputum> M. Chelonae. Parallel f/u with Dr Patel/ Pulmonary @[facility] - agrees with conservative plan for annual CT, holding bronch searching for NTM pending progression. Advised staying off macrolides in case needed for NTM in  future. -Symbicort , Albuterol  hfa, neb albuterol , ipratropium 0.06% nasal, rituxan , .hpi  Discussed the use of AI scribe software for clinical note transcription with the patient, who gave verbal consent to proceed.  History of Present Illness   The patient, with a history of asthma and a granulomatous angiitis managed with Rituxan  infusions, reports stable symptoms. He uses a rescue inhaler and Symbicort , a steroid-containing inhaler, as needed. He tries to minimize the use of the steroid inhaler due to past bone issues and concerns about reduced airway infection resistance. He reports that non-steroidal approaches have not been effective for him.  The patient continues to receive Rituxan  infusions every six months, which he reports keeps his disease at bay. Without Rituxan , his airways become inflamed. He has experienced various infusion reactions, but continues with the treatment as it has been the most effective among different immunosuppressants tried. His next infusion is scheduled for April.  The patient has been followed by a team at [facility], whom he sees approximately once a year. He is part of a research trial and receives the latest information on his disease from this team. The patient's last chest CT was in February of the previous year, and we discussed possibly doing another one this year, but decided to wait a year as long as he feels stable. We has also discussed possibly doing a pulmonary function test this year or next, but as he feels stable, he has decided to wait. We anticipate both studies in 2026.     Assessment and Plan:    Granulomatous microangiitis Stable with intermittent use of Symbicort . Rituxan  infusions every six months have been effective in managing symptoms, despite infusion reactions. Next infusion scheduled for April 2025. -Continue current management plan with Symbicort  and Rituxan . -Consider annual follow-up with Genetta Potters for research trial and latest  information.  General Health Maintenance Last chest CT was in February 2024, and pulmonary function test (PFT) has not been performed recently. However, patient is feeling stable. -Plan to perform chest CT and PFT in 2026, unless symptoms change. -Six-month follow-up appointment scheduled.    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 Aortic Atherosclerosis,  Presented 2011 with nodular infiltrates and progressive weakness/neuropathy. Induced sputum> M. Chelonae. Parallel f/u with Dr Patel/ Pulmonary @[facility] - agreed with conservative plan for annual CT, holding bronch searching for NTM pending progression. Advised staying off macrolides in case needed for NTM in future. -Symbicort , Albuterol  hfa, neb albuterol , ipratropium 0.06% nasal, rituxan , .  Discussed the use of AI scribe software for clinical note transcription with the patient, who gave verbal consent to proceed.  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 He is currently choosing to be followed here for Pulmonary, and by Rheumatology, rather than by [facility].  He receives Truxima  (Rituxan  biosimilar) every six months, effectively managing his symptoms and preventing pleural and lung surface issues. He currently feels well and experiences no adverse symptoms with maintained treatment. He is not using his inhaler at this time. Vaccinations, including flu shots, are up to date.     Assessment and Plan:    Vasculitis with pulmonary  involvement Vasculitis stable on Rituximab  maintenance. Asymptomatic, no inhaler use. - Continue Rituximab  every six months. - Ensure inhalers available. - Administer flu shots routinely. - Schedule PFT and CT scan in February, representing 2 year interval     Asthma moderate persistent uncomplicated -Doing well. Resume inhalers when needed                                                 ROS-see HPI + = positive Constitutional:   No-   weight loss, night sweats, fevers, chills, fatigue, lassitude. HEENT:   No-  headaches, difficulty swallowing, tooth/dental problems, sore throat,       No-  sneezing, itching, +ear ache,+ nasal congestion, post nasal drip,  CV:  No-   chest pain, orthopnea, PND, swelling in lower extremities, anasarca,                                                     dizziness, palpitations Resp: shortness of breath with exertion or at rest.              No-   productive cough,  + non-productive cough,  No- coughing up of blood.              No-   change in color of mucus.  + wheezing.   Skin: No-   rash or lesions. GI:  No-   heartburn, indigestion, abdominal pain, nausea, vomiting,  GU:  MS:  +  joint pain or swelling.   Neuro-     nothing unusual Psych:  No- change in mood or affect. No depression or anxiety.  No memory loss.  OBJ- Physical Exam General- Alert, Oriented, Affect-appropriate, Distress- none acute,+ tall, thin,  Skin- no rash Lymphadenopathy- none Head- atraumatic            Eyes- Gross vision intact, PERRLA, conjunctivae and secretions clear  Ears- Hearing, canals-normal            Nose- + marked turbinate edema, no-Septal dev, mucus, polyps, erosion, perforation             Throat- Mallampati II , mucosa clear , drainage- none, tonsils- atrophic Neck- flexible , trachea midline, no stridor , thyroid  nl, carotid no bruit Chest - symmetrical excursion , unlabored           Heart/CV- RRR , no murmur , no gallop  , no rub, nl s1 s2                            - JVD- none , edema- none, stasis changes- none, varices- none           Lung- + coarse / wet airway sounds limited now to left base, no- wheeze or fine rhonchi, unlabored,  cough-none , dullness-none, rub- none,            Chest wall-  Abd-  Br/ Gen/ Rectal- Not done, not indicated Extrem- cyanosis- none, clubbing, none, atrophy- none, strength- nl Neuro- grossly intact to observation

## 2024-08-05 NOTE — Patient Instructions (Signed)
 Order- schedule HRCT chest  February, 2026    dx microscopic polyangiitis  Order- schedule PFT   dx microscopic polyangiitis

## 2024-08-09 ENCOUNTER — Encounter: Payer: Self-pay | Admitting: Internal Medicine

## 2024-09-02 ENCOUNTER — Other Ambulatory Visit: Payer: Self-pay

## 2024-09-02 ENCOUNTER — Other Ambulatory Visit (HOSPITAL_COMMUNITY): Payer: Self-pay

## 2024-09-21 ENCOUNTER — Encounter: Payer: Self-pay | Admitting: Urology

## 2024-09-21 ENCOUNTER — Ambulatory Visit: Admitting: Urology

## 2024-09-21 ENCOUNTER — Ambulatory Visit (HOSPITAL_BASED_OUTPATIENT_CLINIC_OR_DEPARTMENT_OTHER)
Admission: RE | Admit: 2024-09-21 | Discharge: 2024-09-21 | Disposition: A | Source: Ambulatory Visit | Attending: Urology | Admitting: Urology

## 2024-09-21 VITALS — BP 126/81 | HR 58 | Ht 79.0 in | Wt 190.0 lb

## 2024-09-21 DIAGNOSIS — N503 Cyst of epididymis: Secondary | ICD-10-CM | POA: Diagnosis not present

## 2024-09-21 DIAGNOSIS — N5089 Other specified disorders of the male genital organs: Secondary | ICD-10-CM | POA: Diagnosis not present

## 2024-09-21 DIAGNOSIS — N433 Hydrocele, unspecified: Secondary | ICD-10-CM | POA: Diagnosis not present

## 2024-09-21 LAB — URINALYSIS, ROUTINE W REFLEX MICROSCOPIC
Bilirubin, UA: NEGATIVE
Glucose, UA: NEGATIVE
Ketones, UA: NEGATIVE
Leukocytes,UA: NEGATIVE
Nitrite, UA: NEGATIVE
Protein,UA: NEGATIVE
RBC, UA: NEGATIVE
Specific Gravity, UA: 1.025 (ref 1.005–1.030)
Urobilinogen, Ur: 0.2 mg/dL (ref 0.2–1.0)
pH, UA: 5.5 (ref 5.0–7.5)

## 2024-09-21 NOTE — Progress Notes (Signed)
 Assessment: 1. Testicular swelling     Plan: I personally reviewed the patient's chart including provider notes, lab and imaging results. Scrotal U/S for evaluation - will contact him with results He declined treatment for possible epididymitis today.  Chief Complaint: Chief Complaint  Patient presents with   Groin Swelling    HPI: Jesse Wong is a 43 y.o. male who presents for continued evaluation of right scrotal swelling and discomfort. He was previously seen by Dr. Shona in April 2024 for right scrotal swelling with associated discomfort.  At that time, he reported a several day history of right testicular swelling and discomfort.  Evaluation with scrotal ultrasound in March 2024 showed no evidence of torsion, normal testes bilaterally, moderate right hydrocele.  He was treated empirically with a 10-day course of Levaquin .  His pain resolved and his swelling improved significantly.  His exam was consistent with a small right hydrocele and small epididymal cyst.  He returns today for follow-up.  He has had some increased swelling and discomfort in the right scrotum for approximately 2 months.  He reports the discomfort as a dull ache.  He feels like the right scrotum has increased in size.  No scrotal erythema.  He is not having any lower urinary tract symptoms.  Portions of the above documentation were copied from a prior visit for review purposes only.  Allergies: Allergies  Allergen Reactions   Gabapentin  Other (See Comments)    Tinnitis, and hearing loss   Lipitor [Atorvastatin ] Other (See Comments)    myalgias   Ruxience  [Rituximab -Pvvr] Itching    Itching in back of throat and nose     PMH: Past Medical History:  Diagnosis Date   Allergy    Arthritis    Asthma    Chronic prostatitis    Myalgia    Nose injury, initial encounter 06/16/2020   Pulmonary infiltrates    Scrotal pain 01/08/2023   Sinusitis, chronic    Tonsillar abscess    Vasculitis      PSH: Past Surgical History:  Procedure Laterality Date   BRONCHOSCOPY  2011   TBBX ----inflammation   IR FLUORO GUIDE CV LINE RIGHT  01/31/2017   IR FLUORO GUIDE CV MIDLINE PICC RIGHT  01/30/2017   IR US  GUIDE VASC ACCESS RIGHT  01/30/2017   peritonsilar abscess     TYMPANOPLASTY     VIDEO BRONCHOSCOPY Bilateral 06/25/2018   Procedure: VIDEO BRONCHOSCOPY WITHOUT FLUORO;  Surgeon: Shellia Oh, MD;  Location: [facility];  Service: Cardiopulmonary;  Laterality: Bilateral;    SH: Social History   Tobacco Use   Smoking status: Never   Smokeless tobacco: Never  Vaping Use   Vaping status: Never Used  Substance Use Topics   Alcohol use: Yes    Comment: occasional   Drug use: No    ROS: Constitutional:  Negative for fever, chills, weight loss CV: Negative for chest pain, previous MI, hypertension Respiratory:  Negative for shortness of breath, wheezing, sleep apnea, frequent cough GI:  Negative for nausea, vomiting, bloody stool, GERD  PE: BP 126/81   Pulse (!) 58   Ht 6' 7 (2.007 m)   Wt 190 lb (86.2 kg)   BMI 21.40 kg/m  GENERAL APPEARANCE:  Well appearing, well developed, well nourished, NAD HEENT:  Atraumatic, normocephalic, oropharynx clear NECK:  Supple without lymphadenopathy or thyromegaly ABDOMEN:  Soft, non-tender, no masses EXTREMITIES:  Moves all extremities well, without clubbing, cyanosis, or edema NEUROLOGIC:  Alert and oriented x 3, normal  gait, CN II-XII grossly intact MENTAL STATUS:  appropriate BACK:  Non-tender to palpation, No CVAT SKIN:  Warm, dry, and intact GU: Scrotum: right hydrocele; no erythema or edema Testis: Right testis difficult to palpate due to hydrocele; no obvious mass; left NT, no mass Epididymis: some tenderness to palpation on right; left normal    Results: U/A: negative

## 2024-09-22 ENCOUNTER — Ambulatory Visit: Payer: Self-pay | Admitting: Urology

## 2024-10-04 DIAGNOSIS — L4059 Other psoriatic arthropathy: Secondary | ICD-10-CM | POA: Diagnosis not present

## 2024-10-04 DIAGNOSIS — B449 Aspergillosis, unspecified: Secondary | ICD-10-CM | POA: Diagnosis not present

## 2024-10-04 DIAGNOSIS — Z6822 Body mass index (BMI) 22.0-22.9, adult: Secondary | ICD-10-CM | POA: Diagnosis not present

## 2024-10-04 DIAGNOSIS — Z7952 Long term (current) use of systemic steroids: Secondary | ICD-10-CM | POA: Diagnosis not present

## 2024-10-04 DIAGNOSIS — M317 Microscopic polyangiitis: Secondary | ICD-10-CM | POA: Diagnosis not present

## 2024-10-06 ENCOUNTER — Other Ambulatory Visit (HOSPITAL_COMMUNITY): Payer: Self-pay | Admitting: Internal Medicine

## 2024-10-06 ENCOUNTER — Encounter: Payer: Self-pay | Admitting: Internal Medicine

## 2024-10-06 DIAGNOSIS — Z79899 Other long term (current) drug therapy: Secondary | ICD-10-CM | POA: Insufficient documentation

## 2024-10-06 DIAGNOSIS — Z111 Encounter for screening for respiratory tuberculosis: Secondary | ICD-10-CM | POA: Insufficient documentation

## 2024-11-01 ENCOUNTER — Encounter: Payer: Self-pay | Admitting: Family Medicine

## 2024-11-08 ENCOUNTER — Ambulatory Visit (HOSPITAL_BASED_OUTPATIENT_CLINIC_OR_DEPARTMENT_OTHER)

## 2024-11-13 ENCOUNTER — Other Ambulatory Visit (HOSPITAL_BASED_OUTPATIENT_CLINIC_OR_DEPARTMENT_OTHER): Payer: Self-pay

## 2024-11-13 ENCOUNTER — Telehealth: Admitting: Family Medicine

## 2024-11-13 DIAGNOSIS — L02219 Cutaneous abscess of trunk, unspecified: Secondary | ICD-10-CM | POA: Diagnosis not present

## 2024-11-13 MED ORDER — CEPHALEXIN 500 MG PO CAPS
500.0000 mg | ORAL_CAPSULE | Freq: Three times a day (TID) | ORAL | 0 refills | Status: AC
  Filled 2024-11-13: qty 21, 7d supply, fill #0

## 2024-11-13 NOTE — Progress Notes (Signed)
 " Virtual Visit Consent   Jesse Wong, you are scheduled for a virtual visit with a [facility] provider today. Just as with appointments in the office, your consent must be obtained to participate. Your consent will be active for this visit and any virtual visit you may have with one of our providers in the next 365 days. If you have a MyChart account, a copy of this consent can be sent to you electronically.  As this is a virtual visit, video technology does not allow for your provider to perform a traditional examination. This may limit your provider's ability to fully assess your condition. If your provider identifies any concerns that need to be evaluated in person or the need to arrange testing (such as labs, EKG, etc.), we will make arrangements to do so. Although advances in technology are sophisticated, we cannot ensure that it will always work on either your end or our end. If the connection with a video visit is poor, the visit may have to be switched to a telephone visit. With either a video or telephone visit, we are not always able to ensure that we have a secure connection.  By engaging in this virtual visit, you consent to the provision of healthcare and authorize for your insurance to be billed (if applicable) for the services provided during this visit. Depending on your insurance coverage, you may receive a charge related to this service.  I need to obtain your verbal consent now. Are you willing to proceed with your visit today? Jesse Wong has provided verbal consent on 11/13/2024 for a virtual visit (video or telephone). Jesse Lamp, FNP  Date: 11/13/2024 11:39 AM   Virtual Visit via Video Note   I, Jesse Wong, connected with  Jesse Wong  ([id number], 1945/08/03) on 11/13/24 at 11:30 AM EST by a video-enabled telemedicine application and verified that I am speaking with the correct person using two identifiers.  Location: Patient: Virtual Visit Location  Patient: Home Provider: Virtual Visit Location Provider: Home Office   I discussed the limitations of evaluation and management by telemedicine and the availability of in person appointments. The patient expressed understanding and agreed to proceed.    History of Present Illness: Jesse Wong is a 44 y.o. who identifies as a male who was assigned male at birth, and is being seen today for abscess the sive of the end of finger in the pubic area, not drainage, red and puffy. No fever. He is immunocompromised and concerned about infection with storm coming. 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   Long term current use of systemic steroids 05/17/2021   Closed fracture of nasal bones 06/19/2020   Coccygeal pain 06/16/2020   Atherosclerosis of aorta 04/12/2019   Pulmonary infiltrates    Medication monitoring encounter 03/03/2018   Pulmonary nodule seen on imaging study 01/15/2018   Microscopic polyangiitis (HCC) 03/14/2016   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  Resting comfortably  at home.  Head is normocephalic, atraumatic.  No  labored breathing.  Speech is clear and coherent with logical content.  Patient is alert and oriented at baseline.    Assessment and Plan: 1. Abscess of pubic region (Primary)  Warm compressesd, UC if sx worsen.   Follow Up Instructions: I discussed the assessment and treatment plan with the patient. The patient was provided an opportunity to ask questions and all were answered. The patient agreed with the plan and demonstrated an understanding of the instructions.  A copy of instructions were sent to the patient via MyChart unless otherwise noted below.     The patient was advised to call back or seek an in-person evaluation if the symptoms worsen or if the condition fails to improve as anticipated.    Jesse Petti, FNP     [1]  Allergies Allergen Reactions   Gabapentin  Other (See Comments)    Tinnitis, and hearing loss   Lipitor [Atorvastatin ] Other (See Comments)    myalgias   Ruxience  [Rituximab -Pvvr] Itching    Itching in back of throat and nose   [2]  Current Outpatient Medications:    albuterol  (PROVENTIL ) (2.5 MG/3ML) 0.083% nebulizer solution, Inhale 1 vial via nebulizer every 6 hours as needed for wheezing., Disp: 180 mL, Rfl: 11   albuterol  (VENTOLIN  HFA) 108 (90 Base) MCG/ACT inhaler, Inhale 2 puffs into the lungs every 6 (six) hours as needed for wheezing or shortness of breath., Disp: 18 g, Rfl: 5   alendronate  (FOSAMAX ) 70 MG tablet, Take 1 tablet (70 mg total) by mouth every 7 (seven) days. Take with a full glass of water on an empty stomach., Disp: 12 tablet, Rfl: 3   budesonide -formoterol  (SYMBICORT ) 160-4.5 MCG/ACT inhaler, Inhale 2 puffs into the lungs 2 (two) times daily., Disp: 10.2 g, Rfl: 12   CALCIUM  PO, Take 1 tablet by mouth 2 (two) times daily., Disp: , Rfl:    cholecalciferol (VITAMIN D ) 1000 UNITS tablet, Take 1 tablet (1,000 Units total) by mouth daily., Disp: 30 tablet, Rfl: 6   Multiple Vitamin (MULTIVITAMIN WITH MINERALS) TABS tablet, Take 1  tablet by mouth 2 (two) times daily., Disp: , Rfl:    Nebulizers (COMPRESSOR/NEBULIZER) MISC, Use as direccted, Disp: 1 each, Rfl: 0   omeprazole  (PRILOSEC) 20 MG capsule, Take 1 capsule (20 mg total) by mouth daily., Disp: 90 capsule, Rfl: 0   pravastatin  (PRAVACHOL ) 40 MG tablet, Take 1 tablet by mouth daily., Disp: 90 tablet, Rfl: 3   sodium chloride  HYPERTONIC 3 % nebulizer solution, Take 3 mLs by nebulization 3 (three) times daily., Disp: 750 mL, Rfl: 12  Current Facility-Administered Medications:    diphenhydrAMINE  (BENADRYL ) injection 50 mg, 50 mg, Intramuscular, Once PRN, Causey, Morna Pickle, NP   EPINEPHrine  (EPI-PEN) injection 0.3 mg, 0.3 mg, Intramuscular, Once PRN, Causey, Morna Pickle, NP  "

## 2024-11-13 NOTE — Patient Instructions (Signed)
 Skin Abscess  A skin abscess is an infected area on or under your skin. It contains pus and other material. An abscess may also be called a furuncle, carbuncle, or boil. It is often the result of an infection caused by bacteria. An abscess can occur in or on almost any part of your body. Sometimes, an abscess may break open (rupture) on its own. In most cases, it will keep getting worse unless it is treated. An abscess can cause pain and make you feel ill. An untreated abscess can cause infection to spread to other parts of your body or your bloodstream. The abscess may need to be drained. You may also need to take antibiotics. What are the causes? An abscess occurs when germs, like bacteria, pass through your skin and cause an infection. This may be caused by: A scrape or cut on your skin. A puncture wound through your skin, such as a needle injection or insect bite. Blocked oil or sweat glands. Blocked and infected hair follicles. A fluid-filled sac that forms beneath your skin (sebaceous cyst) and becomes infected. What increases the risk? You may be more likely to develop an abscess if: You have problems with blood circulation, or you have a weak body defense system (immune system). You have diabetes. You have dry and irritated skin. You get injections often or use IV drugs. You have a foreign body in a wound, such as a splinter. You smoke or use tobacco products. What are the signs or symptoms? Symptoms of this condition include: A painful, firm bump under the skin. A bump with pus at the top. This may break through the skin and drain. Other symptoms include: Redness and swelling around the abscess. Warmth or tenderness. Swelling of the lymph nodes (glands) near the abscess. A sore on the skin. How is this diagnosed? This condition may be diagnosed based on a physical exam and your medical history. You may also have tests done, such as: A test of a sample of pus. This may be done  to find what is causing the infection. Blood tests. Imaging tests, such as an ultrasound, CT scan, or MRI. How is this treated? A small abscess that drains on its own may not need to be treated. Treatment for larger abscesses may include: Moist heat or a heat pack applied to the area a few times a day. Incision and drainage. This is a procedure to drain the abscess. Antibiotics. For a severe abscess, you may first get antibiotics through an IV and then change to antibiotics by mouth. Follow these instructions at home: Medicines Take over-the-counter and prescription medicines only as told by your provider. If you were prescribed antibiotics, take them as told by your provider. Do not stop using the antibiotic even if you start to feel better. Abscess care  If you have an abscess that has not drained, apply heat to the affected area. Use the heat source that your provider recommends, such as a moist heat pack or a heating pad. Place a towel between your skin and the heat source. Leave the heat on for 20-30 minutes at a time. If your skin turns bright red, remove the heat right away to prevent burns. The risk of burns is higher if you cannot feel pain, heat, or cold. Follow instructions from your provider about how to take care of your abscess. Make sure you: Cover the abscess with a bandage (dressing). Wash your hands with soap and water for at least 20 seconds before  and after you change the dressing or gauze. If soap and water are not available, use hand sanitizer. Change your dressing or gauze as told by your provider. Check your abscess every day for signs of an infection that is getting worse. Check for: More redness, swelling, pain, or tenderness. More fluid or blood. Warmth. More pus or a worse smell. General instructions To avoid spreading the infection: Do not share personal care items, towels, or hot tubs with others. Avoid making skin contact with other people. Be careful  when getting rid of used dressings, wound packing, or any drainage from the abscess. Do not use any products that contain nicotine or tobacco. These products include cigarettes, chewing tobacco, and vaping devices, such as e-cigarettes. If you need help quitting, ask your provider. Do not use any creams, ointments, or liquids unless you have been told to by your provider. Contact a health care provider if: You see redness that spreads quickly or red streaks on your skin spreading away from the abscess. You have any signs of worse infection at the abscess. You vomit every time you eat or drink. You have a fever, chills, or muscle aches. The cyst or abscess returns. Get help right away if: You have severe pain. You make less pee (urine) than normal. This information is not intended to replace advice given to you by your health care provider. Make sure you discuss any questions you have with your health care provider. Document Revised: 05/22/2022 Document Reviewed: 05/22/2022 Elsevier Patient Education  2024 [employer].

## 2024-12-07 ENCOUNTER — Ambulatory Visit: Admitting: Pulmonary Disease

## 2024-12-07 ENCOUNTER — Encounter

## 2024-12-20 ENCOUNTER — Ambulatory Visit

## 2025-01-28 ENCOUNTER — Ambulatory Visit
